# Patient Record
Sex: Male | Born: 1935 | Race: Black or African American | Hispanic: No | Smoking: Former smoker
Health system: Southern US, Community
[De-identification: ages and names within clinical notes are randomized; demographics above are authoritative.]

## PROBLEM LIST (undated history)

## (undated) DIAGNOSIS — E785 Hyperlipidemia, unspecified: Secondary | ICD-10-CM

## (undated) DIAGNOSIS — F0390 Unspecified dementia without behavioral disturbance: Secondary | ICD-10-CM

## (undated) DIAGNOSIS — IMO0002 Reserved for concepts with insufficient information to code with codable children: Secondary | ICD-10-CM

## (undated) DIAGNOSIS — K56609 Unspecified intestinal obstruction, unspecified as to partial versus complete obstruction: Secondary | ICD-10-CM

## (undated) DIAGNOSIS — N209 Urinary calculus, unspecified: Secondary | ICD-10-CM

## (undated) DIAGNOSIS — C61 Malignant neoplasm of prostate: Secondary | ICD-10-CM

## (undated) DIAGNOSIS — M1990 Unspecified osteoarthritis, unspecified site: Secondary | ICD-10-CM

## (undated) DIAGNOSIS — Z9889 Other specified postprocedural states: Secondary | ICD-10-CM

## (undated) DIAGNOSIS — I10 Essential (primary) hypertension: Secondary | ICD-10-CM

## (undated) DIAGNOSIS — J189 Pneumonia, unspecified organism: Secondary | ICD-10-CM

## (undated) DIAGNOSIS — C189 Malignant neoplasm of colon, unspecified: Secondary | ICD-10-CM

## (undated) DIAGNOSIS — I5022 Chronic systolic (congestive) heart failure: Secondary | ICD-10-CM

## (undated) DIAGNOSIS — F418 Other specified anxiety disorders: Secondary | ICD-10-CM

## (undated) DIAGNOSIS — H9190 Unspecified hearing loss, unspecified ear: Secondary | ICD-10-CM

## (undated) HISTORY — DX: Hyperlipidemia, unspecified: E78.5

## (undated) HISTORY — PX: CATARACT EXTRACTION, BILATERAL: SHX1313

## (undated) HISTORY — DX: Urinary calculus, unspecified: N20.9

## (undated) HISTORY — DX: Other specified anxiety disorders: F41.8

## (undated) HISTORY — PX: TRANSURETHRAL RESECTION OF PROSTATE: SHX73

## (undated) HISTORY — DX: Unspecified dementia, unspecified severity, without behavioral disturbance, psychotic disturbance, mood disturbance, and anxiety: F03.90

## (undated) HISTORY — DX: Unspecified osteoarthritis, unspecified site: M19.90

## (undated) HISTORY — DX: Unspecified hearing loss, unspecified ear: H91.90

## (undated) HISTORY — DX: Malignant neoplasm of prostate: C61

## (undated) HISTORY — DX: Reserved for concepts with insufficient information to code with codable children: IMO0002

## (undated) HISTORY — DX: Other specified postprocedural states: Z98.890

## (undated) HISTORY — DX: Malignant neoplasm of colon, unspecified: C18.9

## (undated) HISTORY — DX: Essential (primary) hypertension: I10

## (undated) HISTORY — DX: Chronic systolic (congestive) heart failure: I50.22

## (undated) HISTORY — PX: OTHER SURGICAL HISTORY: SHX169

---

## 1999-09-21 ENCOUNTER — Encounter: Admission: RE | Admit: 1999-09-21 | Discharge: 1999-09-21 | Payer: Self-pay | Admitting: Urology

## 1999-09-21 ENCOUNTER — Encounter: Payer: Self-pay | Admitting: Urology

## 1999-10-02 ENCOUNTER — Encounter: Admission: RE | Admit: 1999-10-02 | Discharge: 1999-12-31 | Payer: Self-pay | Admitting: Radiation Oncology

## 2002-07-21 ENCOUNTER — Encounter: Admission: RE | Admit: 2002-07-21 | Discharge: 2002-07-21 | Payer: Self-pay | Admitting: Internal Medicine

## 2002-07-21 ENCOUNTER — Encounter: Payer: Self-pay | Admitting: Internal Medicine

## 2004-03-02 ENCOUNTER — Encounter: Admission: RE | Admit: 2004-03-02 | Discharge: 2004-03-02 | Payer: Self-pay | Admitting: Urology

## 2004-03-07 ENCOUNTER — Ambulatory Visit (HOSPITAL_BASED_OUTPATIENT_CLINIC_OR_DEPARTMENT_OTHER): Admission: RE | Admit: 2004-03-07 | Discharge: 2004-03-07 | Payer: Self-pay | Admitting: Urology

## 2004-03-07 ENCOUNTER — Ambulatory Visit (HOSPITAL_COMMUNITY): Admission: RE | Admit: 2004-03-07 | Discharge: 2004-03-07 | Payer: Self-pay | Admitting: Urology

## 2005-03-06 ENCOUNTER — Encounter: Admission: RE | Admit: 2005-03-06 | Discharge: 2005-03-06 | Payer: Self-pay | Admitting: Gastroenterology

## 2005-03-08 ENCOUNTER — Emergency Department (HOSPITAL_COMMUNITY): Admission: EM | Admit: 2005-03-08 | Discharge: 2005-03-08 | Payer: Self-pay | Admitting: Emergency Medicine

## 2005-03-15 ENCOUNTER — Ambulatory Visit: Payer: Self-pay | Admitting: Oncology

## 2005-03-16 ENCOUNTER — Ambulatory Visit (HOSPITAL_COMMUNITY): Admission: RE | Admit: 2005-03-16 | Discharge: 2005-03-16 | Payer: Self-pay | Admitting: Gastroenterology

## 2005-03-30 HISTORY — PX: HEMICOLECTOMY: SHX854

## 2005-04-03 ENCOUNTER — Encounter (INDEPENDENT_AMBULATORY_CARE_PROVIDER_SITE_OTHER): Payer: Self-pay | Admitting: *Deleted

## 2005-04-03 ENCOUNTER — Inpatient Hospital Stay (HOSPITAL_COMMUNITY): Admission: RE | Admit: 2005-04-03 | Discharge: 2005-04-11 | Payer: Self-pay | Admitting: *Deleted

## 2005-04-16 ENCOUNTER — Ambulatory Visit (HOSPITAL_COMMUNITY): Admission: RE | Admit: 2005-04-16 | Discharge: 2005-04-16 | Payer: Self-pay | Admitting: Family Medicine

## 2005-05-17 ENCOUNTER — Ambulatory Visit: Payer: Self-pay | Admitting: Oncology

## 2005-07-30 ENCOUNTER — Ambulatory Visit (HOSPITAL_COMMUNITY): Admission: RE | Admit: 2005-07-30 | Discharge: 2005-07-30 | Payer: Self-pay | Admitting: Oncology

## 2005-08-09 ENCOUNTER — Ambulatory Visit: Payer: Self-pay | Admitting: Oncology

## 2005-08-10 LAB — COMPREHENSIVE METABOLIC PANEL
AST: 31 U/L (ref 0–37)
Albumin: 4 g/dL (ref 3.5–5.2)
BUN: 19 mg/dL (ref 6–23)
Calcium: 9 mg/dL (ref 8.4–10.5)
Chloride: 107 mEq/L (ref 96–112)
Potassium: 4.6 mEq/L (ref 3.5–5.3)

## 2005-08-10 LAB — CBC WITH DIFFERENTIAL/PLATELET
Basophils Absolute: 0 10*3/uL (ref 0.0–0.1)
EOS%: 6.3 % (ref 0.0–7.0)
Eosinophils Absolute: 0.3 10*3/uL (ref 0.0–0.5)
HGB: 13.5 g/dL (ref 13.0–17.1)
MCH: 27.1 pg — ABNORMAL LOW (ref 28.0–33.4)
NEUT#: 3.2 10*3/uL (ref 1.5–6.5)
RDW: 20.5 % — ABNORMAL HIGH (ref 11.2–14.6)
WBC: 5 10*3/uL (ref 4.0–10.0)
lymph#: 0.7 10*3/uL — ABNORMAL LOW (ref 0.9–3.3)

## 2005-11-02 ENCOUNTER — Ambulatory Visit: Payer: Self-pay | Admitting: Oncology

## 2005-11-09 LAB — CBC WITH DIFFERENTIAL/PLATELET
Basophils Absolute: 0.1 10*3/uL (ref 0.0–0.1)
EOS%: 6 % (ref 0.0–7.0)
Eosinophils Absolute: 0.2 10*3/uL (ref 0.0–0.5)
HGB: 13.9 g/dL (ref 13.0–17.1)
MCH: 28.9 pg (ref 28.0–33.4)
MCV: 85.8 fL (ref 81.6–98.0)
MONO%: 14.4 % — ABNORMAL HIGH (ref 0.0–13.0)
NEUT#: 2 10*3/uL (ref 1.5–6.5)
RBC: 4.8 10*6/uL (ref 4.20–5.71)
RDW: 15.3 % — ABNORMAL HIGH (ref 11.2–14.6)
lymph#: 0.9 10*3/uL (ref 0.9–3.3)

## 2005-11-09 LAB — COMPREHENSIVE METABOLIC PANEL
Albumin: 3.9 g/dL (ref 3.5–5.2)
BUN: 16 mg/dL (ref 6–23)
CO2: 23 mEq/L (ref 19–32)
Calcium: 8.1 mg/dL — ABNORMAL LOW (ref 8.4–10.5)
Chloride: 111 mEq/L (ref 96–112)
Glucose, Bld: 93 mg/dL (ref 70–99)
Potassium: 4.1 mEq/L (ref 3.5–5.3)

## 2006-02-12 ENCOUNTER — Ambulatory Visit (HOSPITAL_COMMUNITY): Admission: RE | Admit: 2006-02-12 | Discharge: 2006-02-12 | Payer: Self-pay | Admitting: Oncology

## 2006-02-25 ENCOUNTER — Ambulatory Visit: Payer: Self-pay | Admitting: Oncology

## 2006-03-07 LAB — COMPREHENSIVE METABOLIC PANEL
AST: 38 U/L — ABNORMAL HIGH (ref 0–37)
Albumin: 3.8 g/dL (ref 3.5–5.2)
BUN: 18 mg/dL (ref 6–23)
CO2: 23 mEq/L (ref 19–32)
Calcium: 9 mg/dL (ref 8.4–10.5)
Chloride: 109 mEq/L (ref 96–112)
Creatinine, Ser: 0.94 mg/dL (ref 0.40–1.50)
Glucose, Bld: 91 mg/dL (ref 70–99)
Potassium: 4.1 mEq/L (ref 3.5–5.3)

## 2006-03-07 LAB — CBC WITH DIFFERENTIAL/PLATELET
Basophils Absolute: 0 10*3/uL (ref 0.0–0.1)
Eosinophils Absolute: 0.3 10*3/uL (ref 0.0–0.5)
HCT: 42.9 % (ref 38.7–49.9)
HGB: 14.6 g/dL (ref 13.0–17.1)
MCH: 30 pg (ref 28.0–33.4)
MONO#: 0.8 10*3/uL (ref 0.1–0.9)
NEUT#: 2.6 10*3/uL (ref 1.5–6.5)
NEUT%: 58.4 % (ref 40.0–75.0)
RDW: 15.4 % — ABNORMAL HIGH (ref 11.2–14.6)
WBC: 4.4 10*3/uL (ref 4.0–10.0)
lymph#: 0.7 10*3/uL — ABNORMAL LOW (ref 0.9–3.3)

## 2006-03-07 LAB — CEA: CEA: <0.5 ng/mL (ref 0.0–5.0)

## 2006-03-26 ENCOUNTER — Encounter: Payer: Self-pay | Admitting: Internal Medicine

## 2006-05-13 ENCOUNTER — Ambulatory Visit: Payer: Self-pay | Admitting: Internal Medicine

## 2006-05-13 DIAGNOSIS — I10 Essential (primary) hypertension: Secondary | ICD-10-CM

## 2006-05-13 DIAGNOSIS — E1165 Type 2 diabetes mellitus with hyperglycemia: Secondary | ICD-10-CM

## 2006-05-13 DIAGNOSIS — Z87442 Personal history of urinary calculi: Secondary | ICD-10-CM

## 2006-05-13 DIAGNOSIS — C61 Malignant neoplasm of prostate: Secondary | ICD-10-CM

## 2006-05-13 DIAGNOSIS — E785 Hyperlipidemia, unspecified: Secondary | ICD-10-CM

## 2006-05-13 DIAGNOSIS — C189 Malignant neoplasm of colon, unspecified: Secondary | ICD-10-CM | POA: Insufficient documentation

## 2006-06-06 ENCOUNTER — Ambulatory Visit: Payer: Self-pay | Admitting: Oncology

## 2006-06-11 ENCOUNTER — Encounter: Payer: Self-pay | Admitting: Internal Medicine

## 2006-06-11 LAB — COMPREHENSIVE METABOLIC PANEL
ALT: 43 U/L (ref 0–53)
AST: 32 U/L (ref 0–37)
Albumin: 3.9 g/dL (ref 3.5–5.2)
Alkaline Phosphatase: 139 U/L — ABNORMAL HIGH (ref 39–117)
BUN: 19 mg/dL (ref 6–23)
CO2: 25 mEq/L (ref 19–32)
Calcium: 8.7 mg/dL (ref 8.4–10.5)
Chloride: 107 mEq/L (ref 96–112)
Creatinine, Ser: 0.91 mg/dL (ref 0.40–1.50)
Glucose, Bld: 121 mg/dL — ABNORMAL HIGH (ref 70–99)
Potassium: 4.4 mEq/L (ref 3.5–5.3)
Sodium: 140 mEq/L (ref 135–145)
Total Bilirubin: 0.5 mg/dL (ref 0.3–1.2)
Total Protein: 6.5 g/dL (ref 6.0–8.3)

## 2006-06-11 LAB — CBC WITH DIFFERENTIAL/PLATELET
BASO%: 1.2 % (ref 0.0–2.0)
Basophils Absolute: 0 10*3/uL (ref 0.0–0.1)
EOS%: 6.2 % (ref 0.0–7.0)
Eosinophils Absolute: 0.2 10*3/uL (ref 0.0–0.5)
HCT: 42.9 % (ref 38.7–49.9)
HGB: 14.5 g/dL (ref 13.0–17.1)
LYMPH%: 15.3 % (ref 14.0–48.0)
MCH: 29.5 pg (ref 28.0–33.4)
MCHC: 33.8 g/dL (ref 32.0–35.9)
MCV: 87.2 fL (ref 81.6–98.0)
MONO#: 0.6 10*3/uL (ref 0.1–0.9)
MONO%: 14.7 % — ABNORMAL HIGH (ref 0.0–13.0)
NEUT#: 2.4 10*3/uL (ref 1.5–6.5)
NEUT%: 62.7 % (ref 40.0–75.0)
Platelets: 196 10*3/uL (ref 145–400)
RBC: 4.93 10*6/uL (ref 4.20–5.71)
RDW: 12.7 % (ref 11.2–14.6)
WBC: 3.8 10*3/uL — ABNORMAL LOW (ref 4.0–10.0)
lymph#: 0.6 10*3/uL — ABNORMAL LOW (ref 0.9–3.3)

## 2006-06-11 LAB — CEA: CEA: 0.8 ng/mL (ref 0.0–5.0)

## 2006-07-12 ENCOUNTER — Ambulatory Visit: Payer: Self-pay | Admitting: Internal Medicine

## 2006-07-12 LAB — CONVERTED CEMR LAB
ALT: 36 units/L (ref 0–40)
AST: 36 units/L (ref 0–37)
BUN: 17 mg/dL (ref 6–23)
Basophils Absolute: 0 10*3/uL (ref 0.0–0.1)
Basophils Relative: 0.1 % (ref 0.0–1.0)
Calcium: 8.5 mg/dL (ref 8.4–10.5)
Creatinine, Ser: 0.9 mg/dL (ref 0.4–1.5)
Creatinine,U: 160.8 mg/dL
Eosinophils Absolute: 0.3 10*3/uL (ref 0.0–0.6)
Eosinophils Relative: 7.5 % — ABNORMAL HIGH (ref 0.0–5.0)
HCT: 42.2 % (ref 39.0–52.0)
Hemoglobin: 14.2 g/dL (ref 13.0–17.0)
Hgb A1c MFr Bld: 7 % — ABNORMAL HIGH (ref 4.6–6.0)
Lymphocytes Relative: 15.8 % (ref 12.0–46.0)
MCHC: 33.7 g/dL (ref 30.0–36.0)
MCV: 88.2 fL (ref 78.0–100.0)
Microalb Creat Ratio: 26.7 mg/g (ref 0.0–30.0)
Microalb, Ur: 4.3 mg/dL — ABNORMAL HIGH (ref 0.0–1.9)
Monocytes Absolute: 0.7 10*3/uL (ref 0.2–0.7)
Monocytes Relative: 14.6 % — ABNORMAL HIGH (ref 3.0–11.0)
Neutro Abs: 2.9 10*3/uL (ref 1.4–7.7)
Neutrophils Relative %: 62 % (ref 43.0–77.0)
PSA: 0.24 ng/mL (ref 0.10–4.00)
Platelets: 205 10*3/uL (ref 150–400)
Potassium: 4 meq/L (ref 3.5–5.1)
RBC: 4.79 M/uL (ref 4.22–5.81)
RDW: 14 % (ref 11.5–14.6)
WBC: 4.6 10*3/uL (ref 4.5–10.5)

## 2006-09-05 ENCOUNTER — Ambulatory Visit (HOSPITAL_COMMUNITY): Admission: RE | Admit: 2006-09-05 | Discharge: 2006-09-05 | Payer: Self-pay | Admitting: Oncology

## 2006-09-18 ENCOUNTER — Ambulatory Visit: Payer: Self-pay | Admitting: Oncology

## 2006-09-20 ENCOUNTER — Encounter: Payer: Self-pay | Admitting: Internal Medicine

## 2006-09-20 LAB — COMPREHENSIVE METABOLIC PANEL
ALT: 45 U/L (ref 0–53)
Albumin: 4 g/dL (ref 3.5–5.2)
CO2: 23 mEq/L (ref 19–32)
Chloride: 110 mEq/L (ref 96–112)
Potassium: 4.1 mEq/L (ref 3.5–5.3)
Sodium: 143 mEq/L (ref 135–145)
Total Bilirubin: 0.5 mg/dL (ref 0.3–1.2)
Total Protein: 6.6 g/dL (ref 6.0–8.3)

## 2006-09-20 LAB — CBC WITH DIFFERENTIAL/PLATELET
BASO%: 0.4 % (ref 0.0–2.0)
LYMPH%: 16.5 % (ref 14.0–48.0)
MCHC: 34.6 g/dL (ref 32.0–35.9)
MONO#: 0.7 10*3/uL (ref 0.1–0.9)
NEUT#: 2.5 10*3/uL (ref 1.5–6.5)
RBC: 4.81 10*6/uL (ref 4.20–5.71)
RDW: 14.4 % (ref 11.2–14.6)
WBC: 4.3 10*3/uL (ref 4.0–10.0)
lymph#: 0.7 10*3/uL — ABNORMAL LOW (ref 0.9–3.3)

## 2006-09-20 LAB — CEA: CEA: 0.8 ng/mL (ref 0.0–5.0)

## 2006-10-21 ENCOUNTER — Ambulatory Visit: Payer: Self-pay | Admitting: Internal Medicine

## 2006-10-22 ENCOUNTER — Ambulatory Visit: Payer: Self-pay | Admitting: Internal Medicine

## 2006-10-25 ENCOUNTER — Encounter (INDEPENDENT_AMBULATORY_CARE_PROVIDER_SITE_OTHER): Payer: Self-pay | Admitting: *Deleted

## 2006-10-25 LAB — CONVERTED CEMR LAB
ALT: 53 units/L — ABNORMAL HIGH (ref 0–40)
AST: 40 units/L — ABNORMAL HIGH (ref 0–37)
Albumin: 3.3 g/dL — ABNORMAL LOW (ref 3.5–5.2)
Alkaline Phosphatase: 135 units/L — ABNORMAL HIGH (ref 39–117)
Bilirubin, Direct: 0.1 mg/dL (ref 0.0–0.3)
Cholesterol: 102 mg/dL (ref 0–200)
Creatinine,U: 130.1 mg/dL
HDL: 38.9 mg/dL — ABNORMAL LOW (ref >39.0)
Hgb A1c MFr Bld: 7.6 % — ABNORMAL HIGH (ref 4.6–6.0)
LDL Cholesterol: 55 mg/dL (ref 0–99)
Microalb Creat Ratio: 55.3 mg/g — ABNORMAL HIGH (ref 0.0–30.0)
Microalb, Ur: 7.2 mg/dL — ABNORMAL HIGH (ref 0.0–1.9)
Total Bilirubin: 0.6 mg/dL (ref 0.3–1.2)
Total CHOL/HDL Ratio: 2.6
Total Protein: 6.4 g/dL (ref 6.0–8.3)
Triglycerides: 42 mg/dL (ref 0–149)
VLDL: 8 mg/dL (ref 0–40)

## 2006-11-04 ENCOUNTER — Encounter: Payer: Self-pay | Admitting: Internal Medicine

## 2006-11-04 ENCOUNTER — Ambulatory Visit: Payer: Self-pay | Admitting: Gastroenterology

## 2006-11-07 ENCOUNTER — Encounter (INDEPENDENT_AMBULATORY_CARE_PROVIDER_SITE_OTHER): Payer: Self-pay | Admitting: *Deleted

## 2006-12-09 ENCOUNTER — Ambulatory Visit: Payer: Self-pay | Admitting: Oncology

## 2006-12-20 ENCOUNTER — Ambulatory Visit: Payer: Self-pay | Admitting: Internal Medicine

## 2006-12-20 DIAGNOSIS — R945 Abnormal results of liver function studies: Secondary | ICD-10-CM

## 2006-12-24 LAB — CONVERTED CEMR LAB
ALT: 36 units/L (ref 0–53)
AST: 38 units/L — ABNORMAL HIGH (ref 0–37)
Albumin: 3.6 g/dL (ref 3.5–5.2)
Alkaline Phosphatase: 146 units/L — ABNORMAL HIGH (ref 39–117)
BUN: 20 mg/dL (ref 6–23)
Bilirubin, Direct: 0.3 mg/dL (ref 0.0–0.3)
CO2: 26 meq/L (ref 19–32)
Calcium: 9 mg/dL (ref 8.4–10.5)
Chloride: 108 meq/L (ref 96–112)
Creatinine, Ser: 0.9 mg/dL (ref 0.4–1.5)
GFR calc Af Amer: 104 mL/min
GFR calc non Af Amer: 86 mL/min
Glucose, Bld: 114 mg/dL — ABNORMAL HIGH (ref 70–99)
Potassium: 5.2 meq/L — ABNORMAL HIGH (ref 3.5–5.1)
Sodium: 141 meq/L (ref 135–145)
Total Bilirubin: 1 mg/dL (ref 0.3–1.2)
Total Protein: 6.6 g/dL (ref 6.0–8.3)

## 2007-01-28 ENCOUNTER — Encounter: Payer: Self-pay | Admitting: Internal Medicine

## 2007-02-24 ENCOUNTER — Ambulatory Visit: Payer: Self-pay | Admitting: Internal Medicine

## 2007-02-27 ENCOUNTER — Ambulatory Visit: Payer: Self-pay | Admitting: Internal Medicine

## 2007-02-27 ENCOUNTER — Telehealth: Payer: Self-pay | Admitting: Internal Medicine

## 2007-03-11 ENCOUNTER — Ambulatory Visit: Payer: Self-pay | Admitting: Internal Medicine

## 2007-03-13 ENCOUNTER — Ambulatory Visit: Payer: Self-pay | Admitting: Internal Medicine

## 2007-03-17 LAB — CONVERTED CEMR LAB
Basophils Absolute: 0 10*3/uL (ref 0.0–0.1)
Basophils Relative: 0.4 % (ref 0.0–1.0)
Eosinophils Absolute: 0.2 10*3/uL (ref 0.0–0.6)
Eosinophils Relative: 4.1 % (ref 0.0–5.0)
HCT: 41 % (ref 39.0–52.0)
Hemoglobin: 13.7 g/dL (ref 13.0–17.0)
Lymphocytes Relative: 13.1 % (ref 12.0–46.0)
MCHC: 33.5 g/dL (ref 30.0–36.0)
MCV: 87.9 fL (ref 78.0–100.0)
Monocytes Absolute: 0.7 10*3/uL (ref 0.2–0.7)
Monocytes Relative: 13.6 % — ABNORMAL HIGH (ref 3.0–11.0)
Neutro Abs: 3.9 10*3/uL (ref 1.4–7.7)
Neutrophils Relative %: 68.8 % (ref 43.0–77.0)
Platelets: 275 10*3/uL (ref 150–400)
RBC: 4.66 M/uL (ref 4.22–5.81)
RDW: 13.7 % (ref 11.5–14.6)
Sed Rate: 10 mm/hr (ref 0–20)
Uric Acid, Serum: 4.7 mg/dL (ref 2.4–7.0)
WBC: 5.4 10*3/uL (ref 4.5–10.5)

## 2007-03-28 ENCOUNTER — Encounter: Payer: Self-pay | Admitting: Internal Medicine

## 2007-04-02 ENCOUNTER — Telehealth (INDEPENDENT_AMBULATORY_CARE_PROVIDER_SITE_OTHER): Payer: Self-pay | Admitting: *Deleted

## 2007-05-13 ENCOUNTER — Ambulatory Visit: Payer: Self-pay | Admitting: Internal Medicine

## 2007-05-16 LAB — CONVERTED CEMR LAB
ALT: 28 units/L (ref 0–53)
AST: 33 units/L (ref 0–37)
Albumin: 3.4 g/dL — ABNORMAL LOW (ref 3.5–5.2)
Alkaline Phosphatase: 164 units/L — ABNORMAL HIGH (ref 39–117)
BUN: 20 mg/dL (ref 6–23)
Bilirubin, Direct: 0.1 mg/dL (ref 0.0–0.3)
CO2: 24 meq/L (ref 19–32)
Calcium: 8.9 mg/dL (ref 8.4–10.5)
Chloride: 109 meq/L (ref 96–112)
Cholesterol: 86 mg/dL (ref 0–200)
Creatinine, Ser: 0.9 mg/dL (ref 0.4–1.5)
Creatinine,U: 143.9 mg/dL
GFR calc Af Amer: 104 mL/min
GFR calc non Af Amer: 86 mL/min
Glucose, Bld: 119 mg/dL — ABNORMAL HIGH (ref 70–99)
HDL: 30.9 mg/dL — ABNORMAL LOW (ref >39.0)
Hgb A1c MFr Bld: 7 % — ABNORMAL HIGH (ref 4.6–6.0)
LDL Cholesterol: 43 mg/dL (ref 0–99)
Microalb Creat Ratio: 35.4 mg/g — ABNORMAL HIGH (ref 0.0–30.0)
Microalb, Ur: 5.1 mg/dL — ABNORMAL HIGH (ref 0.0–1.9)
Potassium: 4.1 meq/L (ref 3.5–5.1)
Sodium: 142 meq/L (ref 135–145)
Total Bilirubin: 0.7 mg/dL (ref 0.3–1.2)
Total CHOL/HDL Ratio: 2.8
Total Protein: 6.5 g/dL (ref 6.0–8.3)
Triglycerides: 63 mg/dL (ref 0–149)
VLDL: 13 mg/dL (ref 0–40)

## 2007-09-15 ENCOUNTER — Ambulatory Visit: Payer: Self-pay | Admitting: Internal Medicine

## 2007-10-08 ENCOUNTER — Telehealth (INDEPENDENT_AMBULATORY_CARE_PROVIDER_SITE_OTHER): Payer: Self-pay | Admitting: *Deleted

## 2007-10-14 ENCOUNTER — Ambulatory Visit: Payer: Self-pay | Admitting: Internal Medicine

## 2007-10-20 ENCOUNTER — Telehealth (INDEPENDENT_AMBULATORY_CARE_PROVIDER_SITE_OTHER): Payer: Self-pay | Admitting: *Deleted

## 2007-10-20 LAB — CONVERTED CEMR LAB
ALT: 36 units/L (ref 0–53)
AST: 32 units/L (ref 0–37)
BUN: 18 mg/dL (ref 6–23)
CO2: 26 meq/L (ref 19–32)
Calcium: 8.6 mg/dL (ref 8.4–10.5)
Chloride: 110 meq/L (ref 96–112)
Cholesterol: 101 mg/dL (ref 0–200)
Creatinine, Ser: 0.9 mg/dL (ref 0.4–1.5)
GFR calc Af Amer: 104 mL/min
GFR calc non Af Amer: 86 mL/min
Glucose, Bld: 142 mg/dL — ABNORMAL HIGH (ref 70–99)
Hgb A1c MFr Bld: 6.9 % — ABNORMAL HIGH (ref 4.6–6.0)
Potassium: 4.1 meq/L (ref 3.5–5.1)
Sodium: 141 meq/L (ref 135–145)

## 2007-12-15 ENCOUNTER — Ambulatory Visit: Payer: Self-pay | Admitting: Internal Medicine

## 2008-01-12 ENCOUNTER — Ambulatory Visit: Payer: Self-pay | Admitting: Internal Medicine

## 2008-01-12 DIAGNOSIS — F411 Generalized anxiety disorder: Secondary | ICD-10-CM | POA: Insufficient documentation

## 2008-01-13 ENCOUNTER — Telehealth (INDEPENDENT_AMBULATORY_CARE_PROVIDER_SITE_OTHER): Payer: Self-pay | Admitting: *Deleted

## 2008-01-13 ENCOUNTER — Ambulatory Visit: Payer: Self-pay | Admitting: Internal Medicine

## 2008-01-15 ENCOUNTER — Encounter: Payer: Self-pay | Admitting: Internal Medicine

## 2008-01-16 ENCOUNTER — Encounter (INDEPENDENT_AMBULATORY_CARE_PROVIDER_SITE_OTHER): Payer: Self-pay | Admitting: *Deleted

## 2008-01-23 ENCOUNTER — Telehealth: Payer: Self-pay | Admitting: Internal Medicine

## 2008-01-30 ENCOUNTER — Encounter: Payer: Self-pay | Admitting: Internal Medicine

## 2008-02-02 ENCOUNTER — Ambulatory Visit: Payer: Self-pay | Admitting: Internal Medicine

## 2008-02-06 ENCOUNTER — Telehealth: Payer: Self-pay | Admitting: Internal Medicine

## 2008-02-06 LAB — CONVERTED CEMR LAB
Hgb A1c MFr Bld: 6.6 % — ABNORMAL HIGH (ref 4.6–6.0)
Lymphocytes Relative: 11.2 % — ABNORMAL LOW (ref 12.0–46.0)
Monocytes Relative: 10.1 % (ref 3.0–12.0)
Platelets: 221 10*3/uL (ref 150–400)
RDW: 14.1 % (ref 11.5–14.6)
TSH: 1.37 microintl units/mL (ref 0.35–5.50)

## 2008-03-03 ENCOUNTER — Telehealth: Payer: Self-pay | Admitting: Internal Medicine

## 2008-03-10 ENCOUNTER — Encounter: Payer: Self-pay | Admitting: Internal Medicine

## 2008-03-11 ENCOUNTER — Telehealth: Payer: Self-pay | Admitting: Internal Medicine

## 2008-04-26 ENCOUNTER — Encounter (INDEPENDENT_AMBULATORY_CARE_PROVIDER_SITE_OTHER): Payer: Self-pay | Admitting: *Deleted

## 2008-05-11 ENCOUNTER — Encounter: Admission: RE | Admit: 2008-05-11 | Discharge: 2008-05-11 | Payer: Self-pay | Admitting: Neurology

## 2008-05-19 ENCOUNTER — Telehealth (INDEPENDENT_AMBULATORY_CARE_PROVIDER_SITE_OTHER): Payer: Self-pay | Admitting: *Deleted

## 2008-05-28 ENCOUNTER — Encounter: Payer: Self-pay | Admitting: Internal Medicine

## 2008-06-01 ENCOUNTER — Telehealth (INDEPENDENT_AMBULATORY_CARE_PROVIDER_SITE_OTHER): Payer: Self-pay | Admitting: *Deleted

## 2008-06-22 ENCOUNTER — Ambulatory Visit: Payer: Self-pay | Admitting: Internal Medicine

## 2008-06-29 ENCOUNTER — Telehealth (INDEPENDENT_AMBULATORY_CARE_PROVIDER_SITE_OTHER): Payer: Self-pay | Admitting: *Deleted

## 2008-06-30 ENCOUNTER — Ambulatory Visit: Payer: Self-pay | Admitting: Internal Medicine

## 2008-07-02 ENCOUNTER — Telehealth (INDEPENDENT_AMBULATORY_CARE_PROVIDER_SITE_OTHER): Payer: Self-pay | Admitting: *Deleted

## 2008-07-02 LAB — CONVERTED CEMR LAB
AST: 48 units/L — ABNORMAL HIGH (ref 0–37)
CO2: 28 meq/L (ref 19–32)
Calcium: 8.8 mg/dL (ref 8.4–10.5)
Chloride: 107 meq/L (ref 96–112)
Glucose, Bld: 118 mg/dL — ABNORMAL HIGH (ref 70–99)
HDL: 51.8 mg/dL (ref >39.0)
PSA: 0.39 ng/mL (ref 0.10–4.00)
Sodium: 141 meq/L (ref 135–145)
Triglycerides: 53 mg/dL (ref 0–149)

## 2008-07-16 ENCOUNTER — Ambulatory Visit: Payer: Self-pay | Admitting: Internal Medicine

## 2008-07-16 DIAGNOSIS — R32 Unspecified urinary incontinence: Secondary | ICD-10-CM

## 2008-07-16 LAB — CONVERTED CEMR LAB
Bilirubin Urine: NEGATIVE
Glucose, Urine, Semiquant: NEGATIVE
Protein, U semiquant: NEGATIVE
Specific Gravity, Urine: 1.01
pH: 5

## 2008-07-17 ENCOUNTER — Encounter: Payer: Self-pay | Admitting: Internal Medicine

## 2008-07-22 ENCOUNTER — Telehealth: Payer: Self-pay | Admitting: Internal Medicine

## 2008-07-26 ENCOUNTER — Telehealth (INDEPENDENT_AMBULATORY_CARE_PROVIDER_SITE_OTHER): Payer: Self-pay | Admitting: *Deleted

## 2008-08-02 ENCOUNTER — Telehealth: Payer: Self-pay | Admitting: Internal Medicine

## 2008-08-03 ENCOUNTER — Ambulatory Visit: Payer: Self-pay | Admitting: Internal Medicine

## 2008-08-03 DIAGNOSIS — F068 Other specified mental disorders due to known physiological condition: Secondary | ICD-10-CM

## 2008-08-03 LAB — CONVERTED CEMR LAB
Ketones, urine, test strip: NEGATIVE
Nitrite: NEGATIVE
Specific Gravity, Urine: 1.01
Urobilinogen, UA: 0.2
pH: 7

## 2008-08-04 ENCOUNTER — Encounter: Payer: Self-pay | Admitting: Internal Medicine

## 2008-08-05 ENCOUNTER — Telehealth (INDEPENDENT_AMBULATORY_CARE_PROVIDER_SITE_OTHER): Payer: Self-pay | Admitting: *Deleted

## 2008-08-05 LAB — CONVERTED CEMR LAB
Albumin: 3.6 g/dL (ref 3.5–5.2)
Total Bilirubin: 0.8 mg/dL (ref 0.3–1.2)
WBC, UA: NONE SEEN cells/hpf (ref <3)

## 2008-08-09 ENCOUNTER — Telehealth (INDEPENDENT_AMBULATORY_CARE_PROVIDER_SITE_OTHER): Payer: Self-pay | Admitting: *Deleted

## 2008-08-10 ENCOUNTER — Telehealth: Payer: Self-pay | Admitting: Internal Medicine

## 2008-08-11 ENCOUNTER — Telehealth: Payer: Self-pay | Admitting: Internal Medicine

## 2008-08-13 ENCOUNTER — Encounter: Payer: Self-pay | Admitting: Internal Medicine

## 2008-08-16 ENCOUNTER — Ambulatory Visit: Payer: Self-pay | Admitting: Internal Medicine

## 2008-08-18 ENCOUNTER — Ambulatory Visit: Payer: Self-pay | Admitting: Internal Medicine

## 2008-08-18 ENCOUNTER — Telehealth: Payer: Self-pay | Admitting: Internal Medicine

## 2008-08-19 ENCOUNTER — Encounter (INDEPENDENT_AMBULATORY_CARE_PROVIDER_SITE_OTHER): Payer: Self-pay | Admitting: *Deleted

## 2008-08-19 LAB — CONVERTED CEMR LAB
Basophils Absolute: 0 10*3/uL (ref 0.0–0.1)
CO2: 29 meq/L (ref 19–32)
Calcium: 8.4 mg/dL (ref 8.4–10.5)
Eosinophils Relative: 7.1 % — ABNORMAL HIGH (ref 0.0–5.0)
GFR calc non Af Amer: 103.64 mL/min (ref >60)
Glucose, Bld: 94 mg/dL (ref 70–99)
Monocytes Relative: 13.4 % — ABNORMAL HIGH (ref 3.0–12.0)
Neutrophils Relative %: 62 % (ref 43.0–77.0)
Platelets: 193 10*3/uL (ref 150.0–400.0)
Potassium: 4.1 meq/L (ref 3.5–5.1)
RDW: 14 % (ref 11.5–14.6)
Sodium: 141 meq/L (ref 135–145)
WBC: 3.3 10*3/uL — ABNORMAL LOW (ref 4.5–10.5)

## 2008-08-24 ENCOUNTER — Telehealth (INDEPENDENT_AMBULATORY_CARE_PROVIDER_SITE_OTHER): Payer: Self-pay | Admitting: *Deleted

## 2008-08-26 ENCOUNTER — Telehealth: Payer: Self-pay | Admitting: Internal Medicine

## 2008-08-30 ENCOUNTER — Encounter: Payer: Self-pay | Admitting: Internal Medicine

## 2008-09-03 ENCOUNTER — Encounter: Payer: Self-pay | Admitting: Internal Medicine

## 2008-09-07 ENCOUNTER — Encounter: Payer: Self-pay | Admitting: Internal Medicine

## 2008-09-07 ENCOUNTER — Ambulatory Visit: Payer: Self-pay | Admitting: Internal Medicine

## 2008-09-07 ENCOUNTER — Observation Stay (HOSPITAL_COMMUNITY): Admission: EM | Admit: 2008-09-07 | Discharge: 2008-09-08 | Payer: Self-pay | Admitting: Emergency Medicine

## 2008-09-07 DIAGNOSIS — F329 Major depressive disorder, single episode, unspecified: Secondary | ICD-10-CM

## 2008-09-10 ENCOUNTER — Telehealth (INDEPENDENT_AMBULATORY_CARE_PROVIDER_SITE_OTHER): Payer: Self-pay | Admitting: *Deleted

## 2008-09-24 ENCOUNTER — Ambulatory Visit: Payer: Self-pay | Admitting: Internal Medicine

## 2008-09-27 ENCOUNTER — Encounter: Payer: Self-pay | Admitting: Internal Medicine

## 2008-09-29 ENCOUNTER — Telehealth: Payer: Self-pay | Admitting: Internal Medicine

## 2008-09-29 ENCOUNTER — Ambulatory Visit (HOSPITAL_COMMUNITY): Payer: Self-pay | Admitting: Licensed Clinical Social Worker

## 2008-10-01 ENCOUNTER — Encounter: Payer: Self-pay | Admitting: Internal Medicine

## 2008-10-15 ENCOUNTER — Encounter: Payer: Self-pay | Admitting: Internal Medicine

## 2008-10-18 ENCOUNTER — Ambulatory Visit (HOSPITAL_COMMUNITY): Payer: Self-pay | Admitting: Licensed Clinical Social Worker

## 2008-10-20 ENCOUNTER — Ambulatory Visit: Payer: Self-pay | Admitting: Internal Medicine

## 2008-11-02 ENCOUNTER — Ambulatory Visit (HOSPITAL_COMMUNITY): Payer: Self-pay | Admitting: Licensed Clinical Social Worker

## 2008-11-04 ENCOUNTER — Encounter: Payer: Self-pay | Admitting: Internal Medicine

## 2008-11-10 ENCOUNTER — Ambulatory Visit (HOSPITAL_COMMUNITY): Payer: Self-pay | Admitting: Psychiatry

## 2008-11-15 ENCOUNTER — Telehealth (INDEPENDENT_AMBULATORY_CARE_PROVIDER_SITE_OTHER): Payer: Self-pay | Admitting: *Deleted

## 2008-11-16 ENCOUNTER — Ambulatory Visit (HOSPITAL_COMMUNITY): Payer: Self-pay | Admitting: Licensed Clinical Social Worker

## 2008-11-19 ENCOUNTER — Telehealth (INDEPENDENT_AMBULATORY_CARE_PROVIDER_SITE_OTHER): Payer: Self-pay | Admitting: *Deleted

## 2008-11-29 ENCOUNTER — Ambulatory Visit (HOSPITAL_COMMUNITY): Payer: Self-pay | Admitting: Licensed Clinical Social Worker

## 2008-11-30 ENCOUNTER — Ambulatory Visit: Payer: Self-pay | Admitting: Internal Medicine

## 2008-12-09 ENCOUNTER — Telehealth: Payer: Self-pay | Admitting: Internal Medicine

## 2008-12-17 ENCOUNTER — Ambulatory Visit (HOSPITAL_COMMUNITY): Payer: Self-pay | Admitting: Psychiatry

## 2008-12-20 ENCOUNTER — Telehealth: Payer: Self-pay | Admitting: Internal Medicine

## 2008-12-29 ENCOUNTER — Ambulatory Visit (HOSPITAL_COMMUNITY): Payer: Self-pay | Admitting: Licensed Clinical Social Worker

## 2009-01-04 ENCOUNTER — Telehealth (INDEPENDENT_AMBULATORY_CARE_PROVIDER_SITE_OTHER): Payer: Self-pay | Admitting: *Deleted

## 2009-01-31 ENCOUNTER — Ambulatory Visit: Payer: Self-pay | Admitting: Internal Medicine

## 2009-01-31 ENCOUNTER — Encounter: Payer: Self-pay | Admitting: Internal Medicine

## 2009-01-31 DIAGNOSIS — I493 Ventricular premature depolarization: Secondary | ICD-10-CM

## 2009-01-31 DIAGNOSIS — R609 Edema, unspecified: Secondary | ICD-10-CM

## 2009-02-01 ENCOUNTER — Encounter: Payer: Self-pay | Admitting: Internal Medicine

## 2009-02-01 ENCOUNTER — Ambulatory Visit: Payer: Self-pay

## 2009-02-03 ENCOUNTER — Encounter (INDEPENDENT_AMBULATORY_CARE_PROVIDER_SITE_OTHER): Payer: Self-pay | Admitting: *Deleted

## 2009-02-07 ENCOUNTER — Encounter: Payer: Self-pay | Admitting: Cardiology

## 2009-02-07 ENCOUNTER — Ambulatory Visit: Payer: Self-pay

## 2009-02-08 ENCOUNTER — Ambulatory Visit (HOSPITAL_COMMUNITY): Payer: Self-pay | Admitting: Licensed Clinical Social Worker

## 2009-02-16 ENCOUNTER — Ambulatory Visit: Payer: Self-pay | Admitting: Internal Medicine

## 2009-02-16 ENCOUNTER — Telehealth (INDEPENDENT_AMBULATORY_CARE_PROVIDER_SITE_OTHER): Payer: Self-pay | Admitting: *Deleted

## 2009-02-16 DIAGNOSIS — M1990 Unspecified osteoarthritis, unspecified site: Secondary | ICD-10-CM | POA: Insufficient documentation

## 2009-02-18 ENCOUNTER — Encounter: Payer: Self-pay | Admitting: Internal Medicine

## 2009-02-20 LAB — CONVERTED CEMR LAB
BUN: 17 mg/dL (ref 6–23)
Calcium: 8.8 mg/dL (ref 8.4–10.5)
Chloride: 110 meq/L (ref 96–112)
Creatinine, Ser: 0.8 mg/dL (ref 0.4–1.5)
GFR calc non Af Amer: 118.59 mL/min (ref >60)

## 2009-02-21 ENCOUNTER — Telehealth (INDEPENDENT_AMBULATORY_CARE_PROVIDER_SITE_OTHER): Payer: Self-pay | Admitting: *Deleted

## 2009-02-23 ENCOUNTER — Telehealth (INDEPENDENT_AMBULATORY_CARE_PROVIDER_SITE_OTHER): Payer: Self-pay | Admitting: *Deleted

## 2009-02-25 ENCOUNTER — Encounter: Payer: Self-pay | Admitting: Internal Medicine

## 2009-03-02 ENCOUNTER — Ambulatory Visit: Payer: Self-pay | Admitting: Internal Medicine

## 2009-03-09 ENCOUNTER — Ambulatory Visit: Payer: Self-pay | Admitting: Internal Medicine

## 2009-03-21 ENCOUNTER — Ambulatory Visit (HOSPITAL_COMMUNITY): Payer: Self-pay | Admitting: Psychiatry

## 2009-04-08 ENCOUNTER — Ambulatory Visit: Payer: Self-pay | Admitting: Internal Medicine

## 2009-04-11 ENCOUNTER — Encounter: Payer: Self-pay | Admitting: Internal Medicine

## 2009-04-19 ENCOUNTER — Encounter: Payer: Self-pay | Admitting: Internal Medicine

## 2009-04-26 ENCOUNTER — Ambulatory Visit (HOSPITAL_COMMUNITY): Admission: RE | Admit: 2009-04-26 | Discharge: 2009-04-26 | Payer: Self-pay | Admitting: Internal Medicine

## 2009-04-26 ENCOUNTER — Ambulatory Visit: Payer: Self-pay

## 2009-04-26 ENCOUNTER — Encounter: Payer: Self-pay | Admitting: Internal Medicine

## 2009-04-26 ENCOUNTER — Ambulatory Visit: Payer: Self-pay | Admitting: Cardiovascular Disease

## 2009-04-27 ENCOUNTER — Encounter: Payer: Self-pay | Admitting: Internal Medicine

## 2009-05-16 ENCOUNTER — Ambulatory Visit (HOSPITAL_COMMUNITY): Payer: Self-pay | Admitting: Psychiatry

## 2009-05-19 ENCOUNTER — Ambulatory Visit: Payer: Self-pay | Admitting: Internal Medicine

## 2009-05-21 ENCOUNTER — Encounter: Payer: Self-pay | Admitting: Internal Medicine

## 2009-05-22 LAB — CONVERTED CEMR LAB
ALT: 15 units/L (ref 0–53)
Hgb A1c MFr Bld: 5.7 % (ref 4.6–6.5)

## 2009-05-23 ENCOUNTER — Ambulatory Visit: Payer: Self-pay | Admitting: Internal Medicine

## 2009-05-26 ENCOUNTER — Telehealth (INDEPENDENT_AMBULATORY_CARE_PROVIDER_SITE_OTHER): Payer: Self-pay | Admitting: *Deleted

## 2009-05-27 ENCOUNTER — Encounter: Payer: Self-pay | Admitting: Internal Medicine

## 2009-06-02 ENCOUNTER — Encounter: Payer: Self-pay | Admitting: Internal Medicine

## 2009-06-02 ENCOUNTER — Telehealth: Payer: Self-pay | Admitting: Internal Medicine

## 2009-06-09 ENCOUNTER — Telehealth: Payer: Self-pay | Admitting: Internal Medicine

## 2009-06-24 ENCOUNTER — Encounter: Payer: Self-pay | Admitting: Internal Medicine

## 2009-06-27 ENCOUNTER — Ambulatory Visit (HOSPITAL_COMMUNITY): Payer: Self-pay | Admitting: Psychiatry

## 2009-07-05 ENCOUNTER — Telehealth (INDEPENDENT_AMBULATORY_CARE_PROVIDER_SITE_OTHER): Payer: Self-pay | Admitting: *Deleted

## 2009-07-06 ENCOUNTER — Telehealth: Payer: Self-pay | Admitting: Internal Medicine

## 2009-07-18 ENCOUNTER — Ambulatory Visit (HOSPITAL_COMMUNITY): Payer: Self-pay | Admitting: Licensed Clinical Social Worker

## 2009-07-25 ENCOUNTER — Encounter: Payer: Self-pay | Admitting: Internal Medicine

## 2009-07-28 ENCOUNTER — Encounter (INDEPENDENT_AMBULATORY_CARE_PROVIDER_SITE_OTHER): Payer: Self-pay | Admitting: *Deleted

## 2009-08-04 ENCOUNTER — Telehealth: Payer: Self-pay | Admitting: Internal Medicine

## 2009-08-15 ENCOUNTER — Ambulatory Visit (HOSPITAL_COMMUNITY): Payer: Self-pay | Admitting: Licensed Clinical Social Worker

## 2009-08-18 ENCOUNTER — Telehealth (INDEPENDENT_AMBULATORY_CARE_PROVIDER_SITE_OTHER): Payer: Self-pay | Admitting: *Deleted

## 2009-09-07 ENCOUNTER — Ambulatory Visit: Payer: Self-pay | Admitting: Internal Medicine

## 2009-09-09 LAB — CONVERTED CEMR LAB
Basophils Relative: 0.5 % (ref 0.0–3.0)
CO2: 25 meq/L (ref 19–32)
Calcium: 9 mg/dL (ref 8.4–10.5)
Creatinine, Ser: 1.2 mg/dL (ref 0.4–1.5)
Creatinine,U: 43.3 mg/dL
Eosinophils Absolute: 0.1 10*3/uL (ref 0.0–0.7)
GFR calc non Af Amer: 77.91 mL/min (ref >60)
HDL: 46.1 mg/dL (ref >39.00)
Hemoglobin: 13.4 g/dL (ref 13.0–17.0)
Hgb A1c MFr Bld: 5.7 % (ref 4.6–6.5)
LDL Cholesterol: 56 mg/dL (ref 0–99)
Lymphocytes Relative: 21 % (ref 12.0–46.0)
MCHC: 33.7 g/dL (ref 30.0–36.0)
Microalb Creat Ratio: 3.7 mg/g (ref 0.0–30.0)
Monocytes Relative: 12.9 % — ABNORMAL HIGH (ref 3.0–12.0)
Neutrophils Relative %: 63.8 % (ref 43.0–77.0)
RBC: 4.47 M/uL (ref 4.22–5.81)
Sodium: 140 meq/L (ref 135–145)
Total CHOL/HDL Ratio: 2
VLDL: 9 mg/dL (ref 0.0–40.0)
WBC: 3.5 10*3/uL — ABNORMAL LOW (ref 4.5–10.5)

## 2009-09-16 ENCOUNTER — Ambulatory Visit (HOSPITAL_COMMUNITY): Payer: Self-pay | Admitting: Psychiatry

## 2009-10-21 ENCOUNTER — Telehealth (INDEPENDENT_AMBULATORY_CARE_PROVIDER_SITE_OTHER): Payer: Self-pay | Admitting: *Deleted

## 2009-11-18 ENCOUNTER — Ambulatory Visit (HOSPITAL_COMMUNITY): Payer: Self-pay | Admitting: Psychiatry

## 2009-11-22 ENCOUNTER — Telehealth: Payer: Self-pay | Admitting: Family Medicine

## 2010-01-16 ENCOUNTER — Ambulatory Visit (HOSPITAL_COMMUNITY): Payer: Self-pay | Admitting: Psychiatry

## 2010-01-17 ENCOUNTER — Ambulatory Visit: Payer: Self-pay | Admitting: Internal Medicine

## 2010-01-23 LAB — CONVERTED CEMR LAB
Calcium: 8.7 mg/dL (ref 8.4–10.5)
Glucose, Bld: 81 mg/dL (ref 70–99)
Hgb A1c MFr Bld: 6.2 % (ref 4.6–6.5)
Potassium: 4 meq/L (ref 3.5–5.1)

## 2010-04-14 ENCOUNTER — Encounter: Payer: Self-pay | Admitting: Internal Medicine

## 2010-05-15 ENCOUNTER — Encounter: Payer: Self-pay | Admitting: Internal Medicine

## 2010-05-17 ENCOUNTER — Ambulatory Visit
Admission: RE | Admit: 2010-05-17 | Discharge: 2010-05-17 | Payer: Self-pay | Source: Home / Self Care | Attending: Internal Medicine | Admitting: Internal Medicine

## 2010-05-17 ENCOUNTER — Other Ambulatory Visit: Payer: Self-pay | Admitting: Internal Medicine

## 2010-05-17 LAB — ALT: ALT: 20 U/L (ref 0–53)

## 2010-05-17 LAB — AST: AST: 26 U/L (ref 0–37)

## 2010-05-17 LAB — HEMOGLOBIN A1C: Hgb A1c MFr Bld: 6 % (ref 4.6–6.5)

## 2010-05-21 ENCOUNTER — Encounter: Payer: Self-pay | Admitting: Oncology

## 2010-05-28 LAB — CONVERTED CEMR LAB
BUN: 23 mg/dL (ref 6–23)
CO2: 26 meq/L (ref 19–32)
Chloride: 109 meq/L (ref 96–112)
Creatinine, Ser: 1.2 mg/dL (ref 0.4–1.5)
Hgb A1c MFr Bld: 5.8 % (ref 4.6–6.5)
Magnesium: 1.9 mg/dL (ref 1.5–2.5)
Microalb Creat Ratio: 20.4 mg/g (ref 0.0–30.0)
Microalb, Ur: 5.9 mg/dL — ABNORMAL HIGH (ref 0.0–1.9)
Potassium: 4.5 meq/L (ref 3.5–5.1)
TSH: 1.94 microintl units/mL (ref 0.35–5.50)

## 2010-05-30 NOTE — Assessment & Plan Note (Signed)
Summary: bp elevated/kdc   Vital Signs:  Patient profile:   77 year old male Height:      66 inches Weight:      177 pounds Pulse rate:   72 / minute BP sitting:   142 / 70  Vitals Entered By: [name] (May 23, 2009 10:28 AM) CC: elevated tp Comments Patient states that BP is elevated in am (200's/70's) BP @ home over the last few days has been 175/55, 190/72, 174/70, 159/83, 188/80 [name]  May 23, 2009 10:30 AM    History of Present Illness: here with two of his daughters His BP he has noted to be elevated in the morning for the last few days----> 200's/70's  BP subsequently checked  @ home by the patient's daughter--->  175/55, 190/72, 174/70, 159/83, 188/80  slightly lightheaded some mornings, mostly when he stands up from bed  Current Medications (verified): 1)  Rapaflo 8 Mg Caps (Silodosin) .... Once Daily 2)  Glucophage 500 Mg  Tabs (Metformin Hcl) .... 2 By Mouth in Two Times A Day (For Diabetes) 3)  Simvastatin 80 Mg  Tabs (Simvastatin) .Marland Kitchen.. 1 By Mouth Once Daily For High Cholesterol 4)  Losartan Potassium 50 Mg Tabs (Losartan Potassium) .Marland Kitchen.. 1 A Day 5)  Hydrochlorothiazide 25 Mg Tabs (Hydrochlorothiazide) .... Take 1/2 Tablet Daily 6)  Zoloft 100 Mg Tabs (Sertraline Hcl) .Marland Kitchen.. 1 By Mouth Once Daily 7)  Alprazolam 0.5 Mg Tbdp (Alprazolam) .... 1/2 - 1 By Mouth Every 6 Hours   As Needed Anxiety 8)  Vicodin 5-500 Mg Tabs (Hydrocodone-Acetaminophen) .Marland Kitchen.. 1 By Mouth Every 6 Hours As Needed For Pain 9)  Aspirin 81 Mg Tbec (Aspirin) .Marland Kitchen.. 1  A Day 10)  Vitamin B 11)  Diabetic Tennis Shoes .... Dx 250.00 12)  4 Prong, Adjustable Cane .... Dx 715.90  Allergies (verified): No Known Drug Allergies  Past History:  Past Medical History: Reviewed history from 05/19/2009 and no changes required. Diabetes mellitus, type II Hyperlipidemia Hypertension Dementia Colon cancer, s/p resection 12-06 Prostate cancer, hx of EGD 11-06 (-) h/o Urolithiasis  h/o  uretheral stricture, cystoscopy 2005  (Dr Lorretta Harp) Anxiety FTT admited 08-2008 Osteoarthritis  Past Surgical History: Reviewed history from 06/22/2008 and no changes required. R hemicolectomy 12-06 h/o TURP  h/o XRT (ERT) for prostate ca   Social History: Reviewed history from 11/30/2008 and no changes required. lost wife 9-09 Alcohol use-no 3 daughters and 1 son (1 daughter lives in [place]) lives w/ daughter Thelma Barge who has schizophrenia since age 63 not driving at present ADL semi independent   Review of Systems       denies speech difficulties, mental status changes or motor deficits no chest pain no nosebleeds not taking any over-the-counter Motrin or Advil type of medicines no increase in sodium intake lately  Physical Exam  General:  alert, well-developed, and well-nourished.   Lungs:  normal respiratory effort, no intercostal retractions, no accessory muscle use, and normal breath sounds.   Heart:  normal rate, regular rhythm, and no murmur.   Abdomen:  soft, non-tender, and no masses.  no bruit Neurologic:  at baseline Psych:  at baseline   Impression & Recommendations:  Problem # 1:  HYPERTENSION (ICD-401.9) elevated blood pressure for the last few days he has a arm and a wrist cuff advised to use a arm cuff , see instructions   if the blood pressure continued to be elevated in the mornings, will adjust his meds (? PM losartan dose) His updated  medication list for this problem includes:    Losartan Potassium 50 Mg Tabs (Losartan potassium) .Marland Kitchen... 1 a day    Hydrochlorothiazide 25 Mg Tabs (Hydrochlorothiazide) .Marland Kitchen... Take 1/2 tablet daily  Complete Medication List: 1)  Rapaflo 8 Mg Caps (Silodosin) .... Once daily 2)  Glucophage 500 Mg Tabs (Metformin hcl) .... 2 by mouth in two times a day (for diabetes) 3)  Simvastatin 80 Mg Tabs (Simvastatin) .Marland Kitchen.. 1 by mouth once daily for high cholesterol 4)  Losartan Potassium 50 Mg Tabs (Losartan potassium) .Marland Kitchen.. 1 a  day 5)  Hydrochlorothiazide 25 Mg Tabs (Hydrochlorothiazide) .... Take 1/2 tablet daily 6)  Zoloft 100 Mg Tabs (Sertraline hcl) .Marland Kitchen.. 1 by mouth once daily 7)  Alprazolam 0.5 Mg Tbdp (Alprazolam) .... 1/2 - 1 by mouth every 6 hours   as needed anxiety 8)  Vicodin 5-500 Mg Tabs (Hydrocodone-acetaminophen) .Marland Kitchen.. 1 by mouth every 6 hours as needed for pain 9)  Aspirin 81 Mg Tbec (Aspirin) .Marland Kitchen.. 1  a day 10)  Vitamin B  11)  Diabetic Tennis Shoes  .... Dx 250.00 12)  4 Prong, Adjustable Cane  .... Dx 715.90  Patient Instructions: 1)  check your BP in the arm twice a day, call with  readings in one week 2)  call me if the readings are more than 140/85  3)  Limit your salt intake

## 2010-05-30 NOTE — Progress Notes (Signed)
Summary: FYI RE;  LOSARTAN change  Phone Note Refill Request Message from:  Pharmacy on [facility] [address] Fax #: [phone]  Refills Requested: Medication #1:  LOSARTAN POTASSIUM 50 MG TABS 1 by mouth two times a day   Dosage confirmed as above?Dosage Confirmed   Supply Requested: 1 month   Last Refilled: 07/05/2009 Not covered at this dose. Can we change to 100mg  1 tab qdaily  Initial call taken by: Harold Barban,  July 06, 2009 8:22 AM  Follow-up for Phone Call        Spoke with pharmacist, insurance will only pay for 30 tab in a 90 day period,   -- Dr Drue Novel,  rx changed  to Losartan 100mg  1 by mouth once daily  Follow-up by: Kandice Hams,  July 06, 2009 9:56 AM  Additional Follow-up for Phone Call Additional follow up Details #1::        agree Additional Follow-up by: [facility] E. Paz MD,  July 06, 2009 11:33 AM    New/Updated Medications: LOSARTAN POTASSIUM 100 MG TABS (LOSARTAN POTASSIUM) 1 by mouth once daily Prescriptions: LOSARTAN POTASSIUM 100 MG TABS (LOSARTAN POTASSIUM) 1 by mouth once daily  #30 x 1   Entered by:   Kandice Hams   Authorized by:   Nolon Rod. Paz MD   Signed by:   Kandice Hams on 07/06/2009   Method used:   Reprint   RxID:   [id number]

## 2010-05-30 NOTE — Letter (Signed)
Summary: CMN for Diabetes Supplies/[facility]  CMN for Diabetes Supplies/[facility]   Imported By: Lanelle Bal 06/02/2009 12:37:28  _____________________________________________________________________  External Attachment:    Type:   Image     Comment:   External Document

## 2010-05-30 NOTE — Progress Notes (Signed)
Summary: elevated bp  Phone Note Call from Patient Call back at Home Phone [phone]   Caller: daughter Steward Drone Details for Reason: ELEVATED BP Summary of Call: Hctz was increased to 25mg  on 1/27.  Daughter is still concerned about bp.  Patient does NOT feel bad.   BP readings: 150-200/56-80  Initial call taken by: [name],  June 02, 2009 10:24 AM  Follow-up for Phone Call        increase losartan to two times a day watch for low BPs, call if < 110/50 call w/ readings in 10 days  Follow-up by: [facility] E. Paz MD,  June 02, 2009 1:19 PM  Additional Follow-up for Phone Call Additional follow up Details #1::        discussed with daughter Additional Follow-up by: [name],  June 02, 2009 4:11 PM    New/Updated Medications: LOSARTAN POTASSIUM 50 MG TABS (LOSARTAN POTASSIUM) 1 by mouth two times a day

## 2010-05-30 NOTE — Letter (Signed)
Summary: CMN for Diabetic Shoes/[facility]  CMN for Diabetic Shoes/[facility]   Imported By: Lanelle Bal 06/08/2009 12:50:19  _____________________________________________________________________  External Attachment:    Type:   Image     Comment:   External Document

## 2010-05-30 NOTE — Progress Notes (Signed)
Summary: refill vicodin  Phone Note Refill Request Call back at Home Phone [phone] Message from:  Patient  Refills Requested: Medication #1:  VICODIN 5-500 MG TABS 1 by mouth every 6 hours as needed for pain last refill #60 x 0 on 05/19/09. last ov 05/23/09. pt has cpx 09/07/09 .Kandice Hams  August 18, 2009 9:23 AM    Follow-up for Phone Call        ok for #60, no refills. Follow-up by: Neena Rhymes MD,  August 18, 2009 9:43 AM    Prescriptions: VICODIN 5-500 MG TABS (HYDROCODONE-ACETAMINOPHEN) 1 by mouth every 6 hours as needed for pain  #60 x 0   Entered by:   Kandice Hams   Authorized by:   Neena Rhymes MD   Signed by:   Kandice Hams on 08/18/2009   Method used:   Printed then faxed to ...       [facility] [address] [phone]* (retail)       [address]       [place], [place]  [address]       Ph: [phone]       Fax: [phone]   RxID:   [id number]

## 2010-05-30 NOTE — Miscellaneous (Signed)
Summary: neg DM eye exam   Clinical Lists Changes  Observations: Added new observation of DMEYEEXAMNXT: 06/2010 (06/24/2009 10:55) Added new observation of DMEYEEXMRES: normal (06/24/2009 10:55) Added new observation of EYE EXAM BY: Ernesto Rutherford, MD (06/24/2009 10:55) Added new observation of DIAB EYE EX: normal (06/24/2009 10:55)       Diabetes Management Exam:    Eye Exam:       Eye Exam done elsewhere          Date: 06/24/2009          Results: normal          Done by: Ernesto Rutherford, MD

## 2010-05-30 NOTE — Progress Notes (Signed)
Summary: xanax  Phone Note Refill Request Call back at Home Phone [phone] Message from:  Daughter - Steward Drone on August 04, 2009 9:13 AM  Refills Requested: Medication #1:  ALPRAZOLAM 0.5 MG TBDP 1/2 - 1 by mouth every 6 hours   as needed anxiety   Notes: pt was rx'd #30 with 3 refills on 06/16/09 Spoke with daughter -- pt has been having more anxiety lately so he has been taking 1 by mouth two times a day.  I called pharmacy -- pt had #30 filled on 06/16/09 & 07/09/09.  There is one refill left on file.  Ok'd refill.  Will notify Dr. Drue Novel that patient has increased the amount of xanax he has been using.  Patient has ov scheduled in 2 weeks.   Method Requested: [facility]  - [address] Next Appointment Scheduled: 08/18/09 Initial call taken by: [name],  August 04, 2009 9:21 AM  Follow-up for Phone Call        noted Linton E. Yui Mulvaney MD  August 04, 2009 9:51 AM

## 2010-05-30 NOTE — Progress Notes (Signed)
Summary: bp readings  Phone Note Call from Patient Call back at Home Phone [phone]   Caller: Steward Drone - daughter Summary of Call: BP READINGS: 160/72, 142/60, 143/55, 144/59, 166/61, 161/69 on losartan 50 two times a day, hctz 25 qd [name]  June 09, 2009 11:33 AM   Follow-up for Phone Call        no change , OV 4 weeks from today (has appointment april , needs to be seen sooner) call if BPs > 160 Follow-up by: Nolon Rod. Jenaya Saar MD,  June 09, 2009 6:34 PM  Additional Follow-up for Phone Call Additional follow up Details #1::        discussed with pt husband Additional Follow-up by: [name],  June 10, 2009 11:26 AM

## 2010-05-30 NOTE — Assessment & Plan Note (Signed)
Summary: 3 mth fu/ns/kdc   Vital Signs:  Patient profile:   77 year old male Height:      66 inches Weight:      180.2 pounds Pulse rate:   88 / minute BP sitting:   130 / 70  Vitals Entered By: Dena Billet CC: rov Comments pt. complains of runny nose and thick eye drainage pt. wants to discuss vicodin.   History of Present Illness: here w/  daughetr  OA --vicodin does help well w/ pain , no excesive drowsiness   Allergies: No Known Drug Allergies  Past History:  Past Medical History: Diabetes mellitus, type II Hyperlipidemia Hypertension Dementia Colon cancer, s/p resection 12-06 Prostate cancer, hx of EGD 11-06 (-) h/o Urolithiasis  h/o uretheral stricture, cystoscopy 2005  (Dr Lorretta Harp) Anxiety FTT admited 08-2008 Osteoarthritis  Past Surgical History: Reviewed history from 06/22/2008 and no changes required. R hemicolectomy 12-06 h/o TURP  h/o XRT (ERT) for prostate ca   Review of Systems        Diabetes-- ambulatory CBGs "good" , rarely > 160 will start a exercise program , needs a Rx for tennis shoes and a cane  Hypertension-- ambulatory BPs usually ok Dementia-- per daughter stable    Physical Exam  General:  alert and well-developed.   Lungs:  normal respiratory effort, no intercostal retractions, no accessory muscle use, and normal breath sounds.   Heart:  normal rate, regular rhythm, and no murmur.   Psych:  normally interactive, good eye contact, not anxious appearing, and not depressed appearing.     Impression & Recommendations:  Problem # 1:  OSTEOARTHRITIS (ICD-715.90) I am refilling her Vicodin today, he is tolerating it well and helps  a lot w/  knee pain His updated medication list for this problem includes:    Vicodin 5-500 Mg Tabs (Hydrocodone-acetaminophen) .Marland Kitchen... 1 by mouth every 6 hours as needed for pain    Aspirin 81 Mg Tbec (Aspirin) .Marland Kitchen... 1  a day  Problem # 2:  HYPERTENSION (ICD-401.9) at goal  His updated  medication list for this problem includes:    Losartan Potassium 50 Mg Tabs (Losartan potassium) .Marland Kitchen... 1 a day    Hydrochlorothiazide 25 Mg Tabs (Hydrochlorothiazide) .Marland Kitchen... Take 1/2 tablet daily  BP today: 130/70 Prior BP: 140/64 (04/08/2009)  Labs Reviewed: K+: 3.9 (02/16/2009) Creat: : 0.8 (02/16/2009)   Chol: 132 (06/30/2008)   HDL: 51.8 (06/30/2008)   LDL: 70 (06/30/2008)   TG: 53 (06/30/2008)  Problem # 3:  DEMENTIA (ICD-294.8) seems stable  Problem # 4:  DIABETES MELLITUS, TYPE II (ICD-250.00) will start to exercise more, see review of systems labs  His updated medication list for this problem includes:    Glucophage 500 Mg Tabs (Metformin hcl) .Marland Kitchen... 2 by mouth in two times a day (for diabetes)    Losartan Potassium 50 Mg Tabs (Losartan potassium) .Marland Kitchen... 1 a day    Aspirin 81 Mg Tbec (Aspirin) .Marland Kitchen... 1  a day  Orders: Venipuncture (16[address]) TLB-A1C / Hgb A1C (Glycohemoglobin) (83036-A1C)  Labs Reviewed: Creat: 0.8 (02/16/2009)    Reviewed HgBA1c results: 5.8 (01/31/2009)  6.1 (06/30/2008)  Complete Medication List: 1)  Rapaflo 8 Mg Caps (Silodosin) .... Once daily 2)  Glucophage 500 Mg Tabs (Metformin hcl) .... 2 by mouth in two times a day (for diabetes) 3)  Simvastatin 80 Mg Tabs (Simvastatin) .Marland Kitchen.. 1 by mouth once daily for high cholesterol 4)  Losartan Potassium 50 Mg Tabs (Losartan potassium) .Marland Kitchen.. 1 a day 5)  Hydrochlorothiazide 25 Mg Tabs (Hydrochlorothiazide) .... Take 1/2 tablet daily 6)  Zoloft 100 Mg Tabs (Sertraline hcl) .Marland Kitchen.. 1 by mouth once daily 7)  Alprazolam 0.5 Mg Tbdp (Alprazolam) .... 1/2 - 1 by mouth every 6 hours   as needed anxiety 8)  Vicodin 5-500 Mg Tabs (Hydrocodone-acetaminophen) .Marland Kitchen.. 1 by mouth every 6 hours as needed for pain 9)  Aspirin 81 Mg Tbec (Aspirin) .Marland Kitchen.. 1  a day 10)  Vitamin B  11)  Diabetic Tennis Shoes  .... Dx 250.00 12)  4 Prong, Adjustable Cane  .... Dx 715.90  Other Orders: TLB-ALT (SGPT) (84460-ALT) TLB-AST (SGOT)  (84450-SGOT)  Patient Instructions: 1)  Please schedule a follow-up appointment in 3 months (fasting, yearly check up) Prescriptions: VICODIN 5-500 MG TABS (HYDROCODONE-ACETAMINOPHEN) 1 by mouth every 6 hours as needed for pain  #60 x 0   Entered and Authorized by:   Nolon Rod. Shahidah Nesbitt MD   Signed by:   Nolon Rod. Jayleon Mcfarlane MD on 05/19/2009   Method used:   Print then Give to Patient   RxID:   [id number] 4 PRONG, ADJUSTABLE CANE dx 715.90  #1 x 0   Entered by:   [name]   Authorized by:   Nolon Rod. Paelyn Smick MD   Signed by:   [name] on 05/19/2009   Method used:   Print then Give to Patient   RxID:   [id number] DIABETIC TENNIS SHOES dx 250.00  #1 x 0   Entered by:   [name]   Authorized by:   Nolon Rod. Jiles Goya MD   Signed by:   [name] on 05/19/2009   Method used:   Print then Give to Patient   RxID:   [id number]

## 2010-05-30 NOTE — Letter (Signed)
Summary: CMN for Diabetes Supplies/[facility]  CMN for Diabetes Supplies/[facility]   Imported By: Lanelle Bal 07/29/2009 09:36:41  _____________________________________________________________________  External Attachment:    Type:   Image     Comment:   External Document

## 2010-05-30 NOTE — Letter (Signed)
Summary: [facility]  [facility]   Imported By: Lanelle Bal 07/04/2009 09:37:44  _____________________________________________________________________  External Attachment:    Type:   Image     Comment:   External Document

## 2010-05-30 NOTE — Progress Notes (Signed)
Summary: alprazolam resent  Phone Note Refill Request Message from:  Fax from Pharmacy on October 21, 2009 4:16 PM  Refills Requested: Medication #1:  ALPRAZOLAM 0.5 MG TBDP 1/2 - 1 by mouth every 6 hours   as needed anxiety Initial call taken by: Doristine Devoid,  October 21, 2009 4:16 PM  Follow-up for Phone Call        prescription sent to wrong pharmcy ..........Marland KitchenDoristine Devoid  October 21, 2009 4:16 PM     Prescriptions: ALPRAZOLAM 0.5 MG TBDP (ALPRAZOLAM) 1/2 - 1 by mouth every 6 hours   as needed anxiety  #30 x 3   Entered by:   Doristine Devoid   Authorized by:   Nolon Rod. Paz MD   Signed by:   Doristine Devoid on 10/21/2009   Method used:   Printed then faxed to ...       [facility] [address] [phone]* (retail)       [address]       [place], [place]  [address]       Ph: [phone]       Fax: [phone]   RxID:   [id number]

## 2010-05-30 NOTE — Progress Notes (Signed)
Summary: Refill--Vicodin  Phone Note Refill Request Message from:  Fax from Pharmacy on November 22, 2009 10:54 AM  Refills Requested: Medication #1:  VICODIN 5-500 MG TABS 1 by mouth every 6 hours as needed for pain [facility] [place] - fax [phone] Jani Files [phone]  Initial call taken by: Okey Regal [place],  November 22, 2009 10:55 AM  Follow-up for Phone Call        Please advise. Toni Hoffmeister CMA  November 22, 2009 4:55 PM   Additional Follow-up for Phone Call Additional follow up Details #1::        #60, no refills Additional Follow-up by: Neena Rhymes MD,  November 23, 2009 8:02 AM    Additional Follow-up for Phone Call Additional follow up Details #2::    Called to [facility], NOT [facility]. Jarquez Mestre CMA  November 23, 2009 3:44 PM   Prescriptions: VICODIN 5-500 MG TABS (HYDROCODONE-ACETAMINOPHEN) 1 by mouth every 6 hours as needed for pain  #60 x 0   Entered by:   Lucious Groves CMA   Authorized by:   Neena Rhymes MD   Signed by:   Lucious Groves CMA on 11/23/2009   Method used:   Telephoned to ...       [facility]  [address] [id number] (retail)       [address]       [place], [place]  [address]       Ph: [phone]       Fax: [phone]   RxID:   [id number]

## 2010-05-30 NOTE — Progress Notes (Signed)
Summary: elevated bp  Phone Note Call from Patient Call back at [facility] Phone [phone] Call back at [phone]   Details for Reason: DAUGHTER (Russell [name]) LEFT MESSAGE ON VM:  BP is still elevated, now what? [name]  May 26, 2009 1:26 PM  Summary of Call: Left message on machine for daughter to return call with bp readings [name]  May 26, 2009 1:30 PM   Follow-up for Phone Call        Russell [name] HUSBAND IS CALLING BACK WITH BP READING--186/69 KGM-01,027/25 PUL-35,189/76 PUL-66,170/53 PUL-43,177/66 PUL-61,180/ THEY ARE CONCERN ABOUT IT BEING HIGH AND LOW AND HOW HE WAKES UP BEING DIZZY. [phone] Follow-up by: Russell [name],  May 26, 2009 2:07 PM  Additional Follow-up for Phone Call Additional follow up Details #1::        increase  HCTZ to 1 full tab in AM take losartan in the afternoon (so he spread his BP meds) call w/ BP readings in 1 week Russell E. Paz MD  May 26, 2009 5:14 PM     Additional Follow-up for Phone Call Additional follow up Details #2::    Spoke with Russell [name] - advised of change in meds.  Russell [name] states that she does not know if her father is on HCTZ or Losartan.  Her medication list that she has is from 8/10.  Warren Lacy to go to her father's house & look @ all his bottles & call me with meds that he is currently taking [name]  May 27, 2009 11:09 AM   Spoke with Russell [name] - Patient does have hctz & losartan @ his house.  Reviewed all meds & instructions with daughter [name]  May 27, 2009 1:00 PM   New/Updated Medications: LOSARTAN POTASSIUM 50 MG TABS (LOSARTAN POTASSIUM) 1 a day (in the afternoon) HYDROCHLOROTHIAZIDE 25 MG TABS (HYDROCHLOROTHIAZIDE) 1 by mouth qam

## 2010-05-30 NOTE — Letter (Signed)
Summary: Call a Nurse  Call a Nurse   Imported By: Lanelle Bal 05/31/2009 09:14:31  _____________________________________________________________________  External Attachment:    Type:   Image     Comment:   External Document

## 2010-05-30 NOTE — Assessment & Plan Note (Signed)
Summary: yearly---/FASTING/NS/KDC//rsh from bmp//lch   Vital Signs:  Patient profile:   77 year old male Height:      66 inches Weight:      174 pounds BMI:     28.19 Pulse rate:   68 / minute BP sitting:   100 / 60  Vitals Entered By: [name] (Sep 07, 2009 9:38 AM) CC: yearly, fasting Is Patient Diabetic? Yes Comments FBS this am [address]  Sep 07, 2009 9:44 AM    History of Present Illness: yearly check up, chart reviewed  here w/ daughter   Diabetes-- ambulatory CBGs in the 90s, occasionally 11 (w/ essentially no symptoms)   Hypertension-- ambulatory BPs go up and down, higher in AM (167/67 yesterday), no episodes of fatigue   Dementia-- patient's daughter reports his memory is good   Anxiety-- no major problems w/ anxiety, depressive mood sometimes     Allergies: No Known Drug Allergies  Past History:  Past Medical History: Reviewed history from 05/19/2009 and no changes required. Diabetes mellitus, type II Hyperlipidemia Hypertension Dementia Colon cancer, s/p resection 12-06 Prostate cancer, hx of EGD 11-06 (-) h/o Urolithiasis  h/o uretheral stricture, cystoscopy 2005  (Dr Lorretta Harp) Anxiety FTT admited 08-2008 Osteoarthritis  Past Surgical History: Reviewed history from 06/22/2008 and no changes required. R hemicolectomy 12-06 h/o TURP  h/o XRT (ERT) for prostate ca   Family History: Reviewed history from 05/13/2007 and no changes required. n.  c.   Social History: lost wife 9-09 tobacco-- quit a while back Alcohol use-no 3 daughters and 1 son (1 daughter lives in [place]) lives w/ daughter Thelma Barge who has schizophrenia since age 4 not driving at present ADL ok  does minimal  cooking   Review of Systems CV:  Denies chest pain or discomfort, palpitations, and swelling of feet. Resp:  Denies cough and shortness of breath. GI:  Denies bloody stools, diarrhea, nausea, and vomiting. GU:  Denies hematuria, urinary frequency, and  urinary hesitancy.  Physical Exam  General:  alert and well-developed.   Lungs:  normal respiratory effort, no intercostal retractions, no accessory muscle use, and decreased  breath sounds.   Heart:  bradycardic, no murmur Abdomen:  soft, non-tender, and no distention.   Pulses:  normal pedal pulses bilaterally  Extremities:  no edema, good capillary refill distally in the lower extremities Psych:  Oriented X3, not anxious appearing, and not depressed appearing.    Diabetes Management Exam:    Foot Exam (with socks and/or shoes not present):       Sensory-Pinprick/Light touch:          Left medial foot (L-4): normal          Left dorsal foot (L-5): normal          Left lateral foot (S-1): normal          Right medial foot (L-4): normal          Right dorsal foot (L-5): normal          Right lateral foot (S-1): normal       Sensory-Monofilament:          Left foot: normal          Right foot: normal       Inspection:          Left foot: normal          Right foot: normal       Nails:  Left foot: too long          Right foot: too long   Impression & Recommendations:  Problem # 1:  HYPERTENSION (ICD-401.9) BP is slightly low today, he is asymptomatic, ambulatory BPS occ elevated Plan: No change His updated medication list for this problem includes:    Losartan Potassium 100 Mg Tabs (Losartan potassium) .Marland Kitchen... 1 by mouth once daily    Hydrochlorothiazide 25 Mg Tabs (Hydrochlorothiazide) .Marland Kitchen... 1 by mouth qam    BP today: 100/60 Prior BP: 142/70 (05/23/2009)  Labs Reviewed: K+: 3.9 (02/16/2009) Creat: : 0.8 (02/16/2009)   Chol: 132 (06/30/2008)   HDL: 51.8 (06/30/2008)   LDL: 70 (06/30/2008)   TG: 53 (06/30/2008)  Orders: TLB-BMP (Basic Metabolic Panel-BMET) (80048-METABOL) TLB-CBC Platelet - w/Differential (85025-CBCD)  Problem # 2:  HEALTH SCREENING (ICD-V70.0) pneumonia shot 05-2007 Td  1-09    Problem # 3:  DEPRESSION (ICD-311)  depressive moods  sometimes  according to the daughter, patient seems to be doing very well today.  No change His updated medication list for this problem includes:    Zoloft 100 Mg Tabs (Sertraline hcl) .Marland Kitchen... 1 by mouth once daily    Alprazolam 0.5 Mg Tbdp (Alprazolam) .Marland Kitchen... 1/2 - 1 by mouth every 6 hours   as needed anxiety  Problem # 4:  DEMENTIA (ICD-294.8) doing really well  Problem # 5:  ADENOCARCINOMA, PROSTATE (ICD-185) saw urology 03/2009, they found the patient to be doing well, the bladder to check a PSA, results not available to me. Next follow-up with urology 12/11  Problem # 6:  COLON CANCER, HX OF (ICD-V10.05) follow-up by GI, last colonoscopy 02-2008, had two polyps next colonoscopy per GI  Problem # 7:  HYPERLIPIDEMIA (ICD-272.4)  labs His updated medication list for this problem includes:    Simvastatin 80 Mg Tabs (Simvastatin) .Marland Kitchen... 1 by mouth once daily for high cholesterol    Labs Reviewed: SGOT: 26 (05/19/2009)   SGPT: 15 (05/19/2009)   HDL:51.8 (06/30/2008), 30.9 (05/13/2007)  LDL:70 (06/30/2008), 43 (05/13/2007)  Chol:132 (06/30/2008), 101 (10/14/2007)  Trig:53 (06/30/2008), 63 (05/13/2007)  Orders: Venipuncture (40981) TLB-ALT (SGPT) (84460-ALT) TLB-AST (SGOT) (84450-SGOT) TLB-Lipid Panel (80061-LIPID)  Problem # 8:  DIABETES MELLITUS, TYPE II (ICD-250.00)  at some point she was diagnosed with retinopathy, the family reports that he saw the eye doctor 2/11 and was told it was okay His updated medication list for this problem includes:    Glucophage 500 Mg Tabs (Metformin hcl) .Marland Kitchen... 2 by mouth in two times a day (for diabetes)    Losartan Potassium 100 Mg Tabs (Losartan potassium) .Marland Kitchen... 1 by mouth once daily    Aspirin 81 Mg Tbec (Aspirin) .Marland Kitchen... 1  a day    Labs Reviewed: Creat: 0.8 (02/16/2009)     Last Eye Exam: normal (06/24/2009) Reviewed HgBA1c results: 5.7 (05/19/2009)  5.8 (01/31/2009)  Orders: TLB-A1C / Hgb A1C (Glycohemoglobin)  (83036-A1C) TLB-Microalbumin/Creat Ratio, Urine (82043-MALB)  Complete Medication List: 1)  Rapaflo 8 Mg Caps (Silodosin) .... Once daily 2)  Glucophage 500 Mg Tabs (Metformin hcl) .... 2 by mouth in two times a day (for diabetes) 3)  Simvastatin 80 Mg Tabs (Simvastatin) .Marland Kitchen.. 1 by mouth once daily for high cholesterol 4)  Losartan Potassium 100 Mg Tabs (Losartan potassium) .Marland Kitchen.. 1 by mouth once daily 5)  Hydrochlorothiazide 25 Mg Tabs (Hydrochlorothiazide) .Marland Kitchen.. 1 by mouth qam 6)  Zoloft 100 Mg Tabs (Sertraline hcl) .Marland Kitchen.. 1 by mouth once daily 7)  Alprazolam 0.5 Mg Tbdp (Alprazolam) .... 1/2 -  1 by mouth every 6 hours   as needed anxiety 8)  Vicodin 5-500 Mg Tabs (Hydrocodone-acetaminophen) .Marland Kitchen.. 1 by mouth every 6 hours as needed for pain 9)  Aspirin 81 Mg Tbec (Aspirin) .Marland Kitchen.. 1  a day 10)  Vitamin B  11)  Diabetic Tennis Shoes  .... Dx 250.00 12)  4 Prong, Adjustable Cane  .... Dx 715.90  Patient Instructions: 1)  Please schedule a follow-up appointment in 4 to 5  months 2)  check your blood pressure 3 or 4 times a week, please call me if it is consistently more than 140/85 or lower than  105/60

## 2010-05-30 NOTE — Progress Notes (Signed)
  Phone Note Refill Request Call back at Home Phone [phone] Message from:  Patient Russell [name]   Refills Requested: Medication #1:  LOSARTAN POTASSIUM 50 MG TABS 1 by mouth two times a day  Medication #2:  HYDROCHLOROTHIAZIDE 25 MG TABS 1 by mouth qam send to [facility] faxed pt daughter informed  Initial call taken by: Kandice Hams,  July 05, 2009 3:57 PM Caller: Daughter Renato Shin Reason for Call: Refill Medication    Prescriptions: HYDROCHLOROTHIAZIDE 25 MG TABS (HYDROCHLOROTHIAZIDE) 1 by mouth qam  #30 x 1   Entered by:   Kandice Hams   Authorized by:   Nolon Rod. Paz MD   Signed by:   Kandice Hams on 07/05/2009   Method used:   Faxed to ...       [facility] [address] [phone]* (retail)       [address]       [place], [place]  [address]       Ph: [phone]       Fax: [phone]   RxID:   [id number] LOSARTAN POTASSIUM 50 MG TABS (LOSARTAN POTASSIUM) 1 by mouth two times a day  #60 x 1   Entered by:   Kandice Hams   Authorized by:   Nolon Rod. Paz MD   Signed by:   Kandice Hams on 07/05/2009   Method used:   Faxed to ...       [facility] [address] [phone]* (retail)       [address]       [place], [place]  [address]       Ph: [phone]       Fax: [phone]   RxID:   [id number]

## 2010-05-30 NOTE — Letter (Signed)
Summary: Primary Care Appointment Letter  [facility]  [address] [place], [place] [address]   Phone: [phone]  Fax: [phone]    07/28/2009 MRN: [id number]  Russell [name] [address] [place], [place]  [address]  Dear [name],   Your Primary Care Physician [place] E. Paz MD has indicated that:    _______it is time to schedule an appointment.    _______you missed your appointment on______ and need to call and          reschedule.    _______you need to have lab work done.    _______you need to schedule an appointment discuss lab or test results.    ____x___you need to call to reschedule your appointment that is                       scheduled on _april 21,2011 @ 8:40am________.     Please call our office as soon as possible. Our phone number is [phone]________. Please press option 1. Our office is open 8a-12noon and 1p-5p, Monday through Friday.     Thank you,     Primary Care Scheduler

## 2010-05-30 NOTE — Assessment & Plan Note (Signed)
Summary: RTO 4 MONTHS,CBS   Vital Signs:  Patient profile:   77 year old male Weight:      185 pounds Pulse rate:   67 / minute Pulse rhythm:   regular BP sitting:   128 / 82  (left arm) Cuff size:   regular  Vitals Entered By: Army Fossa CMA (January 17, 2010 9:29 AM) CC: 4 month f/u Comments flu shot pharm- [facility] [address] refill on vicodin   History of Present Illness: routine office visit, here w/daughter   Diabetes -- decreased meds base on last labs, good medication compliance , ambulatory CBGs in the 130s, occasionally in the 90s   Hyperlipidemia-- base on last FLP we decreased simva dose, good medication compliance   Hypertension-- ambulatory BPs wnl x months         Preventive Screening-Counseling & Management  Caffeine-Diet-Exercise     Does Patient Exercise: yes     Times/week: 7  Current Medications (verified): 1)  Rapaflo 8 Mg Caps (Silodosin) .... Once Daily 2)  Glucophage 500 Mg  Tabs (Metformin Hcl) .Marland Kitchen.. 1 By Mouth in Two Times A Day (For Diabetes) 3)  Simvastatin 80 Mg  Tabs (Simvastatin) .... 1/2 By Mouth Once Daily For High Cholesterol 4)  Losartan Potassium 100 Mg Tabs (Losartan Potassium) .Marland Kitchen.. 1 By Mouth Once Daily 5)  Hydrochlorothiazide 25 Mg Tabs (Hydrochlorothiazide) .Marland Kitchen.. 1 By Mouth Qam 6)  Zoloft 100 Mg Tabs (Sertraline Hcl) .Marland Kitchen.. 1 By Mouth Once Daily 7)  Alprazolam 0.5 Mg Tbdp (Alprazolam) .... 1/2 - 1 By Mouth Every 6 Hours   As Needed Anxiety 8)  Vicodin 5-500 Mg Tabs (Hydrocodone-Acetaminophen) .Marland Kitchen.. 1 By Mouth Every 6 Hours As Needed For Pain 9)  Aspirin 81 Mg Tbec (Aspirin) .Marland Kitchen.. 1  A Day 10)  Vitamin B 11)  Diabetic Tennis Shoes .... Dx 250.00 12)  4 Prong, Adjustable Cane .... Dx 715.90  Allergies (verified): No Known Drug Allergies  Past History:  Past Medical History: Diabetes mellitus, type II Hyperlipidemia Hypertension Dementia Colon cancer, s/p resection 12-06 Prostate cancer, hx of EGD 11-06 (-) h/o  Urolithiasis  h/o uretheral stricture, cystoscopy 2005  (Dr Lorretta Harp) Anxiety FTT admited 08-2008 Osteoarthritis  Past Surgical History: Reviewed history from 06/22/2008 and no changes required. R hemicolectomy 12-06 h/o TURP  h/o XRT (ERT) for prostate ca   Social History: Does Patient Exercise:  yes  Review of Systems CV:  Denies chest pain or discomfort and palpitations. Resp:  Denies cough and shortness of breath. Psych:  mood ok, denies anxiety depression at present .  Physical Exam  General:  alert, well-developed, and well-nourished.   Lungs:  normal respiratory effort, no intercostal retractions, no accessory muscle use, and normal breath sounds.   Heart:  normal rate, regular rhythm, and no murmur.   Extremities:  no lower extremity edema Knee deformities consistent with DJD Psych:  Oriented X3, not anxious appearing, and not depressed appearing.     Impression & Recommendations:  Problem # 1:  HYPERTENSION (ICD-401.9) at goal last creatinine slightly higher than usual, recheck BMP  His updated medication list for this problem includes:    Losartan Potassium 100 Mg Tabs (Losartan potassium) .Marland Kitchen... 1 by mouth once daily    Hydrochlorothiazide 25 Mg Tabs (Hydrochlorothiazide) .Marland Kitchen... 1 by mouth qam  BP today: 128/82 Prior BP: 100/60 (09/07/2009)  Labs Reviewed: K+: 3.9 (09/07/2009) Creat: : 1.2 (09/07/2009)   Chol: 111 (09/07/2009)   HDL: 46.10 (09/07/2009)   LDL: 56 (09/07/2009)  TG: 45.0 (09/07/2009)  Orders: TLB-BMP (Basic Metabolic Panel-BMET) (80048-METABOL) Specimen Handling (16[address])  Problem # 2:  HEALTH SCREENING (ICD-V70.0) Td  1-09 pneumonia shot 05-2007 flu shot today Shingles immunization discussed  Problem # 3:  DEPRESSION (ICD-311) Symptoms well controlled His updated medication list for this problem includes:    Zoloft 100 Mg Tabs (Sertraline hcl) .Marland Kitchen... 1 by mouth once daily    Alprazolam 0.5 Mg Tbdp (Alprazolam) .Marland Kitchen... 1/2 - 1 by mouth  every 6 hours   as needed anxiety    Problem # 4:  HYPERLIPIDEMIA (ICD-272.4) good compliance with medication His updated medication list for this problem includes:    Simvastatin 80 Mg Tabs (Simvastatin) .Marland Kitchen... 1/2 by mouth once daily for high cholesterol  Labs Reviewed: SGOT: 26 (09/07/2009)   SGPT: 18 (09/07/2009)   HDL:46.10 (09/07/2009), 51.8 (06/30/2008)  LDL:56 (09/07/2009), 70 (06/30/2008)  Chol:111 (09/07/2009), 132 (06/30/2008)  Trig:45.0 (09/07/2009), 53 (06/30/2008)  Problem # 5:  DIABETES MELLITUS, TYPE II (ICD-250.00) we recently decreased dose of Glucophage after the A1c was 5.7. Labs His updated medication list for this problem includes:    Glucophage 500 Mg Tabs (Metformin hcl) .Marland Kitchen... 1 by mouth in two times a day (for diabetes)    Losartan Potassium 100 Mg Tabs (Losartan potassium) .Marland Kitchen... 1 by mouth once daily    Aspirin 81 Mg Tbec (Aspirin) .Marland Kitchen... 1  a day    Labs Reviewed: Creat: 1.2 (09/07/2009)     Last Eye Exam: normal (06/24/2009) Reviewed HgBA1c results: 5.7 (09/07/2009)  5.7 (05/19/2009)  Orders: Venipuncture (60454) TLB-A1C / Hgb A1C (Glycohemoglobin) (83036-A1C) Specimen Handling (09811)  Problem # 6:  OSTEOARTHRITIS (ICD-715.90) needs refill on Vicodin which he takes sporadically His updated medication list for this problem includes:    Vicodin 5-500 Mg Tabs (Hydrocodone-acetaminophen) .Marland Kitchen... 1 by mouth every 6 hours as needed for pain    Aspirin 81 Mg Tbec (Aspirin) .Marland Kitchen... 1  a day  Complete Medication List: 1)  Rapaflo 8 Mg Caps (Silodosin) .... Once daily 2)  Glucophage 500 Mg Tabs (Metformin hcl) .Marland Kitchen.. 1 by mouth in two times a day (for diabetes) 3)  Simvastatin 80 Mg Tabs (Simvastatin) .... 1/2 by mouth once daily for high cholesterol 4)  Losartan Potassium 100 Mg Tabs (Losartan potassium) .Marland Kitchen.. 1 by mouth once daily 5)  Hydrochlorothiazide 25 Mg Tabs (Hydrochlorothiazide) .Marland Kitchen.. 1 by mouth qam 6)  Zoloft 100 Mg Tabs (Sertraline hcl) .Marland Kitchen.. 1 by  mouth once daily 7)  Alprazolam 0.5 Mg Tbdp (Alprazolam) .... 1/2 - 1 by mouth every 6 hours   as needed anxiety 8)  Vicodin 5-500 Mg Tabs (Hydrocodone-acetaminophen) .Marland Kitchen.. 1 by mouth every 6 hours as needed for pain 9)  Aspirin 81 Mg Tbec (Aspirin) .Marland Kitchen.. 1  a day 10)  Vitamin B  11)  Diabetic Tennis Shoes  .... Dx 250.00 12)  4 Prong, Adjustable Cane  .... Dx 715.90  Other Orders: Flu Vaccine 72yrs + MEDICARE PATIENTS (B1478) Administration Flu vaccine - MCR (G9562)  Patient Instructions: 1)  Please schedule a follow-up appointment in 4 months .  Prescriptions: HYDROCHLOROTHIAZIDE 25 MG TABS (HYDROCHLOROTHIAZIDE) 1 by mouth qam  #30 x 1   Entered and Authorized by:   Elita Quick E. Paz MD   Signed by:   Nolon Rod. Paz MD on 01/17/2010   Method used:   Reprint   RxID:   [id number] LOSARTAN POTASSIUM 100 MG TABS (LOSARTAN POTASSIUM) 1 by mouth once daily  #30 Each x 2   Entered and  Authorized by:   Nolon Rod Paz MD   Signed by:   Nolon Rod. Paz MD on 01/17/2010   Method used:   Reprint   RxID:   [id number] VICODIN 5-500 MG TABS (HYDROCODONE-ACETAMINOPHEN) 1 by mouth every 6 hours as needed for pain  #60 x 0   Entered and Authorized by:   Nolon Rod. Paz MD   Signed by:   Nolon Rod. Paz MD on 01/17/2010   Method used:   Print then Give to Patient   RxID:   [id number] LOSARTAN POTASSIUM 100 MG TABS (LOSARTAN POTASSIUM) 1 by mouth once daily  #30 Each x 2   Entered by:   Army Fossa CMA   Authorized by:   Nolon Rod. Paz MD   Signed by:   Army Fossa CMA on 01/17/2010   Method used:   Electronically to        [facility] [phone]* (retail)       [address]       [place], [place]  [address]       Ph: [phone]       Fax: [phone]   RxID:   [id number]    Risk Factors:  Exercise:  yes    Times per week:  7 Flu Vaccine Consent Questions     Do you have a history of severe allergic reactions to this vaccine? no    Any prior history of allergic reactions to egg  and/or gelatin? no    Do you have a sensitivity to the preservative Thimersol? no    Do you have a past history of Guillan-Barre Syndrome? no    Do you currently have an acute febrile illness? no    Have you ever had a severe reaction to latex? no    Vaccine information given and explained to patient? yes    Are you currently pregnant? no    Lot Number:[id number]   Exp Date:10/28/2010   Site Given  Left Deltoid IMr week:  7    .lbmedflu

## 2010-06-01 ENCOUNTER — Telehealth: Payer: Self-pay | Admitting: Internal Medicine

## 2010-06-01 NOTE — Assessment & Plan Note (Signed)
Summary: 4 MONTH FOLLOWUP///SPH   Vital Signs:  Patient profile:   77 year old male Weight:      182.38 pounds Pulse rate:   80 / minute Pulse rhythm:   regular BP sitting:   136 / 86  (left arm) Cuff size:   regular  Vitals Entered By: Army Fossa CMA (May 17, 2010 9:04 AM) CC: follow up visit- fasting  Comments states he is taking Zoloft- has not been refilled since 2010. Was given samples at urologist- unsure of name [facility] [address]    History of Present Illness: follow up visit- fasting   depression --states he is taking Zoloft- has not been refilled since 2010. compliance?  saw urology for incontinence, was given samples at urologist- unsure of name     Diabetes-- ambulatory CBGs in AM  ~ 98 to 120     Current Medications (verified): 1)  Rapaflo 8 Mg Caps (Silodosin) .... Once Daily 2)  Glucophage 500 Mg  Tabs (Metformin Hcl) .Marland Kitchen.. 1 By Mouth in Two Times A Day (For Diabetes) 3)  Simvastatin 80 Mg  Tabs (Simvastatin) .... 1/2 By Mouth Once Daily For High Cholesterol 4)  Losartan Potassium 100 Mg Tabs (Losartan Potassium) .Marland Kitchen.. 1 By Mouth Once Daily 5)  Hydrochlorothiazide 25 Mg Tabs (Hydrochlorothiazide) .Marland Kitchen.. 1 By Mouth Qam 6)  Zoloft 100 Mg Tabs (Sertraline Hcl) .Marland Kitchen.. 1 By Mouth Once Daily 7)  Alprazolam 0.5 Mg Tbdp (Alprazolam) .... 1/2 - 1 By Mouth Every 6 Hours   As Needed Anxiety 8)  Vicodin 5-500 Mg Tabs (Hydrocodone-Acetaminophen) .Marland Kitchen.. 1 By Mouth Every 6 Hours As Needed For Pain 9)  Aspirin 81 Mg Tbec (Aspirin) .Marland Kitchen.. 1  A Day 10)  Vitamin B 11)  Diabetic Tennis Shoes .... Dx 250.00 12)  4 Prong, Adjustable Cane .... Dx 715.90  Allergies (verified): No Known Drug Allergies  Past History:  Past Medical History: Diabetes mellitus, type II Hyperlipidemia Hypertension Dementia anxiety-depression  Colon cancer, s/p resection 12-06 Prostate cancer, hx of EGD 11-06 (-) h/o Urolithiasis  h/o uretheral stricture, cystoscopy 2005  (Dr Lorretta Harp)  FTT  admited 08-2008 Osteoarthritis  Past Surgical History: Reviewed history from 06/22/2008 and no changes required. R hemicolectomy 12-06 h/o TURP  h/o XRT (ERT) for prostate ca   Social History: Reviewed history from 09/07/2009 and no changes required. lost wife 9-09 tobacco-- quit a while back Alcohol use-no 3 daughters and 1 son (1 daughter lives in [place]) lives w/ daughter Thelma Barge who has schizophrenia since age 38 not driving at present ADL ok  does minimal  cooking   Review of Systems CV:  Denies chest pain or discomfort and swelling of feet. Resp:  Denies cough and shortness of breath. Psych:  states his mood is okay, currently no problems with anxiety or depression.  Physical Exam  General:  alert, well-developed, and well-nourished.   Lungs:  normal respiratory effort, no intercostal retractions, no accessory muscle use, and normal breath sounds.   Heart:  normal rate, regular rhythm, and no murmur.   Extremities:  no lower extremity edema     Impression & Recommendations:  Problem # 1:  HYPERTENSION (ICD-401.9) no change His updated medication list for this problem includes:    Losartan Potassium 100 Mg Tabs (Losartan potassium) .Marland Kitchen... 1 by mouth once daily    Hydrochlorothiazide 25 Mg Tabs (Hydrochlorothiazide) .Marland Kitchen... 1 by mouth qam  BP today: 136/86 Prior BP: 128/82 (01/17/2010)  Labs Reviewed: K+: 4.0 (01/17/2010) Creat: : 1.3 (01/17/2010)  Chol: 111 (09/07/2009)   HDL: 46.10 (09/07/2009)   LDL: 56 (09/07/2009)   TG: 45.0 (09/07/2009)  Problem # 2:  URINARY INCONTINENCE (ICD-788.30) managed by urology, they started a trial with a new medicine. Name?  Problem # 3:  DIABETES MELLITUS, TYPE II (ICD-250.00)  due  for blood work His updated medication list for this problem includes:    Glucophage 500 Mg Tabs (Metformin hcl) .Marland Kitchen... 1 by mouth in two times a day (for diabetes)    Losartan Potassium 100 Mg Tabs (Losartan potassium) .Marland Kitchen... 1 by mouth once daily     Aspirin 81 Mg Tbec (Aspirin) .Marland Kitchen... 1  a day  Labs Reviewed: Creat: 1.3 (01/17/2010)     Last Eye Exam: normal (06/24/2009) Reviewed HgBA1c results: 6.2 (01/17/2010)  5.7 (09/07/2009)  Orders: Venipuncture (16109) TLB-ALT (SGPT) (84460-ALT) TLB-AST (SGOT) (84450-SGOT) TLB-A1C / Hgb A1C (Glycohemoglobin) (83036-A1C) Specimen Handling (60454)  Complete Medication List: 1)  Rapaflo 8 Mg Caps (Silodosin) .... Once daily 2)  Glucophage 500 Mg Tabs (Metformin hcl) .Marland Kitchen.. 1 by mouth in two times a day (for diabetes) 3)  Simvastatin 80 Mg Tabs (Simvastatin) .... 1/2 by mouth once daily for high cholesterol 4)  Losartan Potassium 100 Mg Tabs (Losartan potassium) .Marland Kitchen.. 1 by mouth once daily 5)  Hydrochlorothiazide 25 Mg Tabs (Hydrochlorothiazide) .Marland Kitchen.. 1 by mouth qam 6)  Zoloft 100 Mg Tabs (Sertraline hcl) .Marland Kitchen.. 1 by mouth once daily 7)  Alprazolam 0.5 Mg Tbdp (Alprazolam) .... 1/2 - 1 by mouth every 6 hours   as needed anxiety 8)  Vicodin 5-500 Mg Tabs (Hydrocodone-acetaminophen) .Marland Kitchen.. 1 by mouth every 6 hours as needed for pain 9)  Aspirin 81 Mg Tbec (Aspirin) .Marland Kitchen.. 1  a day 10)  Vitamin B  11)  Diabetic Tennis Shoes  .... Dx 250.00 12)  4 Prong, Adjustable Cane  .... Dx 715.90  Patient Instructions: 1)  Please schedule a follow-up appointment in 3 to 4 months .    Orders Added: 1)  Venipuncture [36415] 2)  TLB-ALT (SGPT) [84460-ALT] 3)  TLB-AST (SGOT) [84450-SGOT] 4)  TLB-A1C / Hgb A1C (Glycohemoglobin) [83036-A1C] 5)  Specimen Handling [99000] 6)  Est. Patient Level III [09811]

## 2010-06-01 NOTE — Letter (Signed)
Summary: [facility]  [facility]   Imported By: Lanelle Bal 04/27/2010 12:49:51  _____________________________________________________________________  External Attachment:    Type:   Image     Comment:   External Document

## 2010-06-07 NOTE — Progress Notes (Signed)
Summary: refill  Phone Note Refill Request Message from:  Fax from Pharmacy on June 01, 2010 2:29 PM  Refills Requested: Medication #1:  HYDROCHLOROTHIAZIDE 25 MG TABS 1 by mouth qam  Medication #2:  ALPRAZOLAM 0.5 MG TBDP 1/2 - 1 by mouth every 6 hours   as needed anxiety [facility] - fax [phone] - phone [phone]  Initial call taken by: Okey Regal Spring,  June 01, 2010 2:31 PM  Additional Follow-up for Phone Call Additional follow up Details #1::        alprazolam 30 and 6 RF  Additional Follow-up by: Sulayman Manning E. Kateline Kinkade MD,  June 02, 2010 10:22 AM    Prescriptions: ALPRAZOLAM 0.5 MG TBDP (ALPRAZOLAM) 1/2 - 1 by mouth every 6 hours   as needed anxiety  #30 x 6   Entered by:   Army Fossa CMA   Authorized by:   Nolon Rod. Alegandro Macnaughton MD   Signed by:   Army Fossa CMA on 06/02/2010   Method used:   Printed then faxed to ...       [facility] [address] [phone]* (retail)       [address]       [place], [place]  [address]       Ph: [phone]       Fax: [phone]   RxID:   [id number] HYDROCHLOROTHIAZIDE 25 MG TABS (HYDROCHLOROTHIAZIDE) 1 by mouth qam  #30 Each x 5   Entered by:   Army Fossa CMA   Authorized by:   Nolon Rod. Dalasia Predmore MD   Signed by:   Army Fossa CMA on 06/02/2010   Method used:   Electronically to        [facility] [phone]* (retail)       [address]       [place], [place]  [address]       Ph: [phone]       Fax: [phone]   RxID:   [id number]

## 2010-06-07 NOTE — Letter (Signed)
Summary: [facility]   Imported By: Lanelle Bal 05/26/2010 09:17:47  _____________________________________________________________________  External Attachment:    Type:   Image     Comment:   External Document

## 2010-06-14 ENCOUNTER — Encounter: Payer: Self-pay | Admitting: Internal Medicine

## 2010-07-06 NOTE — Letter (Signed)
Summary: [facility]  [facility]   Imported By: Maryln Gottron 06/22/2010 08:17:55  _____________________________________________________________________  External Attachment:    Type:   Image     Comment:   External Document

## 2010-07-07 ENCOUNTER — Telehealth: Payer: Self-pay | Admitting: Internal Medicine

## 2010-07-11 NOTE — Progress Notes (Signed)
Summary: Refill  Phone Note Refill Request Call back at [phone] Message from:  Patient Daughter on July 07, 2010 12:47 PM  Refills Requested: Medication #1:  ZOLOFT 100 MG TABS 1 by mouth once daily [facility] Patient has been having crying spells and has been off of his meds for at least 2 weeks. Please call Daughter, Renato Shin, to let her know when this is done.   Method Requested: Telephone to Pharmacy Next Appointment Scheduled: Sep 13, 2010 Initial call taken by: Barnie Mort,  July 07, 2010 12:48 PM  Follow-up for Phone Call         we have not RF l Zoloft in a while , if he is depress , he needs to restart Zoloft.  call in 100 mg tablets #30, 3 RF. Restart half tablet daily for one week, then one tablet a day.  if he is getting worse or he express any suicidal thoughts, he needs to be seen immediately (here or at the ER) Malina Geers E. Mayley Lish MD  July 07, 2010 2:36 PM   Additional Follow-up for Phone Call Additional follow up Details #1::        I spoke w/  pts daughter she is aware of directions. Army Fossa CMA  July 07, 2010 2:49 PM     New/Updated Medications: ZOLOFT 100 MG TABS (SERTRALINE HCL) Restart half tablet daily for one week, then one tablet a day. Prescriptions: ZOLOFT 100 MG TABS (SERTRALINE HCL) Restart half tablet daily for one week, then one tablet a day.  #30 x 3   Entered by:   Army Fossa CMA   Authorized by:   Nolon Rod. Ellana Kawa MD   Signed by:   Army Fossa CMA on 07/07/2010   Method used:   Electronically [facility]        [facility] [phone]* (retail)       [address]       [place], [place]  [address]       Ph: [phone] or [phone]       Fax: [phone]   RxID:   [id number]

## 2010-07-20 ENCOUNTER — Other Ambulatory Visit: Payer: Self-pay | Admitting: Internal Medicine

## 2010-07-24 ENCOUNTER — Other Ambulatory Visit: Payer: Self-pay | Admitting: Internal Medicine

## 2010-07-24 NOTE — Telephone Encounter (Signed)
Ok 60, 1 RF 

## 2010-07-28 ENCOUNTER — Other Ambulatory Visit: Payer: Self-pay | Admitting: Internal Medicine

## 2010-07-28 MED ORDER — METFORMIN HCL 500 MG PO TABS: 500.0000 mg | ORAL_TABLET | Freq: Two times a day (BID) | ORAL | Status: DC

## 2010-08-02 ENCOUNTER — Encounter (HOSPITAL_COMMUNITY): Payer: Medicare Other | Admitting: Licensed Clinical Social Worker

## 2010-08-02 DIAGNOSIS — F329 Major depressive disorder, single episode, unspecified: Secondary | ICD-10-CM

## 2010-08-02 DIAGNOSIS — F3289 Other specified depressive episodes: Secondary | ICD-10-CM

## 2010-08-08 LAB — DIFFERENTIAL
Basophils Absolute: 0 10*3/uL (ref 0.0–0.1)
Lymphocytes Relative: 16 % (ref 12–46)
Monocytes Absolute: 0.5 10*3/uL (ref 0.1–1.0)
Monocytes Relative: 13 % — ABNORMAL HIGH (ref 3–12)
Neutro Abs: 2.2 10*3/uL (ref 1.7–7.7)
Neutrophils Relative %: 63 % (ref 43–77)

## 2010-08-08 LAB — COMPREHENSIVE METABOLIC PANEL
Albumin: 3.2 g/dL — ABNORMAL LOW (ref 3.5–5.2)
Alkaline Phosphatase: 114 U/L (ref 39–117)
BUN: 16 mg/dL (ref 6–23)
CO2: 25 mEq/L (ref 19–32)
Chloride: 110 mEq/L (ref 96–112)
GFR calc non Af Amer: >60 mL/min (ref >60)
Glucose, Bld: 89 mg/dL (ref 70–99)
Potassium: 4.3 mEq/L (ref 3.5–5.1)
Total Bilirubin: 0.9 mg/dL (ref 0.3–1.2)

## 2010-08-08 LAB — GLUCOSE, CAPILLARY
Glucose-Capillary: 114 mg/dL — ABNORMAL HIGH (ref 70–99)
Glucose-Capillary: 70 mg/dL (ref 70–99)

## 2010-08-08 LAB — CBC
Hemoglobin: 13.9 g/dL (ref 13.0–17.0)
RBC: 4.63 MIL/uL (ref 4.22–5.81)
RDW: 14.9 % (ref 11.5–15.5)

## 2010-08-08 LAB — URINALYSIS, ROUTINE W REFLEX MICROSCOPIC
Bilirubin Urine: NEGATIVE
Ketones, ur: NEGATIVE mg/dL
Nitrite: NEGATIVE
Protein, ur: NEGATIVE mg/dL
Urobilinogen, UA: 1 mg/dL (ref 0.0–1.0)

## 2010-08-08 LAB — HEMOGLOBIN A1C
Hgb A1c MFr Bld: 5.8 % (ref 4.6–6.1)
Mean Plasma Glucose: 120 mg/dL

## 2010-08-08 LAB — URINE MICROSCOPIC-ADD ON

## 2010-08-08 LAB — POCT CARDIAC MARKERS
CKMB, poc: <1 ng/mL — ABNORMAL LOW (ref 1.0–8.0)
Troponin i, poc: <0.05 ng/mL (ref 0.00–0.09)

## 2010-08-19 ENCOUNTER — Other Ambulatory Visit: Payer: Self-pay | Admitting: Internal Medicine

## 2010-08-25 ENCOUNTER — Encounter: Payer: Self-pay | Admitting: Internal Medicine

## 2010-08-25 ENCOUNTER — Ambulatory Visit (INDEPENDENT_AMBULATORY_CARE_PROVIDER_SITE_OTHER): Payer: Medicare Other | Admitting: Internal Medicine

## 2010-08-25 ENCOUNTER — Telehealth: Payer: Self-pay | Admitting: Internal Medicine

## 2010-08-25 DIAGNOSIS — E119 Type 2 diabetes mellitus without complications: Secondary | ICD-10-CM

## 2010-08-25 NOTE — Assessment & Plan Note (Signed)
Diabetes under excellent control, now presents with hypoglycemia. We'll discontinue metformin. Patient will call with CBGs next week. His last A1c was ~6.  We'll set a  A1c goal of 7-8 giving his age. Even a slightly higher A1C will be acceptable, he is elderly and lives only with  his disable daughter.

## 2010-08-25 NOTE — Progress Notes (Signed)
  Subjective:    Patient ID: Russell [name], male    DOB: 04/21/1924, 77 y.o.   MRN: [id number]  HPI Here with his 2 daughters, for the last 2 weeks he has noted his blood sugars to be low in the mornings, around 40-50, the only symptom that he has is mild nervousness. He denies any dizziness, diaphoresis or loss of consciousness. Usually low sugar respond to orange juice and it goes up to the 90s. On further questioning, his blood sugars are also in the low side the rest of the day.  Past Medical History  Diagnosis Date  . Diabetes mellitus   . Hyperlipidemia   . Hypertension   . Dementia   . Anxiety associated with depression   . Colon cancer     s/p resection 12/06  . Prostate cancer   . Urolithiasis   . History of cystoscopy     uretheral stricutre, 2005- Dr.Humphreys  . FTT (failure to thrive)     admited 08/2008  . Osteoarthritis    Past Surgical History  Procedure Date  . Hemicolectomy 12/06  . Transurethral resection of prostate   . Xrt     ERT for prostate ca     Review of Systems Denies any change in his medications. His diet has not changed, his appetite is great. No fevers No nausea, vomiting, diarrhea. No dysuria or gross hematuria.      Objective:   Physical Exam  Constitutional: He is oriented to person, place, and time. He appears well-developed and well-nourished.  Cardiovascular: Normal rate, regular rhythm and normal heart sounds.   Pulmonary/Chest: Breath sounds normal. No respiratory distress.  Neurological: He is alert and oriented to person, place, and time.          Assessment & Plan:

## 2010-08-25 NOTE — Patient Instructions (Signed)
Please review all the medications to be sure he is only taking what is prescribed. Discontinue metformin Call next week week the blood sugar readings.

## 2010-08-25 NOTE — Telephone Encounter (Signed)
Patient's daughter Steward Drone called to report that patient's blood sugar has been dropping down to the low numbers ----40's and 50's---she would like a phone call  If patient needs to be seen, he needs a morning appointment or an early afternoon---has to be home by 3:00

## 2010-08-25 NOTE — Telephone Encounter (Signed)
Spoke w/ pt son in law informed appt needed scheduled to see Dr. Drue Novel

## 2010-09-06 ENCOUNTER — Telehealth: Payer: Self-pay | Admitting: *Deleted

## 2010-09-06 NOTE — Telephone Encounter (Signed)
Spoke w/ pt daughter to says that pt stated that his blood sugar has been running between 95-132. Would like to know if he should still just checking or would you like to restart metformin.

## 2010-09-06 NOTE — Telephone Encounter (Signed)
Dr. Paz pls advise.  

## 2010-09-07 NOTE — Telephone Encounter (Signed)
Pt aware of information.  

## 2010-09-07 NOTE — Telephone Encounter (Signed)
No need to restart metformin. If the blood sugar is consistently more than 180, need to let us know

## 2010-09-12 NOTE — Consult Note (Signed)
NAMEOMEGA, SLAGER NO.:  [id number]   MEDICAL RECORD NO.:  [id number]          PATIENT TYPE:  OBV   LOCATION:  3712                         FACILITY:  [facility]   PHYSICIAN:  Antonietta Breach, M.D.  DATE OF BIRTH:  [birth year]   DATE OF CONSULTATION:  09/08/2008  DATE OF DISCHARGE:                                 CONSULTATION   REASON FOR CONSULTATION:  Depression.   HISTORY OF PRESENT ILLNESS:  Mr. Russell Russell [name] is an 77 year old male  admitted to the [facility] on Sep 07, 2008, due to the failure to  thrive.   Mr. Russell Russell [name] states that, since his wife passed away in the fall, he has  been experiencing depression.  He has had difficulty with energy,  decreased concentration, reduction of interests.  He also has required  Xanax at night for insomnia and feeling on edge and muscle tension.   He has not been having any suicidal thoughts.  He does not have thoughts  of harming others.  He does have intact orientation and memory function.  He has no hallucinations or delusions.   He has been on Zoloft 25 mg b.i.d.  Associated with Zoloft has been a  partial improvement in his depressive symptoms.  He does describe  interest in his religion, as well as hope.  He feels religious peace.  He is not having any adverse effects from the Zoloft.   Please see the MAR medication discussion below.   PAST PSYCHIATRIC HISTORY:  Mr. Russell Russell [name] has suffered recurrent periods of  depression in the past.  He has been on other antidepressant  medications.  He cannot recall them.   He does have a history of excessive worry, feeling on edge and muscle  tension, which has been treated with Xanax for acute symptoms, along  with the Zoloft.   He has never had any psychiatric admissions.  He has never tried to kill  himself.   FAMILY PSYCHIATRIC HISTORY:  He has a sister with schizophrenia.  She  has chronic disability from her schizophrenia.  He has a daughter with  depression.   SOCIAL HISTORY:  Mr. Russell Russell [name] is widowed.  He lives at home with his  schizophrenic sister.  She often talks to no one in the room.   He does not use alcohol or illegal drugs.  He is retired.  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  He states that, if he cannot drive to church  back-and-forth, there is church [employer] that can take him.  Also his next-  door neighbor is a member there and they also have offered to take him.   PAST MEDICAL HISTORY:  Insulin-dependent diabetes mellitus,  hypertension, hypercholesterolemia.   MEDICATIONS:  MAR is reviewed.  1. Zoloft 25 mg b.i.d.  2. Exelon 9.5 mg daily.  3. Xanax 0.25-0.5 mg p.o. q.6 h p.r.n.  4. Ativan is also on the MAR at 0.5 mg t.i.d. p.r.n.  5. Ambien 5 mg q.h.s. p.r.n.   He has received Ambien last night.  He also received a dose of Xanax  0.25 mg  yesterday.   ALLERGIES:  NO KNOWN DRUG ALLERGIES.   LABORATORY DATA:  TSH is normal.  WBC 3.5, hemoglobin 13.9, platelet  count 185.  Hemoglobin A1c was normal.  Sodium 140, BUN 16, creatinine  0.8, glucose 89, SGOT 44, SGPT 34.   REVIEW OF SYSTEMS:  CONSTITUTIONAL, HEAD, EYES, EARS, NOSE, THROAT,  MOUTH, NEUROLOGIC, PSYCHIATRIC, CARDIOVASCULAR, RESPIRATORY,  GASTROINTESTINAL, GENITOURINARY, SKIN, MUSCULOSKELETAL, HEMATOLOGIC  LYMPHATIC, ENDOCRINE, METABOLIC all unremarkable.   EXAMINATION:  VITAL SIGNS:  Temperature 97.9, pulse 63, respiratory rate  18, blood pressure 136/72, O2 saturation on room air 98%.  GENERAL APPEARANCE:  Mr. Russell Russell [name] is an elderly male, sitting up in his  hospital chair with no abnormal involuntary movements.   MENTAL STATUS EXAM:  Mr. Russell Russell [name] is alert.  His eye-contact is good.  His  attention span is slightly decreased.  Concentration is normal.  His  affect is mildly constricted at baseline.  However, he does smile  appropriately and congruently to conversation content.  His mood is  within normal limits.  His orientation is completely intact to the year,  month, day  of the month, day of the week, place and person.  His memory  function is intact to immediate, recent and remote.  Fund of knowledge  and intelligence are within normal limits.   Speech involves normal rate and prosody without dysarthria.  Thought  process is logical, coherent, goal-directed.  No looseness of  associations.  Thought content:  No thoughts of harming himself or  others, no delusions or hallucinations.  His insight is intact.  Judgment is intact.   ASSESSMENT:  AXIS I:  293.83 mood disorder, not otherwise specified  (idiopathic and general medical factors), depressed.  Major depressive  disorder, recurrent, in partial remission.  293.84 anxiety disorder, not  otherwise specified, rule out generalized anxiety disorder.  Mr. Russell Russell [name]  has had partial improvement in his depressive symptoms.  He still is  requiring Xanax p.r.n. anxiety, as well as p.r.n. insomnia.  His Zoloft  dosage has been 50 mg per day.  AXIS II:  None.  AXIS III:  See past medical history.  AXIS IV:  General medical, primary support group.  AXIS V:  55.   Mr. Russell Russell [name] is not at risk to harm himself or others.  He agrees to call  emergency services immediately for any thoughts of harming himself or  others or other emergency symptoms.   Mr. Russell Russell [name] requested that his daughter attend the session and gave  permission for her to be there to facilitate education and support.   The undersigned provided ego support and education.   The indications, alternatives and adverse effects of the following were  discussed with the patient:  Zoloft for anti-depression antianxiety, as  well as Xanax for anti-acute anxiety and insomnia, including the risk of  dependence, falls, excessive sedation, cognitive and memory dulling   Mr. Russell Russell [name] understands the above information and wants to proceed as  below.   RECOMMENDATIONS:  1. Would increase his Zoloft to 100 mg p.o. q.a.m. at the rate of 25      mg every 3 days as  tolerated.  After 4 weeks if he only has partial      improvement, would increase it further to 150 mg q.a.m.  If no      improvement after 4 weeks, he will need psychiatric follow-up,      regardless, and his psychiatrist could then augment his treatment.   Would continue judicious use of his  Xanax with a goal of eventually  eliminating Xanax use as the Zoloft becomes more effective for anxiety.   Zoloft effectiveness for both somatic and cognitive symptoms of anxiety  generally requires at least 16 weeks.   The efficacy of treating anxiety is better when combined with cognitive  behavioral therapy, deep breathing and progressive muscle relaxation.   Regarding the Xanax, would continue with 0.25 mg  to 0.5 mg b.i.d.  p.r.n. and 0.25 to 1 mg p.o. q.h.s. p.r.n. insomnia.   Would ask the social worker who set Mr. Tissue up with outpatient  psychotropic medication management and outpatient counseling.   Psychiatric clinics are available at [facility], [facility] and  [facility].   Another note regarding Zoloft, while increasing the Zoloft, would  monitor for loose stools or nausea as side effects.      Antonietta Breach, M.D.  Electronically Signed     JW/MEDQ  D:  09/08/2008  T:  09/08/2008  Job:  [id number]

## 2010-09-12 NOTE — Discharge Summary (Signed)
Russell [name], Russell [name]                ACCOUNT NO.:  [id number]   MEDICAL RECORD NO.:  [id number]          PATIENT TYPE:  OBV   LOCATION:  3712                         FACILITY:  [facility]   PHYSICIAN:  Raenette Rover. Felicity Coyer, MDDATE OF BIRTH:  1923-10-15   DATE OF ADMISSION:  09/07/2008  DATE OF DISCHARGE:  09/08/2008                               DISCHARGE SUMMARY   DISCHARGE DIAGNOSES:  1. Failure to thrive multifactorial, stable and safe for discharge      home.  See details below.  2. Depression, recurrent, status post Psychiatry evaluation with      medication changes as recommended.  3. Hypertension.  4. Type 2 diabetes.  5. Dyslipidemia.  6. History of colon cancer status post right hemicolectomy December      2006.  7. Remote history of prostate cancer.  No active disease.  8. Dementia, mild.   Outpatient followup with Dr. Andee Poles as needed.   DISCHARGE MEDICATIONS:  Zoloft 75 mg daily x3 days then increase to 100  mg daily.  The patient currently with 25 mg tablet.  Prescription at  home.  New prescription for 100 mg strength provided today.   Other medications are as prior to admission and include:  1. Norvasc 10 mg daily.  2. Metformin 500 mg tablets two in the a.m., 1 in p.m.  3. Zocor 80 mg at bedtime.  4. Benazepril 20 mg daily.  5. Aspirin 81 mg daily.  6. Xanax 0.5 mg tablets one half to one every 6 hours as needed for      anxiety.  7. Exelon 9.5 mg strength patch change nightly.  8. Rapaflo 8 mg one p.o. daily.   DISPOSITION:  The patient is discharged home where he lives with a  schizophrenic daughter in her 14s.  He also has close supervision from  other daughters and granddaughters nearby.  He was recently widowed in  September 2009 and still struggling with grief reaction related to same.  He was instructed not to drive as he has been told by his neurologist  prior to this time pending further followup.   HOSPITAL COURSE BY PROBLEM:  Failure to thrive with  depression and  dementia.  The patient is an 77 year old gentleman with ongoing grief  reaction following the unexpected death of his wife in the fall  September 2009 after a pacemaker procedure placement.  Since that time  he has been having slowly declining health with decreased interest and  activities, decreased involvement and physical activity.  He is also  under a lot of stress as he is now the primary person who helps  supervise for his 75 year old schizophrenic daughter who lives with him  when she is not at her group home.  During the day he has other  supportive daughters nearby who assist with his care but he is also  recently have been told that he is not safe to drive due to decreased  reaction times and progressive dementia by his neurologist Dr. Terrace Arabia.  On  this day of admission, he was brought to [facility] emergency room  because  the daughter was concerned that he was less responsive than  usual and had complained of not feeling well.  His apartment was found  to be hot and he said he had been chilled though in the emergency room  no other specific abnormalities could be identified.  He was admitted  for further observation and evaluation.  Overnight, he had no cardiac  complaints.  His CBC was normal.  Chest x-ray was unremarkable and he  was hemodynamically stable with no evidence of fever.  No antibiotics  were given.  He was also seen in consultation by Psychiatry who  recommended continued increased titration of his ongoing Zoloft  treatment with judicious use of p.r.n. Xanax.  Please see his consult  note also found in e-chart by Dr. Jeanie Sewer for further details on this.  If there is no improvement in his symptoms after 4 weeks, he is to  increase his Zoloft to 150 mg daily or consider further evaluation and  assistance with Psychiatry for medical management.  He has been seen by  PT and OT and has no acute needs but is felt stable for discharge home  at this time  to continue ongoing medical management of his other chronic  issues as ongoing prior to admission.  Please see hospital chart for  full details.      Valerie A. Felicity Coyer, MD  Electronically Signed     VAL/MEDQ  D:  09/08/2008  T:  09/09/2008  Job:  [id number]

## 2010-09-13 ENCOUNTER — Ambulatory Visit: Payer: Self-pay | Admitting: Internal Medicine

## 2010-09-15 NOTE — Op Note (Signed)
NAMEDONAVYN, Russell [name]                ACCOUNT NO.:  [id number]   MEDICAL RECORD NO.:  [id number]          PATIENT TYPE:  INP   LOCATION:  1616                         FACILITY[facility]  [facility]   PHYSICIAN:  Vikki Ports, MDDATE OF BIRTH:  25-Oct-1923   DATE OF PROCEDURE:  04/03/2005  DATE OF DISCHARGE:                                 OPERATIVE REPORT   PREOPERATIVE DIAGNOSIS:  Right colon cancer.   POSTOPERATIVE DIAGNOSIS:  Right colon cancer.   PROCEDURE:  Laparoscopic assisted right hemicolectomy.   SURGEON:  Danna Hefty, MD.   ASSISTANTThornton Park. Daphine Deutscher, MD.   ANESTHESIA:  General.   DESCRIPTION OF PROCEDURE:  The patient was taken to the operating room,  placed in supine position and after adequate general anesthesia was induced  using the endotracheal tube, a Foley catheter was placed and the abdomen was  prepped and draped in the normal sterile fashion. Using an 11 mm left upper  quadrant incision and the Optiview technique, peritoneal access was obtained  under direct vision. Pneumoperitoneum was obtained. Additional 10 and 5 mm  trocars were placed in the abdomen. Under direct vision, the right colon was  identified and mobilized using the Harmonic scalpel. An additional 5 mm  trocar was placed in the left abdomen. The hepatic flexure was mobilized,  however, was quite adherent to the liver and retroperitoneum. This was  mobilized to the transverse mesocolon. After a fairly extensive  mobilization, the tattoo could not be easily visualized. At this point, a  transverse incision was made in the right upper quadrants dissecting down  through muscular tissue using Bovie electrocautery. The abdomen was entered.  The tumor could be palpated in the mid right colon just proximal to the  hepatic flexure. The remainder of the hepatic flexure was mobilized off the  liver. The proximal transverse colon was transected using the GIA stapling  device as was the terminal ileum.  The mesentery was taken down between [facility]  clamps and the specimen was passed off the table. A side-to-side  ileocolostomy was performed in the standard fashion using a GIA 75 and a TA  stapling device. The mesentery was closed with interrupted 3-0 sutures. The  anastomosis was reinforced using Tisseel. The abdomen was copiously  irrigated. The fascia was closed in two layers using running #1 Novofil. The  skin incisions were closed with staples. Sterile dressings were applied. The  patient tolerated the procedure well and went to PACU in good condition.      Vikki Ports, MD  Electronically Signed     KRH/MEDQ  D:  04/03/2005  T:  04/04/2005  Job:  [id number]

## 2010-09-15 NOTE — Op Note (Signed)
NAME:  Russell [name], Russell [name]                ACCOUNT NO.:  [id number]   MEDICAL RECORD NO.:  [id number]          PATIENT TYPE:  AMB   LOCATION:  [facility]                         FACILITY:  [facility]   PHYSICIAN:  [place] Service, M.D.DATE OF BIRTH:  01-01-1924   DATE OF PROCEDURE:  03/07/2004  DATE OF DISCHARGE:                                 OPERATIVE REPORT   PREOPERATIVE DIAGNOSES:  1.  Urolithiasis.  2.  Prostate cancer.  3.  Urethral stricture and obstructive voiding symptoms.  4.  Kidney stones.   References made to office notes from October 2004, October 2005, CT scan  from October 2005 and notes from May 2004, October 2005 and November 2005.   POSTOPERATIVE DIAGNOSES:  1.  Cancer prostate.  2.  Urethral stricture.  3.  Kidney stones.   PROCEDURE:  Cystoscopy, direct vision internal urethrotomy, bilateral  retrogrades.   ANESTHESIA:  General.   DRAINS:  20 French silicone Foley.   DESCRIPTION OF PROCEDURE:  The patient was prepped and draped in the dorsal  lithotomy position after institution of an adequate level of general  anesthesia.  A well lubricated 21 French panendoscope was gently inserted at  the urethral meatus, normal penile and bulbous urethra, proximal bulbous  urethra, however, showed an impassable stricture. Urethrotome was selected.  The 3 French ureteral  catheter passed through the narrow portion of the  stricture, appeared to advance nicely into the bladder, half moon  urethrotome was selected. The stricture was incised at the 12 o'clock  position through ring of fibrotic tissue down to pink tissue of the corpus  spongiosum. The urethrotome then advance easily into the bladder, normal  membranous urethra, verumontanum is intact, prostatic urethra showed  evidence of TURP.  The bladder showed mild changes of radiation consistent  with prior ERT for cancer of the prostate.  Urothelium, however, showed no  evidence of tumor, stone or other anatomic abnormality.  Right left  retrogrades were performed, there was mild J deformity to the distal  ureters. Physiologic dilation of the ureters as they passed the femoral  vessels. The patient has extra renal pelvis bilaterally with a  nonobstructive stone  on the lower pole of the right kidney.  Prompt drainage at 3-5 minutes on  both sides.  The bladder was filled to capacity, urethrotome was withdrawn,  20 [place] silicone catheter was inserted and left to straight drain.  The  patient was then returned to recovery in satisfactory condition.      RH/MEDQ  D:  03/07/2004  T:  03/07/2004  Job:  [id number]   cc:   Marcene Duos, M.D.  [facility]  [place]  [place] [address]  Fax: [phone]

## 2010-09-15 NOTE — Discharge Summary (Signed)
NAMEVYRON, Russell [name]                ACCOUNT NO.:  [id number]   MEDICAL RECORD NO.:  [id number]          PATIENT TYPE:  INP   LOCATION:  1616                         FACILITY:  [facility]   PHYSICIAN:  Vikki Ports, MDDATE OF BIRTH:  1923/12/14   DATE OF ADMISSION:  04/03/2005  DATE OF DISCHARGE:  04/11/2005                                 DISCHARGE SUMMARY   ADMISSION DIAGNOSIS:  Colon cancer.   DISCHARGE DIAGNOSIS:  Colon cancer.   CONDITION ON DISCHARGE:  Good and improved.   FOLLOW UP:  Follow up with me in 3 weeks.   DISCHARGE MEDICATIONS:  1.  Diltiazem 300 mg p.o. daily.  2.  Diltiazem CD 240 mg p.o. daily.  3.  Cardura 2 mg p.o. daily.  4.  Lipitor 80 mg p.o. daily.  5.  Metformin 1 g p.o. daily.  6.  Tylenol for pain.   HOSPITAL COURSE:  The patient was admitted after home bowel prep and taken  to the operating room where he underwent laparoscopic-assisted right  hemicolectomy.  Postoperatively, he was maintained on sliding scale insulin  and did well.  He did have a fairly prolonged ileus with slightly distended  abdomen and was maintained on clear liquids for about 1 week.  However, by  postop day #6, he was started on a soft diet and advanced to a regular diet.  By postop day #8, he was tolerating a regular diet and was ready for  discharge home.  Staples were removed and he will follow up with me in 3  weeks.      Vikki Ports, MD  Electronically Signed     KRH/MEDQ  D:  04/11/2005  T:  04/12/2005  Job:  [id number]

## 2010-09-29 ENCOUNTER — Encounter: Payer: Self-pay | Admitting: Internal Medicine

## 2010-09-29 ENCOUNTER — Ambulatory Visit (INDEPENDENT_AMBULATORY_CARE_PROVIDER_SITE_OTHER): Payer: Medicare Other | Admitting: Internal Medicine

## 2010-09-29 DIAGNOSIS — E119 Type 2 diabetes mellitus without complications: Secondary | ICD-10-CM

## 2010-09-29 DIAGNOSIS — I498 Other specified cardiac arrhythmias: Secondary | ICD-10-CM

## 2010-09-29 DIAGNOSIS — I10 Essential (primary) hypertension: Secondary | ICD-10-CM

## 2010-09-29 LAB — BASIC METABOLIC PANEL
CO2: 26 mEq/L (ref 19–32)
Calcium: 8.9 mg/dL (ref 8.4–10.5)
Chloride: 105 mEq/L (ref 96–112)
Glucose, Bld: 108 mg/dL — ABNORMAL HIGH (ref 70–99)
Sodium: 139 mEq/L (ref 135–145)

## 2010-09-29 LAB — HEMOGLOBIN A1C: Mean Plasma Glucose: 128 mg/dL — ABNORMAL HIGH (ref <117)

## 2010-09-29 NOTE — Assessment & Plan Note (Signed)
Long history of bigeminy, he was evaluated by cardiology, Dr. Graciela Husbands in 2010, his assessment was: "The electrocardiogram from 10 4 and a Holter monitor or both reviewed. As noted there are approximately 3000 PVCs per hour representing about 3% of his daily heartbeats. The cardiogram demonstrated a left bundle superior axis morphology PVC. On the Holter there were patterns of bigeminy trigeminy and quadrigeminy. As the patient had no associated symptoms, the only indication for therapy would be in the event of secondary cardiomyopathy. we'll obtain a 2-D echo." ECHO 04-26-2009----->  was essentially okay, EF of 55%, see report

## 2010-09-29 NOTE — Progress Notes (Signed)
  Subjective:    Patient ID: Russell [name], male    DOB: 1923/11/14, 77 y.o.   MRN: [id number]  HPI Followup from last office visit, Glucophage was discontinued due to the low blood sugars. Past Medical History  Diagnosis Date  . Diabetes mellitus   . Hyperlipidemia   . Hypertension   . Dementia   . Anxiety associated with depression   . Colon cancer     s/p resection 12/06  . Prostate cancer   . Urolithiasis   . History of cystoscopy     uretheral stricutre, 2005- Dr.Humphreys  . FTT (failure to thrive)     admited 08/2008  . Osteoarthritis    Past Surgical History  Procedure Date  . Hemicolectomy 12/06  . Transurethral resection of prostate   . Xrt     ERT for prostate ca     Review of Systems Feeling well, blood sugars usually between 78 and 114. Denies any weakness, sweats, near syncope. Does not check blood pressures at home.     Objective:   Physical Exam  Constitutional: He appears well-developed and well-nourished. No distress.  HENT:  Head: Normocephalic and atraumatic.  Cardiovascular: Normal rate and normal heart sounds.   No murmur heard.      Heartbeats are regular with occasional skip, history of bigeminy  Pulmonary/Chest: Effort normal and breath sounds normal. No respiratory distress. He has no rales.  Musculoskeletal: He exhibits no edema.  Skin: He is not diaphoretic.          Assessment & Plan:

## 2010-09-29 NOTE — Assessment & Plan Note (Addendum)
Off metformin, now w/ no sx c/w low sugar, labs

## 2010-09-29 NOTE — Assessment & Plan Note (Signed)
At goal Labs

## 2010-10-02 ENCOUNTER — Encounter (HOSPITAL_COMMUNITY): Payer: Medicare Other | Admitting: Licensed Clinical Social Worker

## 2010-10-02 ENCOUNTER — Telehealth: Payer: Self-pay | Admitting: *Deleted

## 2010-10-02 NOTE — Telephone Encounter (Signed)
Message copied by Leanne Lovely on Mon Oct 02, 2010  1:47 PM ------      Message from: Russell [name]      Created: Mon Oct 02, 2010  1:02 PM       Advise patient, diabetes well controlled. Good results

## 2010-10-02 NOTE — Telephone Encounter (Signed)
Message left for patient to return my call.  

## 2010-10-03 NOTE — Telephone Encounter (Signed)
Unable to leave message

## 2010-10-04 NOTE — Telephone Encounter (Signed)
Pt is aware.  

## 2010-10-24 ENCOUNTER — Encounter (HOSPITAL_BASED_OUTPATIENT_CLINIC_OR_DEPARTMENT_OTHER): Payer: Medicare Other | Admitting: Licensed Clinical Social Worker

## 2010-10-24 DIAGNOSIS — F329 Major depressive disorder, single episode, unspecified: Secondary | ICD-10-CM

## 2010-10-31 ENCOUNTER — Other Ambulatory Visit: Payer: Self-pay | Admitting: Internal Medicine

## 2010-11-09 ENCOUNTER — Other Ambulatory Visit: Payer: Self-pay | Admitting: Internal Medicine

## 2010-11-13 ENCOUNTER — Other Ambulatory Visit: Payer: Self-pay | Admitting: Internal Medicine

## 2010-11-15 NOTE — Telephone Encounter (Signed)
60, 1 RF 

## 2010-11-22 ENCOUNTER — Encounter (HOSPITAL_COMMUNITY): Payer: Medicare Other | Admitting: Psychiatry

## 2010-11-22 DIAGNOSIS — F329 Major depressive disorder, single episode, unspecified: Secondary | ICD-10-CM

## 2010-11-23 ENCOUNTER — Ambulatory Visit (INDEPENDENT_AMBULATORY_CARE_PROVIDER_SITE_OTHER): Payer: Medicare Other | Admitting: Internal Medicine

## 2010-11-23 ENCOUNTER — Encounter: Payer: Self-pay | Admitting: Internal Medicine

## 2010-11-23 VITALS — BP 126/76 | HR 82 | Wt 176.4 lb

## 2010-11-23 DIAGNOSIS — I498 Other specified cardiac arrhythmias: Secondary | ICD-10-CM

## 2010-11-23 DIAGNOSIS — E119 Type 2 diabetes mellitus without complications: Secondary | ICD-10-CM

## 2010-11-23 DIAGNOSIS — R001 Bradycardia, unspecified: Secondary | ICD-10-CM

## 2010-11-23 LAB — TSH: TSH: 2.13 u[IU]/mL (ref 0.35–5.50)

## 2010-11-23 NOTE — Progress Notes (Signed)
Russell [name], Russell [name]                ACCOUNT NO.:  [id number]  MEDICAL RECORD NO.:  [id number]  LOCATION:  [facility]                           FACILITY:  [facility]  PHYSICIAN:  Montrelle Eddings T. Sister Carbone, M.D.   DATE OF BIRTH:  07-13-1923                                PROGRESS NOTE   The patient came in today for his appointment.  He was last seen in September 2011.  The patient came today with his daughter for his appointment.  Apparently the patient was told to see a psychiatrist last month by a family member has the patient was having dizzy spells, daughter endorsed.  At times he looked spacey and blank to process any information, but he denies any suicidal thoughts, depression, crying spells, insomnia, agitation or anger.  He has been taking his medication, Zoloft and Xanax, from the primary care doctor, Dr. Drue Novel, and reported no side effects of medication.  Daughter endorsed that his depression has been much better.  He denies any suicidal thinking and more involved in his daily activities.  MEDICAL HISTORY: The patient has history of diabetes, high cholesterol, hypertension.  He sees Dr. Drue Novel at [facility] in [place].  The patient and his daughter do not remember the details of the medication, but apparently the patient is taking: 1. Simvastatin 40 mg. 2. Aspirin low-dose. 3. Zoloft 100 mg daily. 4. Xanax 0.5 mg daily as needed. 5. Recently his metformin has been discontinued by his primary care     doctor.  VITAL SIGNS: The patient's blood pressure is 122/52.  His weight is 178.6.  His height is 5 feet 5 inches, and his heart rate is 48-50 with at times irregular beats.  The patient reported no chest pain, chest pressure or dizziness.  He also reported no headache, but apparently there have been episodes when he has blanked out and stares to the people.  MENTAL STATUS EXAM: The patient is cooperative, pleasant.  He still has difficulty to provide information.  Most of the  information was obtained from his daughter.  He is casually dressed.  He has fair dental hygiene.  His speech is soft but unproductive.  His attention and concentration was poor.  His thought process was slow but logical, goal-directed.  He has limited memory and recall from the past.  There was no delusion, psychosis or obsession present.  He denies any auditory hallucinations, suicidal thoughts or homicidal thoughts.  He is alert and oriented x3. His insight, judgment, impulse control were okay.  DIAGNOSES: Axis I:  Depressive disorder, not otherwise specified.  Rule out cognitive disorder. Axis III:  See medical history.  PLAN: The patient has been getting his Zoloft and Xanax from his primary care doctor.  I talked to the patient at length about his physical symptoms including his irregular heartbeat and those spells when the patient is staring at the people.  The patient will need further workup to rule out any organic cause of these spells.  I recommended the patient and his daughter to get in touch with the primary care doctor and possible neurology consult to rule out any organic cause for these spells.  The patient will continue  to follow with Geanie Berlin in this office for his regular counseling and therapy sessions.  The patient and his daughter do not want to continue to see this Clinical research associate as they have been getting medication from the primary care doctor.  However, if in the future they need any help, they will call us.  I have also recommended that if they feel that their depression medicine is not working very well, then they should call us for the future appointment.     Camaryn Lumbert T. Lolly Mustache, M.D.     STA/MEDQ  D:  11/22/2010  T:  11/23/2010  Job:  [id number]  Electronically Signed by Kathryne Sharper M.D. on 11/23/2010 04:08:21 PM

## 2010-11-23 NOTE — Assessment & Plan Note (Signed)
See history of present illness, of metformin occasionally he has high sugars depending on diet. Counseled about a healthy diet

## 2010-11-23 NOTE — Progress Notes (Signed)
  Subjective:    Patient ID: Russell [name], male    DOB: [birth year], 77 y.o.   MRN: [id number]  HPI   Here w/ 2 family members. Yesterday at his therapist office his heart rate was noted to be 48 and a blood pressure 122/52. They went to a pharmacy and recheck his vital signs. Blood pressure was 124/65 pulse of 70. They are concerned about bradycardia. Also, we discontinue metformin due to symptoms consistent with hypoglycemia, symptoms resolved. Currently blood sugar varies from 100 to more than 200 depending on diet.  Past Medical History  Diagnosis Date  . Diabetes mellitus   . Hyperlipidemia   . Hypertension   . Dementia   . Anxiety associated with depression   . Colon cancer     s/p resection 12/06  . Prostate cancer   . Urolithiasis   . History of cystoscopy     uretheral stricutre, 2005- Dr.Humphreys  . FTT (failure to thrive)     admited 08/2008  . Osteoarthritis    Past Surgical History  Procedure Date  . Hemicolectomy 12/06  . Transurethral resection of prostate   . Xrt     ERT for prostate ca    Review of Systems No chest pain or shortness of breath. No lower extremity edema No pre-syncope or syncope type symptoms. No orthostatic weakness, no dizziness. In general he feels good     Objective:   Physical Exam  Constitutional: He is oriented to person, place, and time. He appears well-developed and well-nourished. No distress.  Neck:       Normal carotid pulses  Cardiovascular: Normal rate, regular rhythm and normal heart sounds.   No murmur heard. Pulmonary/Chest: Effort normal and breath sounds normal. No respiratory distress. He has no wheezes. He has no rales.  Musculoskeletal: He exhibits no edema.  Neurological: He is alert and oriented to person, place, and time.  Skin: He is not diaphoretic.          Assessment & Plan:

## 2010-11-23 NOTE — Assessment & Plan Note (Addendum)
Long history of bigeminy, chart reviewed:  Long history of bigeminy, he was evaluated by cardiology, Dr. Graciela Husbands in 2010, his assessment was:  "The electrocardiogram from 10 4 and a Holter monitor or both reviewed. As noted there are approximately 3000 PVCs per hour representing about 3% of his daily heartbeats. The cardiogram demonstrated a left bundle superior axis morphology PVC. On the Holter there were patterns of bigeminy trigeminy and quadrigeminy.  As the patient had no associated symptoms, the only indication for therapy would be in the event of secondary cardiomyopathy. we'll obtain a 2-D echo."  ECHO 04-26-2009-----> was essentially okay, EF of 55%, see report  Today's EKG is not far from baseline. Patient is asymptomatic Plan: Check a TSH Repeat ECHO

## 2010-11-28 ENCOUNTER — Ambulatory Visit (HOSPITAL_COMMUNITY): Payer: Medicare Other | Attending: Internal Medicine | Admitting: Radiology

## 2010-11-28 DIAGNOSIS — I495 Sick sinus syndrome: Secondary | ICD-10-CM

## 2010-11-28 DIAGNOSIS — I498 Other specified cardiac arrhythmias: Secondary | ICD-10-CM

## 2010-11-28 DIAGNOSIS — I447 Left bundle-branch block, unspecified: Secondary | ICD-10-CM

## 2010-11-28 DIAGNOSIS — E119 Type 2 diabetes mellitus without complications: Secondary | ICD-10-CM | POA: Insufficient documentation

## 2010-11-28 DIAGNOSIS — I359 Nonrheumatic aortic valve disorder, unspecified: Secondary | ICD-10-CM | POA: Insufficient documentation

## 2010-11-28 DIAGNOSIS — I10 Essential (primary) hypertension: Secondary | ICD-10-CM | POA: Insufficient documentation

## 2010-11-28 DIAGNOSIS — E785 Hyperlipidemia, unspecified: Secondary | ICD-10-CM | POA: Insufficient documentation

## 2010-11-29 ENCOUNTER — Telehealth: Payer: Self-pay | Admitting: Internal Medicine

## 2010-11-29 NOTE — Telephone Encounter (Signed)
Aware- told ms herbin we would call as soon as Dr Drue Novel reviewed the results.

## 2010-12-04 NOTE — Progress Notes (Signed)
Pt's daughter aware of results °

## 2010-12-20 ENCOUNTER — Encounter (INDEPENDENT_AMBULATORY_CARE_PROVIDER_SITE_OTHER): Payer: Medicare Other | Admitting: Licensed Clinical Social Worker

## 2010-12-20 DIAGNOSIS — F329 Major depressive disorder, single episode, unspecified: Secondary | ICD-10-CM

## 2010-12-23 ENCOUNTER — Other Ambulatory Visit: Payer: Self-pay | Admitting: Internal Medicine

## 2010-12-24 ENCOUNTER — Other Ambulatory Visit: Payer: Self-pay | Admitting: Internal Medicine

## 2010-12-28 ENCOUNTER — Other Ambulatory Visit: Payer: Self-pay | Admitting: Internal Medicine

## 2011-01-11 ENCOUNTER — Other Ambulatory Visit: Payer: Self-pay | Admitting: Internal Medicine

## 2011-01-17 ENCOUNTER — Encounter: Payer: Self-pay | Admitting: Internal Medicine

## 2011-01-22 ENCOUNTER — Encounter: Payer: Self-pay | Admitting: Internal Medicine

## 2011-01-22 ENCOUNTER — Ambulatory Visit (INDEPENDENT_AMBULATORY_CARE_PROVIDER_SITE_OTHER): Payer: Medicare Other | Admitting: Internal Medicine

## 2011-01-22 ENCOUNTER — Telehealth: Payer: Self-pay | Admitting: Internal Medicine

## 2011-01-22 VITALS — BP 108/52 | HR 50 | Temp 97.7°F | Resp 14 | Ht 66.5 in | Wt 168.4 lb

## 2011-01-22 DIAGNOSIS — Z0000 Encounter for general adult medical examination without abnormal findings: Secondary | ICD-10-CM

## 2011-01-22 DIAGNOSIS — F329 Major depressive disorder, single episode, unspecified: Secondary | ICD-10-CM

## 2011-01-22 DIAGNOSIS — Z136 Encounter for screening for cardiovascular disorders: Secondary | ICD-10-CM

## 2011-01-22 DIAGNOSIS — H9190 Unspecified hearing loss, unspecified ear: Secondary | ICD-10-CM

## 2011-01-22 DIAGNOSIS — F068 Other specified mental disorders due to known physiological condition: Secondary | ICD-10-CM

## 2011-01-22 DIAGNOSIS — C189 Malignant neoplasm of colon, unspecified: Secondary | ICD-10-CM

## 2011-01-22 DIAGNOSIS — E119 Type 2 diabetes mellitus without complications: Secondary | ICD-10-CM

## 2011-01-22 DIAGNOSIS — C61 Malignant neoplasm of prostate: Secondary | ICD-10-CM

## 2011-01-22 DIAGNOSIS — I498 Other specified cardiac arrhythmias: Secondary | ICD-10-CM

## 2011-01-22 DIAGNOSIS — E785 Hyperlipidemia, unspecified: Secondary | ICD-10-CM

## 2011-01-22 LAB — CBC WITH DIFFERENTIAL/PLATELET
Basophils Absolute: 0 10*3/uL (ref 0.0–0.1)
Basophils Relative: 0.5 % (ref 0.0–3.0)
Eosinophils Absolute: 0.1 10*3/uL (ref 0.0–0.7)
Hemoglobin: 15.1 g/dL (ref 13.0–17.0)
Lymphocytes Relative: 20.7 % (ref 12.0–46.0)
MCHC: 33 g/dL (ref 30.0–36.0)
MCV: 88.1 fl (ref 78.0–100.0)
Monocytes Absolute: 0.4 10*3/uL (ref 0.1–1.0)
Neutro Abs: 2.7 10*3/uL (ref 1.4–7.7)
RBC: 5.18 Mil/uL (ref 4.22–5.81)
RDW: 15.3 % — ABNORMAL HIGH (ref 11.5–14.6)

## 2011-01-22 LAB — LIPID PANEL
Cholesterol: 151 mg/dL (ref 0–200)
LDL Cholesterol: 89 mg/dL (ref 0–99)
Triglycerides: 46 mg/dL (ref 0.0–149.0)
VLDL: 9.2 mg/dL (ref 0.0–40.0)

## 2011-01-22 LAB — AST: AST: 31 U/L (ref 0–37)

## 2011-01-22 LAB — HEMOGLOBIN A1C: Hgb A1c MFr Bld: 6.1 % (ref 4.6–6.5)

## 2011-01-22 MED ORDER — ZOSTER VACCINE LIVE 19400 UNT/0.65ML ~~LOC~~ SOLR: 0.6500 mL | Freq: Once | SUBCUTANEOUS | Status: DC

## 2011-01-22 NOTE — Telephone Encounter (Signed)
[name]  Needs to transfer to [facility] , is closer home , could you see if one of the internists will  see him ? Please call his daughter Steward Drone: [phone] and let her know Willow Ora

## 2011-01-22 NOTE — Assessment & Plan Note (Addendum)
Td  1-09 Pneumonia shot 2009 rec flu shot this October zostavax benefits discussed, Rx provided  Diet-exercise discussed Audiologist referal Would like to transfer to [place], I'll try to help him (closer to his home)

## 2011-01-22 NOTE — Assessment & Plan Note (Signed)
Fasting, due for labs

## 2011-01-22 NOTE — Progress Notes (Signed)
Subjective:    Patient ID: Russell [name], male    DOB: 08/28/1923, 77 y.o.   MRN: [id number]  HPI Here for Medicare AWV:  1. Risk factors based on Past M, S, F history: reviewed 2. Physical Activities:  Occasionally walks, active w/ home chores  3. Depression/mood: denies problems but daughter states he looks depress sometimes  4. Hearing:  States hearing is decreased----------------will refer to an audiologist 5. ADL's: independent, still drives  ; recommend to stop driving  6. Fall Risk: no recent falls but precautions discussed . 7. home Safety: does feel safe at home  8. Height, weight, &visual acuity: see VS, s/p cataracts , doing well 9. Counseling: provided 10. Labs ordered based on risk factors: if needed  11. Referral Coordination: if needed 12.  Care Plan, see assessment and plan  13.   Cognitive Assessment: seems stable although slower to answer than before   In addition, today we discussed the following: Med compliance : a daughter helps him w/ meds , states takes all meds regulalrly, Steward Drone ,his other daughter who is here with him, is not sure about compliance) DM-- amb CBGs in the 160-200 depending on diet HTN-- amb BPs very good Dementia-- on no meds, used to take exelon but couln't tolerate, memory is ok per Levonne Spiller found to be irregular, chart reviewed, history of bigeminy, status post cardiology evaluation  Past Medical History  Diagnosis Date  . Diabetes mellitus   . Hyperlipidemia   . Hypertension   . Dementia   . Anxiety associated with depression   . Colon cancer     s/p resection 12/06  . Prostate cancer   . Urolithiasis   . History of cystoscopy     uretheral stricutre, 2005- Dr.Humphreys  . FTT (failure to thrive)     admited 08/2008  . Osteoarthritis    Past Surgical History  Procedure Date  . Hemicolectomy 12/06  . Transurethral resection of prostate   . Xrt     ERT for prostate ca    FH N.c.  History   Social History  .  Marital Status: Widowed    Spouse Name: N/A    Number of Children: N/A  . Years of Education: N/A   Occupational History  . Not on file.   Social History Main Topics  . Smoking status: Former Games developer  . Smokeless tobacco: Not on file  . Alcohol Use: No  . Drug Use: No  . Sexually Active: Not on file   Other Topics Concern  . Not on file   Social History Narrative   Lost wife 9/093 daughters 1 son (1 daughter lives in [place] w/ daughter Thelma Barge who has schizophrenia since age 63      Review of Systems  Constitutional: Negative for fever.       Appetite very good  Respiratory: Negative for cough and shortness of breath.   Cardiovascular: Negative for chest pain, palpitations and leg swelling.  Gastrointestinal: Negative for abdominal pain and blood in stool.       Objective:   Physical Exam  Constitutional: He appears well-developed and well-nourished. No distress.  HENT:  Head: Normocephalic and atraumatic.  Neck: No thyromegaly present.       Normal carotid pulse  Cardiovascular:       No murmur, frequent irregular beats  Pulmonary/Chest: Effort normal and breath sounds normal. No respiratory distress. He has no wheezes. He has no rales.  Abdominal: Soft. He exhibits no distension. There is no  tenderness. There is no rebound.  Musculoskeletal:       DIABETIC FEET EXAM: No lower extremity edema Normal pedal pulses bilaterally Skin normal , small callous on the tip of the great toes  Pinprick examination of the feet w/ patchy decrease in sensitivity  Neurological: He is alert.  Skin: He is not diaphoretic.  Psychiatric: He has a normal mood and affect. His behavior is normal.          Assessment & Plan:

## 2011-01-22 NOTE — Assessment & Plan Note (Signed)
Stable

## 2011-01-22 NOTE — Assessment & Plan Note (Addendum)
Last colonoscopy 03/10/2008 had 2 polyps, tubular adenomas, next endoscopy per GI

## 2011-01-22 NOTE — Patient Instructions (Signed)
Please get your flu shot in October

## 2011-01-22 NOTE — Assessment & Plan Note (Signed)
History of bigeminy, EKG today consistent with that history. Asymptomatic. Recommend observation.

## 2011-01-22 NOTE — Assessment & Plan Note (Addendum)
On no  medications, according to the daughter he is doing well.  Observation for now

## 2011-01-22 NOTE — Assessment & Plan Note (Signed)
Sees urology routinely

## 2011-01-22 NOTE — Assessment & Plan Note (Addendum)
Well-controlled, due for labs

## 2011-01-25 NOTE — Telephone Encounter (Signed)
Patient has appt with Dr. Debby Bud 03-20-11 at [facility].  Patient aware as they called & scheduled themselves.

## 2011-01-30 ENCOUNTER — Ambulatory Visit: Payer: Medicare Other | Attending: Internal Medicine | Admitting: Audiology

## 2011-01-30 DIAGNOSIS — H903 Sensorineural hearing loss, bilateral: Secondary | ICD-10-CM | POA: Insufficient documentation

## 2011-02-02 ENCOUNTER — Other Ambulatory Visit: Payer: Self-pay | Admitting: Internal Medicine

## 2011-02-02 NOTE — Telephone Encounter (Signed)
Dr.Paz please advise  

## 2011-02-02 NOTE — Telephone Encounter (Signed)
Patient will not see Dr. Debby Bud to establish care until November he needs a refill on his hydrocodone.

## 2011-02-05 MED ORDER — HYDROCODONE-ACETAMINOPHEN 5-500 MG PO TABS: 1.0000 | ORAL_TABLET | Freq: Four times a day (QID) | ORAL | Status: DC | PRN

## 2011-02-05 NOTE — Telephone Encounter (Signed)
Ok 60, 1 RF 

## 2011-02-05 NOTE — Telephone Encounter (Signed)
Done

## 2011-02-13 ENCOUNTER — Other Ambulatory Visit: Payer: Self-pay | Admitting: Internal Medicine

## 2011-02-13 NOTE — Telephone Encounter (Signed)
Alprazolam request [last refill 12/23/10 #30x1]

## 2011-02-14 ENCOUNTER — Other Ambulatory Visit: Payer: Self-pay | Admitting: Internal Medicine

## 2011-02-14 NOTE — Telephone Encounter (Signed)
Zoloft request [last refill 12/24/10 #30x1]

## 2011-02-16 NOTE — Telephone Encounter (Signed)
Okay 40 tablets, no refills

## 2011-02-19 ENCOUNTER — Other Ambulatory Visit: Payer: Self-pay | Admitting: Internal Medicine

## 2011-02-19 MED ORDER — ALPRAZOLAM 0.5 MG PO TABS: ORAL_TABLET | ORAL | Status: DC

## 2011-02-19 NOTE — Telephone Encounter (Signed)
Done

## 2011-02-19 NOTE — Telephone Encounter (Signed)
Change sig. to 1 po qd  Ok #30, 2 RF

## 2011-02-26 ENCOUNTER — Other Ambulatory Visit: Payer: Self-pay | Admitting: Internal Medicine

## 2011-02-26 NOTE — Telephone Encounter (Signed)
Rx sent on 02/19/11 with 1 refill

## 2011-03-07 ENCOUNTER — Encounter (HOSPITAL_COMMUNITY): Payer: Medicare Other | Admitting: Licensed Clinical Social Worker

## 2011-03-16 ENCOUNTER — Other Ambulatory Visit: Payer: Self-pay | Admitting: Internal Medicine

## 2011-03-20 ENCOUNTER — Ambulatory Visit (INDEPENDENT_AMBULATORY_CARE_PROVIDER_SITE_OTHER): Payer: Medicare Other | Admitting: Internal Medicine

## 2011-03-20 ENCOUNTER — Encounter: Payer: Self-pay | Admitting: Internal Medicine

## 2011-03-20 DIAGNOSIS — F068 Other specified mental disorders due to known physiological condition: Secondary | ICD-10-CM

## 2011-03-20 DIAGNOSIS — E119 Type 2 diabetes mellitus without complications: Secondary | ICD-10-CM

## 2011-03-20 DIAGNOSIS — I10 Essential (primary) hypertension: Secondary | ICD-10-CM

## 2011-03-20 DIAGNOSIS — E785 Hyperlipidemia, unspecified: Secondary | ICD-10-CM

## 2011-03-20 NOTE — Progress Notes (Signed)
  Subjective:    Patient ID: Russell [name], male    DOB: 09/10/1923, 77 y.o.   MRN: [id number]  HPI Russell [name] presents to establish for on-going continuity care in transfer from Dr. Drue Novel. Russell [name] states this is due to location - [place] more convenient.  He is feeling pretty good today with no specific complaints.   Past Medical History  Diagnosis Date  . Hyperlipidemia   . Hypertension   . Dementia   . Anxiety associated with depression   . Urolithiasis   . History of cystoscopy     uretheral stricutre, 2005- Dr.Humphreys  . FTT (failure to thrive)     admited 08/2008  . Osteoarthritis     hands  . Colon cancer     s/p resection 12/06  . Prostate cancer     s/p XRT  . Diabetes mellitus     diet mgt (11`/12)   Past Surgical History  Procedure Date  . Hemicolectomy 12/06  . Transurethral resection of prostate   . Xrt     ERT for prostate ca   Family History  Problem Relation Age of Onset  . Diabetes Daughter    History   Social History  . Marital Status: Widowed    Spouse Name: N/A    Number of Children: N/A  . Years of Education: N/A   Occupational History  . Not on file.   Social History Main Topics  . Smoking status: Former Smoker    Quit date: 03/20/1967  . Smokeless tobacco: Never Used  . Alcohol Use: No     quit '68  . Drug Use: No  . Sexually Active: Not Currently   Other Topics Concern  . Not on file   Social History Narrative   7th grade education. Married '51 - 9/09. 3 daughters- '51, '53, '66; 1 son- '54. Lives w/ daughter Thelma Barge who has schizophrenia since age 36. 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 Negative for fever and chills.  HENT: Positive for hearing loss and tinnitus. Negative for rhinorrhea and sinus pressure.   Eyes: Negative for discharge, redness and itching.  Respiratory: Negative.   Cardiovascular: Positive for palpitations. Negative for chest pain and leg  swelling.  Gastrointestinal: Negative.   Genitourinary: Negative for urgency, frequency, flank pain and difficulty urinating.  Musculoskeletal: Negative.   Skin: Negative.   Neurological: Negative for dizziness, tremors and headaches.  Hematological: Negative.   Psychiatric/Behavioral: Negative for hallucinations, confusion, sleep disturbance and agitation.       Objective:   Physical Exam Vitals reveiwed - good BP control Gen'l - older AA man in no distress HEENT- C&S clear, PERRLA, EACS/TMs normal, oropharynx with many missing teeth, no lesions on buccal membrane or palate Neck - supple, no thyromegaly Nodes - no enlargement cervical or supraclavicular regions Chest - no deformity Lungs - Clear Cor - RRR, no murmurs Abdomen - soft Ext - no deformity at major joints, no synovial thickening small joints hands, stiff in movement Neuro - A&O x 3, CN II-XII grossly normal, speech clear, memory not tested but daughter helps coach recall. Decreased sensation at distal right foot to deep vibratory sensation. No light touch and pin-prick sensation.       Assessment & Plan:

## 2011-03-21 NOTE — Assessment & Plan Note (Addendum)
Patient is not taking any medication at this time. Last A1C in late September '12  = 6.1%  Plan - continue with life-style management alone.           Follow-up lab in April '13

## 2011-03-21 NOTE — Assessment & Plan Note (Signed)
Memory problems noted. He seems highly functional - able to manage his ADLs. His family seems supportive and he will continue to live with his daughter

## 2011-03-21 NOTE — Assessment & Plan Note (Signed)
Last lab in Sept '12 reveals excellent control with LDL better than goal of 100 or less  Plan - continue present meds

## 2011-03-21 NOTE — Assessment & Plan Note (Signed)
BP Readings from Last 3 Encounters:  03/20/11 136/72  01/22/11 108/52  11/23/10 126/76   Good control on his present medications. He will continue the same

## 2011-04-19 ENCOUNTER — Other Ambulatory Visit: Payer: Self-pay | Admitting: Internal Medicine

## 2011-04-23 ENCOUNTER — Other Ambulatory Visit: Payer: Self-pay | Admitting: Internal Medicine

## 2011-04-23 MED ORDER — ALPRAZOLAM 0.5 MG PO TABS: ORAL_TABLET | ORAL | Status: DC

## 2011-04-23 NOTE — Telephone Encounter (Signed)
Per Dr hopper ok x1, rx sent

## 2011-04-23 NOTE — Telephone Encounter (Signed)
Last Filled 04-21-11 #30 1, last ov 01-22-11

## 2011-04-30 ENCOUNTER — Telehealth: Payer: Self-pay | Admitting: Internal Medicine

## 2011-04-30 NOTE — Telephone Encounter (Signed)
Error

## 2011-05-03 ENCOUNTER — Telehealth: Payer: Self-pay

## 2011-05-03 NOTE — Telephone Encounter (Signed)
Pharmacy sent fax requesting refill on Alprazolam .5 mg.  Called [facility] their records show last refill 02/18/11. The patients medication list 04/23/11 #30 with 0 refills, Please advise on refill.

## 2011-05-04 MED ORDER — ALPRAZOLAM 0.5 MG PO TABS: ORAL_TABLET | ORAL | Status: DC

## 2011-05-04 NOTE — Telephone Encounter (Signed)
Called the patient, he stated he is out of his Alprazolam.  Informed of MD's instructions and the patient did schedule appointment with Dr. Debby Bud on 05/07/11. Called in refill for the patient as instructed by MD to [facility].

## 2011-05-04 NOTE — Telephone Encounter (Signed)
New patinet as of Nov 20 - transfer from [facility]. Check with patient - if he is out of medication ok to refill for # 30 x 1 and will need f/u ov to discuss on-going treatment for anxiety

## 2011-05-07 ENCOUNTER — Ambulatory Visit (INDEPENDENT_AMBULATORY_CARE_PROVIDER_SITE_OTHER): Payer: Medicare Other | Admitting: Internal Medicine

## 2011-05-07 DIAGNOSIS — E785 Hyperlipidemia, unspecified: Secondary | ICD-10-CM

## 2011-05-07 DIAGNOSIS — E119 Type 2 diabetes mellitus without complications: Secondary | ICD-10-CM

## 2011-05-07 DIAGNOSIS — I10 Essential (primary) hypertension: Secondary | ICD-10-CM

## 2011-05-07 DIAGNOSIS — R945 Abnormal results of liver function studies: Secondary | ICD-10-CM

## 2011-05-07 DIAGNOSIS — F068 Other specified mental disorders due to known physiological condition: Secondary | ICD-10-CM

## 2011-05-07 NOTE — Progress Notes (Signed)
  Subjective:    Patient ID: Russell [name], male    DOB: [birth year], 78 y.o.   MRN: [id number]  HPI Mr. Minar was seen as a new patient ins transfer from [facility] on Nov 20th. In the interval he has gotten a hearing aid for right ear. He reports that he is hearing better. He has been feeling OK. His daughter does worry him some.   I have reviewed the patient's medical history in detail and updated the computerized patient record.    Review of Systems Constitutional:  Negative for fever, chills, activity change and unexpected weight change.  HEENT:  Negative for hearing loss, ear pain, congestion, neck stiffness and postnasal drip. Negative for sore throat or swallowing problems. Negative for dental complaints.   Eyes: Negative for vision loss or change in visual acuity.  Respiratory: Negative for chest tightness and wheezing. Negative for DOE.   Cardiovascular: Negative for chest pain or palpitations. No decreased exercise tolerance Gastrointestinal: No change in bowel habit. No bloating or gas. No reflux or indigestion Genitourinary: Negative for urgency, frequency, flank pain and difficulty urinating.  Musculoskeletal: Negative for myalgias, back pain, arthralgias and gait problem.  Neurological: Negative for dizziness, tremors, weakness and headaches.  Hematological: Negative for adenopathy.  Psychiatric/Behavioral: Negative for behavioral problems and dysphoric mood.       Objective:   Physical Exam Filed Vitals:   05/07/11 1416  BP: 136/72  Pulse: 56  Temp: 96.8 F (36 C)  gen'l- Elderly AA man in no distress HEENT- hearing aid in right ear. Conjunctiva clear Pulm - normal respirations, lungs clear Cor - RRR Abd -soft.        Assessment & Plan:

## 2011-05-07 NOTE — Assessment & Plan Note (Signed)
No reports of hypoglycemia  For lab in April. OV to follow

## 2011-05-07 NOTE — Assessment & Plan Note (Signed)
Here by himself today - does OK.

## 2011-05-07 NOTE — Patient Instructions (Signed)
Thanks for coming back to see me. Your exam and vital signs today are good.   You will need to return for lab work April 8th - see list above.  Continue all your medications as listed.   See you in April

## 2011-05-07 NOTE — Assessment & Plan Note (Signed)
BP Readings from Last 3 Encounters:  05/07/11 136/72  03/20/11 136/72  01/22/11 108/52

## 2011-05-11 ENCOUNTER — Ambulatory Visit (HOSPITAL_COMMUNITY): Payer: Medicare Other | Admitting: Licensed Clinical Social Worker

## 2011-05-24 ENCOUNTER — Telehealth: Payer: Self-pay

## 2011-05-24 MED ORDER — PROMETHAZINE-CODEINE 6.25-10 MG/5ML PO SYRP: 5.0000 mL | ORAL_SOLUTION | Freq: Four times a day (QID) | ORAL | Status: AC | PRN

## 2011-05-24 NOTE — Telephone Encounter (Signed)
Pt's daughter advised.  

## 2011-05-24 NOTE — Telephone Encounter (Signed)
Ok for promethazine/codein 1 tsp q 6 as needed 120 ml

## 2011-05-24 NOTE — Telephone Encounter (Signed)
Daughter called c/o of dry cough pt has x 1.5 weeks that is occasionally "wet" and a runny nose. Daughter denies fever or SOB and says that he has tried OTC Robitussin, Delsym with no relief. Daughter is requesting Rx from MD

## 2011-05-28 ENCOUNTER — Ambulatory Visit (INDEPENDENT_AMBULATORY_CARE_PROVIDER_SITE_OTHER): Payer: Medicare Other | Admitting: Internal Medicine

## 2011-05-28 ENCOUNTER — Other Ambulatory Visit: Payer: Self-pay | Admitting: Internal Medicine

## 2011-05-28 ENCOUNTER — Encounter: Payer: Self-pay | Admitting: Internal Medicine

## 2011-05-28 DIAGNOSIS — E119 Type 2 diabetes mellitus without complications: Secondary | ICD-10-CM

## 2011-05-28 DIAGNOSIS — E785 Hyperlipidemia, unspecified: Secondary | ICD-10-CM

## 2011-05-28 DIAGNOSIS — F411 Generalized anxiety disorder: Secondary | ICD-10-CM

## 2011-05-28 DIAGNOSIS — I10 Essential (primary) hypertension: Secondary | ICD-10-CM

## 2011-05-28 NOTE — Assessment & Plan Note (Signed)
Will encourage to take sertraline once a day and reserve alprazolam for as needed [place]

## 2011-05-28 NOTE — Progress Notes (Signed)
  Subjective:    Patient ID: Russell [name], male    DOB: 03/20/1924, 78 y.o.   MRN: [id number]  HPI Received a call from patient's daughter last week Jan 24th for cough w/o fever, chills or SOB. He was started on prom/cod for cough. He is feeling better..  Patient's son-in-law is present and reports that patient has not been taking zoloft every day and he has not take losartan since Dec 20th, yet his blood pressure is well controlled. Reviewed chart and his BP Nov 20th and Jan 7th was good.   PMH, FamHx and SocHx reviewed for any changes and relevance.     Review of Systems System review is negative for any constitutional, cardiac, pulmonary, GI or neuro symptoms or complaints other than as described in the HPI.     Objective:   Physical Exam Filed Vitals:   05/28/11 1146  BP: 122/62  Pulse: 89  Temp: 97.8 F (36.6 C)  Resp: 14  Gen'l - Elderly AA man in no distress Chest - good breath sounds, no rales or wheezes Cor - RRR Neuro - Awake and alert, non-focal exam.        Assessment & Plan:  Cough - no sign of bacterial infection, thus no need for antibiotics Plan - continue cough syrup as needed.   Reviewed chart - Dr. Drue Novel authorized diabetic shoes Feb '11 - PVD with callus listed.   Referred to [facility] - call made 05/29/11

## 2011-05-28 NOTE — Assessment & Plan Note (Signed)
BP Readings from Last 3 Encounters:  05/28/11 122/62  05/07/11 136/72  03/20/11 136/72   He has been well controlled but per family he has not been taking losartan.  Plan - stop losartan, continue HCTZ.

## 2011-05-28 NOTE — Assessment & Plan Note (Signed)
Lab Results  Component Value Date   CHOL 151 01/22/2011   HDL 53.10 01/22/2011   LDLCALC 89 01/22/2011   TRIG 46.0 01/22/2011   CHOLHDL 3 01/22/2011

## 2011-05-28 NOTE — Patient Instructions (Signed)
Cough and cold - no evidence of a bacterial infection or need for antibiotics. Plan - may continue cough syrup  AS NEEDED.  Blood pressure - very good control at 122/62 and this is without losartan! Will stop/remove the losartan from medication list. Will need to watch the BP to be sure it stays in controlled range.  Anxiety - you should take the sertraline every day to control the anxiety and use the alprazolam only as needed.   Re: diabetic shoes: the law requires that you have had an amputation of part of your foot or toes, that there is a history of non-healing ulcer, history of callus with ulceration, deformity of the foot ( not nail problems) or documentation of very poor circulation. You should be going to the foot center for nail care and the podiatrist can exam you feet or you can return for a diabetic foot exam.

## 2011-05-28 NOTE — Telephone Encounter (Signed)
Refill request Vicodin 5-500mg . #60 with 1 refill. Last refilled on 10.5.12. OK to refill?

## 2011-05-28 NOTE — Assessment & Plan Note (Signed)
Lab Results  Component Value Date   HGBA1C 6.1 01/22/2011

## 2011-05-29 ENCOUNTER — Ambulatory Visit (HOSPITAL_COMMUNITY): Payer: Medicare Other | Admitting: Licensed Clinical Social Worker

## 2011-05-29 NOTE — Telephone Encounter (Signed)
Will send to PCP 

## 2011-05-30 ENCOUNTER — Ambulatory Visit (HOSPITAL_COMMUNITY): Payer: Medicare Other | Admitting: Licensed Clinical Social Worker

## 2011-06-13 ENCOUNTER — Encounter (HOSPITAL_COMMUNITY): Payer: Self-pay | Admitting: Licensed Clinical Social Worker

## 2011-06-13 ENCOUNTER — Ambulatory Visit (INDEPENDENT_AMBULATORY_CARE_PROVIDER_SITE_OTHER): Payer: Medicare Other | Admitting: Licensed Clinical Social Worker

## 2011-06-13 DIAGNOSIS — F332 Major depressive disorder, recurrent severe without psychotic features: Secondary | ICD-10-CM

## 2011-06-13 NOTE — Progress Notes (Signed)
   THERAPIST PROGRESS NOTE  Session Time: 2:00pm-2:50pm  Participation Level: Active  Behavioral Response: Fairly GroomedAlertAnxious and Depressed  Type of Therapy: Family Therapy  Treatment Goals addressed: Anxiety, Coping and Diagnosis: depression  Interventions: Solution Focused, Strength-based and Family Systems  Summary: Russell [name] is a 78 y.o. male who presents with depressed mood and flat affect. Both his daughters join him today and express concern that patient has become increasingly depressed, will not communicate with them about what is upsetting him, is eating poorly and cries often. Patient confirms all this to be true and refuses to process his depression and anxiety when his daughters are present. His daughters are concerned that he spends too much time alone, taking care of their disabled sister, without peer interaction. Two close friends have recently died. Daughters both deny that he is confused, disoriented or having trouble with memory and patient confirms this. Met with patient alone, who becomes tearful when trying to explain his sadness. He has difficulty communicating his sadness, but states his disappointment in his daughters because they are not taking of him as they should. He feels their stress and is bothered by their arguments. He wants to be able to talk to them, but is especially upset with Steward Drone because she talks about herself. He needs help cleaning, cooking, doing laundry and managing his home. He is fearful that his family will put him in a nursing home and over hears them talking about this. He is tearful when wondering who will care for his disabled daughter after his death. He states that he is not satisfied with the answers his family provides, that they will take of her. Brought daughters back in session and communicated patients concerns, with his permission.    Suicidal/Homicidal: Nowithout intent/plan  Therapist Response: Processed w/pt his feelings  of sadness, disappointment, fear and grief. Explored his grief reaction and normalized his pain. Explored his loneliness and the loss of friends whom he talked to about his problems and who checked up on him. Discussed a plan with patient and daughters about getting daily help for patient. Provided them a list of home health agencies to contact. Provided information for [facility]. Recommend patient see Dr. Lolly Mustache as soon as possible for a medication adjustment. Both daughters agree to this plan and are going to follow up with these recommendations. Patient demonstrates some relief after a plan was discussed to help him.   Plan: Return again in two weeks.  Diagnosis: Axis I: Major Depression, Recurrent severe    Axis II: No diagnosis    Alaina Donati, LCSW 06/13/2011

## 2011-06-18 ENCOUNTER — Other Ambulatory Visit: Payer: Self-pay | Admitting: Internal Medicine

## 2011-06-18 NOTE — Telephone Encounter (Signed)
Refill request for hydrochlorithazide 25mg . Did you prescribe this or Dr. Debby Bud?

## 2011-06-19 ENCOUNTER — Other Ambulatory Visit: Payer: Self-pay | Admitting: Internal Medicine

## 2011-06-20 ENCOUNTER — Other Ambulatory Visit: Payer: Self-pay | Admitting: *Deleted

## 2011-06-20 MED ORDER — HYDROCHLOROTHIAZIDE 25 MG PO TABS: ORAL_TABLET | ORAL | Status: DC

## 2011-06-20 NOTE — Telephone Encounter (Signed)
[facility] pharmacist is concerned about patient's well-being

## 2011-06-20 NOTE — Telephone Encounter (Signed)
OK 

## 2011-06-20 NOTE — Telephone Encounter (Signed)
To Dr Debby Bud

## 2011-06-22 NOTE — Telephone Encounter (Signed)
Dr Norins 

## 2011-06-22 NOTE — Telephone Encounter (Signed)
Called in refill Rx to patients pharmacy with 11 refills

## 2011-06-22 NOTE — Telephone Encounter (Signed)
OK to refill

## 2011-06-26 ENCOUNTER — Other Ambulatory Visit: Payer: Self-pay | Admitting: *Deleted

## 2011-06-26 MED ORDER — LOSARTAN POTASSIUM 100 MG PO TABS: 100.0000 mg | ORAL_TABLET | Freq: Every day | ORAL | Status: DC

## 2011-06-26 NOTE — Telephone Encounter (Signed)
Cozaar 100 refill requested. Done.

## 2011-06-27 ENCOUNTER — Ambulatory Visit (INDEPENDENT_AMBULATORY_CARE_PROVIDER_SITE_OTHER): Payer: Medicare Other | Admitting: Licensed Clinical Social Worker

## 2011-06-27 DIAGNOSIS — F0390 Unspecified dementia without behavioral disturbance: Secondary | ICD-10-CM

## 2011-06-27 DIAGNOSIS — F332 Major depressive disorder, recurrent severe without psychotic features: Secondary | ICD-10-CM

## 2011-06-27 NOTE — Progress Notes (Signed)
   THERAPIST PROGRESS NOTE  Session Time: 1:00pm-1:50pm  Participation Level: Active  Behavioral Response: Fairly GroomedAlertAnxious and Depressed  Type of Therapy: Family Therapy  Treatment Goals addressed: Anxiety, Coping and Diagnosis: depression  Interventions: Solution Focused, Supportive and Family Systems  Summary: Russell [name] is a 78 y.o. male who presents with euthymic mood and affect. He reports improvement in his degree of depression and anxiety since his previous session. He reports that he forgot to take his medication on the day of his last appointment and his daughter endorses that he forgets to take all his medication, even when it is a pill box. Discussed the importance of reminders for patient and daily monitoring. Patients daughters are looking into home health for patient. Patient is lonely since his close friends have died and he does not have anyone to talk to. He is frustrated by his daughters and cries in session when his daughter, Steward Drone, begins to escalate and agitate him. His sleep and appetite are wnl.   Suicidal/Homicidal: Nowithout intent/plan  Therapist Response: Processed w/pt his feelings of loss and frustration with his children. Normalized his grief and loss related to his loss of independent functioning. Educated his daughter about his loss, encouraging her to avoid conflict with her father. Reviewed patients self care plan. Assessed his progress related to self care. Patient's self care is fair. Recommend proper diet, regular exercise, socialization and recreation. Patients daughter has poor insight into how her behavior increases her fathers anxiety and depression. Will continue to work on family systems to decrease patients stressors.   Plan: Return again in two to three weeks.  Diagnosis: Axis I: Depressive Disorder NOS and dementia    Axis II: No diagnosis    Drayke Grabel, LCSW 06/27/2011

## 2011-07-02 ENCOUNTER — Other Ambulatory Visit: Payer: Self-pay | Admitting: *Deleted

## 2011-07-02 ENCOUNTER — Encounter (HOSPITAL_COMMUNITY): Payer: Self-pay | Admitting: Psychiatry

## 2011-07-02 ENCOUNTER — Ambulatory Visit (INDEPENDENT_AMBULATORY_CARE_PROVIDER_SITE_OTHER): Payer: Medicare Other | Admitting: Psychiatry

## 2011-07-02 VITALS — BP 129/65 | HR 68 | Wt 156.4 lb

## 2011-07-02 DIAGNOSIS — F329 Major depressive disorder, single episode, unspecified: Secondary | ICD-10-CM

## 2011-07-02 MED ORDER — SERTRALINE HCL 100 MG PO TABS: 100.0000 mg | ORAL_TABLET | Freq: Every day | ORAL | Status: DC

## 2011-07-02 NOTE — Telephone Encounter (Signed)
Alprazolam request [01.04.13 330x1]

## 2011-07-02 NOTE — Telephone Encounter (Signed)
Pt's daughter is req Rf for Alprazolam 0.5 mg. Please advise.

## 2011-07-02 NOTE — Telephone Encounter (Signed)
Used 30 tablets since January. Ok for refill x 2

## 2011-07-02 NOTE — Progress Notes (Signed)
Chief complaint Medication management and medication adjustment  History of presenting illness Patient is 78 year old African American widowed man who came for his appointment with his daughter. Patient was last seen in July 2012. Since then patient has been getting medication from his primary care physician however recently daughter endorse that patient is been more irritable angry and agitated. He has been seeing therapist in this office will also endorse patient has been slowly decompensating. Patient is a poor historian most of the information was obtained from her daughter. In past 6 months he has loss to what his best friend. Patient admitted for isolated withdrawn and minimal involved in his life. As per daughter patient is also poorly compliant with his medication, get upset when he was asked to take medication. Though he denies any agitation anger or mood swings but remains quite and withdrawn. Patient denies any crying spells, hallucinations or paranoid thinking. He is taking Zoloft which was prescribed by his primary care physician however he admitted to stop taking it last year and recently restarted again. He is also taking Xanax 0.5 mg as needed for anxiety.  Past psychiatric history Patient denies any history of psychiatric inpatient treatment or any history of suicidal attempt. He denies any history of violence of psychosis. He became very depressed when his wife died 3 years ago. He was admitted at [facility] due to failure to thrive. He was seen in consultation liaison services and recommended antidepressant.  Medical history Patient has history of hypertension, hyperlipidemia, failure to thrive in the past, hearing impairment and dementia. His primary care physician is Dr. Claudette Stapler, however he is recently seeing Dr.Norin who is close to his place.  Alcohol and substance use history Patient has a history of alcohol or substance use  Psychosocial history Patient lives with his  daughter who has psychiatric illness. Her other daughter lives close but very involved in his treatment.  Family history Patient's sister has mental illness.  Mental status examination Patient is a poor historian, he is fairly groomed and dressed. He has fair dental hygiene. His speech is soft but nonspontaneous and unproductive. His thought process is slow but logical. He has poor attention and concentration. He denies any active or passive suicidal thinking and homicidal thinking. He is pleasant on approach and cooperative with answer. There were no paranoia or delusion of psychosis present at this time. He denies any auditory or visual hallucination. He is alert and oriented x3 however he has difficulty recalling past events. His insight judgment is fair and his impulse control is okay.  Diagnoses Axis I depressive disorder NOS, mood disorder due to general medical condition Axis II deferred Axis III see medical history Axis IV mild to moderate Axis V 55-65  Plan I have reviewed the medication, history and collateral notes. I do believe patient has been decompensating which could be due to poor compliant with the medication or recently more stressors and losses in his life. His medication is fixed by his daughter however patient admitted being poorly compliant that sometimes. I encourage him to take his medication on a regular basis. I have explained risks and benefits of medication. I also talk about risk of relapse if he does not take his medicine on time. I will see him again in 3 weeks and patient do not show any improvement we will consider increasing his Zoloft. He will continue to see therapist regularly. I will see him again in 3-4 weeks. Time spent 30 minutes

## 2011-07-03 MED ORDER — ALPRAZOLAM 0.5 MG PO TABS: ORAL_TABLET | ORAL | Status: DC

## 2011-07-03 NOTE — Telephone Encounter (Signed)
Done

## 2011-07-12 ENCOUNTER — Other Ambulatory Visit: Payer: Self-pay | Admitting: Internal Medicine

## 2011-07-12 NOTE — Telephone Encounter (Signed)
Refill done.  

## 2011-07-12 NOTE — Telephone Encounter (Signed)
Did you prescribe Zoloft or does this need to be sent to Dr. Debby Bud?

## 2011-07-12 NOTE — Telephone Encounter (Signed)
Reviewed last psych note from March 4th - patient was to continue on zoloft 50 mg daily.  OK to refill Rx, #30 refill x 11

## 2011-07-18 ENCOUNTER — Ambulatory Visit (HOSPITAL_COMMUNITY): Payer: Medicare Other | Admitting: Licensed Clinical Social Worker

## 2011-07-25 ENCOUNTER — Ambulatory Visit (HOSPITAL_COMMUNITY): Payer: Medicare Other | Admitting: Licensed Clinical Social Worker

## 2011-07-31 ENCOUNTER — Other Ambulatory Visit: Payer: Self-pay | Admitting: Internal Medicine

## 2011-08-03 ENCOUNTER — Ambulatory Visit (HOSPITAL_COMMUNITY): Payer: Self-pay | Admitting: Psychiatry

## 2011-08-03 ENCOUNTER — Telehealth: Payer: Self-pay

## 2011-08-03 NOTE — Telephone Encounter (Signed)
Rx refill request received for Vicodin 5-500 mg, last filled 06/27/2011

## 2011-08-05 NOTE — Telephone Encounter (Signed)
Ok to refill for 30 day supply.   Indication for narcotic not apparent - will need OV to address chronic pain

## 2011-08-06 ENCOUNTER — Other Ambulatory Visit (INDEPENDENT_AMBULATORY_CARE_PROVIDER_SITE_OTHER): Payer: Medicare Other

## 2011-08-06 DIAGNOSIS — R945 Abnormal results of liver function studies: Secondary | ICD-10-CM

## 2011-08-06 DIAGNOSIS — E119 Type 2 diabetes mellitus without complications: Secondary | ICD-10-CM

## 2011-08-06 DIAGNOSIS — I10 Essential (primary) hypertension: Secondary | ICD-10-CM

## 2011-08-06 DIAGNOSIS — E785 Hyperlipidemia, unspecified: Secondary | ICD-10-CM

## 2011-08-06 LAB — COMPREHENSIVE METABOLIC PANEL
ALT: 21 U/L (ref 0–53)
AST: 28 U/L (ref 0–37)
Alkaline Phosphatase: 100 U/L (ref 39–117)
CO2: 27 mEq/L (ref 19–32)
Sodium: 145 mEq/L (ref 135–145)
Total Bilirubin: 0.5 mg/dL (ref 0.3–1.2)
Total Protein: 6.3 g/dL (ref 6.0–8.3)

## 2011-08-06 LAB — LIPID PANEL
Cholesterol: 126 mg/dL (ref 0–200)
LDL Cholesterol: 68 mg/dL (ref 0–99)
Total CHOL/HDL Ratio: 3
Triglycerides: 48 mg/dL (ref 0.0–149.0)

## 2011-08-06 LAB — HEPATIC FUNCTION PANEL
AST: 28 U/L (ref 0–37)
Albumin: 3.6 g/dL (ref 3.5–5.2)
Alkaline Phosphatase: 100 U/L (ref 39–117)
Bilirubin, Direct: 0.2 mg/dL (ref 0.0–0.3)

## 2011-08-06 MED ORDER — HYDROCODONE-ACETAMINOPHEN 5-500 MG PO TABS: ORAL_TABLET | ORAL | Status: DC

## 2011-08-06 NOTE — Telephone Encounter (Signed)
Done. Pt informed; informed daughter at patient's request about OV scheduled 04.24.13 @3 :00pm

## 2011-08-08 ENCOUNTER — Ambulatory Visit (INDEPENDENT_AMBULATORY_CARE_PROVIDER_SITE_OTHER): Payer: Medicare Other | Admitting: Licensed Clinical Social Worker

## 2011-08-08 ENCOUNTER — Encounter (HOSPITAL_COMMUNITY): Payer: Self-pay | Admitting: Licensed Clinical Social Worker

## 2011-08-08 DIAGNOSIS — F332 Major depressive disorder, recurrent severe without psychotic features: Secondary | ICD-10-CM | POA: Insufficient documentation

## 2011-08-08 NOTE — Progress Notes (Signed)
   THERAPIST PROGRESS NOTE  Session Time: 9:30am-10:20am  Participation Level: Active  Behavioral Response: Well GroomedAlertEuthymic  Type of Therapy: Family Therapy  Treatment Goals addressed: Communication: with daughters, Coping and Diagnosis: depression  Interventions: Strength-based, Supportive and Family Systems  Summary: Russell [name] is a 78 y.o. male who presents with euthymic mood and bright affect. His daughters accompany him today. Met with patient first, who reports that he is doing well, with improvement in his depression, with decreased crying spells, decreased anxiety, improved sleep and improved appetite. He appears to have gained weight since his last session and his daughters report that he is eating well. His frustration about not receiving enough help from his daughters is decreasing. He is enjoying the nice weather and is outside mowing his lawn. Overall, he is happy with his quality of life. His daughters have arranged for home health care and a nurse has been out to visit him. His blood pressure and blood sugar levels are good. He does need assistance with meal preparation and his daughters have inquired about meals on wheels.   Suicidal/Homicidal: Nowithout intent/plan  Therapist Response: Reviewed patients progress and assessed current functioning. He demonstrates significant improvement in his depression and anxiety. Encouraged his daughter to take a more active role in communicating with the home health nurse. Reviewed patients self care plan. Assessed his progress related to self care. Patient's self care is good. Recommend proper diet, regular exercise, socialization and recreation.   Plan: Return again in six weeks.  Diagnosis: Axis I: Major Depression, Recurrent severe    Axis II: No diagnosis    Jameika Kinn, LCSW 08/08/2011:30am-

## 2011-08-10 ENCOUNTER — Encounter: Payer: Self-pay | Admitting: Internal Medicine

## 2011-08-13 ENCOUNTER — Ambulatory Visit (INDEPENDENT_AMBULATORY_CARE_PROVIDER_SITE_OTHER): Payer: Medicare Other | Admitting: Psychiatry

## 2011-08-13 ENCOUNTER — Encounter (HOSPITAL_COMMUNITY): Payer: Self-pay | Admitting: Psychiatry

## 2011-08-13 VITALS — BP 152/79 | HR 61 | Wt 174.2 lb

## 2011-08-13 DIAGNOSIS — F329 Major depressive disorder, single episode, unspecified: Secondary | ICD-10-CM

## 2011-08-13 MED ORDER — SERTRALINE HCL 100 MG PO TABS: 100.0000 mg | ORAL_TABLET | Freq: Every day | ORAL | Status: DC

## 2011-08-13 NOTE — Progress Notes (Signed)
Chief complaint Medication management and medication adjustment  History of presenting illness Patient is 78 year old African American widowed man who came for his appointment with his daughter.  Her daughter endorse that patient is doing much better and his behavior.  He is more calm and pleasant.  He is taking his medication regularly.  His other daughter usually fix the medication for him .  He is sleeping better.  Denies any agitation anger or mood swings.  Denies any side effects of medication.  He is more social and able to do things .  However he continues to have memory issues and does not remember things very well .  As per daughter patient denies any aggression or any paranoid thinking.  He is tolerating 100 mg sertraline without any problem.  He is also seeing therapist regularly.  Current psychiatric medication Sertraline 100 mg daily  Xanax 0.5 mg as needed by his primary care physician.    Past psychiatric history Patient denies any history of psychiatric inpatient treatment or any history of suicidal attempt. He denies any history of violence of psychosis. He became very depressed when his wife died 3 years ago. He was admitted at [facility] due to failure to thrive. He was seen in consultation liaison services and recommended antidepressant.  Medical history Patient has history of hypertension, hyperlipidemia, failure to thrive in the past, hearing impairment and dementia. His primary care physician is Dr. Claudette Stapler, however he is recently seeing Dr.Norin who is close to his place.  Alcohol and substance use history Patient has a history of alcohol or substance use  Psychosocial history Patient lives with his daughter who has psychiatric illness. Her other daughter lives close but very involved in his treatment.  Family history Patient's sister has mental illness.  Mental status examination Patient is a poor historian, he is fairly groomed and dressed. He has fair  dental hygiene. His speech is soft but nonspontaneous and unproductive.  He is calm and cooperative.  His thought process is slow but logical. He has poor attention and concentration. He denies any active or passive suicidal thinking and homicidal thinking. There were no paranoia or delusion of psychosis present at this time. He denies any auditory or visual hallucination. He is alert and oriented x3 however he has difficulty recalling past events. His insight judgment is fair and his impulse control is okay.  Diagnoses Axis I depressive disorder NOS, mood disorder due to general medical condition Axis II deferred Axis III see medical history Axis IV mild to moderate Axis V 55-65  Plan I have reviewed the medication, history and collateral notes.  Patient is doing much better since he is compliant with the medication.  I encourage him to take sertraline 100 mg daily.  I also provided list of medication to help his daughter as some of the medication patient and daughter are not aware if he is taking.  Daughter will clarify with her other sister and bring the correct medication list on his next appointment.  I also explained risks and benefits of medication and risk of relapse but poorly compliant with medication.  I will continue sertraline 100 mg daily.  I recommended to call us if he is any question or concern about the medication.  Otherwise I will see him again in 6 weeks.  Time spent 30 minutes.

## 2011-08-20 ENCOUNTER — Ambulatory Visit: Payer: Medicare Other | Admitting: Internal Medicine

## 2011-08-22 ENCOUNTER — Ambulatory Visit: Payer: Self-pay | Admitting: Internal Medicine

## 2011-09-03 ENCOUNTER — Ambulatory Visit (INDEPENDENT_AMBULATORY_CARE_PROVIDER_SITE_OTHER): Payer: Medicare Other | Admitting: Internal Medicine

## 2011-09-03 ENCOUNTER — Encounter: Payer: Self-pay | Admitting: Internal Medicine

## 2011-09-03 VITALS — BP 140/60 | HR 90 | Temp 97.5°F | Resp 16 | Wt 171.0 lb

## 2011-09-03 DIAGNOSIS — I10 Essential (primary) hypertension: Secondary | ICD-10-CM

## 2011-09-03 DIAGNOSIS — F332 Major depressive disorder, recurrent severe without psychotic features: Secondary | ICD-10-CM

## 2011-09-03 DIAGNOSIS — E785 Hyperlipidemia, unspecified: Secondary | ICD-10-CM

## 2011-09-03 DIAGNOSIS — M1990 Unspecified osteoarthritis, unspecified site: Secondary | ICD-10-CM

## 2011-09-03 DIAGNOSIS — E119 Type 2 diabetes mellitus without complications: Secondary | ICD-10-CM

## 2011-09-03 NOTE — Assessment & Plan Note (Signed)
Lab Results  Component Value Date   HGBA1C 5.7 08/06/2011   Excellent control - on no medication at this time.

## 2011-09-03 NOTE — Progress Notes (Signed)
  Subjective:    Patient ID: Russell [name], male    DOB: 15-Jul-1923, 78 y.o.   MRN: [id number]  HPI Russell [name] was last seen in Jan '13. At that time he was taken of losartan but continue on HCTZ. He was encouraged to take his zoloft daily. In the interval he has seen Dr. Lolly Mustache x 2 and Geanie Berlin x 4. He has been taking his medication and has been doing better. His daughter is with his today and she says they are carefull to check his med tray to insure adherence.   He has had lab April '13 - reviewed with him today.  Past Medical History  Diagnosis Date  . Hyperlipidemia   . Hypertension   . Dementia   . Anxiety associated with depression   . Urolithiasis   . History of cystoscopy     uretheral stricutre, 2005- Dr.Humphreys  . FTT (failure to thrive)     admited 08/2008  . Osteoarthritis     hands  . Colon cancer     s/p resection 12/06  . Prostate cancer     s/p XRT  . Diabetes mellitus     diet mgt (11`/12)  . Loss of hearing     being fitted for hearing aids (11/12)   Past Surgical History  Procedure Date  . Hemicolectomy 12/06  . Transurethral resection of prostate   . Xrt     ERT for prostate ca  . Cataract extraction, bilateral    Family History  Problem Relation Age of Onset  . Diabetes Daughter    History   Social History  . Marital Status: Widowed    Spouse Name: N/A    Number of Children: N/A  . Years of Education: N/A   Occupational History  . Not on file.   Social History Main Topics  . Smoking status: Former Smoker    Quit date: 03/20/1967  . Smokeless tobacco: Never Used  . Alcohol Use: No     quit '68  . Drug Use: No  . Sexually Active: Not Currently   Other Topics Concern  . Not on file   Social History Narrative   7th grade education. Married '51 - 9/09. 3 daughters- '51, '53, '66; 1 son- '54. Lives w/ daughter Thelma Barge who has schizophrenia since age 67. 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     Objective:   Physical Exam Filed Vitals:   09/03/11 0933  BP: 140/60  Pulse: 90  Temp: 97.5 F (36.4 C)  Resp: 16   Gen'l- WNWD AA man who looks younger than his stated age. HEENT- poor dentition Cor- RRR Pulm - normal respirations. Neuro/psych - doing well - alert and oriented, in good spirits.       Assessment & Plan:

## 2011-09-03 NOTE — Patient Instructions (Signed)
Blood sugar is great. No need for medication. Watch your diet.  Cholesterol levels are excellent. Liver functions are normal. Plan - continue to take simvastatin  Blood pressure is OK. Continue to take HCTZ.  Arthritis - looks like your are doing OK.   Anxiety and irritability - continue your sertraline (Zoloft) daily. Keep your appointment with Ms Delrae Sawyers and Dr. Lolly Mustache.  Come back to see me in 6 months.

## 2011-09-03 NOTE — Assessment & Plan Note (Signed)
Will take occasional hydrocodone but is very functional.

## 2011-09-03 NOTE — Assessment & Plan Note (Signed)
BP Readings from Last 3 Encounters:  09/03/11 140/60  08/13/11 152/79  07/02/11 129/65   Reasonable control on signal agent - HCTZ.

## 2011-09-03 NOTE — Assessment & Plan Note (Signed)
Patient is taking his medication . By his, and his daughters, report he is doing well. No behavioral problems

## 2011-09-03 NOTE — Assessment & Plan Note (Signed)
Lab Results  Component Value Date   CHOL 126 08/06/2011   HDL 48.30 08/06/2011   LDLCALC 68 08/06/2011   TRIG 48.0 08/06/2011   CHOLHDL 3 08/06/2011   Excellent control on simvasttin 80. He has been on this dose for more than a year and has had no complications or adverse effects. Liver functions are normal.

## 2011-09-10 ENCOUNTER — Telehealth: Payer: Self-pay

## 2011-09-10 ENCOUNTER — Telehealth (HOSPITAL_COMMUNITY): Payer: Self-pay

## 2011-09-10 NOTE — Telephone Encounter (Signed)
Pt's daughter called stating pt has incurred a bill at psychiatry office of $200+. Pt does not have the means to pay both his co pay and make payments on this debt.  Pt has decided to hold off on visit to psychiatry and per the suggestion of Dr Celesta Gentile, pt is requesting Dr Debby Bud consider managing his Xanax and Zoloft Rx until he is able to pay off this debt and resume visits. Please advise.

## 2011-09-10 NOTE — Telephone Encounter (Signed)
Ok . Will refill Rxs when they are due.

## 2011-09-10 NOTE — Telephone Encounter (Signed)
12:54pm 09/10/11 pt's daughter called stating that due to doctor bills it will be hard to keep the appts - the daughter wanted to know if her dad could miss some appts - Steward Drone wanted to know if it would be okay with Dr. Lolly Mustache - advise that I would give Dr. Lolly Mustache the msg.   Returned the call after speaking with Dr. Lolly Mustache it was suggested that pt check with his pcp to see if that office could monitor and/or if possible write the rx - it was also advised to Steward Drone if this was not possible with the pcp, to call us back. - Pt's daughter Steward Drone was aware and understood before the call- ended./sh

## 2011-09-11 NOTE — Telephone Encounter (Signed)
Pt's daughter advised of MD's decision and will call back when refills are needed.

## 2011-09-14 ENCOUNTER — Other Ambulatory Visit: Payer: Self-pay | Admitting: Internal Medicine

## 2011-09-15 ENCOUNTER — Other Ambulatory Visit: Payer: Self-pay | Admitting: Internal Medicine

## 2011-09-17 ENCOUNTER — Other Ambulatory Visit: Payer: Self-pay | Admitting: *Deleted

## 2011-09-17 NOTE — Telephone Encounter (Signed)
Patient request refill on hydocodone vicodin 5-500mg  tablet. plese advise

## 2011-09-18 ENCOUNTER — Telehealth: Payer: Self-pay

## 2011-09-18 MED ORDER — HYDROCODONE-ACETAMINOPHEN 5-500 MG PO TABS: ORAL_TABLET | ORAL | Status: DC

## 2011-09-18 MED ORDER — ALPRAZOLAM 0.5 MG PO TABS: ORAL_TABLET | ORAL | Status: DC

## 2011-09-18 NOTE — Telephone Encounter (Signed)
Faxed hydrocodone script back to [facility]... 09/18/11@4 ;40pm/LMB

## 2011-09-18 NOTE — Telephone Encounter (Signed)
Refill approved  . And faxed to pharmacy

## 2011-09-18 NOTE — Telephone Encounter (Addendum)
Ok to fax in signed rx

## 2011-09-18 NOTE — Telephone Encounter (Signed)
PATIENT DAUGHTER REQUEST REFILL ON PAIN MED HYDROCODONE  VICODIN TO THE [facility]. PLEASE ADVISE

## 2011-09-18 NOTE — Telephone Encounter (Signed)
ok 

## 2011-09-18 NOTE — Telephone Encounter (Signed)
Addended by: Elnora Morrison on: 09/18/2011 01:05 PM   Modules accepted: Orders

## 2011-09-18 NOTE — Telephone Encounter (Signed)
Pt's daughter advised of Rx/pharmacy 

## 2011-09-18 NOTE — Telephone Encounter (Signed)
Addended by: Rene Paci A on: 09/18/2011 12:47 PM   Modules accepted: Orders

## 2011-09-18 NOTE — Telephone Encounter (Signed)
Pt's daughter called requesting alprazolam refill on behalf of this Dr Debby Bud' pt. Please advise.

## 2011-09-19 ENCOUNTER — Ambulatory Visit (HOSPITAL_COMMUNITY): Payer: Self-pay | Admitting: Licensed Clinical Social Worker

## 2011-09-26 ENCOUNTER — Ambulatory Visit (HOSPITAL_COMMUNITY): Payer: Self-pay | Admitting: Psychiatry

## 2011-09-27 ENCOUNTER — Encounter: Payer: Self-pay | Admitting: Internal Medicine

## 2011-09-27 ENCOUNTER — Ambulatory Visit (INDEPENDENT_AMBULATORY_CARE_PROVIDER_SITE_OTHER): Payer: Medicare Other | Admitting: Internal Medicine

## 2011-09-27 VITALS — BP 140/58 | HR 60 | Temp 97.4°F | Resp 16 | Wt 161.5 lb

## 2011-09-27 DIAGNOSIS — F332 Major depressive disorder, recurrent severe without psychotic features: Secondary | ICD-10-CM

## 2011-09-27 MED ORDER — DULOXETINE HCL 30 MG PO CPEP: 30.0000 mg | ORAL_CAPSULE | Freq: Every day | ORAL | Status: DC

## 2011-09-27 NOTE — Patient Instructions (Signed)

## 2011-09-27 NOTE — Progress Notes (Signed)
Subjective:    Patient ID: Russell [name], male    DOB: 1924/02/13, 78 y.o.   MRN: [id number]  HPI He returns c/o persistent feelings of sadness and depression with crying spells, his children have been arguing and that upsets him. He has anhedonia and excessive worry. He does not have any sleep disturbance.   Review of Systems  Constitutional: Negative.   HENT: Negative.   Eyes: Negative.   Respiratory: Negative.   Cardiovascular: Negative.   Gastrointestinal: Negative.   Genitourinary: Negative.   Musculoskeletal: Negative.   Skin: Negative.   Neurological: Negative.   Hematological: Negative.   Psychiatric/Behavioral: Positive for dysphoric mood and decreased concentration. Negative for suicidal ideas, hallucinations, behavioral problems, confusion, sleep disturbance, self-injury and agitation. The patient is not nervous/anxious and is not hyperactive.        Objective:   Physical Exam  Vitals reviewed. Constitutional: He is oriented to person, place, and time. He appears well-developed and well-nourished. No distress.  HENT:  Head: Normocephalic and atraumatic.  Mouth/Throat: Oropharynx is clear and moist. No oropharyngeal exudate.  Eyes: Conjunctivae are normal. Right eye exhibits no discharge. Left eye exhibits no discharge. No scleral icterus.  Neck: Normal range of motion. Neck supple. No JVD present. No tracheal deviation present. No thyromegaly present.  Cardiovascular: Normal rate, regular rhythm, normal heart sounds and intact distal pulses.  Exam reveals no gallop and no friction rub.   No murmur heard. Pulmonary/Chest: Effort normal and breath sounds normal. No stridor. No respiratory distress. He has no wheezes. He has no rales. He exhibits no tenderness.  Abdominal: Soft. Bowel sounds are normal. He exhibits no distension and no mass. There is no tenderness. There is no rebound and no guarding.  Musculoskeletal: Normal range of motion. He exhibits no edema and no  tenderness.  Lymphadenopathy:    He has no cervical adenopathy.  Neurological: He is oriented to person, place, and time.  Skin: Skin is warm and dry. No rash noted. He is not diaphoretic. No erythema. No pallor.  Psychiatric: Judgment and thought content normal. His mood appears not anxious. His affect is not angry, not blunt, not labile and not inappropriate. His speech is not rapid and/or pressured, not delayed, not tangential and not slurred. He is slowed and withdrawn. He is not agitated, not aggressive, is not hyperactive, not actively hallucinating and not combative. Thought content is not paranoid and not delusional. Cognition and memory are normal. Cognition and memory are not impaired. He does not express impulsivity or inappropriate judgment. He exhibits a depressed mood. He expresses no homicidal and no suicidal ideation. He expresses no suicidal plans and no homicidal plans. He is communicative. He exhibits normal recent memory and normal remote memory. He is attentive.      Lab Results  Component Value Date   WBC 4.0* 01/22/2011   HGB 15.1 01/22/2011   HCT 45.6 01/22/2011   PLT 204.0 01/22/2011   GLUCOSE 95 08/06/2011   CHOL 126 08/06/2011   TRIG 48.0 08/06/2011   HDL 48.30 08/06/2011   LDLCALC 68 08/06/2011   ALT 21 08/06/2011   ALT 21 08/06/2011   AST 28 08/06/2011   AST 28 08/06/2011   NA 145 08/06/2011   K 3.9 08/06/2011   CL 111 08/06/2011   CREATININE 1.0 08/06/2011   BUN 29* 08/06/2011   CO2 27 08/06/2011   TSH 2.13 11/23/2010   PSA 0.39 06/30/2008   HGBA1C 5.7 08/06/2011   MICROALBUR 1.6 09/07/2009  Assessment & Plan:

## 2011-09-27 NOTE — Assessment & Plan Note (Signed)
I will add cymbalta to zoloft for now for better efficacy with added support of the NE reuptake, I will probably taper off zoloft over time, he can continue xanax as needed

## 2011-10-03 ENCOUNTER — Telehealth: Payer: Self-pay | Admitting: Internal Medicine

## 2011-10-03 NOTE — Telephone Encounter (Signed)
Message copied by COUSIN, Daiva Eves on Wed Oct 03, 2011  9:07 AM ------      Message from: Jacques Navy      Created: Tue Oct 02, 2011  6:14 PM      Regarding: RE: PT DAU IS REQUESTING SWITCH FOR PT TO DR Yetta Barre FROM DR Judith Blonder with me      ----- Message -----         From: Domenica Reamer         Sent: 10/02/2011   4:23 PM           To: Jacques Navy, MD, Domenica Reamer, #      Subject: PT DAU IS REQUESTING SWITCH FOR PT TO DR JON#            Dr Debby Bud and Dr Redge Gainer is requesting to switch to Dr Yetta Barre from Dr Debby Bud per his dau/Brenda because he feel more comfortable with Dr Jaci Carrel you both for your reply.

## 2011-10-03 NOTE — Telephone Encounter (Signed)
Message copied by COUSIN, Daiva Eves on Wed Oct 03, 2011  9:07 AM ------      Message from: Etta Grandchild      Created: Tue Oct 02, 2011  6:59 PM      Regarding: RE: PT DAU IS REQUESTING SWITCH FOR PT TO DR Yetta Barre FROM DR Debby Bud       ok      ----- Message -----         From: Domenica Reamer         Sent: 10/02/2011   4:23 PM           To: Jacques Navy, MD, Domenica Reamer, #      Subject: PT DAU IS REQUESTING SWITCH FOR PT TO DR JON#            Dr Debby Bud and Dr Redge Gainer is requesting to switch to Dr Yetta Barre from Dr Debby Bud per his dau/Brenda because he feel more comfortable with Dr Jaci Carrel you both for your reply.

## 2011-10-17 ENCOUNTER — Encounter: Payer: Self-pay | Admitting: Internal Medicine

## 2011-10-17 ENCOUNTER — Ambulatory Visit (INDEPENDENT_AMBULATORY_CARE_PROVIDER_SITE_OTHER): Payer: Medicare Other | Admitting: Internal Medicine

## 2011-10-17 ENCOUNTER — Telehealth: Payer: Self-pay | Admitting: Internal Medicine

## 2011-10-17 VITALS — BP 158/70 | HR 66 | Temp 98.3°F | Resp 16 | Wt 161.2 lb

## 2011-10-17 DIAGNOSIS — F332 Major depressive disorder, recurrent severe without psychotic features: Secondary | ICD-10-CM

## 2011-10-17 DIAGNOSIS — I10 Essential (primary) hypertension: Secondary | ICD-10-CM

## 2011-10-17 MED ORDER — OLMESARTAN MEDOXOMIL 40 MG PO TABS: 40.0000 mg | ORAL_TABLET | Freq: Every day | ORAL | Status: DC

## 2011-10-17 NOTE — Assessment & Plan Note (Signed)
His BP is not well controlled so I have added benicar to lower his BP and reduce the risk of diabetic renal disease and CV events

## 2011-10-17 NOTE — Progress Notes (Signed)
  Subjective:    Patient ID: Russell [name], male    DOB: 1923-12-18, 78 y.o.   MRN: [id number]  Hypertension This is a chronic problem. The current episode started more than 1 year ago. The problem is unchanged. The problem is uncontrolled. Pertinent negatives include no anxiety, blurred vision, chest pain, headaches, malaise/fatigue, neck pain, orthopnea, palpitations, peripheral edema, PND, shortness of breath or sweats. Past treatments include diuretics. The current treatment provides moderate improvement. Compliance problems include exercise and diet.       Review of Systems  Constitutional: Negative.  Negative for malaise/fatigue.  HENT: Negative.  Negative for neck pain.   Eyes: Negative.  Negative for blurred vision.  Respiratory: Negative for cough, chest tightness, shortness of breath, wheezing and stridor.   Cardiovascular: Negative for chest pain, palpitations, orthopnea, leg swelling and PND.  Gastrointestinal: Negative for nausea, vomiting, abdominal pain, diarrhea, abdominal distention and anal bleeding.  Genitourinary: Negative.   Musculoskeletal: Negative.   Skin: Negative for color change, pallor, rash and wound.  Neurological: Negative.  Negative for headaches.  Hematological: Negative for adenopathy. Does not bruise/bleed easily.  Psychiatric/Behavioral: Negative.  Negative for suicidal ideas, hallucinations, behavioral problems, confusion, disturbed wake/sleep cycle, self-injury, dysphoric mood, decreased concentration and agitation. The patient is not nervous/anxious and is not hyperactive.        Objective:   Physical Exam  Vitals reviewed. Constitutional: He is oriented to person, place, and time. He appears well-developed and well-nourished. No distress.  HENT:  Head: Normocephalic and atraumatic.  Mouth/Throat: Oropharynx is clear and moist. No oropharyngeal exudate.  Eyes: Conjunctivae are normal. Right eye exhibits no discharge. Left eye exhibits no discharge.  No scleral icterus.  Neck: Normal range of motion. Neck supple. No JVD present. No tracheal deviation present. No thyromegaly present.  Cardiovascular: Normal rate, regular rhythm and normal heart sounds.  Exam reveals no gallop and no friction rub.   No murmur heard. Pulmonary/Chest: Effort normal and breath sounds normal. No stridor. No respiratory distress. He has no wheezes. He has no rales. He exhibits no tenderness.  Abdominal: Bowel sounds are normal. He exhibits no distension and no mass. There is no tenderness. There is no rebound and no guarding.  Musculoskeletal: Normal range of motion. He exhibits edema (trace edema in BLE). He exhibits no tenderness.  Lymphadenopathy:    He has no cervical adenopathy.  Neurological: He is oriented to person, place, and time.  Skin: Skin is warm and dry. No rash noted. He is not diaphoretic. No erythema. No pallor.  Psychiatric: He has a normal mood and affect. His behavior is normal. Judgment and thought content normal.     Lab Results  Component Value Date   WBC 4.0* 01/22/2011   HGB 15.1 01/22/2011   HCT 45.6 01/22/2011   PLT 204.0 01/22/2011   GLUCOSE 95 08/06/2011   CHOL 126 08/06/2011   TRIG 48.0 08/06/2011   HDL 48.30 08/06/2011   LDLCALC 68 08/06/2011   ALT 21 08/06/2011   ALT 21 08/06/2011   AST 28 08/06/2011   AST 28 08/06/2011   NA 145 08/06/2011   K 3.9 08/06/2011   CL 111 08/06/2011   CREATININE 1.0 08/06/2011   BUN 29* 08/06/2011   CO2 27 08/06/2011   TSH 2.13 11/23/2010   PSA 0.39 06/30/2008   HGBA1C 5.7 08/06/2011   MICROALBUR 1.6 09/07/2009       Assessment & Plan:

## 2011-10-17 NOTE — Patient Instructions (Signed)

## 2011-10-17 NOTE — Assessment & Plan Note (Signed)
He feels much improved on Cymbalta

## 2011-10-17 NOTE — Telephone Encounter (Signed)
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 Has Appt At 4PM; Caller reports that patient is no longer having fast heart rate after resting; Patient denies shortness of breath and irregular heart beat at this time; Emergent s/s per Irregular Heartbeat guideline r/o with exception to "Decrease in activity or exercise ability and ongoing or repeated episodes of irregular pulse or pulse rate more than 90 beats/minute at rest." Instructed caller to keep current appointment for 4:00pm today 10/17/11.

## 2011-10-27 ENCOUNTER — Other Ambulatory Visit: Payer: Self-pay | Admitting: Internal Medicine

## 2011-10-29 ENCOUNTER — Other Ambulatory Visit: Payer: Self-pay | Admitting: Internal Medicine

## 2011-10-30 NOTE — Telephone Encounter (Signed)
Called renewal back to [facility] had to leave on Dr. Jenel Lucks mail. MD already updated EPIC... 10/30/11@9 :19am/LMB

## 2011-11-14 ENCOUNTER — Encounter: Payer: Self-pay | Admitting: Internal Medicine

## 2011-11-14 ENCOUNTER — Ambulatory Visit (INDEPENDENT_AMBULATORY_CARE_PROVIDER_SITE_OTHER): Payer: Medicare Other | Admitting: Internal Medicine

## 2011-11-14 VITALS — BP 130/70 | HR 80 | Temp 97.5°F | Resp 16 | Wt 161.2 lb

## 2011-11-14 DIAGNOSIS — S0083XA Contusion of other part of head, initial encounter: Secondary | ICD-10-CM | POA: Insufficient documentation

## 2011-11-14 DIAGNOSIS — S1093XA Contusion of unspecified part of neck, initial encounter: Secondary | ICD-10-CM

## 2011-11-14 DIAGNOSIS — I10 Essential (primary) hypertension: Secondary | ICD-10-CM

## 2011-11-14 NOTE — Patient Instructions (Signed)
Facial and Scalp Contusions  You have a contusion (bruise) on your face or scalp. Injuries around the face and head generally cause a lot of swelling, especially around the eyes. This is because the blood supply to this area is good and tissues are loose. Swelling from a contusion is usually better in 2-3 days. It may take a week or longer for a "black eye" to clear up completely.  HOME CARE INSTRUCTIONS    Apply ice packs to the injured area for about 15 to 20 minutes, 3 to 4 times a day, for the first couple days. This helps keep swelling down.   Use mild pain medicine as needed or instructed by your caregiver.   You may have a mild headache, slight dizziness, nausea, and weakness for a few days. This usually clears up with bed rest and mild pain medications.   Contact your caregiver if you are concerned about facial defects or have any difficulty with your bite or develop pain with chewing.  SEEK IMMEDIATE MEDICAL CARE IF:   You develop severe pain or a headache, unrelieved by medication.   You develop unusual sleepiness, confusion, personality changes, or vomiting.   You have a persistent nosebleed, double or blurred vision, or drainage from the nose or ear.   You have difficulty walking or using your arms or legs.  MAKE SURE YOU:    Understand these instructions.   Will watch your condition.   Will get help right away if you are not doing well or get worse.  Document Released: 05/24/2004 Document Revised: 04/05/2011 Document Reviewed: 03/02/2011  ExitCare Patient Information 2012 [employer].

## 2011-11-14 NOTE — Assessment & Plan Note (Signed)
His exam today is normal, he is recovering nicely from the fall

## 2011-11-14 NOTE — Progress Notes (Signed)
Subjective:    Patient ID: Russell [name], male    DOB: 04-May-1923, 78 y.o.   MRN: [id number]  Facial Injury  The incident occurred more than 1 week ago. The injury mechanism was a fall (he tripped on loose bricks and fell, hit his chin). There was no loss of consciousness. There was no blood loss. Quality: none now. The patient is experiencing no pain. Pertinent negatives include no blurred vision, disorientation, headaches, memory loss, numbness, tinnitus, vomiting or weakness. He has tried nothing for the symptoms.      Review of Systems  Constitutional: Negative.   HENT: Negative for hearing loss, nosebleeds, facial swelling, trouble swallowing, neck pain, neck stiffness, dental problem, sinus pressure and tinnitus.   Eyes: Negative.  Negative for blurred vision.  Respiratory: Negative.   Cardiovascular: Negative.   Gastrointestinal: Negative.  Negative for vomiting.  Genitourinary: Negative.   Musculoskeletal: Negative.   Skin: Negative.   Neurological: Negative for dizziness, tremors, seizures, syncope, facial asymmetry, speech difficulty, weakness, light-headedness, numbness and headaches.  Hematological: Negative for adenopathy. Does not bruise/bleed easily.  Psychiatric/Behavioral: Negative.  Negative for memory loss.       Objective:   Physical Exam  Vitals reviewed. Constitutional: He is oriented to person, place, and time. He appears well-developed and well-nourished. No distress.  HENT:  Head: Normocephalic and atraumatic. Head is without raccoon's eyes, without Battle's sign, without abrasion, without contusion, without laceration, without right periorbital erythema and without left periorbital erythema.  Right Ear: Hearing normal. No hemotympanum.  Left Ear: Hearing normal. No hemotympanum.  Mouth/Throat: No oropharyngeal exudate.  Eyes: Conjunctivae are normal. Right eye exhibits no discharge. Left eye exhibits no discharge. No scleral icterus.  Neck: Normal range of  motion. Neck supple. No JVD present. No tracheal deviation present. No thyromegaly present.  Cardiovascular: Normal rate, regular rhythm, normal heart sounds and intact distal pulses.  Exam reveals no gallop and no friction rub.   No murmur heard. Pulmonary/Chest: Effort normal and breath sounds normal. No stridor. No respiratory distress. He has no wheezes. He has no rales. He exhibits no tenderness.  Abdominal: Soft. Bowel sounds are normal. He exhibits no distension and no mass. There is no tenderness. There is no rebound and no guarding.  Musculoskeletal: Normal range of motion. He exhibits no edema and no tenderness.  Lymphadenopathy:    He has no cervical adenopathy.  Neurological: He is alert and oriented to person, place, and time. He has normal reflexes. He displays normal reflexes. No cranial nerve deficit. He exhibits normal muscle tone. Coordination normal.  Skin: Skin is warm and dry. No rash noted. He is not diaphoretic. No erythema. No pallor.  Psychiatric: He has a normal mood and affect. His behavior is normal. Judgment and thought content normal.      Lab Results  Component Value Date   WBC 4.0* 01/22/2011   HGB 15.1 01/22/2011   HCT 45.6 01/22/2011   PLT 204.0 01/22/2011   GLUCOSE 95 08/06/2011   CHOL 126 08/06/2011   TRIG 48.0 08/06/2011   HDL 48.30 08/06/2011   LDLCALC 68 08/06/2011   ALT 21 08/06/2011   ALT 21 08/06/2011   AST 28 08/06/2011   AST 28 08/06/2011   NA 145 08/06/2011   K 3.9 08/06/2011   CL 111 08/06/2011   CREATININE 1.0 08/06/2011   BUN 29* 08/06/2011   CO2 27 08/06/2011   TSH 2.13 11/23/2010   PSA 0.39 06/30/2008   HGBA1C 5.7 08/06/2011   MICROALBUR 1.6  09/07/2009      Assessment & Plan:

## 2011-11-14 NOTE — Assessment & Plan Note (Signed)
His BP is well controlled 

## 2011-11-15 LAB — HM DIABETES EYE EXAM

## 2011-12-01 ENCOUNTER — Other Ambulatory Visit (HOSPITAL_COMMUNITY): Payer: Self-pay | Admitting: Psychiatry

## 2011-12-01 DIAGNOSIS — F329 Major depressive disorder, single episode, unspecified: Secondary | ICD-10-CM

## 2011-12-02 ENCOUNTER — Other Ambulatory Visit (HOSPITAL_COMMUNITY): Payer: Self-pay | Admitting: Psychiatry

## 2011-12-04 ENCOUNTER — Other Ambulatory Visit: Payer: Self-pay | Admitting: Internal Medicine

## 2011-12-24 ENCOUNTER — Telehealth (HOSPITAL_COMMUNITY): Payer: Self-pay

## 2011-12-24 ENCOUNTER — Other Ambulatory Visit (HOSPITAL_COMMUNITY): Payer: Self-pay | Admitting: Psychiatry

## 2011-12-24 ENCOUNTER — Other Ambulatory Visit: Payer: Self-pay | Admitting: Internal Medicine

## 2011-12-24 NOTE — Telephone Encounter (Signed)
Ok to refill or does this med need to be sent to Dr. Yetta Barre?

## 2011-12-25 ENCOUNTER — Other Ambulatory Visit: Payer: Self-pay

## 2011-12-25 DIAGNOSIS — F329 Major depressive disorder, single episode, unspecified: Secondary | ICD-10-CM

## 2011-12-25 MED ORDER — SERTRALINE HCL 100 MG PO TABS: 100.0000 mg | ORAL_TABLET | Freq: Every day | ORAL | Status: DC

## 2011-12-25 NOTE — Telephone Encounter (Signed)
Send to PCP.

## 2011-12-25 NOTE — Telephone Encounter (Signed)
Received faxed refill request for zoloft

## 2011-12-26 NOTE — Telephone Encounter (Signed)
done

## 2012-01-15 ENCOUNTER — Telehealth: Payer: Self-pay | Admitting: Internal Medicine

## 2012-01-15 NOTE — Telephone Encounter (Signed)
Patient notified

## 2012-01-15 NOTE — Telephone Encounter (Signed)
Caller: Brenda Herbin/Child; Phone: [phone]; Reason for Call: Daughter calling to see if the office has samples for Cymbalta and Benicar 40mg .  Please call her back, states she will be leaving in .  If unable to reach on [phone], please call [phone] and [phone].  Thanks

## 2012-01-16 ENCOUNTER — Ambulatory Visit: Payer: Self-pay | Admitting: Internal Medicine

## 2012-01-18 ENCOUNTER — Ambulatory Visit: Payer: Self-pay | Admitting: Internal Medicine

## 2012-01-21 ENCOUNTER — Other Ambulatory Visit (INDEPENDENT_AMBULATORY_CARE_PROVIDER_SITE_OTHER): Payer: Medicare Other

## 2012-01-21 ENCOUNTER — Encounter: Payer: Self-pay | Admitting: Internal Medicine

## 2012-01-21 ENCOUNTER — Ambulatory Visit (INDEPENDENT_AMBULATORY_CARE_PROVIDER_SITE_OTHER): Payer: Medicare Other | Admitting: Internal Medicine

## 2012-01-21 VITALS — BP 128/60 | HR 30 | Temp 97.1°F | Resp 16 | Wt 166.2 lb

## 2012-01-21 DIAGNOSIS — E119 Type 2 diabetes mellitus without complications: Secondary | ICD-10-CM

## 2012-01-21 DIAGNOSIS — C189 Malignant neoplasm of colon, unspecified: Secondary | ICD-10-CM

## 2012-01-21 DIAGNOSIS — I10 Essential (primary) hypertension: Secondary | ICD-10-CM

## 2012-01-21 DIAGNOSIS — I498 Other specified cardiac arrhythmias: Secondary | ICD-10-CM

## 2012-01-21 DIAGNOSIS — R001 Bradycardia, unspecified: Secondary | ICD-10-CM

## 2012-01-21 DIAGNOSIS — C61 Malignant neoplasm of prostate: Secondary | ICD-10-CM

## 2012-01-21 DIAGNOSIS — R002 Palpitations: Secondary | ICD-10-CM

## 2012-01-21 DIAGNOSIS — Z23 Encounter for immunization: Secondary | ICD-10-CM

## 2012-01-21 DIAGNOSIS — E785 Hyperlipidemia, unspecified: Secondary | ICD-10-CM

## 2012-01-21 LAB — CBC WITH DIFFERENTIAL/PLATELET
Basophils Relative: 0.4 % (ref 0.0–3.0)
Eosinophils Absolute: 0.4 10*3/uL (ref 0.0–0.7)
HCT: 39.3 % (ref 39.0–52.0)
Hemoglobin: 12.9 g/dL — ABNORMAL LOW (ref 13.0–17.0)
Lymphocytes Relative: 18.4 % (ref 12.0–46.0)
Lymphs Abs: 0.9 10*3/uL (ref 0.7–4.0)
MCHC: 32.8 g/dL (ref 30.0–36.0)
MCV: 89.8 fl (ref 78.0–100.0)
Neutro Abs: 2.9 10*3/uL (ref 1.4–7.7)
RBC: 4.37 Mil/uL (ref 4.22–5.81)

## 2012-01-21 LAB — URINALYSIS, ROUTINE W REFLEX MICROSCOPIC
Bilirubin Urine: NEGATIVE
Nitrite: NEGATIVE
Total Protein, Urine: NEGATIVE
Urine Glucose: NEGATIVE
pH: 6 (ref 5.0–8.0)

## 2012-01-21 NOTE — Progress Notes (Signed)
Subjective:    Patient ID: Russell [name], male    DOB: 1923-12-24, 78 y.o.   MRN: [id number]  Palpitations  This is a new problem. The current episode started 1 to 4 weeks ago. The problem occurs intermittently. The problem has been unchanged. On average, each episode lasts 2 months. Nothing aggravates the symptoms. Associated symptoms include an irregular heartbeat and malaise/fatigue. Pertinent negatives include no anxiety, chest fullness, chest pain, coughing, diaphoresis, dizziness, fever, nausea, near-syncope, numbness, shortness of breath, syncope, vomiting or weakness. He has tried nothing for the symptoms. Risk factors include being male, diabetes mellitus and dyslipidemia. There is no history of anemia, anxiety, drug use, heart disease, hyperthyroidism or a valve disorder.      Review of Systems  Constitutional: Positive for malaise/fatigue. Negative for fever, chills, diaphoresis, activity change, appetite change, fatigue and unexpected weight change.  HENT: Negative.   Eyes: Negative.   Respiratory: Negative for apnea, cough, choking, chest tightness, shortness of breath and wheezing.   Cardiovascular: Positive for palpitations. Negative for chest pain, leg swelling, syncope and near-syncope.  Gastrointestinal: Negative for nausea, vomiting, abdominal pain, diarrhea, constipation and anal bleeding.  Genitourinary: Negative.   Musculoskeletal: Negative.  Negative for myalgias, back pain, joint swelling, arthralgias and gait problem.  Skin: Negative for color change, pallor, rash and wound.  Neurological: Negative for dizziness, tremors, seizures, syncope, facial asymmetry, speech difficulty, weakness, light-headedness, numbness and headaches.  Hematological: Negative for adenopathy. Does not bruise/bleed easily.  Psychiatric/Behavioral: Positive for confusion and decreased concentration. Negative for suicidal ideas, hallucinations, behavioral problems, disturbed wake/sleep cycle,  self-injury, dysphoric mood and agitation. The patient is not nervous/anxious and is not hyperactive.        Objective:   Physical Exam  Vitals reviewed. Constitutional: He is oriented to person, place, and time. He appears well-developed and well-nourished. No distress.  HENT:  Head: Normocephalic and atraumatic.  Mouth/Throat: Oropharynx is clear and moist. No oropharyngeal exudate.  Eyes: Conjunctivae normal are normal. Right eye exhibits no discharge. Left eye exhibits no discharge. No scleral icterus.  Neck: Normal range of motion. Neck supple. No JVD present. No tracheal deviation present. No thyromegaly present.  Cardiovascular: S1 normal, S2 normal, normal heart sounds and intact distal pulses.  An irregularly irregular rhythm present. Bradycardia present.  Exam reveals no gallop and no friction rub.   No murmur heard. Pulmonary/Chest: Effort normal and breath sounds normal. No stridor. No respiratory distress. He has no wheezes. He has no rales. He exhibits no tenderness.  Abdominal: Soft. Bowel sounds are normal. He exhibits no distension and no mass. There is no tenderness. There is no rebound and no guarding.  Musculoskeletal: Normal range of motion. He exhibits edema (trace edema over both ankles). He exhibits no tenderness.  Lymphadenopathy:    He has no cervical adenopathy.  Neurological: He is oriented to person, place, and time.  Skin: Skin is warm and dry. No rash noted. He is not diaphoretic. No erythema. No pallor.  Psychiatric: He has a normal mood and affect. His behavior is normal. Judgment and thought content normal.     Lab Results  Component Value Date   WBC 4.0* 01/22/2011   HGB 15.1 01/22/2011   HCT 45.6 01/22/2011   PLT 204.0 01/22/2011   GLUCOSE 95 08/06/2011   CHOL 126 08/06/2011   TRIG 48.0 08/06/2011   HDL 48.30 08/06/2011   LDLCALC 68 08/06/2011   ALT 21 08/06/2011   ALT 21 08/06/2011   AST 28 08/06/2011  AST 28 08/06/2011   NA 145 08/06/2011   K 3.9 08/06/2011    CL 111 08/06/2011   CREATININE 1.0 08/06/2011   BUN 29* 08/06/2011   CO2 27 08/06/2011   TSH 2.13 11/23/2010   PSA 0.39 06/30/2008   HGBA1C 5.7 08/06/2011   MICROALBUR 1.6 09/07/2009       Assessment & Plan:

## 2012-01-21 NOTE — Patient Instructions (Signed)
Palpitations  A palpitation is the feeling that your heartbeat is irregular or is faster than normal. Although this is frightening, it usually is not serious. Palpitations may be caused by excesses of smoking, caffeine, or alcohol. They are also brought on by stress and anxiety. Sometimes, they are caused by heart disease. Unless otherwise noted, your caregiver did not find any signs of serious illness at this time. HOME CARE INSTRUCTIONS  To help prevent palpitations:  Drink decaffeinated coffee, tea, and soda pop. Avoid chocolate.   If you smoke or drink alcohol, quit or cut down as much as possible.   Reduce your stress or anxiety level. Biofeedback, yoga, or meditation will help you relax. Physical activity such as swimming, jogging, or walking also may be helpful.  SEEK MEDICAL CARE IF:   You continue to have a fast heartbeat.   Your palpitations occur more often.  SEEK IMMEDIATE MEDICAL CARE IF: You develop chest pain, shortness of breath, severe headache, dizziness, or fainting. Document Released: 04/13/2000 Document Revised: 04/05/2011 Document Reviewed: 06/13/2007 [employer] Patient Information 2012 [employer]. 

## 2012-01-21 NOTE — Assessment & Plan Note (Signed)
Check his PSA today 

## 2012-01-21 NOTE — Assessment & Plan Note (Signed)
I will check his A1C today and will monitor his renal function 

## 2012-01-21 NOTE — Assessment & Plan Note (Signed)
His BP is well controlled, I will check his lytes and renal function 

## 2012-01-21 NOTE — Assessment & Plan Note (Signed)
CMP and TSH today

## 2012-01-21 NOTE — Assessment & Plan Note (Signed)
EKG shows bradycardia with frequent pvc's and bigeminy, he appears stable so I have asked him to see cardiology as an outpatient

## 2012-01-21 NOTE — Assessment & Plan Note (Signed)
Check his CEA level today

## 2012-01-22 ENCOUNTER — Encounter: Payer: Self-pay | Admitting: Internal Medicine

## 2012-01-22 LAB — COMPREHENSIVE METABOLIC PANEL
ALT: 28 U/L (ref 0–53)
AST: 38 U/L — ABNORMAL HIGH (ref 0–37)
CO2: 24 mEq/L (ref 19–32)
Calcium: 9.3 mg/dL (ref 8.4–10.5)
Chloride: 105 mEq/L (ref 96–112)
Creatinine, Ser: 1.2 mg/dL (ref 0.4–1.5)
Sodium: 143 mEq/L (ref 135–145)
Total Protein: 6.3 g/dL (ref 6.0–8.3)

## 2012-01-22 LAB — CEA: CEA: 1.1 ng/mL (ref 0.0–5.0)

## 2012-01-22 LAB — PSA: PSA: 1.35 ng/mL (ref 0.10–4.00)

## 2012-02-04 ENCOUNTER — Encounter: Payer: Self-pay | Admitting: Cardiovascular Disease

## 2012-02-04 ENCOUNTER — Ambulatory Visit (INDEPENDENT_AMBULATORY_CARE_PROVIDER_SITE_OTHER): Payer: Medicare Other | Admitting: Cardiovascular Disease

## 2012-02-04 VITALS — BP 172/58 | HR 64 | Resp 18 | Ht 66.0 in | Wt 172.0 lb

## 2012-02-04 DIAGNOSIS — R001 Bradycardia, unspecified: Secondary | ICD-10-CM

## 2012-02-04 DIAGNOSIS — I498 Other specified cardiac arrhythmias: Secondary | ICD-10-CM

## 2012-02-04 DIAGNOSIS — I4949 Other premature depolarization: Secondary | ICD-10-CM

## 2012-02-04 DIAGNOSIS — I493 Ventricular premature depolarization: Secondary | ICD-10-CM

## 2012-02-04 DIAGNOSIS — R011 Cardiac murmur, unspecified: Secondary | ICD-10-CM

## 2012-02-04 NOTE — Progress Notes (Signed)
HPI:  78 year old gentleman referred for evaluation of palpitations and PVCs. He has no documented history of cardiac disease. He specifically has no history of myocardial infarction, stroke, TIA, or congestive heart failure. He was seen by Dr. Yetta Barre last month and was noted to have palpitations and PVCs on evaluation.  The patient has had episodes where his pulse is been checked and has been in the 30s. He admits to some palpitations in the past but tells me this has not been a problem over the past few weeks. He complains of generalized fatigue, but really has no specific symptoms at this time. He denies chest pain, chest pressure, dyspnea, orthopnea, PND, lightheadedness, or syncope. He does admit to lower extremity swelling on a chronic basis, right worse than left leg.  Outpatient Encounter Prescriptions as of 02/04/2012  Medication Sig Dispense Refill  . ALPRAZolam (XANAX) 0.5 MG tablet TAKE ONE-HALF TO ONE TABLET BY MOUTH EVERY 6 HOURS AS NEEDED FOR ANXIETY  30 tablet  3  . aspirin 81 MG tablet Take 81 mg by mouth daily.        . B Complex Vitamins (VITAMIN-B COMPLEX PO) Take by mouth.        . DULoxetine (CYMBALTA) 30 MG capsule Take 1 capsule (30 mg total) by mouth daily.  30 capsule  0  . hydrochlorothiazide (HYDRODIURIL) 25 MG tablet TAKE ONE TABLET BY MOUTH EVERY DAY IN THE MORNING  30 tablet  5  . HYDROcodone-acetaminophen (VICODIN) 5-500 MG per tablet TAKE ONE TABLET BY MOUTH EVERY 6 HOURS AS NEEDED FOR PAIN  60 tablet  3  . Multiple Vitamin (ONE-A-DAY MENS PO) Take by mouth.        . olmesartan (BENICAR) 40 MG tablet Take 1 tablet (40 mg total) by mouth daily.  70 tablet  0  . omeprazole (PRILOSEC OTC) 20 MG tablet Take 20 mg by mouth daily as needed.      Marland Kitchen oxybutynin (DITROPAN) 5 MG tablet Take 5 mg by mouth 2 (two) times daily.        . simvastatin (ZOCOR) 80 MG tablet TAKE ONE-HALF TABLET BY MOUTH EVERY DAY AT BEDTIME  90 tablet  3    Review of patient's allergies indicates  no known allergies.  Past Medical History  Diagnosis Date  . Hyperlipidemia   . Hypertension   . Dementia   . Anxiety associated with depression   . Urolithiasis   . History of cystoscopy     uretheral stricutre, 2005- Dr.Humphreys  . FTT (failure to thrive)     admited 08/2008  . Osteoarthritis     hands  . Colon cancer     s/p resection 12/06  . Prostate cancer     s/p XRT  . Diabetes mellitus     diet mgt (11`/12)  . Loss of hearing     being fitted for hearing aids (11/12)    Past Surgical History  Procedure Date  . Hemicolectomy 12/06  . Transurethral resection of prostate   . Xrt     ERT for prostate ca  . Cataract extraction, bilateral     History   Social History  . Marital Status: Widowed    Spouse Name: N/A    Number of Children: N/A  . Years of Education: N/A   Occupational History  . Not on file.   Social History Main Topics  . Smoking status: Former Smoker    Quit date: 03/20/1967  . Smokeless tobacco: Never Used  .  Alcohol Use: No     quit '68  . Drug Use: No  . Sexually Active: Not Currently   Other Topics Concern  . Not on file   Social History Narrative   7th grade education. Married '51 - 9/09. 3 daughters- '51, '53, '66; 1 son- '54. Lives w/ daughter Thelma Barge who has schizophrenia since age 65. 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 Diabetes Daughter    ROS: General: no fevers/chills/night sweats Eyes: no blurry vision, diplopia, or amaurosis ENT: no sore throat or hearing loss Resp: no cough, wheezing, or hemoptysis CV: See history of present illness GI: no abdominal pain, nausea, vomiting, diarrhea, or constipation GU: no dysuria, frequency, or hematuria Skin: no rash Neuro: no headache, numbness, tingling, or weakness of extremities Musculoskeletal: no joint pain or swelling Heme: no bleeding, DVT, or easy bruising Endo: no polydipsia or polyuria  BP 172/58  Pulse 64   Resp 18  Ht 5\' 6"  (1.676 m)  Wt 78.019 kg (172 lb)  BMI 27.76 kg/m2  PHYSICAL EXAM: Pt is alert and oriented, WD, WN, pleasant elderly male in no distress. HEENT: normal Neck: JVP normal. Carotid upstrokes normal without bruits. No thyromegaly. Lungs: equal expansion, clear bilaterally CV: Apex is discrete and nondisplaced, regular with frequent premature beats with grade 2/6 harsh early peaking systolic murmur best heard at the apex Abd: soft, NT, +BS, no bruit, no hepatosplenomegaly Back: no CVA tenderness Ext: 2+ right pretibial edema, 1+ left pretibial edema        Femoral pulses 2+= without bruits        DP/PT pulses intact and = Skin: warm and dry without rash Neuro: CNII-XII intact             Strength intact = bilaterally  EKG:  Sinus rhythm 64 beats per minute,  PVC's, voltage criteria for LVH.  ASSESSMENT AND PLAN: 1. Cardiac palpitations. Frequent PVCs have been noted by history and are observed on exam and 12-lead EKG. I have recommended a 24-hour Holter monitor to make sure that he is not having any sustained ventricular arrhythmias. From a symptomatic perspective, he appears to have fairly mild symptoms at this point. He will also have an echocardiogram to rule out LV dysfunction or structural heart disease.  2. Cardiac murmur. Suspect mild aortic stenosis and will check an echocardiogram to confirm. He has no symptoms of congestive heart failure or angina.  For followup, I would like to see the patient back on an as needed basis, unless his Holter monitor or echocardiogram show significant abnormalities. We will followup with him soon as the results of this testing are available.

## 2012-02-04 NOTE — Patient Instructions (Signed)
Your physician has recommended that you wear a holter monitor. Holter monitors are medical devices that record the heart's electrical activity. Doctors most often use these monitors to diagnose arrhythmias. Arrhythmias are problems with the speed or rhythm of the heartbeat. The monitor is a small, portable device. You can wear one while you do your normal daily activities. This is usually used to diagnose what is causing palpitations/syncope (passing out).  Your physician has requested that you have an echocardiogram. Echocardiography is a painless test that uses sound waves to create images of your heart. It provides your doctor with information about the size and shape of your heart and how well your heart's chambers and valves are working. This procedure takes approximately one hour. There are no restrictions for this procedure.  Your physician recommends that you schedule a follow-up appointment in: as needed basis

## 2012-02-06 ENCOUNTER — Ambulatory Visit: Payer: Self-pay | Admitting: Cardiovascular Disease

## 2012-02-12 ENCOUNTER — Ambulatory Visit (HOSPITAL_COMMUNITY): Payer: Medicare Other | Attending: Cardiovascular Disease

## 2012-02-12 ENCOUNTER — Encounter (INDEPENDENT_AMBULATORY_CARE_PROVIDER_SITE_OTHER): Payer: Medicare Other

## 2012-02-12 DIAGNOSIS — E119 Type 2 diabetes mellitus without complications: Secondary | ICD-10-CM | POA: Insufficient documentation

## 2012-02-12 DIAGNOSIS — I10 Essential (primary) hypertension: Secondary | ICD-10-CM | POA: Insufficient documentation

## 2012-02-12 DIAGNOSIS — R002 Palpitations: Secondary | ICD-10-CM

## 2012-02-12 DIAGNOSIS — I493 Ventricular premature depolarization: Secondary | ICD-10-CM

## 2012-02-12 DIAGNOSIS — I080 Rheumatic disorders of both mitral and aortic valves: Secondary | ICD-10-CM | POA: Insufficient documentation

## 2012-02-12 DIAGNOSIS — I369 Nonrheumatic tricuspid valve disorder, unspecified: Secondary | ICD-10-CM | POA: Insufficient documentation

## 2012-02-12 DIAGNOSIS — I379 Nonrheumatic pulmonary valve disorder, unspecified: Secondary | ICD-10-CM | POA: Insufficient documentation

## 2012-02-12 DIAGNOSIS — R011 Cardiac murmur, unspecified: Secondary | ICD-10-CM

## 2012-02-12 NOTE — Progress Notes (Signed)
Echocardiogram performed.  

## 2012-02-13 ENCOUNTER — Ambulatory Visit (INDEPENDENT_AMBULATORY_CARE_PROVIDER_SITE_OTHER): Payer: Medicare Other | Admitting: Internal Medicine

## 2012-02-13 ENCOUNTER — Encounter: Payer: Self-pay | Admitting: Internal Medicine

## 2012-02-13 VITALS — BP 138/60 | HR 59 | Temp 97.8°F | Resp 18 | Ht 67.0 in | Wt 170.8 lb

## 2012-02-13 DIAGNOSIS — R001 Bradycardia, unspecified: Secondary | ICD-10-CM

## 2012-02-13 DIAGNOSIS — I498 Other specified cardiac arrhythmias: Secondary | ICD-10-CM

## 2012-02-13 DIAGNOSIS — I10 Essential (primary) hypertension: Secondary | ICD-10-CM

## 2012-02-13 NOTE — Assessment & Plan Note (Signed)
I am waiting for the results of his event monitor

## 2012-02-13 NOTE — Assessment & Plan Note (Signed)
Event monitor is on progress

## 2012-02-13 NOTE — Patient Instructions (Signed)

## 2012-02-13 NOTE — Progress Notes (Signed)
  Subjective:    Patient ID: Russell [name], male    DOB: 1923/07/19, 78 y.o.   MRN: [id number]  Hypertension This is a chronic problem. The current episode started more than 1 year ago. The problem is unchanged. The problem is controlled. Pertinent negatives include no anxiety, blurred vision, chest pain, headaches, malaise/fatigue, neck pain, orthopnea, palpitations, peripheral edema, PND, shortness of breath or sweats. There are no associated agents to hypertension. Past treatments include diuretics and angiotensin blockers. The current treatment provides moderate improvement. There are no compliance problems.       Review of Systems  Constitutional: Negative.  Negative for malaise/fatigue.  HENT: Negative.  Negative for neck pain.   Eyes: Negative.  Negative for blurred vision.  Respiratory: Negative for apnea, cough, choking, chest tightness, shortness of breath, wheezing and stridor.   Cardiovascular: Negative for chest pain, palpitations, orthopnea, leg swelling and PND.  Gastrointestinal: Negative for nausea, vomiting, abdominal pain, diarrhea and constipation.  Genitourinary: Negative.   Musculoskeletal: Negative.   Skin: Negative.   Neurological: Negative for dizziness, syncope, weakness, light-headedness and headaches.  Hematological: Negative for adenopathy. Does not bruise/bleed easily.       Objective:   Physical Exam  Vitals reviewed. Constitutional: He is oriented to person, place, and time. He appears well-developed and well-nourished. No distress.  HENT:  Head: Normocephalic and atraumatic.  Mouth/Throat: Oropharynx is clear and moist. No oropharyngeal exudate.  Eyes: Conjunctivae normal are normal. Right eye exhibits no discharge. Left eye exhibits no discharge. No scleral icterus.  Neck: Normal range of motion. Neck supple. No JVD present. No tracheal deviation present. No thyromegaly present.  Cardiovascular: Normal rate, regular rhythm, normal heart sounds and  intact distal pulses.  Exam reveals no gallop and no friction rub.   No murmur heard. Pulmonary/Chest: Effort normal and breath sounds normal. No stridor. No respiratory distress. He has no wheezes. He has no rales. He exhibits no tenderness.  Abdominal: Soft. Bowel sounds are normal. He exhibits no distension and no mass. There is no tenderness. There is no rebound and no guarding.  Musculoskeletal: Normal range of motion. He exhibits no edema and no tenderness.  Lymphadenopathy:    He has no cervical adenopathy.  Neurological: He is oriented to person, place, and time.  Skin: Skin is warm and dry. No rash noted. He is not diaphoretic. No erythema. No pallor.  Psychiatric: He has a normal mood and affect. His behavior is normal. Judgment and thought content normal.      Lab Results  Component Value Date   WBC 4.9 01/21/2012   HGB 12.9* 01/21/2012   HCT 39.3 01/21/2012   PLT 183.0 01/21/2012   GLUCOSE 91 01/21/2012   CHOL 126 08/06/2011   TRIG 48.0 08/06/2011   HDL 48.30 08/06/2011   LDLCALC 68 08/06/2011   ALT 28 01/21/2012   AST 38* 01/21/2012   NA 143 01/21/2012   K 4.6 01/21/2012   CL 105 01/21/2012   CREATININE 1.2 01/21/2012   BUN 30* 01/21/2012   CO2 24 01/21/2012   TSH 3.25 01/21/2012   PSA 1.35 01/21/2012   HGBA1C 5.6 01/21/2012   MICROALBUR 1.6 09/07/2009      Assessment & Plan:

## 2012-02-13 NOTE — Assessment & Plan Note (Signed)
His BP is well controlled 

## 2012-02-27 ENCOUNTER — Encounter: Payer: Self-pay | Admitting: Internal Medicine

## 2012-02-27 ENCOUNTER — Ambulatory Visit (INDEPENDENT_AMBULATORY_CARE_PROVIDER_SITE_OTHER): Payer: Medicare Other | Admitting: Internal Medicine

## 2012-02-27 VITALS — BP 168/62 | HR 47 | Resp 18 | Ht 66.0 in | Wt 164.0 lb

## 2012-02-27 DIAGNOSIS — R002 Palpitations: Secondary | ICD-10-CM

## 2012-02-27 DIAGNOSIS — I493 Ventricular premature depolarization: Secondary | ICD-10-CM

## 2012-02-27 DIAGNOSIS — I4949 Other premature depolarization: Secondary | ICD-10-CM

## 2012-02-27 NOTE — Patient Instructions (Addendum)
Your physician recommends that you schedule a follow-up appointment in: 4 weeks with Dr Klein    

## 2012-02-27 NOTE — Assessment & Plan Note (Signed)
Unbeknownst to the patient and to me I had seen him before a couple of years ago for the same issue. The thoughts are still the same. There is no clear association of his PVCs with symptoms, there is no evidence of impact on his react function and his is no indication for therapy.  I have reviewed with her his daughter how to take his pulse and we'll try and see if we can correlate ventricular ectopy functional bradycardia with any symptoms. Our review again with him in 6 weeks

## 2012-02-27 NOTE — Progress Notes (Signed)
ELECTROPHYSIOLOGY CONSULT NOTE  Patient ID: Russell [name], MRN: [id number], DOB/AGE: 76-Dec-1925 76 y.o. Admit date: (Not on file) Date of Consult: 02/27/2012  Primary Physician: Sanda Linger, MD Primary Cardiologist: mc Chief Complaint: pvc   HPI Russell [name] is a 78 y.o. male seen at the request of Dr. Excell Seltzer because of palpitations and PVCs. He has no known history of cardiac disease. No prior MI congestive heart failure prior stroke. When he saw Dr. Excell Seltzer he noted generalized fatigue but not any specific symptoms of chest discomfort dyspnea lightheadedness or syncope.   A 24-hour Holter monitor was done and demonstrated frequent PVCs was 20,000 beats a total of 87,000. An echocardiogram demonstrated ejection fraction 50-55% with moderate MR and biatrial enlargement   Is some fatigue. Is not clear that he correlates with his ectopy. Mostly he has no specific complaints to me similarly if he had non-to Dr. Excell Seltzer.    Past Medical History  Diagnosis Date  . Hyperlipidemia   . Hypertension   . Dementia   . Anxiety associated with depression   . Urolithiasis   . History of cystoscopy     uretheral stricutre, 2005- Dr.Humphreys  . FTT (failure to thrive)     admited 08/2008  . Osteoarthritis     hands  . Colon cancer     s/p resection 12/06  . Prostate cancer     s/p XRT  . Diabetes mellitus     diet mgt (11`/12)  . Loss of hearing     being fitted for hearing aids (11/12)      Surgical History:  Past Surgical History  Procedure Date  . Hemicolectomy 12/06  . Transurethral resection of prostate   . Xrt     ERT for prostate ca  . Cataract extraction, bilateral      Home Meds: Prior to Admission medications   Medication Sig Start Date End Date Taking? Authorizing Provider  ALPRAZolam Prudy Feeler) 0.5 MG tablet TAKE ONE-HALF TO ONE TABLET BY MOUTH EVERY 6 HOURS AS NEEDED FOR ANXIETY 12/04/11  Yes Etta Grandchild, MD  aspirin 81 MG tablet Take 81 mg by mouth daily.     Yes  Historical Provider, MD  B Complex Vitamins (VITAMIN-B COMPLEX PO) Take by mouth.     Yes Historical Provider, MD  DULoxetine (CYMBALTA) 30 MG capsule Take 1 capsule (30 mg total) by mouth daily. 09/27/11 09/26/12 Yes Etta Grandchild, MD  hydrochlorothiazide (HYDRODIURIL) 25 MG tablet TAKE ONE TABLET BY MOUTH EVERY DAY IN THE MORNING 10/27/11  Yes Jacques Navy, MD  HYDROcodone-acetaminophen (VICODIN) 5-500 MG per tablet TAKE ONE TABLET BY MOUTH EVERY 6 HOURS AS NEEDED FOR PAIN 10/29/11  Yes Jacques Navy, MD  Multiple Vitamin (ONE-A-DAY MENS PO) Take by mouth.     Yes Historical Provider, MD  olmesartan (BENICAR) 40 MG tablet Take 1 tablet (40 mg total) by mouth daily. 10/17/11 10/16/12 Yes Etta Grandchild, MD  omeprazole (PRILOSEC OTC) 20 MG tablet Take 20 mg by mouth daily as needed.   Yes Historical Provider, MD  oxybutynin (DITROPAN) 5 MG tablet Take 5 mg by mouth 2 (two) times daily.     Yes Historical Provider, MD  sertraline (ZOLOFT) 100 MG tablet  01/28/12  Yes Historical Provider, MD  simvastatin (ZOCOR) 80 MG tablet TAKE ONE-HALF TABLET BY MOUTH EVERY DAY AT BEDTIME 12/24/11  Yes Etta Grandchild, MD     Allergies: No Known Allergies  History   Social History  .  Marital Status: Widowed    Spouse Name: N/A    Number of Children: N/A  . Years of Education: N/A   Occupational History  . Not on file.   Social History Main Topics  . Smoking status: Former Smoker    Quit date: 03/20/1967  . Smokeless tobacco: Never Used  . Alcohol Use: No     quit '68  . Drug Use: No  . Sexually Active: Not Currently   Other Topics Concern  . Not on file   Social History Narrative   7th grade education. Married '51 - 9/09. 3 daughters- '51, '53, '66; 1 son- '54. Lives w/ daughter Thelma Barge who has schizophrenia since age 18. 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 Diabetes Daughter      ROS:  as per his visit with Dr. Excell Seltzer   Physical  Exam:  Blood pressure 168/62, pulse 47, resp. rate 18, height 5\' 6"  (1.676 m), weight 164 lb (74.39 kg), SpO2 97.00%. General: Well developed, well nourished elderly age appearing African American  male in no acute distress. Head: Normocephalic, atraumatic, sclera non-icteric, no xanthomas, nares are without discharge.Marland Kitchen Hearing aid in place poor dentition Lymph Nodes:  none Back: without scoliosis/kyphosis, no CVA tendersness Neck: Negative for carotid bruits. JVD  7-8 cm Lungs: Clear bilaterally to auscultation without wheezes, rales, or rhonchi. Breathing is unlabored. Heart:   irregular rhythm with frequent ectopic beats a 2/6 murmur for a long right sternal borderb  Abdomen: Soft, non-tender, non-distended with normoactive bowel sounds. No hepatomegaly. No rebound/guarding. No obvious abdominal masses. Msk:  Strength and tone appear normal for age. Extremities: No clubbing or cyanosis. bilateral right greater than left edema. She is of for this is for   edema.  Distal pedal pulses are 2+ and equal bilaterally. Skin: Warm and Dry Neuro: Alert and oriented X 3. CN III-XII intact Grossly normal sensory and motor function . Psych:  Responds to questions appropriately with a normal affect.      Labs: Cardiac Enzymes No results found for this basename: CKTOTAL:4,CKMB:4,TROPONINI:4 in the last 72 hours CBC Lab Results  Component Value Date   WBC 4.9 01/21/2012   HGB 12.9* 01/21/2012   HCT 39.3 01/21/2012   MCV 89.8 01/21/2012   PLT 183.0 01/21/2012   PROTIME: No results found for this basename: LABPROT:3,INR:3 in the last 72 hours Chemistry No results found for this basename: NA,K,CL,CO2,BUN,CREATININE,CALCIUM,LABALBU,PROT,BILITOT,ALKPHOS,ALT,AST,GLUCOSE in the last 168 hours Lipids Lab Results  Component Value Date   CHOL 126 08/06/2011   HDL 48.30 08/06/2011   LDLCALC 68 08/06/2011   TRIG 48.0 08/06/2011   BNP No results found for this basename: probnp   Miscellaneous No results found  for this basename: DDIMER    Radiology/Studies:  No results found.  EKG: *sinus rhythm at 54 Interval 17/09/45) axis LIX  The monitor demonstrated heart rate excursion from 45-93 with 20% PVCs of a single dominant morphology   Assessment and Plan:  Sherryl Manges

## 2012-04-03 ENCOUNTER — Telehealth: Payer: Self-pay | Admitting: Internal Medicine

## 2012-04-03 NOTE — Telephone Encounter (Signed)
Russell [name] patient.  Daughter Renato Shin.  Is requesting samples of Cymbalta for her father.  She states that the office normally gives him samples of the medication. She would like to come by today and pick some up . Please contact her on her father cell phone [phone].

## 2012-04-03 NOTE — Telephone Encounter (Signed)
Pt's daughter advised, samples in cabinet for pick up

## 2012-04-04 ENCOUNTER — Ambulatory Visit (INDEPENDENT_AMBULATORY_CARE_PROVIDER_SITE_OTHER): Payer: Medicare Other | Admitting: Internal Medicine

## 2012-04-04 ENCOUNTER — Encounter: Payer: Self-pay | Admitting: Internal Medicine

## 2012-04-04 VITALS — BP 151/71 | HR 65 | Resp 18 | Ht 66.0 in | Wt 171.0 lb

## 2012-04-04 DIAGNOSIS — I4949 Other premature depolarization: Secondary | ICD-10-CM

## 2012-04-04 DIAGNOSIS — I493 Ventricular premature depolarization: Secondary | ICD-10-CM

## 2012-04-04 NOTE — Assessment & Plan Note (Signed)
PVCs continued to be recorded electrocardiographically. There continued to be no attributable symptoms. We'll not treat him at this time. We will see him again for request of his primary care physician

## 2012-04-04 NOTE — Progress Notes (Signed)
Patient Care Team: Etta Grandchild, MD as PCP - General (Internal Medicine)   HPI  Russell [name] is a 78 y.o. male Seen in followup for PVCs with a high frequency burden of 10-20% lesion present for some years but for who has not been closed symptomatic correlation. When I saw him last, 10/13, the idea was to see if we could correlate with   functional bradycardia or palpitations with symptoms  There have been no significant symptoms. The daughters have no insight into changes in functional status  Past Medical History  Diagnosis Date  . Hyperlipidemia   . Hypertension   . Dementia   . Anxiety associated with depression   . Urolithiasis   . History of cystoscopy     uretheral stricutre, 2005- Dr.Humphreys  . FTT (failure to thrive)     admited 08/2008  . Osteoarthritis     hands  . Colon cancer     s/p resection 12/06  . Prostate cancer     s/p XRT  . Diabetes mellitus     diet mgt (11`/12)  . Loss of hearing     being fitted for hearing aids (11/12)    Past Surgical History  Procedure Date  . Hemicolectomy 12/06  . Transurethral resection of prostate   . Xrt     ERT for prostate ca  . Cataract extraction, bilateral     Current Outpatient Prescriptions  Medication Sig Dispense Refill  . ALPRAZolam (XANAX) 0.5 MG tablet TAKE ONE-HALF TO ONE TABLET BY MOUTH EVERY 6 HOURS AS NEEDED FOR ANXIETY  30 tablet  3  . aspirin 81 MG tablet Take 81 mg by mouth daily.        . B Complex Vitamins (VITAMIN-B COMPLEX PO) Take by mouth.        . DULoxetine (CYMBALTA) 30 MG capsule Take 1 capsule (30 mg total) by mouth daily.  30 capsule  0  . hydrochlorothiazide (HYDRODIURIL) 25 MG tablet TAKE ONE TABLET BY MOUTH EVERY DAY IN THE MORNING  30 tablet  5  . HYDROcodone-acetaminophen (VICODIN) 5-500 MG per tablet TAKE ONE TABLET BY MOUTH EVERY 6 HOURS AS NEEDED FOR PAIN  60 tablet  3  . Multiple Vitamin (ONE-A-DAY MENS PO) Take by mouth.        . olmesartan (BENICAR) 40 MG tablet Take 1  tablet (40 mg total) by mouth daily.  70 tablet  0  . omeprazole (PRILOSEC OTC) 20 MG tablet Take 20 mg by mouth daily as needed.      Marland Kitchen oxybutynin (DITROPAN) 5 MG tablet Take 5 mg by mouth 2 (two) times daily.        . sertraline (ZOLOFT) 100 MG tablet       . simvastatin (ZOCOR) 80 MG tablet TAKE ONE-HALF TABLET BY MOUTH EVERY DAY AT BEDTIME  90 tablet  3    No Known Allergies  Review of Systems negative except from HPI and PMH  Physical Exam BP 151/71  Pulse 65  Resp 18  Ht 5\' 6"  (1.676 m)  Wt 171 lb (77.565 kg)  BMI 27.60 kg/m2  SpO2 99% Well developed and well nourished in no acute distress HENT normal E scleral and icterus clear Neck Supple   Clear to ausculation IrRegular rate and rhythm,  Soft with active bowel sounds No clubbing cyanosis none Edema Alert and oriented, grossly normal motor and sensory function Skin Warm and Dry    Assessment and  Plan

## 2012-04-17 ENCOUNTER — Ambulatory Visit: Payer: Self-pay | Admitting: Internal Medicine

## 2012-04-17 ENCOUNTER — Other Ambulatory Visit: Payer: Self-pay | Admitting: *Deleted

## 2012-04-17 ENCOUNTER — Telehealth: Payer: Self-pay | Admitting: *Deleted

## 2012-04-17 DIAGNOSIS — F332 Major depressive disorder, recurrent severe without psychotic features: Secondary | ICD-10-CM

## 2012-04-17 MED ORDER — ALPRAZOLAM 0.5 MG PO TABS: ORAL_TABLET | ORAL | Status: DC

## 2012-04-17 MED ORDER — SERTRALINE HCL 100 MG PO TABS: 100.0000 mg | ORAL_TABLET | Freq: Every day | ORAL | Status: DC

## 2012-04-17 MED ORDER — DULOXETINE HCL 30 MG PO CPEP: 30.0000 mg | ORAL_CAPSULE | Freq: Every day | ORAL | Status: DC

## 2012-04-17 NOTE — Telephone Encounter (Signed)
done

## 2012-04-17 NOTE — Telephone Encounter (Signed)
Pt's daughter called requesting refills of Alprazolam, Cymbalta and Sertraline. Please advise on Alprazolam rx-last written 12/04/2011 #30 with 3 refills-also please advise on Sertraline rx-is pt supposed to be taking both Cymbalta and Sertraline?

## 2012-04-17 NOTE — Telephone Encounter (Signed)
Pt's daughter called requesting refill of Cymbalta and Sertraline. Per daughter, pt has been taking both medications, but per 01/21/2012 OV, rx for Sertraline was discontinued due to change in therapy. Please advise on refill of Sertraline.

## 2012-05-15 ENCOUNTER — Other Ambulatory Visit: Payer: Self-pay | Admitting: Internal Medicine

## 2012-05-15 MED ORDER — HYDROCHLOROTHIAZIDE 25 MG PO TABS: 25.0000 mg | ORAL_TABLET | Freq: Every day | ORAL | Status: DC

## 2012-05-15 NOTE — Addendum Note (Signed)
Addended by: Elnora Morrison on: 05/15/2012 04:50 PM   Modules accepted: Orders

## 2012-06-05 ENCOUNTER — Ambulatory Visit (INDEPENDENT_AMBULATORY_CARE_PROVIDER_SITE_OTHER): Payer: Medicare Other | Admitting: Internal Medicine

## 2012-06-05 ENCOUNTER — Encounter: Payer: Self-pay | Admitting: Internal Medicine

## 2012-06-05 ENCOUNTER — Other Ambulatory Visit (INDEPENDENT_AMBULATORY_CARE_PROVIDER_SITE_OTHER): Payer: Medicare Other

## 2012-06-05 VITALS — BP 122/60 | HR 41 | Temp 97.6°F | Resp 16 | Wt 164.0 lb

## 2012-06-05 DIAGNOSIS — I4949 Other premature depolarization: Secondary | ICD-10-CM

## 2012-06-05 DIAGNOSIS — I10 Essential (primary) hypertension: Secondary | ICD-10-CM

## 2012-06-05 DIAGNOSIS — R55 Syncope and collapse: Secondary | ICD-10-CM

## 2012-06-05 DIAGNOSIS — I498 Other specified cardiac arrhythmias: Secondary | ICD-10-CM

## 2012-06-05 DIAGNOSIS — I493 Ventricular premature depolarization: Secondary | ICD-10-CM

## 2012-06-05 DIAGNOSIS — R001 Bradycardia, unspecified: Secondary | ICD-10-CM

## 2012-06-05 DIAGNOSIS — F332 Major depressive disorder, recurrent severe without psychotic features: Secondary | ICD-10-CM

## 2012-06-05 LAB — CBC WITH DIFFERENTIAL/PLATELET
Basophils Relative: 0.3 % (ref 0.0–3.0)
Eosinophils Absolute: 0.4 10*3/uL (ref 0.0–0.7)
Hemoglobin: 14.5 g/dL (ref 13.0–17.0)
Lymphs Abs: 0.9 10*3/uL (ref 0.7–4.0)
MCHC: 33.5 g/dL (ref 30.0–36.0)
MCV: 87.5 fl (ref 78.0–100.0)
Monocytes Absolute: 0.7 10*3/uL (ref 0.1–1.0)
Neutro Abs: 3.1 10*3/uL (ref 1.4–7.7)
RBC: 4.93 Mil/uL (ref 4.22–5.81)

## 2012-06-05 LAB — COMPREHENSIVE METABOLIC PANEL
AST: 26 U/L (ref 0–37)
Alkaline Phosphatase: 97 U/L (ref 39–117)
BUN: 31 mg/dL — ABNORMAL HIGH (ref 6–23)
Calcium: 8.9 mg/dL (ref 8.4–10.5)
Creatinine, Ser: 1.4 mg/dL (ref 0.4–1.5)
Total Bilirubin: 0.8 mg/dL (ref 0.3–1.2)

## 2012-06-05 MED ORDER — DULOXETINE HCL 30 MG PO CPEP: 30.0000 mg | ORAL_CAPSULE | Freq: Every day | ORAL | Status: DC

## 2012-06-05 NOTE — Assessment & Plan Note (Signed)
His EKG shows persistent bigeminy, it appears unchanged from his prior EKG's

## 2012-06-05 NOTE — Assessment & Plan Note (Signed)
I have asked him to see cardiology again since he had a syncopal episode

## 2012-06-05 NOTE — Progress Notes (Signed)
Subjective:    Patient ID: Russell [name], male    DOB: Jan 04, 1924, 79 y.o.   MRN: [id number]  Loss of Consciousness This is a new problem. Episode onset: he passed out in his kitchen 3 days ago, he fellt weak and diizy and went down briefly. The problem occurs rarely. He lost consciousness for a period of less than 1 minute. The symptoms are aggravated by normal activity. Associated symptoms include dizziness, light-headedness and malaise/fatigue. Pertinent negatives include no abdominal pain, auditory change, aura, back pain, bladder incontinence, bowel incontinence, chest pain, clumsiness, confusion, diaphoresis, fever, focal sensory loss, focal weakness, headaches, nausea, palpitations, slurred speech, vertigo, visual change, vomiting or weakness. He has tried nothing for the symptoms. His past medical history is significant for arrhythmia.      Review of Systems  Constitutional: Positive for malaise/fatigue. Negative for fever and diaphoresis.  HENT: Negative.   Eyes: Negative.   Respiratory: Negative for apnea, cough, choking, chest tightness, shortness of breath, wheezing and stridor.   Cardiovascular: Positive for syncope. Negative for chest pain and palpitations.  Gastrointestinal: Negative for nausea, vomiting, abdominal pain and bowel incontinence.  Genitourinary: Negative for bladder incontinence.  Musculoskeletal: Negative for myalgias, back pain, joint swelling, arthralgias and gait problem.  Skin: Negative for color change, pallor, rash and wound.  Neurological: Positive for dizziness, syncope and light-headedness. Negative for vertigo, tremors, focal weakness, seizures, facial asymmetry, speech difficulty, weakness, numbness and headaches.  Hematological: Negative for adenopathy. Does not bruise/bleed easily.  Psychiatric/Behavioral: Positive for dysphoric mood. Negative for suicidal ideas, hallucinations, behavioral problems, confusion, sleep disturbance, self-injury, decreased  concentration and agitation. The patient is not nervous/anxious and is not hyperactive.        Objective:   Physical Exam  Vitals reviewed. Constitutional: He is oriented to person, place, and time. He appears well-developed and well-nourished. No distress.  HENT:  Head: Normocephalic and atraumatic.  Mouth/Throat: Oropharynx is clear and moist. No oropharyngeal exudate.  Eyes: Conjunctivae normal and EOM are normal. Pupils are equal, round, and reactive to light. Right eye exhibits no discharge. Left eye exhibits no discharge. No scleral icterus.  Neck: Normal range of motion. Neck supple. No JVD present. No tracheal deviation present. No thyromegaly present.  Cardiovascular: Normal rate, regular rhythm, normal heart sounds and intact distal pulses.  Exam reveals no gallop and no friction rub.   No murmur heard. Pulmonary/Chest: Effort normal and breath sounds normal. No stridor. No respiratory distress. He has no wheezes. He has no rales. He exhibits no tenderness.  Abdominal: Soft. Bowel sounds are normal. He exhibits no distension and no mass. There is no tenderness. There is no rebound and no guarding.  Musculoskeletal: Normal range of motion. He exhibits no edema and no tenderness.  Lymphadenopathy:    He has no cervical adenopathy.  Neurological: He is oriented to person, place, and time.  Skin: Skin is warm and dry. No rash noted. He is not diaphoretic. No erythema. No pallor.  Psychiatric: He has a normal mood and affect. His behavior is normal. Judgment and thought content normal.      Lab Results  Component Value Date   WBC 4.9 01/21/2012   HGB 12.9* 01/21/2012   HCT 39.3 01/21/2012   PLT 183.0 01/21/2012   GLUCOSE 91 01/21/2012   CHOL 126 08/06/2011   TRIG 48.0 08/06/2011   HDL 48.30 08/06/2011   LDLCALC 68 08/06/2011   ALT 28 01/21/2012   AST 38* 01/21/2012   NA 143 01/21/2012   K  4.6 01/21/2012   CL 105 01/21/2012   CREATININE 1.2 01/21/2012   BUN 30* 01/21/2012   CO2 24  01/21/2012   TSH 3.25 01/21/2012   PSA 1.35 01/21/2012   HGBA1C 5.6 01/21/2012   MICROALBUR 1.6 09/07/2009      Assessment & Plan:

## 2012-06-05 NOTE — Assessment & Plan Note (Signed)
I will check his A1C and will address if needed 

## 2012-06-05 NOTE — Assessment & Plan Note (Signed)
I am concerned that this may be related to his bradycardia and ventricular ectopy so I have asked him to see Dr. Graciela Husbands again

## 2012-06-05 NOTE — Patient Instructions (Signed)
Syncope Syncope is a fainting spell. This means the person loses consciousness and drops to the ground. The person is generally unconscious for less than 5 minutes. The person may have some muscle twitches for up to 15 seconds before waking up and returning to normal. Syncope occurs more often in elderly people, but it can happen to anyone. While most causes of syncope are not dangerous, syncope can be a sign of a serious medical problem. It is important to seek medical care.  CAUSES  Syncope is caused by a sudden decrease in blood flow to the brain. The specific cause is often not determined. Factors that can trigger syncope include:  Taking medicines that lower blood pressure.  Sudden changes in posture, such as standing up suddenly.  Taking more medicine than prescribed.  Standing in one place for too long.  Seizure disorders.  Dehydration and excessive exposure to heat.  Low blood sugar (hypoglycemia).  Straining to have a bowel movement.  Heart disease, irregular heartbeat, or other circulatory problems.  Fear, emotional distress, seeing blood, or severe pain. SYMPTOMS  Right before fainting, you may:  Feel dizzy or lightheaded.  Feel nauseous.  See all white or all black in your field of vision.  Have cold, clammy skin. DIAGNOSIS  Your caregiver will ask about your symptoms, perform a physical exam, and perform electrocardiography (ECG) to record the electrical activity of your heart. Your caregiver may also perform other heart or blood tests to determine the cause of your syncope. TREATMENT  In most cases, no treatment is needed. Depending on the cause of your syncope, your caregiver may recommend changing or stopping some of your medicines. HOME CARE INSTRUCTIONS  Have someone stay with you until you feel stable.  Do not drive, operate machinery, or play sports until your caregiver says it is okay.  Keep all follow-up appointments as directed by your  caregiver.  Lie down right away if you start feeling like you might faint. Breathe deeply and steadily. Wait until all the symptoms have passed.  Drink enough fluids to keep your urine clear or pale yellow.  If you are taking blood pressure or heart medicine, get up slowly, taking several minutes to sit and then stand. This can reduce dizziness. SEEK IMMEDIATE MEDICAL CARE IF:   You have a severe headache.  You have unusual pain in the chest, abdomen, or back.  You are bleeding from the mouth or rectum, or you have black or tarry stool.  You have an irregular or very fast heartbeat.  You have pain with breathing.  You have repeated fainting or seizure-like jerking during an episode.  You faint when sitting or lying down.  You have confusion.  You have difficulty walking.  You have severe weakness.  You have vision problems. If you fainted, call your local emergency services (911 in [place] Do not drive yourself to the hospital.  MAKE SURE YOU:  Understand these instructions.  Will watch your condition.  Will get help right away if you are not doing well or get worse. Document Released: 04/16/2005 Document Revised: 10/16/2011 Document Reviewed: 06/15/2011 Exit[employer] Patient Information 2013 [employer].  

## 2012-06-10 ENCOUNTER — Telehealth: Payer: Self-pay | Admitting: Internal Medicine

## 2012-06-10 NOTE — Telephone Encounter (Signed)
New Problem:    Patient's daughter called in concerned because Dr. Sanda Linger upon their last visit suggested that the patient have a ICD Implant to control his fluctuating HR.  Patient's daughter is concerned and would like to consult with you.  Please call back.

## 2012-06-10 NOTE — Telephone Encounter (Signed)
I spoke with the patient's daughter. Dr. Yetta Barre has referred him back to Dr. Graciela Husbands for syncope. In speaking with the patient's daughter, the patient did not pass completely out. He became lightheaded after standing. EKG reviewed- looks with NSR with PVC's. She is wanting to know if Dr. Graciela Husbands truly feels the patient needs a PPM based on his previous work up. I explained what happened may have been blood pressure related, however, I am not certain. There is a certain degree of worry among the family as the daughter states the patient's wife had a PPM as well and ended up with another procedure to try to slow down her fast heart rate, but died the night of that procedure. I explained I will ask Dr. Graciela Husbands to review Dr. Yetta Barre' office note and EKG and get his opinion, best option will most likely still be for an office visit. I will call the daughter back after reviewing with Dr. Graciela Husbands. She is agreeable.

## 2012-06-13 ENCOUNTER — Institutional Professional Consult (permissible substitution): Payer: Self-pay | Admitting: Cardiovascular Disease

## 2012-06-18 ENCOUNTER — Ambulatory Visit: Payer: Self-pay | Admitting: Internal Medicine

## 2012-06-19 ENCOUNTER — Telehealth: Payer: Self-pay | Admitting: Internal Medicine

## 2012-06-19 NOTE — Telephone Encounter (Signed)
Patients POA is requesting a refill for the patient for his Hydrocodone, she states the pharmacy had requested this earlier in the week and still have not heard

## 2012-06-19 NOTE — Telephone Encounter (Signed)
Ok for refill? 

## 2012-06-20 ENCOUNTER — Other Ambulatory Visit: Payer: Self-pay | Admitting: *Deleted

## 2012-06-20 MED ORDER — HYDROCODONE-ACETAMINOPHEN 5-500 MG PO TABS: 1.0000 | ORAL_TABLET | Freq: Four times a day (QID) | ORAL | Status: DC | PRN

## 2012-06-20 NOTE — Telephone Encounter (Signed)
Will probably need to change to 5-325.

## 2012-06-20 NOTE — Telephone Encounter (Signed)
Ok to set up appt  Thanks

## 2012-06-20 NOTE — Telephone Encounter (Signed)
rx called into [facility]; [phone]. Reading

## 2012-06-25 ENCOUNTER — Telehealth: Payer: Self-pay | Admitting: Internal Medicine

## 2012-06-25 MED ORDER — HYDROCODONE-ACETAMINOPHEN 5-325 MG PO TABS: 1.0000 | ORAL_TABLET | Freq: Four times a day (QID) | ORAL | Status: DC | PRN

## 2012-06-25 MED ORDER — HYDROCODONE-ACETAMINOPHEN 5-500 MG PO TABS: 1.0000 | ORAL_TABLET | Freq: Four times a day (QID) | ORAL | Status: DC | PRN

## 2012-06-25 NOTE — Telephone Encounter (Signed)
06-24-12 @457pm  called pt to rs cxl appt from 3-19 for syncope per [facility] staff message, pt asked me to call dtr, talked with brenda herbin who states her dad didn't want to come, I told her I had just talked to him and he asked me to call her, she said she would call and talk with him and call me back if he wants to rs appt/mt

## 2012-06-25 NOTE — Addendum Note (Signed)
Addended by: Adline Mango B on: 06/25/2012 01:14 PM   Modules accepted: Orders, Medications

## 2012-07-04 ENCOUNTER — Ambulatory Visit: Payer: Self-pay | Admitting: Internal Medicine

## 2012-07-08 ENCOUNTER — Other Ambulatory Visit: Payer: Self-pay | Admitting: *Deleted

## 2012-07-08 ENCOUNTER — Other Ambulatory Visit: Payer: Self-pay | Admitting: Internal Medicine

## 2012-07-08 MED ORDER — HYDROCHLOROTHIAZIDE 25 MG PO TABS: 25.0000 mg | ORAL_TABLET | Freq: Every day | ORAL | Status: DC

## 2012-07-21 ENCOUNTER — Ambulatory Visit (INDEPENDENT_AMBULATORY_CARE_PROVIDER_SITE_OTHER): Payer: Medicare Other | Admitting: Internal Medicine

## 2012-07-21 ENCOUNTER — Encounter: Payer: Self-pay | Admitting: Internal Medicine

## 2012-07-21 VITALS — BP 180/78 | HR 68 | Wt 170.0 lb

## 2012-07-21 DIAGNOSIS — I493 Ventricular premature depolarization: Secondary | ICD-10-CM

## 2012-07-21 DIAGNOSIS — I498 Other specified cardiac arrhythmias: Secondary | ICD-10-CM

## 2012-07-21 DIAGNOSIS — R55 Syncope and collapse: Secondary | ICD-10-CM

## 2012-07-21 DIAGNOSIS — R001 Bradycardia, unspecified: Secondary | ICD-10-CM

## 2012-07-21 DIAGNOSIS — I4949 Other premature depolarization: Secondary | ICD-10-CM

## 2012-07-21 NOTE — Assessment & Plan Note (Signed)
As above.

## 2012-07-21 NOTE — Assessment & Plan Note (Signed)
The patient continues with bigeminy. Surprisingly his beats  are palpable.  I'm not sure that his ectopy contributes to his recent malaise or syncopal episode. The event that he has recurrent syncope we could consider loop recorder

## 2012-07-21 NOTE — Assessment & Plan Note (Signed)
Functional 

## 2012-07-21 NOTE — Patient Instructions (Signed)
Your physician wants you to follow-up in: 6 months with Dr. Klein. You will receive a reminder letter in the mail two months in advance. If you don't receive a letter, please call our office to schedule the follow-up appointment.  

## 2012-07-21 NOTE — Progress Notes (Signed)
Patient Care Team: Russell Grandchild, MD as PCP - General (Internal Medicine)   HPI  Russell [name] is a 79 y.o. male Seen in followup for PVCs with a high burden of 10-20%  There is no clear symptomatic correlation and so it is elected to not treat him  Echocardiogram 10/13 demonstrated EF of 50-55%  His daughter bring him in today because they're concerned that he is a little bit sluggish. One of his good friends died last week or 2. His heart rate has been recorded by the home health nurses slow.  About a month and a half ago he had an episode of syncope. They're not able to give me much history about that today  Past Medical History  Diagnosis Date  . Hyperlipidemia   . Hypertension   . Dementia   . Anxiety associated with depression   . Urolithiasis   . History of cystoscopy     uretheral stricutre, 2005- Dr.Humphreys  . FTT (failure to thrive)     admited 08/2008  . Osteoarthritis     hands  . Colon cancer     s/p resection 12/06  . Prostate cancer     s/p XRT  . Diabetes mellitus     diet mgt (11`/12)  . Loss of hearing     being fitted for hearing aids (11/12)    Past Surgical History  Procedure Laterality Date  . Hemicolectomy  12/06  . Transurethral resection of prostate    . Xrt      ERT for prostate ca  . Cataract extraction, bilateral      Current Outpatient Prescriptions  Medication Sig Dispense Refill  . ALPRAZolam (XANAX) 0.5 MG tablet TAKE ONE-HALF TO ONE TABLET BY MOUTH EVERY 6 HOURS AS NEEDED FOR ANXIETY  30 tablet  3  . aspirin 81 MG tablet Take 81 mg by mouth daily.        . B Complex Vitamins (VITAMIN-B COMPLEX PO) Take by mouth.        . hydrochlorothiazide (HYDRODIURIL) 25 MG tablet TAKE ONE TABLET BY MOUTH EVERY DAY IN THE MORNING  30 tablet  0  . HYDROcodone-acetaminophen (NORCO/VICODIN) 5-325 MG per tablet Take 1 tablet by mouth every 6 (six) hours as needed for pain.  30 tablet  0  . Multiple Vitamin (ONE-A-DAY MENS PO) Take 1 tablet by mouth  daily.       Marland Kitchen olmesartan (BENICAR) 40 MG tablet Take 1 tablet (40 mg total) by mouth daily.  70 tablet  0  . omeprazole (PRILOSEC OTC) 20 MG tablet Take 20 mg by mouth daily as needed.      Marland Kitchen oxybutynin (DITROPAN) 5 MG tablet Take 5 mg by mouth 2 (two) times daily.        . sertraline (ZOLOFT) 100 MG tablet Take 100 mg by mouth daily.       . simvastatin (ZOCOR) 80 MG tablet TAKE ONE-HALF TABLET BY MOUTH EVERY DAY AT BEDTIME  90 tablet  3   No current facility-administered medications for this visit.    No Known Allergies  Review of Systems negative except from HPI and PMH  Physical Exam BP 180/78  Pulse 68  Wt 170 lb (77.111 kg)  BMI 27.45 kg/m2 Well developed and well nourished in no acute distress HENT normal E scleral and icterus clear Neck Supple JVP flat; carotids brisk and full Clear to ausculation Ir egular rate and rhythm, no murmurs gallops or rub Soft with active bowel sounds  No clubbing cyanosis Trace Edema Alert and oriented, grossly normal motor and sensory function Skin Warm and Dry    Assessment and  Plan

## 2012-07-22 ENCOUNTER — Other Ambulatory Visit: Payer: Self-pay | Admitting: Internal Medicine

## 2012-07-23 ENCOUNTER — Telehealth: Payer: Self-pay | Admitting: Internal Medicine

## 2012-07-23 NOTE — Telephone Encounter (Signed)
Caller: Brenda/Child; Phone: [phone]; Reason for Call: Daughter is calling in to notify Dr.  Yetta Barre that patient has been having episodes of his heart rate decreasing to a low level.  Patient was seen at his cardiologist and was told that there was nothing that could be done and that the low heart rate would eventually cause other complications.  The daughter would like to know if Dr.  Yetta Barre would like patient to come in for routine testing.  She is also asking if patient is supposed to be on Cymbalta, Zoloft, and alprazolam for depression as patient has been struggling with depression and is no longer on Cymbalta.  Informed daughter that taking Cymbalta and Zoloft is contraindicated unless the benefits outweigh the risk of taking the two together.  Please contact daughter at [phone] or [phone].  If daughter is unable to be reached and patient answers please set up morning appointment if appointment is indicated and daughter will call back to confirm with exception of the 1st and the 4th.    OFFICE NOTE: PLEASE FOLLOW UP WITH DAUGHTER

## 2012-07-23 NOTE — Telephone Encounter (Signed)
He should not be on cymbalta Stay on zoloft and xanax Call his cardiologist about the heart rate

## 2012-07-23 NOTE — Telephone Encounter (Signed)
LMOVM advising per MD. 

## 2012-08-04 ENCOUNTER — Telehealth: Payer: Self-pay | Admitting: Internal Medicine

## 2012-08-04 NOTE — Telephone Encounter (Signed)
I spoke with the patient's son-in-law and made him aware I would ask Dr. Graciela Husbands if there is anybody he would recommend for a second opinion at [facility]. He is aware that Dr. Graciela Husbands is out all week and it will be the beginning to middle of next week before I have a name. He is agreeable.

## 2012-08-04 NOTE — Telephone Encounter (Signed)
New Problem:    Patient's son-in-law called in because the patient was diagnosed with a low heart rate by Dr. Graciela Husbands and he wanted a referral for his father-in-law to be seen by a cardiologist at [facility] for a second opinion.  Please call back.

## 2012-08-13 ENCOUNTER — Telehealth: Payer: Self-pay | Admitting: Internal Medicine

## 2012-08-13 ENCOUNTER — Other Ambulatory Visit: Payer: Self-pay

## 2012-08-13 DIAGNOSIS — R001 Bradycardia, unspecified: Secondary | ICD-10-CM

## 2012-08-13 MED ORDER — HYDROCHLOROTHIAZIDE 25 MG PO TABS: ORAL_TABLET | ORAL | Status: DC

## 2012-08-13 NOTE — Telephone Encounter (Signed)
Noted and referral made.

## 2012-08-13 NOTE — Telephone Encounter (Signed)
New Problem:    Patient's daughter because his home health nurse recommended the patient see Dr. Hurman Horn.  Please call back.

## 2012-08-13 NOTE — Telephone Encounter (Signed)
Discussed with dr Graciela Husbands, pt will be referred to dr Dory Peru at [facility] for a second option for bradycardia. Family made aware.

## 2012-08-20 ENCOUNTER — Telehealth: Payer: Self-pay | Admitting: Internal Medicine

## 2012-08-20 ENCOUNTER — Encounter: Payer: Self-pay | Admitting: Internal Medicine

## 2012-08-20 ENCOUNTER — Other Ambulatory Visit (INDEPENDENT_AMBULATORY_CARE_PROVIDER_SITE_OTHER): Payer: Medicare Other

## 2012-08-20 ENCOUNTER — Ambulatory Visit (INDEPENDENT_AMBULATORY_CARE_PROVIDER_SITE_OTHER): Payer: Medicare Other | Admitting: Internal Medicine

## 2012-08-20 ENCOUNTER — Other Ambulatory Visit: Payer: Self-pay | Admitting: Internal Medicine

## 2012-08-20 VITALS — BP 122/64 | HR 56 | Temp 97.8°F | Resp 16 | Wt 162.0 lb

## 2012-08-20 DIAGNOSIS — I498 Other specified cardiac arrhythmias: Secondary | ICD-10-CM

## 2012-08-20 DIAGNOSIS — I10 Essential (primary) hypertension: Secondary | ICD-10-CM

## 2012-08-20 DIAGNOSIS — R001 Bradycardia, unspecified: Secondary | ICD-10-CM

## 2012-08-20 DIAGNOSIS — C61 Malignant neoplasm of prostate: Secondary | ICD-10-CM

## 2012-08-20 DIAGNOSIS — C189 Malignant neoplasm of colon, unspecified: Secondary | ICD-10-CM

## 2012-08-20 DIAGNOSIS — E785 Hyperlipidemia, unspecified: Secondary | ICD-10-CM

## 2012-08-20 DIAGNOSIS — F068 Other specified mental disorders due to known physiological condition: Secondary | ICD-10-CM

## 2012-08-20 DIAGNOSIS — F332 Major depressive disorder, recurrent severe without psychotic features: Secondary | ICD-10-CM

## 2012-08-20 LAB — COMPREHENSIVE METABOLIC PANEL
AST: 35 U/L (ref 0–37)
Albumin: 3.8 g/dL (ref 3.5–5.2)
Alkaline Phosphatase: 104 U/L (ref 39–117)
Potassium: 4.4 mEq/L (ref 3.5–5.1)
Sodium: 141 mEq/L (ref 135–145)
Total Bilirubin: 0.8 mg/dL (ref 0.3–1.2)
Total Protein: 6.8 g/dL (ref 6.0–8.3)

## 2012-08-20 LAB — LIPID PANEL
LDL Cholesterol: 85 mg/dL (ref 0–99)
Total CHOL/HDL Ratio: 3
Triglycerides: 50 mg/dL (ref 0.0–149.0)
VLDL: 10 mg/dL (ref 0.0–40.0)

## 2012-08-20 MED ORDER — OLMESARTAN MEDOXOMIL 40 MG PO TABS: 40.0000 mg | ORAL_TABLET | Freq: Every day | ORAL | Status: DC

## 2012-08-20 NOTE — Patient Instructions (Signed)

## 2012-08-20 NOTE — Telephone Encounter (Signed)
Samples up front pt notified

## 2012-08-20 NOTE — Telephone Encounter (Signed)
Pt needs samples of Benecar 40 mg

## 2012-08-20 NOTE — Assessment & Plan Note (Signed)
The daughters were counseled today on managing his behavior

## 2012-08-20 NOTE — Assessment & Plan Note (Signed)
This has improved with sertraline

## 2012-08-20 NOTE — Assessment & Plan Note (Signed)
This is stable He has not had anymore episodes of syncope

## 2012-08-20 NOTE — Assessment & Plan Note (Signed)
He is doing well on simvastatin FLP CMP TSH today

## 2012-08-20 NOTE — Assessment & Plan Note (Signed)
His BP is well controlled Today I will check his lytes and renal function 

## 2012-08-20 NOTE — Progress Notes (Signed)
Subjective:    Patient ID: Russell [name], male    DOB: 10-Oct-1923, 79 y.o.   MRN: [id number]  Hyperlipidemia This is a chronic problem. The current episode started more than 1 year ago. The problem is controlled. Recent lipid tests were reviewed and are variable. Exacerbating diseases include diabetes. He has no history of chronic renal disease, hypothyroidism, liver disease, obesity or nephrotic syndrome. There are no known factors aggravating his hyperlipidemia. Pertinent negatives include no chest pain, focal sensory loss, focal weakness, leg pain, myalgias or shortness of breath. Current antihyperlipidemic treatment includes statins. The current treatment provides significant improvement of lipids. There are no compliance problems.       Review of Systems  Constitutional: Negative.  Negative for fever, chills, diaphoresis, activity change, appetite change, fatigue and unexpected weight change.  HENT: Negative.   Eyes: Negative.   Respiratory: Negative.  Negative for cough, chest tightness, shortness of breath, wheezing and stridor.   Cardiovascular: Negative.  Negative for chest pain, palpitations and leg swelling.  Gastrointestinal: Negative.  Negative for nausea, vomiting, abdominal pain, diarrhea, constipation and blood in stool.  Endocrine: Negative.   Genitourinary: Negative.   Musculoskeletal: Positive for arthralgias. Negative for myalgias, back pain, joint swelling and gait problem.  Skin: Negative for color change, pallor, rash and wound.  Neurological: Negative.  Negative for dizziness, tremors, focal weakness, speech difficulty, weakness and light-headedness.  Hematological: Negative.  Negative for adenopathy. Does not bruise/bleed easily.  Psychiatric/Behavioral: Positive for confusion, dysphoric mood, decreased concentration and agitation. Negative for suicidal ideas, hallucinations, behavioral problems, sleep disturbance and self-injury. The patient is nervous/anxious. The  patient is not hyperactive.        Objective:   Physical Exam  Vitals reviewed. Constitutional: He is oriented to person, place, and time. He appears well-developed and well-nourished. No distress.  HENT:  Head: Normocephalic and atraumatic.  Mouth/Throat: Oropharynx is clear and moist. No oropharyngeal exudate.  Eyes: Conjunctivae are normal. Right eye exhibits no discharge. Left eye exhibits no discharge. No scleral icterus.  Neck: Normal range of motion. Neck supple. No JVD present. No tracheal deviation present. No thyromegaly present.  Cardiovascular: Normal rate, regular rhythm, normal heart sounds and intact distal pulses.  Exam reveals no gallop and no friction rub.   No murmur heard. Pulmonary/Chest: Effort normal and breath sounds normal. No stridor. No respiratory distress. He has no wheezes. He has no rales. He exhibits no tenderness.  Abdominal: Soft. Bowel sounds are normal. He exhibits no distension and no mass. There is no tenderness. There is no rebound and no guarding.  Musculoskeletal: Normal range of motion. He exhibits edema (1+ pitting edema in BLE). He exhibits no tenderness.  Lymphadenopathy:    He has no cervical adenopathy.  Neurological: He is oriented to person, place, and time.  Skin: Skin is warm and dry. No rash noted. He is not diaphoretic. No erythema. No pallor.  Psychiatric: He has a normal mood and affect. His behavior is normal. Judgment and thought content normal.     Lab Results  Component Value Date   WBC 5.1 06/05/2012   HGB 14.5 06/05/2012   HCT 43.2 06/05/2012   PLT 192.0 06/05/2012   GLUCOSE 84 06/05/2012   CHOL 126 08/06/2011   TRIG 48.0 08/06/2011   HDL 48.30 08/06/2011   LDLCALC 68 08/06/2011   ALT 18 06/05/2012   AST 26 06/05/2012   NA 141 06/05/2012   K 4.1 06/05/2012   CL 105 06/05/2012   CREATININE 1.4 06/05/2012  BUN 31* 06/05/2012   CO2 28 06/05/2012   TSH 3.25 01/21/2012   PSA 1.35 01/21/2012   HGBA1C 6.1 06/05/2012   MICROALBUR 1.6 09/07/2009        Assessment & Plan:

## 2012-09-24 ENCOUNTER — Telehealth: Payer: Self-pay | Admitting: Internal Medicine

## 2012-09-24 NOTE — Telephone Encounter (Signed)
Pt req sample for Benicar 40 mg. Please call pt back if we have any sample, Ok to leave detail masg with Trey Paula if pt or the daughter does not answer.

## 2012-09-26 NOTE — Telephone Encounter (Signed)
Daughter notified to pick up

## 2012-10-10 ENCOUNTER — Other Ambulatory Visit: Payer: Self-pay | Admitting: Internal Medicine

## 2012-10-23 ENCOUNTER — Telehealth: Payer: Self-pay | Admitting: Internal Medicine

## 2012-10-23 NOTE — Telephone Encounter (Signed)
Pt's daughter dropped off the request for diabetic shoes a few weeks ago.  [facility] (fax [phone]) Stanton Kidney is the person at [employer].  Please let them know if these shoe have been approved.

## 2012-10-28 NOTE — Telephone Encounter (Signed)
Please advise if you recall seeing this. Thanks

## 2012-10-28 NOTE — Telephone Encounter (Signed)
Yes, it was completed

## 2012-10-30 NOTE — Telephone Encounter (Signed)
LMOVM

## 2012-11-03 ENCOUNTER — Telehealth: Payer: Self-pay | Admitting: Internal Medicine

## 2012-11-03 NOTE — Telephone Encounter (Signed)
Patient needs a new machine, needles and lancets.  The one he has isn't working well and he is out of supplies.  Order through [facility] on [address]. Has the form been filled out for the diabetic shoes?  She brought it in a month ago. He is out of the hydrocodone also.  Needs a refill.

## 2012-11-03 NOTE — Telephone Encounter (Signed)
Steward Drone called back.  She wants to know if he can get the Accu-Chek Aviva+.  She has that one and can help him use it. He checks 3 times a day.

## 2012-11-04 ENCOUNTER — Telehealth: Payer: Self-pay | Admitting: *Deleted

## 2012-11-04 MED ORDER — ACCU-CHEK AVIVA PLUS W/DEVICE KIT: 1.0000 | PACK | Freq: Three times a day (TID) | Status: DC

## 2012-11-04 MED ORDER — HYDROCODONE-ACETAMINOPHEN 5-325 MG PO TABS: 1.0000 | ORAL_TABLET | Freq: Four times a day (QID) | ORAL | Status: DC | PRN

## 2012-11-04 MED ORDER — GLUCOSE BLOOD VI STRP: ORAL_STRIP | Status: DC

## 2012-11-04 NOTE — Telephone Encounter (Signed)
Please advise on hydrocodone refill. Thanks

## 2012-11-04 NOTE — Telephone Encounter (Signed)
Pt called requesting a refill on his Hydrocodone Rx.  Please advise

## 2012-11-04 NOTE — Telephone Encounter (Signed)
Steward Drone called reports that Russell [name] picked his medication up from the pharmacy but left it in the cart.  The medication was not turned into the pharmacy.  She is requesting assistance for her father.  Please advise.

## 2012-11-04 NOTE — Telephone Encounter (Signed)
Per 6/26/14phone note, this form was completed per MD. Rx for Accu chek sent to pharmacy per daughter request.

## 2012-11-04 NOTE — Telephone Encounter (Signed)
yes

## 2012-11-05 ENCOUNTER — Telehealth: Payer: Self-pay | Admitting: *Deleted

## 2012-11-05 NOTE — Telephone Encounter (Signed)
Spoke with daughter and pharmacy.  Action complete

## 2012-11-05 NOTE — Telephone Encounter (Signed)
Called in HCTZ, Accu Chek Kit, and Alprazolam to [facility] due to pt loosing Rx on 7.8.2014.

## 2012-11-10 ENCOUNTER — Other Ambulatory Visit: Payer: Self-pay

## 2012-11-10 MED ORDER — ACCU-CHEK SOFT TOUCH LANCETS MISC: Status: DC

## 2012-11-10 MED ORDER — GLUCOSE BLOOD VI STRP: ORAL_STRIP | Status: DC

## 2012-12-03 ENCOUNTER — Other Ambulatory Visit: Payer: Self-pay | Admitting: Internal Medicine

## 2013-01-08 ENCOUNTER — Telehealth: Payer: Self-pay | Admitting: *Deleted

## 2013-01-08 DIAGNOSIS — I10 Essential (primary) hypertension: Secondary | ICD-10-CM

## 2013-01-08 MED ORDER — OLMESARTAN MEDOXOMIL 40 MG PO TABS: 40.0000 mg | ORAL_TABLET | Freq: Every day | ORAL | Status: DC

## 2013-01-08 NOTE — Telephone Encounter (Signed)
Pt aware samples ready for pick up 

## 2013-01-29 ENCOUNTER — Telehealth: Payer: Self-pay | Admitting: *Deleted

## 2013-01-29 NOTE — Telephone Encounter (Signed)
Steward Drone pts daughter called requesting Benicar 40mg  samples.  Please advise

## 2013-01-29 NOTE — Telephone Encounter (Signed)
Spoke with Steward Drone advised samples ready for pickup

## 2013-01-29 NOTE — Telephone Encounter (Signed)
ok 

## 2013-02-04 ENCOUNTER — Other Ambulatory Visit: Payer: Self-pay | Admitting: Internal Medicine

## 2013-02-20 ENCOUNTER — Encounter: Payer: Self-pay | Admitting: Internal Medicine

## 2013-02-20 ENCOUNTER — Ambulatory Visit (INDEPENDENT_AMBULATORY_CARE_PROVIDER_SITE_OTHER): Payer: Medicare Other

## 2013-02-20 ENCOUNTER — Ambulatory Visit (INDEPENDENT_AMBULATORY_CARE_PROVIDER_SITE_OTHER): Payer: Medicare Other | Admitting: Internal Medicine

## 2013-02-20 VITALS — BP 110/70 | HR 68 | Temp 97.6°F | Resp 16 | Ht 66.0 in | Wt 169.2 lb

## 2013-02-20 DIAGNOSIS — L602 Onychogryphosis: Secondary | ICD-10-CM | POA: Insufficient documentation

## 2013-02-20 DIAGNOSIS — F332 Major depressive disorder, recurrent severe without psychotic features: Secondary | ICD-10-CM

## 2013-02-20 DIAGNOSIS — C189 Malignant neoplasm of colon, unspecified: Secondary | ICD-10-CM

## 2013-02-20 DIAGNOSIS — IMO0001 Reserved for inherently not codable concepts without codable children: Secondary | ICD-10-CM

## 2013-02-20 DIAGNOSIS — L608 Other nail disorders: Secondary | ICD-10-CM

## 2013-02-20 DIAGNOSIS — C61 Malignant neoplasm of prostate: Secondary | ICD-10-CM

## 2013-02-20 DIAGNOSIS — M1990 Unspecified osteoarthritis, unspecified site: Secondary | ICD-10-CM

## 2013-02-20 DIAGNOSIS — I10 Essential (primary) hypertension: Secondary | ICD-10-CM

## 2013-02-20 DIAGNOSIS — Z23 Encounter for immunization: Secondary | ICD-10-CM

## 2013-02-20 LAB — COMPREHENSIVE METABOLIC PANEL
ALT: 17 U/L (ref 0–53)
AST: 25 U/L (ref 0–37)
Calcium: 9.2 mg/dL (ref 8.4–10.5)
Chloride: 106 mEq/L (ref 96–112)
Creatinine, Ser: 1.2 mg/dL (ref 0.4–1.5)
Potassium: 4.3 mEq/L (ref 3.5–5.1)

## 2013-02-20 LAB — URINALYSIS, ROUTINE W REFLEX MICROSCOPIC
Bilirubin Urine: NEGATIVE
Ketones, ur: NEGATIVE
Urine Glucose: NEGATIVE
Urobilinogen, UA: 1 (ref 0.0–1.0)

## 2013-02-20 LAB — CBC WITH DIFFERENTIAL/PLATELET
Basophils Absolute: 0 10*3/uL (ref 0.0–0.1)
Eosinophils Absolute: 0.3 10*3/uL (ref 0.0–0.7)
Hemoglobin: 14.1 g/dL (ref 13.0–17.0)
Lymphocytes Relative: 17.4 % (ref 12.0–46.0)
Lymphs Abs: 0.7 10*3/uL (ref 0.7–4.0)
MCHC: 33.4 g/dL (ref 30.0–36.0)
MCV: 88.5 fl (ref 78.0–100.0)
Monocytes Absolute: 0.4 10*3/uL (ref 0.1–1.0)
Neutro Abs: 2.4 10*3/uL (ref 1.4–7.7)
RDW: 15.1 % — ABNORMAL HIGH (ref 11.5–14.6)

## 2013-02-20 LAB — PSA: PSA: 1.94 ng/mL (ref 0.10–4.00)

## 2013-02-20 MED ORDER — HYDROCODONE-ACETAMINOPHEN 5-325 MG PO TABS: 1.0000 | ORAL_TABLET | Freq: Four times a day (QID) | ORAL | Status: DC | PRN

## 2013-02-20 MED ORDER — SERTRALINE HCL 100 MG PO TABS: 100.0000 mg | ORAL_TABLET | Freq: Every day | ORAL | Status: DC

## 2013-02-20 MED ORDER — OLMESARTAN MEDOXOMIL 40 MG PO TABS: 40.0000 mg | ORAL_TABLET | Freq: Every day | ORAL | Status: DC

## 2013-02-20 MED ORDER — ALPRAZOLAM 0.5 MG PO TABS: 0.5000 mg | ORAL_TABLET | Freq: Three times a day (TID) | ORAL | Status: DC | PRN

## 2013-02-20 NOTE — Patient Instructions (Signed)

## 2013-02-20 NOTE — Assessment & Plan Note (Signed)
I will recheck his PSA today 

## 2013-02-20 NOTE — Progress Notes (Signed)
Subjective:    Patient ID: Russell [name], male    DOB: 1923/11/06, 79 y.o.   MRN: [id number]  Arthritis Presents for follow-up visit. The disease course has been worsening. He complains of pain. He reports no stiffness, joint swelling or joint warmth. Affected locations include the left hip, left knee and right knee. His pain is at a severity of 3/10. Associated symptoms include pain at night and pain while resting. Pertinent negatives include no diarrhea, dry eyes, dry mouth, dysuria, fatigue, fever, rash, Raynaud's syndrome, uveitis or weight loss. His past medical history is significant for osteoarthritis. There is no history of rheumatoid arthritis.  Past treatments include an opioid and acetaminophen. The treatment provided significant relief. Factors aggravating his arthritis include activity. Compliance with prior treatments has been variable. Prior compliance problems include difficulty understanding directions.      Review of Systems  Constitutional: Negative.  Negative for fever, chills, weight loss, diaphoresis, appetite change and fatigue.  HENT: Negative.   Eyes: Negative.   Respiratory: Negative.  Negative for cough, chest tightness, shortness of breath, wheezing and stridor.   Cardiovascular: Negative.  Negative for chest pain, palpitations and leg swelling.  Gastrointestinal: Negative.  Negative for nausea, vomiting, abdominal pain, diarrhea, constipation, blood in stool and abdominal distention.  Endocrine: Negative.  Negative for polydipsia, polyphagia and polyuria.  Genitourinary: Negative.  Negative for dysuria.  Musculoskeletal: Positive for arthralgias, arthritis and back pain. Negative for gait problem, joint swelling, myalgias, neck pain, neck stiffness and stiffness.  Skin: Negative.  Negative for color change, pallor, rash and wound.  Allergic/Immunologic: Negative.   Neurological: Negative.  Negative for dizziness, tremors, weakness, light-headedness and numbness.   Hematological: Negative.  Negative for adenopathy. Does not bruise/bleed easily.  Psychiatric/Behavioral: Positive for sleep disturbance. Negative for suicidal ideas, hallucinations, behavioral problems, confusion, self-injury, dysphoric mood, decreased concentration and agitation. The patient is nervous/anxious. The patient is not hyperactive.        Objective:   Physical Exam  Vitals reviewed. Constitutional: He is oriented to person, place, and time. He appears well-developed and well-nourished. No distress.  HENT:  Head: Normocephalic and atraumatic.  Mouth/Throat: Oropharynx is clear and moist. No oropharyngeal exudate.  Eyes: Conjunctivae are normal. Right eye exhibits no discharge. Left eye exhibits no discharge. No scleral icterus.  Neck: Normal range of motion. Neck supple. No JVD present. No tracheal deviation present. No thyromegaly present.  Cardiovascular: Normal rate, regular rhythm, normal heart sounds and intact distal pulses.  Exam reveals no gallop and no friction rub.   No murmur heard. Pulmonary/Chest: Effort normal and breath sounds normal. No stridor. No respiratory distress. He has no wheezes. He has no rales. He exhibits no tenderness.  Abdominal: Soft. Bowel sounds are normal. He exhibits no distension and no mass. There is no tenderness. There is no rebound and no guarding.  Musculoskeletal: Normal range of motion. He exhibits edema (1+ pitting edema in BLE). He exhibits no tenderness.  Lymphadenopathy:    He has no cervical adenopathy.  Neurological: He is oriented to person, place, and time.  Skin: Skin is warm and dry. No rash noted. He is not diaphoretic. No erythema. No pallor.  Psychiatric: He has a normal mood and affect. His behavior is normal. Judgment and thought content normal.     Lab Results  Component Value Date   WBC 5.1 06/05/2012   HGB 14.5 06/05/2012   HCT 43.2 06/05/2012   PLT 192.0 06/05/2012   GLUCOSE 101* 08/20/2012   CHOL 146  08/20/2012    TRIG 50.0 08/20/2012   HDL 51.30 08/20/2012   LDLCALC 85 08/20/2012   ALT 24 08/20/2012   AST 35 08/20/2012   NA 141 08/20/2012   K 4.4 08/20/2012   CL 105 08/20/2012   CREATININE 1.2 08/20/2012   BUN 31* 08/20/2012   CO2 28 08/20/2012   TSH 2.69 08/20/2012   PSA 1.35 01/21/2012   HGBA1C 6.1 06/05/2012   MICROALBUR 1.6 09/07/2009       Assessment & Plan:

## 2013-02-20 NOTE — Assessment & Plan Note (Signed)
I will recheck his CEA today

## 2013-02-20 NOTE — Assessment & Plan Note (Signed)
His BP is well controlled Today I will check his lytes and renal function 

## 2013-02-20 NOTE — Assessment & Plan Note (Signed)
He will cont with the current meds for pain

## 2013-02-20 NOTE — Assessment & Plan Note (Signed)
I will check his A1C and will advise further 

## 2013-02-21 LAB — CEA: CEA: 0.5 ng/mL (ref 0.0–5.0)

## 2013-02-21 LAB — DRUGS OF ABUSE SCREEN W/O ALC, ROUTINE URINE
Amphetamine Screen, Ur: NEGATIVE
Benzodiazepines.: POSITIVE — AB
Cocaine Metabolites: NEGATIVE
Methadone: NEGATIVE

## 2013-02-26 ENCOUNTER — Other Ambulatory Visit: Payer: Self-pay | Admitting: Internal Medicine

## 2013-02-26 DIAGNOSIS — L602 Onychogryphosis: Secondary | ICD-10-CM

## 2013-02-26 LAB — BENZODIAZEPINES (GC/LC/MS), URINE
Alprazolam (GC/LC/MS), ur confirm: NEGATIVE ng/mL
Alprazolam metabolite (GC/LC/MS), ur confirm: 50 ng/mL
Diazepam (GC/LC/MS), ur confirm: NEGATIVE ng/mL
Lorazepam (GC/LC/MS), ur confirm: NEGATIVE ng/mL
Midazolam (GC/LC/MS), ur confirm: NEGATIVE ng/mL
Oxazepam (GC/LC/MS), ur confirm: NEGATIVE ng/mL
Temazepam (GC/LC/MS), ur confirm: NEGATIVE ng/mL
Triazolam metabolite (GC/LC/MS), ur confirm: NEGATIVE ng/mL

## 2013-03-19 ENCOUNTER — Other Ambulatory Visit: Payer: Self-pay | Admitting: *Deleted

## 2013-03-19 ENCOUNTER — Ambulatory Visit: Payer: Medicare Other | Admitting: Internal Medicine

## 2013-03-19 MED ORDER — SIMVASTATIN 80 MG PO TABS: ORAL_TABLET | ORAL | Status: DC

## 2013-03-25 ENCOUNTER — Other Ambulatory Visit: Payer: Self-pay | Admitting: *Deleted

## 2013-03-25 MED ORDER — SIMVASTATIN 80 MG PO TABS: ORAL_TABLET | ORAL | Status: DC

## 2013-04-01 ENCOUNTER — Ambulatory Visit: Payer: Self-pay | Admitting: Podiatrist

## 2013-04-02 ENCOUNTER — Telehealth: Payer: Self-pay | Admitting: Internal Medicine

## 2013-04-02 NOTE — Telephone Encounter (Signed)
Spoke with pts daughter, pharmacy does have refill

## 2013-04-02 NOTE — Telephone Encounter (Signed)
Pt request sample for benica. Pt also stated that [facility] does not have simvastatin that we sent on 03/25/13. Please advise.

## 2013-04-15 ENCOUNTER — Ambulatory Visit (INDEPENDENT_AMBULATORY_CARE_PROVIDER_SITE_OTHER): Payer: Medicare Other | Admitting: Podiatrist

## 2013-04-15 ENCOUNTER — Encounter: Payer: Self-pay | Admitting: Podiatrist

## 2013-04-15 VITALS — BP 135/92 | HR 75 | Resp 16 | Ht 64.0 in | Wt 160.0 lb

## 2013-04-15 DIAGNOSIS — M79609 Pain in unspecified limb: Secondary | ICD-10-CM

## 2013-04-15 DIAGNOSIS — B351 Tinea unguium: Secondary | ICD-10-CM

## 2013-04-15 NOTE — Progress Notes (Signed)
Subjective: Patient presents today for followup of foot and toenail care. He denies any change in medications or allergies. Chart is reviewed from previous visits.  Objective: Neurovascular status reveals palpable pedal pulses at 2+ out of 4 DP and PT bilateral. He does have increasing capillary refill time and decreased hair growth noted to the distal digits bilateral. He also has sensation decreased via Semmes Weinstein monofilament at 3/5 sites bilateral. Light touch is intact vibratory sensation is decreased bilateral. His skin is thick due to swelling. Moderate swelling is also present today. He also has a area of soft tissue lesion or cyst on the dorsal aspect of the left foot. It is not bothersome today. The patient also has significantly elongated, thickened, dystrophic, mycotic toenails x10. They're painful and symptomatic.  Assessment: Symptomatic onychomycosis  Plan:Discussed etiology, pathology, conservative vs. Surgical therapies and at this time debridement of symptomatic toenails was recommended.  Onychoreduction of symptomatic toenails was performed without iatrogenic incident.  Patient was instructed on signs and symptoms of infection and was told to call immediately should any of these arise.    Marlowe Aschoff DPM

## 2013-04-15 NOTE — Patient Instructions (Signed)
Diabetes and Foot Care Diabetes may cause you to have problems because of poor blood supply (circulation) to your feet and legs. This may cause the skin on your feet to become thinner, break easier, and heal more slowly. Your skin may become dry, and the skin may peel and crack. You may also have nerve damage in your legs and feet causing decreased feeling in them. You may not notice minor injuries to your feet that could lead to infections or more serious problems. Taking care of your feet is one of the most important things you can do for yourself.  HOME CARE INSTRUCTIONS  Wear shoes at all times, even in the house. Do not go barefoot. Bare feet are easily injured.  Check your feet daily for blisters, cuts, and redness. If you cannot see the bottom of your feet, use a mirror or ask someone for help.  Wash your feet with warm water (do not use hot water) and mild soap. Then pat your feet and the areas between your toes until they are completely dry. Do not soak your feet as this can dry your skin.  Apply a moisturizing lotion or petroleum jelly (that does not contain alcohol and is unscented) to the skin on your feet and to dry, brittle toenails. Do not apply lotion between your toes.  Trim your toenails straight across. Do not dig under them or around the cuticle. File the edges of your nails with an emery board or nail file.  Do not cut corns or calluses or try to remove them with medicine.  Wear clean socks or stockings every day. Make sure they are not too tight. Do not wear knee-high stockings since they may decrease blood flow to your legs.  Wear shoes that fit properly and have enough cushioning. To break in new shoes, wear them for just a few hours a day. This prevents you from injuring your feet. Always look in your shoes before you put them on to be sure there are no objects inside.  Do not cross your legs. This may decrease the blood flow to your feet.  If you find a minor scrape,  cut, or break in the skin on your feet, keep it and the skin around it clean and dry. These areas may be cleansed with mild soap and water. Do not cleanse the area with peroxide, alcohol, or iodine.  When you remove an adhesive bandage, be sure not to damage the skin around it.  If you have a wound, look at it several times a day to make sure it is healing.  Do not use heating pads or hot water bottles. They may burn your skin. If you have lost feeling in your feet or legs, you may not know it is happening until it is too late.  Make sure your health care provider performs a complete foot exam at least annually or more often if you have foot problems. Report any cuts, sores, or bruises to your health care provider immediately. SEEK MEDICAL CARE IF:   You have an injury that is not healing.  You have cuts or breaks in the skin.  You have an ingrown nail.  You notice redness on your legs or feet.  You feel burning or tingling in your legs or feet.  You have pain or cramps in your legs and feet.  Your legs or feet are numb.  Your feet always feel cold. SEEK IMMEDIATE MEDICAL CARE IF:   There is increasing redness,   swelling, or pain in or around a wound.  There is a red line that goes up your leg.  Pus is coming from a wound.  You develop a fever or as directed by your health care provider.  You notice a bad smell coming from an ulcer or wound. Document Released: 04/13/2000 Document Revised: 12/17/2012 Document Reviewed: 09/23/2012 Exit[employer] Patient Information 2014 [employer].  

## 2013-05-12 ENCOUNTER — Ambulatory Visit: Payer: Medicare Other | Admitting: Internal Medicine

## 2013-05-19 ENCOUNTER — Telehealth: Payer: Self-pay | Admitting: Internal Medicine

## 2013-05-19 NOTE — Telephone Encounter (Signed)
LMOVM advising to pick up.

## 2013-05-19 NOTE — Telephone Encounter (Signed)
Patients daughter called requesting sameples of olmesartan (BENICAR) 40 MG tablet  Please advise

## 2013-05-20 ENCOUNTER — Telehealth: Payer: Self-pay | Admitting: Internal Medicine

## 2013-05-20 MED ORDER — HYDROCODONE-ACETAMINOPHEN 5-325 MG PO TABS: 1.0000 | ORAL_TABLET | Freq: Four times a day (QID) | ORAL | Status: DC | PRN

## 2013-05-20 NOTE — Telephone Encounter (Signed)
Pt request refill for hydrocodone, pt was advise by drug store to contact PCP to get the written Rx. Please advise. Pt request to come pick this up tomorrow 1.22.15.

## 2013-05-20 NOTE — Telephone Encounter (Signed)
Ok to leave detail massage on [phone] if no answer.

## 2013-05-21 ENCOUNTER — Telehealth: Payer: Self-pay | Admitting: Internal Medicine

## 2013-05-21 DIAGNOSIS — R32 Unspecified urinary incontinence: Secondary | ICD-10-CM

## 2013-05-21 NOTE — Telephone Encounter (Signed)
See prior request, message already left on daughter VM to pick up.

## 2013-05-21 NOTE — Telephone Encounter (Signed)
Daughter is requesting a referral for [employer] to be sent to [facility] (DR. Scott MacDiarmid).  Patient has been seeing this doctor for awhile for bladder leakage.  Patient has an appointment this Monday 05/25/2013 at 9:20am.  [facility] has stated that they have to have referral before he can be seen.  He had an appt today with [facility] and they rescheduled him to Monday.

## 2013-06-23 ENCOUNTER — Ambulatory Visit: Payer: Self-pay | Admitting: Internal Medicine

## 2013-06-30 ENCOUNTER — Ambulatory Visit (INDEPENDENT_AMBULATORY_CARE_PROVIDER_SITE_OTHER): Payer: Medicare HMO | Admitting: Internal Medicine

## 2013-06-30 ENCOUNTER — Encounter: Payer: Self-pay | Admitting: Internal Medicine

## 2013-06-30 ENCOUNTER — Other Ambulatory Visit (INDEPENDENT_AMBULATORY_CARE_PROVIDER_SITE_OTHER): Payer: Medicare HMO

## 2013-06-30 VITALS — BP 108/62 | HR 55 | Temp 97.5°F | Resp 16 | Ht 66.0 in | Wt 162.0 lb

## 2013-06-30 DIAGNOSIS — I10 Essential (primary) hypertension: Secondary | ICD-10-CM

## 2013-06-30 DIAGNOSIS — M1990 Unspecified osteoarthritis, unspecified site: Secondary | ICD-10-CM

## 2013-06-30 DIAGNOSIS — R001 Bradycardia, unspecified: Secondary | ICD-10-CM

## 2013-06-30 DIAGNOSIS — I498 Other specified cardiac arrhythmias: Secondary | ICD-10-CM

## 2013-06-30 LAB — BASIC METABOLIC PANEL
BUN: 32 mg/dL — ABNORMAL HIGH (ref 6–23)
CHLORIDE: 104 meq/L (ref 96–112)
CO2: 28 mEq/L (ref 19–32)
CREATININE: 1.4 mg/dL (ref 0.4–1.5)
Calcium: 9 mg/dL (ref 8.4–10.5)
GFR: 61.03 mL/min (ref >60.00)
Glucose, Bld: 84 mg/dL (ref 70–99)
POTASSIUM: 3.7 meq/L (ref 3.5–5.1)
Sodium: 139 mEq/L (ref 135–145)

## 2013-06-30 MED ORDER — HYDROCODONE-ACETAMINOPHEN 5-325 MG PO TABS: 1.0000 | ORAL_TABLET | Freq: Four times a day (QID) | ORAL | Status: DC | PRN

## 2013-06-30 MED ORDER — HYDROCHLOROTHIAZIDE 25 MG PO TABS: ORAL_TABLET | ORAL | Status: DC

## 2013-06-30 MED ORDER — GLUCOSE BLOOD VI STRP: ORAL_STRIP | Status: DC

## 2013-06-30 MED ORDER — SIMVASTATIN 80 MG PO TABS: ORAL_TABLET | ORAL | Status: DC

## 2013-06-30 NOTE — Assessment & Plan Note (Signed)
His BP is well controlled 

## 2013-06-30 NOTE — Progress Notes (Signed)
Subjective:    Patient ID: Russell [name], male    DOB: 06-Jan-1924, 80 y.o.   MRN: [id number]  Hypertension This is a chronic problem. The current episode started more than 1 year ago. The problem has been gradually improving since onset. The problem is controlled. Associated symptoms include peripheral edema. Pertinent negatives include no anxiety, blurred vision, chest pain, headaches, malaise/fatigue, neck pain, orthopnea, palpitations, PND, shortness of breath or sweats. There are no associated agents to hypertension. Past treatments include angiotensin blockers and diuretics. The current treatment provides moderate improvement. There are no compliance problems.       Review of Systems  Constitutional: Negative.  Negative for fever, chills, malaise/fatigue, diaphoresis, appetite change and fatigue.  HENT: Negative.   Eyes: Negative.  Negative for blurred vision.  Respiratory: Negative.  Negative for choking, chest tightness, shortness of breath, wheezing and stridor.   Cardiovascular: Negative.  Negative for chest pain, palpitations, orthopnea, leg swelling and PND.  Gastrointestinal: Negative.   Endocrine: Negative.   Genitourinary: Negative.   Musculoskeletal: Positive for arthralgias. Negative for back pain and neck pain.  Skin: Negative.   Allergic/Immunologic: Negative.   Neurological: Negative.  Negative for dizziness, tremors, syncope, weakness, light-headedness and headaches.  Hematological: Negative.  Negative for adenopathy. Does not bruise/bleed easily.  Psychiatric/Behavioral: Negative for suicidal ideas, behavioral problems, confusion, sleep disturbance, dysphoric mood and agitation. The patient is nervous/anxious.        Objective:   Physical Exam  Vitals reviewed. Constitutional: He is oriented to person, place, and time. He appears well-developed and well-nourished. No distress.  HENT:  Head: Normocephalic and atraumatic.  Mouth/Throat: Oropharynx is clear and  moist. No oropharyngeal exudate.  Eyes: Conjunctivae are normal. Right eye exhibits no discharge. Left eye exhibits no discharge. No scleral icterus.  Neck: Normal range of motion. Neck supple. No JVD present. No tracheal deviation present. No thyromegaly present.  Cardiovascular: Regular rhythm, S1 normal, S2 normal, normal heart sounds and intact distal pulses.  Bradycardia present.  Exam reveals no gallop and no friction rub.   No murmur heard. Pulses:      Carotid pulses are 1+ on the right side, and 1+ on the left side.      Radial pulses are 1+ on the right side, and 1+ on the left side.       Femoral pulses are 1+ on the right side, and 1+ on the left side.      Popliteal pulses are 1+ on the right side, and 1+ on the left side.       Dorsalis pedis pulses are 1+ on the right side, and 1+ on the left side.       Posterior tibial pulses are 1+ on the right side, and 1+ on the left side.  Pulmonary/Chest: Effort normal and breath sounds normal. No stridor. No respiratory distress. He has no wheezes. He has no rales. He exhibits no tenderness.  Abdominal: Soft. Bowel sounds are normal. He exhibits no distension and no mass. There is no tenderness. There is no rebound and no guarding.  Musculoskeletal: Normal range of motion. He exhibits edema (1+ pitting edema in BLE). He exhibits no tenderness.  Lymphadenopathy:    He has no cervical adenopathy.  Neurological: He is oriented to person, place, and time.  Skin: Skin is warm and dry. No rash noted. He is not diaphoretic. No erythema. No pallor.  Psychiatric: He has a normal mood and affect. His behavior is normal. Judgment and thought content  normal.    Lab Results  Component Value Date   WBC 3.8* 02/20/2013   HGB 14.1 02/20/2013   HCT 42.1 02/20/2013   PLT 189.0 02/20/2013   GLUCOSE 87 02/20/2013   CHOL 146 08/20/2012   TRIG 50.0 08/20/2012   HDL 51.30 08/20/2012   LDLCALC 85 08/20/2012   ALT 17 02/20/2013   AST 25 02/20/2013   NA  142 02/20/2013   K 4.3 02/20/2013   CL 106 02/20/2013   CREATININE 1.2 02/20/2013   BUN 28* 02/20/2013   CO2 29 02/20/2013   TSH 2.69 08/20/2012   PSA 1.94 02/20/2013   HGBA1C 5.7 02/20/2013   MICROALBUR 1.6 09/07/2009        Assessment & Plan:

## 2013-06-30 NOTE — Progress Notes (Signed)
Pre visit review using our clinic review tool, if applicable. No additional management support is needed unless otherwise documented below in the visit note. 

## 2013-06-30 NOTE — Assessment & Plan Note (Signed)
He will cont the current meds for pain, he will submit a UDS to check for compliance

## 2013-06-30 NOTE — Assessment & Plan Note (Signed)
He has no s/s of this and is stable

## 2013-06-30 NOTE — Patient Instructions (Signed)

## 2013-07-24 ENCOUNTER — Encounter: Payer: Self-pay | Admitting: Internal Medicine

## 2013-08-18 ENCOUNTER — Telehealth: Payer: Self-pay | Admitting: Internal Medicine

## 2013-08-18 DIAGNOSIS — I10 Essential (primary) hypertension: Secondary | ICD-10-CM

## 2013-08-18 DIAGNOSIS — E1165 Type 2 diabetes mellitus with hyperglycemia: Secondary | ICD-10-CM

## 2013-08-18 DIAGNOSIS — IMO0001 Reserved for inherently not codable concepts without codable children: Secondary | ICD-10-CM

## 2013-08-18 MED ORDER — OLMESARTAN MEDOXOMIL 40 MG PO TABS: 40.0000 mg | ORAL_TABLET | Freq: Every day | ORAL | Status: DC

## 2013-08-18 NOTE — Telephone Encounter (Signed)
Pt daughter [name] Notified

## 2013-08-18 NOTE — Telephone Encounter (Signed)
Requesting samples of benicar 40 mg. They called last week.  He has been out all last week.

## 2013-08-25 ENCOUNTER — Other Ambulatory Visit: Payer: Self-pay | Admitting: Internal Medicine

## 2013-11-04 ENCOUNTER — Telehealth: Payer: Self-pay | Admitting: *Deleted

## 2013-11-04 DIAGNOSIS — L602 Onychogryphosis: Secondary | ICD-10-CM

## 2013-11-04 NOTE — Telephone Encounter (Signed)
Left msg on triage stating dad is needing some referrals. One to see foot doctor & neurologist. Called daughter back to get more info had to leave vm RTC....lmb

## 2013-11-05 NOTE — Telephone Encounter (Signed)
Called [name] back she stated since pt has humanna he will need referral to go to [place] with Dr. Trudie Buckler. Appt already made for 11/19/13, but they need referral. Do not need neurologist.../lmb

## 2013-11-19 ENCOUNTER — Telehealth: Payer: Self-pay | Admitting: *Deleted

## 2013-11-19 ENCOUNTER — Encounter: Payer: Self-pay | Admitting: Podiatrist

## 2013-11-19 ENCOUNTER — Ambulatory Visit (INDEPENDENT_AMBULATORY_CARE_PROVIDER_SITE_OTHER): Payer: Commercial Managed Care - HMO | Admitting: Podiatrist

## 2013-11-19 VITALS — BP 132/69 | HR 67 | Resp 12

## 2013-11-19 DIAGNOSIS — B351 Tinea unguium: Secondary | ICD-10-CM

## 2013-11-19 DIAGNOSIS — M79609 Pain in unspecified limb: Secondary | ICD-10-CM

## 2013-11-19 DIAGNOSIS — M79673 Pain in unspecified foot: Secondary | ICD-10-CM

## 2013-11-19 NOTE — Progress Notes (Signed)
Subjective: Patient presents today for followup of foot and toenail care. He denies any change in medications or allergies.    Objective: Neurovascular status reveals palpable pedal pulses at 2+ out of 4 DP and PT bilateral. He does have increasing capillary refill time and decreased hair growth noted to the distal digits bilateral. He also has sensation decreased via Semmes Weinstein monofilament at 3/5 sites bilateral. Light touch is intact vibratory sensation is decreased bilateral. His skin is thick due to swelling. Moderate swelling is also present today. He also has a area of soft tissue lesion or cyst on the dorsal aspect of the left foot. It is not bothersome today. The patient also has elongated, thickened, dystrophic, mycotic toenails x10. They're painful and symptomatic.   Assessment: Symptomatic onychomycosis   Plan:Discussed etiology, pathology, conservative vs. Surgical therapies and at this time debridement of symptomatic toenails was recommended. Onychoreduction of symptomatic toenails was performed without iatrogenic incident. Patient was instructed on signs and symptoms of infection and was told to call immediately should any of these arise.

## 2013-11-19 NOTE — Telephone Encounter (Signed)
Daughter left smg on triage requesting samples of benicar 40 mg. Called daughter back no answer LMOM of new sample policy we do have a saving card that she can pick-up will leave at front desk for pick-up...[name]

## 2013-11-26 ENCOUNTER — Telehealth: Payer: Self-pay | Admitting: Internal Medicine

## 2013-11-26 DIAGNOSIS — I10 Essential (primary) hypertension: Secondary | ICD-10-CM

## 2013-11-26 MED ORDER — LOSARTAN POTASSIUM 100 MG PO TABS: 100.0000 mg | ORAL_TABLET | Freq: Every day | ORAL | Status: DC

## 2013-11-26 NOTE — Telephone Encounter (Signed)
Pt's daughter request something different that will be cheaper than Benicar. Please advise, pt used [facility] on [address]  Pt is out of this med.

## 2013-11-26 NOTE — Telephone Encounter (Signed)
Pt's daughter is aware 

## 2013-11-26 NOTE — Telephone Encounter (Signed)
changed

## 2013-12-30 ENCOUNTER — Telehealth: Payer: Self-pay | Admitting: Internal Medicine

## 2013-12-30 DIAGNOSIS — I493 Ventricular premature depolarization: Secondary | ICD-10-CM

## 2013-12-30 NOTE — Telephone Encounter (Signed)
done

## 2013-12-30 NOTE — Telephone Encounter (Signed)
Daughter called and said that she made the pt an appt for Friday 01/01/2014 with cardiology.  She said that she just realized he need a referral.  She wanted to know if there is anyway that dr Ronnald Ramp could give him a referral by friday because she said that he really needs to go.  I told her that pt as appt on the 10th but she really wants to get him to that heart dr Friday if possible.   Best number is [phone]  Thank You!!!!!

## 2013-12-31 ENCOUNTER — Ambulatory Visit: Payer: Self-pay | Admitting: Internal Medicine

## 2014-01-01 ENCOUNTER — Encounter: Payer: Self-pay | Admitting: Cardiology

## 2014-01-01 ENCOUNTER — Ambulatory Visit (INDEPENDENT_AMBULATORY_CARE_PROVIDER_SITE_OTHER): Payer: Commercial Managed Care - HMO | Admitting: Cardiology

## 2014-01-01 VITALS — BP 122/70 | HR 71 | Ht 66.0 in | Wt 163.4 lb

## 2014-01-01 DIAGNOSIS — R0600 Dyspnea, unspecified: Secondary | ICD-10-CM

## 2014-01-01 DIAGNOSIS — I10 Essential (primary) hypertension: Secondary | ICD-10-CM

## 2014-01-01 DIAGNOSIS — R0989 Other specified symptoms and signs involving the circulatory and respiratory systems: Secondary | ICD-10-CM

## 2014-01-01 DIAGNOSIS — R0609 Other forms of dyspnea: Secondary | ICD-10-CM

## 2014-01-01 NOTE — Progress Notes (Signed)
01/01/2014 Russell [name]   [birth year]  [id number]  Primary Physicia Scarlette Calico, MD Primary Cardiologist: Dr. Caryl Comes  HPI:  The patient is an 80 y/o AAM followed by Dr. Caryl Comes. He has been documented to have a high burden of PVCs, however there has been no clear symptomatic correlation and so it has been elected not to treat him. He had a 2D echo in 01/2012 demonstrating normal systolic function with an EF at 50-55%. He has no documented history of CAD. His history is significant for HTN, HLD and DM.   He presents to clinic today accompanied by his oldest daughter. He complains of DOE and increased fatigue. Symptoms started ~3 months ago and have not improved. He also has a chronic dry cough. His is not on an ACE-I but he is on an ARB. No significant dyspnea at rest. No orthopnea, PND, LEE, chest pain, palpitations, lightheadedness, syncope/ near syncope, melena or hematochezia.    Current Outpatient Prescriptions  Medication Sig Dispense Refill  . ALPRAZolam (XANAX) 0.5 MG tablet TAKE ONE TABLET BY MOUTH THREE TIMES DAILY AS NEEDED FOR SLEEP  60 tablet  3  . aspirin 81 MG tablet Take 81 mg by mouth daily.        . B Complex Vitamins (VITAMIN-B COMPLEX PO) Take by mouth.        . Blood Glucose Monitoring Suppl (ACCU-CHEK AVIVA PLUS) W/DEVICE KIT 1 kit by Does not apply route 3 (three) times daily.  1 kit  1  . fesoterodine (TOVIAZ) 8 MG TB24 tablet Take 8 mg by mouth.      Marland Kitchen glucose blood (ACCU-CHEK AVIVA) test strip Test up to TID Dx: 250.02  300 each  3  . hydrochlorothiazide (HYDRODIURIL) 25 MG tablet TAKE ONE TABLET BY MOUTH EVERY DAY IN THE MORNING  90 tablet  3  . HYDROcodone-acetaminophen (NORCO/VICODIN) 5-325 MG per tablet Take 1 tablet by mouth every 6 (six) hours as needed.  100 tablet  0  . Lancets (ACCU-CHEK SOFT TOUCH) lancets Test up to TID Dx: 250.02  100 each  12  . losartan (COZAAR) 100 MG tablet Take 1 tablet (100 mg total) by mouth daily.  90 tablet  3  . Multiple Vitamin  (ONE-A-DAY MENS PO) Take 1 tablet by mouth daily.       Marland Kitchen olmesartan (BENICAR) 40 MG tablet Take 40 mg by mouth.      Marland Kitchen omeprazole (PRILOSEC OTC) 20 MG tablet Take 20 mg by mouth daily as needed.      . sertraline (ZOLOFT) 100 MG tablet Take 1 tablet (100 mg total) by mouth daily.  90 tablet  3  . simvastatin (ZOCOR) 80 MG tablet TAKE ONE-HALF TABLET BY MOUTH EVERY DAY AT BEDTIME  90 tablet  3  . triamcinolone cream (KENALOG) 0.1 %       . trospium (SANCTURA) 20 MG tablet        No current facility-administered medications for this visit.    No Known Allergies  History   Social History  . Marital Status: Widowed    Spouse Name: N/A    Number of Children: N/A  . Years of Education: N/A   Occupational History  . Not on file.   Social History Main Topics  . Smoking status: Former Smoker    Quit date: 03/20/1967  . Smokeless tobacco: Never Used  . Alcohol Use: No     Comment: quit '68  . Drug Use: No  . Sexual Activity: Not  Currently   Other Topics Concern  . Not on file   Social History Narrative   7th grade education. Married '51 - 9/09. 3 daughters- '51, '53, '66; 1 son- '54. Lives w/ daughter Russell [name] who has schizophrenia since age 30. 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  Cardiovascular: negative for chest pain, dyspnea on exertion, edema, orthopnea, palpitations, paroxysmal nocturnal dyspnea or shortness of breath Dermatological: negative for rash Respiratory: negative for cough or wheezing Urologic: negative for hematuria Abdominal: negative for nausea, vomiting, diarrhea, bright red blood per rectum, melena, or hematemesis Neurologic: negative for visual changes, syncope, or dizziness All other systems reviewed and are otherwise negative except as noted above.    Blood pressure 122/70, pulse 71, height _0  (1.676 m), weight 163 lb 6.4 oz (74.118 kg).    General appearance: alert, cooperative and no distress, dentition in poor repair Neck: no carotid bruit and no JVD Lungs: clear to auscultation bilaterally Heart: regular rate and rhythm, S1, S2 normal, no murmur, click, rub or gallop Extremities: no LEE Pulses: 2+ and symmetric Skin: warm and dry Neurologic: Grossly normal  EKG SR with a PVC. 71 bpm  ASSESSMENT AND PLAN:   1. DOE + Fatigue: check 2D echo to reassess systolic function, CBC to r/o anemia, TSH to r/o hypothyroidism and BMP to assess electrolytes.   PLAN  2D echo to assess LV function. He has an appt with is PCP next week. Daughter states that he is a "hard stick" when it comes to obtaining blood. She has asked if he can have blood work drawn next week when he sees his PCP as he will likely need blood work done at that time as well. This is reasonable. I have recommended that his PCP check a CBC, BMP and TSH for fatigue and DOE. Can forward the results of our office if not on Epic. F/u in 2-3 weeks after 2D echo.   Russell [name], BRITTAINYPA-C 01/01/2014 3:19 PM

## 2014-01-01 NOTE — Patient Instructions (Signed)
Your physician has requested that you have an echocardiogram. Echocardiography is a painless test that uses sound waves to create images of your heart. It provides your doctor with information about the size and shape of your heart and how well your heart's chambers and valves are working. This procedure takes approximately one hour. There are no restrictions for this procedure.  Your Physician recommends you to follow up with your PCP during visit have : [place] recommends that you schedule a follow-up appointment AT THE END OF THE MONTH WITH BRITTANY SIMMONS PA-C

## 2014-01-06 ENCOUNTER — Telehealth: Payer: Self-pay | Admitting: *Deleted

## 2014-01-06 NOTE — Telephone Encounter (Signed)
Received called from pt daughter [name]) she stated father is coming in for appt tomorrow. Wanting to let md know he has been having problem with his nerves, being agitated. Mom pass in the month of  Sept & it seem like he gets agitated. Also been c/o (R) leg pain with swelling, and wanting md to advise father not to drive anymore. Daughter doesn't want father to know that she inform md.../lmb

## 2014-01-07 ENCOUNTER — Encounter: Payer: Self-pay | Admitting: Internal Medicine

## 2014-01-07 ENCOUNTER — Other Ambulatory Visit (INDEPENDENT_AMBULATORY_CARE_PROVIDER_SITE_OTHER): Payer: Commercial Managed Care - HMO

## 2014-01-07 ENCOUNTER — Ambulatory Visit (INDEPENDENT_AMBULATORY_CARE_PROVIDER_SITE_OTHER): Payer: Commercial Managed Care - HMO | Admitting: Internal Medicine

## 2014-01-07 VITALS — BP 138/64 | HR 66 | Temp 97.6°F | Resp 18 | Ht 67.0 in | Wt 163.8 lb

## 2014-01-07 DIAGNOSIS — M159 Polyosteoarthritis, unspecified: Secondary | ICD-10-CM

## 2014-01-07 DIAGNOSIS — I10 Essential (primary) hypertension: Secondary | ICD-10-CM

## 2014-01-07 DIAGNOSIS — R32 Unspecified urinary incontinence: Secondary | ICD-10-CM

## 2014-01-07 DIAGNOSIS — Z23 Encounter for immunization: Secondary | ICD-10-CM

## 2014-01-07 DIAGNOSIS — E785 Hyperlipidemia, unspecified: Secondary | ICD-10-CM

## 2014-01-07 DIAGNOSIS — M150 Primary generalized (osteo)arthritis: Secondary | ICD-10-CM

## 2014-01-07 DIAGNOSIS — M1990 Unspecified osteoarthritis, unspecified site: Secondary | ICD-10-CM

## 2014-01-07 LAB — CBC WITH DIFFERENTIAL/PLATELET
BASOS PCT: 0.3 % (ref 0.0–3.0)
Basophils Absolute: 0 10*3/uL (ref 0.0–0.1)
EOS ABS: 0.3 10*3/uL (ref 0.0–0.7)
Eosinophils Relative: 7 % — ABNORMAL HIGH (ref 0.0–5.0)
HEMATOCRIT: 41.8 % (ref 39.0–52.0)
Hemoglobin: 13.9 g/dL (ref 13.0–17.0)
Lymphocytes Relative: 24.4 % (ref 12.0–46.0)
Lymphs Abs: 1.1 10*3/uL (ref 0.7–4.0)
MCHC: 33.2 g/dL (ref 30.0–36.0)
MCV: 89.2 fl (ref 78.0–100.0)
MONO ABS: 0.6 10*3/uL (ref 0.1–1.0)
Monocytes Relative: 13.5 % — ABNORMAL HIGH (ref 3.0–12.0)
NEUTROS PCT: 54.8 % (ref 43.0–77.0)
Neutro Abs: 2.6 10*3/uL (ref 1.4–7.7)
Platelets: 178 10*3/uL (ref 150.0–400.0)
RBC: 4.69 Mil/uL (ref 4.22–5.81)
RDW: 15.7 % — ABNORMAL HIGH (ref 11.5–15.5)
WBC: 4.7 10*3/uL (ref 4.0–10.5)

## 2014-01-07 LAB — COMPREHENSIVE METABOLIC PANEL
ALBUMIN: 3.9 g/dL (ref 3.5–5.2)
ALK PHOS: 99 U/L (ref 39–117)
ALT: 22 U/L (ref 0–53)
AST: 30 U/L (ref 0–37)
BILIRUBIN TOTAL: 0.6 mg/dL (ref 0.2–1.2)
BUN: 34 mg/dL — ABNORMAL HIGH (ref 6–23)
CO2: 28 mEq/L (ref 19–32)
Calcium: 9.4 mg/dL (ref 8.4–10.5)
Chloride: 105 mEq/L (ref 96–112)
Creatinine, Ser: 1.2 mg/dL (ref 0.4–1.5)
GFR: 74.13 mL/min (ref >60.00)
GLUCOSE: 82 mg/dL (ref 70–99)
Potassium: 4.1 mEq/L (ref 3.5–5.1)
Sodium: 141 mEq/L (ref 135–145)
TOTAL PROTEIN: 7.1 g/dL (ref 6.0–8.3)

## 2014-01-07 LAB — LIPID PANEL
CHOLESTEROL: 127 mg/dL (ref 0–200)
HDL: 48.2 mg/dL (ref >39.00)
LDL CALC: 67 mg/dL (ref 0–99)
NonHDL: 78.8
Total CHOL/HDL Ratio: 3
Triglycerides: 58 mg/dL (ref 0.0–149.0)
VLDL: 11.6 mg/dL (ref 0.0–40.0)

## 2014-01-07 LAB — TSH: TSH: 2.48 u[IU]/mL (ref 0.35–4.50)

## 2014-01-07 LAB — CK: Total CK: 134 U/L (ref 7–232)

## 2014-01-07 MED ORDER — HYDROCODONE-ACETAMINOPHEN 5-325 MG PO TABS: 1.0000 | ORAL_TABLET | Freq: Four times a day (QID) | ORAL | Status: DC | PRN

## 2014-01-07 NOTE — Patient Instructions (Signed)

## 2014-01-07 NOTE — Progress Notes (Signed)
Pre visit review using our clinic review tool, if applicable. No additional management support is needed unless otherwise documented below in the visit note. 

## 2014-01-07 NOTE — Progress Notes (Signed)
Subjective:    Patient ID: Russell [name], male    DOB: 1923/07/29, 80 y.o.   MRN: [id number]  Arthritis Presents for follow-up visit. The disease course has been fluctuating. He complains of pain. He reports no stiffness, joint swelling or joint warmth. The symptoms have been stable. Affected locations include the left knee and right knee. His pain is at a severity of 4/10. Associated symptoms include fatigue, pain at night and pain while resting. Pertinent negatives include no diarrhea, dry eyes, dry mouth, dysuria, fever, rash, Raynaud's syndrome, uveitis or weight loss. His past medical history is significant for osteoarthritis. Past treatments include acetaminophen and an opioid. The treatment provided moderate relief. Factors aggravating his arthritis include activity. Compliance with prior treatments has been variable. Prior compliance problems include difficulty understanding directions. Compliance with total regimen is 26-50%.      Review of Systems  Constitutional: Positive for fatigue. Negative for fever, chills, weight loss, diaphoresis and appetite change.  HENT: Negative.   Eyes: Negative.   Respiratory: Negative.  Negative for cough, choking, chest tightness, shortness of breath and stridor.   Cardiovascular: Positive for leg swelling. Negative for chest pain and palpitations.  Gastrointestinal: Negative for nausea, vomiting, abdominal pain, diarrhea, constipation and blood in stool.  Endocrine: Negative.   Genitourinary: Negative.  Negative for dysuria.  Musculoskeletal: Positive for arthralgias and arthritis. Negative for back pain, gait problem, joint swelling, myalgias, neck pain, neck stiffness and stiffness.  Skin: Negative.  Negative for rash.  Allergic/Immunologic: Negative.   Neurological: Negative.   Hematological: Negative.  Negative for adenopathy. Does not bruise/bleed easily.  Psychiatric/Behavioral: Negative.        Objective:   Physical Exam  Vitals  reviewed. Constitutional: He is oriented to person, place, and time. He appears well-developed and well-nourished. No distress.  HENT:  Head: Normocephalic and atraumatic.  Mouth/Throat: Oropharynx is clear and moist. No oropharyngeal exudate.  Eyes: Conjunctivae are normal. Right eye exhibits no discharge. Left eye exhibits no discharge. No scleral icterus.  Neck: Normal range of motion. Neck supple. No JVD present. No tracheal deviation present. No thyromegaly present.  Cardiovascular: Normal rate, regular rhythm, normal heart sounds and intact distal pulses.  Exam reveals no gallop and no friction rub.   No murmur heard. Pulmonary/Chest: Effort normal and breath sounds normal. No stridor. No respiratory distress. He has no wheezes. He has no rales. He exhibits no tenderness.  Abdominal: Soft. Bowel sounds are normal. He exhibits no distension and no mass. There is no tenderness. There is no rebound and no guarding.  Musculoskeletal: Normal range of motion. He exhibits edema (trace pitting edema in BLE). He exhibits no tenderness.  Lymphadenopathy:    He has no cervical adenopathy.  Neurological: He is oriented to person, place, and time.  Skin: Skin is warm and dry. No rash noted. He is not diaphoretic. No erythema. No pallor.     Lab Results  Component Value Date   WBC 3.8* 02/20/2013   HGB 14.1 02/20/2013   HCT 42.1 02/20/2013   PLT 189.0 02/20/2013   GLUCOSE 84 06/30/2013   CHOL 146 08/20/2012   TRIG 50.0 08/20/2012   HDL 51.30 08/20/2012   LDLCALC 85 08/20/2012   ALT 17 02/20/2013   AST 25 02/20/2013   NA 139 06/30/2013   K 3.7 06/30/2013   CL 104 06/30/2013   CREATININE 1.4 06/30/2013   BUN 32* 06/30/2013   CO2 28 06/30/2013   TSH 2.69 08/20/2012   PSA 1.94 02/20/2013  HGBA1C 5.7 02/20/2013   MICROALBUR 1.6 09/07/2009       Assessment & Plan:

## 2014-01-10 ENCOUNTER — Encounter: Payer: Self-pay | Admitting: Internal Medicine

## 2014-01-10 NOTE — Assessment & Plan Note (Signed)
His BP is well controlled, lytes and renal function are stable

## 2014-01-10 NOTE — Assessment & Plan Note (Signed)
He has achieved his LDL goal 

## 2014-01-10 NOTE — Assessment & Plan Note (Signed)
Will cont norco as needed for pain 

## 2014-01-13 ENCOUNTER — Ambulatory Visit (HOSPITAL_COMMUNITY): Payer: Medicare HMO | Attending: Cardiology | Admitting: Radiology

## 2014-01-13 DIAGNOSIS — I10 Essential (primary) hypertension: Secondary | ICD-10-CM | POA: Diagnosis not present

## 2014-01-13 DIAGNOSIS — R0609 Other forms of dyspnea: Secondary | ICD-10-CM | POA: Diagnosis present

## 2014-01-13 DIAGNOSIS — E119 Type 2 diabetes mellitus without complications: Secondary | ICD-10-CM | POA: Insufficient documentation

## 2014-01-13 DIAGNOSIS — R0989 Other specified symptoms and signs involving the circulatory and respiratory systems: Principal | ICD-10-CM | POA: Insufficient documentation

## 2014-01-13 DIAGNOSIS — R0602 Shortness of breath: Secondary | ICD-10-CM

## 2014-01-13 DIAGNOSIS — R0600 Dyspnea, unspecified: Secondary | ICD-10-CM

## 2014-01-13 NOTE — Progress Notes (Signed)
Echocardiogram performed.  

## 2014-01-25 ENCOUNTER — Ambulatory Visit (INDEPENDENT_AMBULATORY_CARE_PROVIDER_SITE_OTHER): Payer: Commercial Managed Care - HMO | Admitting: Cardiology

## 2014-01-25 ENCOUNTER — Encounter: Payer: Self-pay | Admitting: Cardiology

## 2014-01-25 VITALS — BP 160/60 | HR 70 | Ht 67.0 in | Wt 160.0 lb

## 2014-01-25 DIAGNOSIS — R0609 Other forms of dyspnea: Secondary | ICD-10-CM

## 2014-01-25 DIAGNOSIS — I5041 Acute combined systolic (congestive) and diastolic (congestive) heart failure: Secondary | ICD-10-CM

## 2014-01-25 DIAGNOSIS — R0989 Other specified symptoms and signs involving the circulatory and respiratory systems: Secondary | ICD-10-CM

## 2014-01-25 NOTE — Patient Instructions (Signed)
Your physician recommends that you continue on your current medications as directed. Please refer to the Current Medication list given to you today.  Your physician wants you to follow-up in: 1 year with Dr. Klein.  You will receive a reminder letter in the mail two months in advance. If you don't receive a letter, please call our office to schedule the follow-up appointment.  

## 2014-01-25 NOTE — Progress Notes (Signed)
Patient ID: Russell [name], male   DOB: June 12, 1923, 80 y.o.   MRN: [id number]    01/25/2014 Russell [name]   May 12, 1923  [id number]  Primary Physician: Russell Calico, MD Primary Cardiologist: Dr. Caryl [name]  HPI:  Russell [name] presents to clinic today for followup. He is an 80 year old African American male followed by Dr. Caryl [name]. He had an echocardiogram in September 2013 which demonstrated normal systolic function with an EF of 50-55% at that time. He has no documented history of CAD. His history is also significant for hypertension, hyperlipidemia and diabetes.   I evaluated him 4 weeks ago in clinic when he presented with complaints of dyspnea on exertion and increased fatigue. He reported that his symptoms started about 3 months ago and have been fairly constant. He also endorsed a chronic dry cough. He denied any significant dyspnea at rest as well as no orthopnea, PND, lower extremity edema, chest pain, palpitations, lightheadedness, syncope/near-syncope, melena or hematochezia. Given his complaints, I recommended that we evaluate systolic/diastolic function as well as valvular anatomy. I also recommended that we obtain basic lab work including a CBC to rule out anemia and TSH to assess for thyroid dysfunction. His lab work was unremarkable. Compared to the prior echo in 2013, the LV is now more dilated, LVEF is lower at 42% with inferoseptal and inferior wall motion and strain abnormalities. There is moderate AI, mild to moderate MR, diastolic dysfunction and elevated LV filling pressure.  He presents back to clinic today for followup. He is accompanied by 2 of his daughters. He states that he has been doing fairly well since his last visit. He continues to have dyspnea on exertion but denies any dyspnea at rest. No orthopnea, PND, lower extremity edema, chest pain, syncope/near-syncope. We discussed his life goals and he reports that, given his age, he does not wish for any aggressive therapies. Therefore, we  have decided against further ischemic evaluation with nuclear stress testing as he would not want to pursue cardiac catheterization. He wishes to continue with medical therapy. He is already on aspirin, a thiazide diuretic, an ARB as well as statin therapy.     Current Outpatient Prescriptions  Medication Sig Dispense Refill  . ALPRAZolam (XANAX) 0.5 MG tablet TAKE ONE TABLET BY MOUTH THREE TIMES DAILY AS NEEDED FOR SLEEP  60 tablet  3  . aspirin 81 MG tablet Take 81 mg by mouth daily.        . B Complex Vitamins (VITAMIN-B COMPLEX PO) Take by mouth.        . Blood Glucose Monitoring Suppl (ACCU-CHEK AVIVA PLUS) W/DEVICE KIT 1 kit by Does not apply route 3 (three) times daily.  1 kit  1  . fesoterodine (TOVIAZ) 8 MG TB24 tablet Take 8 mg by mouth.      Marland Kitchen glucose blood (ACCU-CHEK AVIVA) test strip Test up to TID Dx: 250.02  300 each  3  . hydrochlorothiazide (HYDRODIURIL) 25 MG tablet TAKE ONE TABLET BY MOUTH EVERY DAY IN THE MORNING  90 tablet  3  . HYDROcodone-acetaminophen (NORCO/VICODIN) 5-325 MG per tablet Take 1 tablet by mouth every 6 (six) hours as needed.  100 tablet  0  . Lancets (ACCU-CHEK SOFT TOUCH) lancets Test up to TID Dx: 250.02  100 each  12  . losartan (COZAAR) 100 MG tablet Take 1 tablet (100 mg total) by mouth daily.  90 tablet  3  . Multiple Vitamin (ONE-A-DAY MENS PO) Take 1 tablet by mouth daily.       Marland Kitchen  olmesartan (BENICAR) 40 MG tablet Take 40 mg by mouth.      Marland Kitchen omeprazole (PRILOSEC OTC) 20 MG tablet Take 20 mg by mouth daily as needed.      . sertraline (ZOLOFT) 100 MG tablet Take 1 tablet (100 mg total) by mouth daily.  90 tablet  3  . simvastatin (ZOCOR) 80 MG tablet TAKE ONE-HALF TABLET BY MOUTH EVERY DAY AT BEDTIME  90 tablet  3  . triamcinolone cream (KENALOG) 0.1 %       . trospium (SANCTURA) 20 MG tablet        No current facility-administered medications for this visit.    No Known Allergies  History   Social History  . Marital Status: Widowed     Spouse Name: N/A    Number of Children: N/A  . Years of Education: N/A   Occupational History  . Not on file.   Social History Main Topics  . Smoking status: Former Smoker    Quit date: 03/20/1967  . Smokeless tobacco: Never Used  . Alcohol Use: No     Comment: quit '68  . Drug Use: No  . Sexual Activity: Not Currently   Other Topics Concern  . Not on file   Social History Narrative   7th grade education. Married '51 - 9/09. 3 daughters- '51, '53, '66; 1 son- '54. Lives w/ daughter Russell [name] who has schizophrenia since age 78. 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  Cardiovascular: negative for chest pain, dyspnea on exertion, edema, orthopnea, palpitations, paroxysmal nocturnal dyspnea or shortness of breath Dermatological: negative for rash Respiratory: negative for cough or wheezing Urologic: negative for hematuria Abdominal: negative for nausea, vomiting, diarrhea, bright red blood per rectum, melena, or hematemesis Neurologic: negative for visual changes, syncope, or dizziness All other systems reviewed and are otherwise negative except as noted above.    Blood pressure 160/60, pulse 70, height 5' 7"  (1.702 m), weight 160 lb (72.576 kg), SpO2 98.00%.  General appearance: alert, cooperative and no distress Neck: no carotid bruit and no JVD Lungs: clear to auscultation bilaterally Heart: regular rate and rhythm, S1, S2 normal, no murmur, click, rub or gallop Extremities: no LEE Pulses: 2+ and symmetric Skin: warm and dry Neurologic: Grossly normal  EKG Not performed  ASSESSMENT AND PLAN:   1. Dyspnea/decreased exercise tolerance: Likely secondary to combined systolic + diastolic dysfunction. EF is now lower at 42%. Echo also notable for new wall motion abnormalities. However, patient is against cardiac catheterization, thus it is unnecessary to pursue any further  ischemic evaluation. Given his age and lack of chest pain symptoms, I feel that this is reasonable. He appears euvolemic on physical exam. He is already on a thiazide diuretic as well as an ARB. Recent BMP revealed normal renal function and stable electrolytes. I recommended that we continue this medical regimen. A low-sodium diet was stressed. If he proves to have problems with fluid management in the future, I recommended that we consider changing his diuretic to furosemide if needed. I have also recommended continuation of aspirin as well as simvastatin for primary prevention.   PLAN Patient does not desire any aggressive therapies going forward, given his age. With the exception of dyspnea on exertion he appears fairly stable without signs of fluid overload and without anginal symptoms. Despite dyspnea on exertion, this does not  seem to limit him significantly regarding his day-to-day activities. He is still able to get around fairly well. I recommended that we continue him on his current medical regimen. He has been instructed to followup with our office if he has any changes in his status particuarly any worsening dyspnea or chest pain. Otherwise, if stable, followup with Dr. Caryl [name] in 6 months to one year.   Russell [name], BRITTAINYPA-C 01/25/2014 1:34 PM

## 2014-02-17 ENCOUNTER — Other Ambulatory Visit: Payer: Self-pay

## 2014-02-17 DIAGNOSIS — F339 Major depressive disorder, recurrent, unspecified: Secondary | ICD-10-CM

## 2014-02-17 MED ORDER — SERTRALINE HCL 100 MG PO TABS: 100.0000 mg | ORAL_TABLET | Freq: Every day | ORAL | Status: DC

## 2014-03-08 ENCOUNTER — Inpatient Hospital Stay (HOSPITAL_COMMUNITY)
Admission: EM | Admit: 2014-03-08 | Discharge: 2014-03-15 | DRG: 390 | Disposition: A | Discharge status: Home Health | Payer: Medicare HMO | Attending: Surgery | Admitting: Surgery

## 2014-03-08 ENCOUNTER — Encounter (HOSPITAL_COMMUNITY): Payer: Self-pay | Admitting: Emergency Medicine

## 2014-03-08 DIAGNOSIS — F0390 Unspecified dementia without behavioral disturbance: Secondary | ICD-10-CM | POA: Diagnosis present

## 2014-03-08 DIAGNOSIS — Z923 Personal history of irradiation: Secondary | ICD-10-CM

## 2014-03-08 DIAGNOSIS — F419 Anxiety disorder, unspecified: Secondary | ICD-10-CM | POA: Diagnosis present

## 2014-03-08 DIAGNOSIS — I10 Essential (primary) hypertension: Secondary | ICD-10-CM | POA: Diagnosis present

## 2014-03-08 DIAGNOSIS — K565 Intestinal adhesions [bands], unspecified as to partial versus complete obstruction: Secondary | ICD-10-CM

## 2014-03-08 DIAGNOSIS — F329 Major depressive disorder, single episode, unspecified: Secondary | ICD-10-CM | POA: Diagnosis present

## 2014-03-08 DIAGNOSIS — K56609 Unspecified intestinal obstruction, unspecified as to partial versus complete obstruction: Secondary | ICD-10-CM

## 2014-03-08 DIAGNOSIS — E860 Dehydration: Secondary | ICD-10-CM | POA: Diagnosis present

## 2014-03-08 DIAGNOSIS — Z87891 Personal history of nicotine dependence: Secondary | ICD-10-CM

## 2014-03-08 DIAGNOSIS — Z8546 Personal history of malignant neoplasm of prostate: Secondary | ICD-10-CM

## 2014-03-08 DIAGNOSIS — E785 Hyperlipidemia, unspecified: Secondary | ICD-10-CM | POA: Diagnosis present

## 2014-03-08 DIAGNOSIS — Z85038 Personal history of other malignant neoplasm of large intestine: Secondary | ICD-10-CM

## 2014-03-08 DIAGNOSIS — E11649 Type 2 diabetes mellitus with hypoglycemia without coma: Secondary | ICD-10-CM | POA: Diagnosis not present

## 2014-03-08 DIAGNOSIS — R109 Unspecified abdominal pain: Secondary | ICD-10-CM | POA: Diagnosis not present

## 2014-03-08 DIAGNOSIS — H9190 Unspecified hearing loss, unspecified ear: Secondary | ICD-10-CM | POA: Diagnosis present

## 2014-03-08 DIAGNOSIS — Z7982 Long term (current) use of aspirin: Secondary | ICD-10-CM

## 2014-03-08 DIAGNOSIS — Z79899 Other long term (current) drug therapy: Secondary | ICD-10-CM

## 2014-03-08 HISTORY — DX: Unspecified intestinal obstruction, unspecified as to partial versus complete obstruction: K56.609

## 2014-03-08 NOTE — ED Notes (Signed)
Pt. lost his balance and fell twice at home  today with mild mid abdominal pain , emesis and diarrhea. Denies fever or chills. No LOC , alert /disoriented at triage .

## 2014-03-09 ENCOUNTER — Inpatient Hospital Stay (HOSPITAL_COMMUNITY): Payer: Medicare HMO

## 2014-03-09 ENCOUNTER — Other Ambulatory Visit: Payer: Self-pay | Admitting: Internal Medicine

## 2014-03-09 ENCOUNTER — Emergency Department (HOSPITAL_COMMUNITY): Payer: Medicare HMO

## 2014-03-09 ENCOUNTER — Encounter (HOSPITAL_COMMUNITY): Payer: Self-pay

## 2014-03-09 DIAGNOSIS — R109 Unspecified abdominal pain: Secondary | ICD-10-CM | POA: Diagnosis present

## 2014-03-09 DIAGNOSIS — E860 Dehydration: Secondary | ICD-10-CM | POA: Diagnosis present

## 2014-03-09 DIAGNOSIS — Z7982 Long term (current) use of aspirin: Secondary | ICD-10-CM | POA: Diagnosis not present

## 2014-03-09 DIAGNOSIS — K565 Intestinal adhesions [bands], unspecified as to partial versus complete obstruction: Secondary | ICD-10-CM | POA: Diagnosis present

## 2014-03-09 DIAGNOSIS — H9190 Unspecified hearing loss, unspecified ear: Secondary | ICD-10-CM | POA: Diagnosis present

## 2014-03-09 DIAGNOSIS — Z85038 Personal history of other malignant neoplasm of large intestine: Secondary | ICD-10-CM | POA: Diagnosis not present

## 2014-03-09 DIAGNOSIS — F0390 Unspecified dementia without behavioral disturbance: Secondary | ICD-10-CM | POA: Diagnosis present

## 2014-03-09 DIAGNOSIS — Z79899 Other long term (current) drug therapy: Secondary | ICD-10-CM | POA: Diagnosis not present

## 2014-03-09 DIAGNOSIS — Z923 Personal history of irradiation: Secondary | ICD-10-CM | POA: Diagnosis not present

## 2014-03-09 DIAGNOSIS — E785 Hyperlipidemia, unspecified: Secondary | ICD-10-CM | POA: Diagnosis present

## 2014-03-09 DIAGNOSIS — F419 Anxiety disorder, unspecified: Secondary | ICD-10-CM | POA: Diagnosis present

## 2014-03-09 DIAGNOSIS — Z87891 Personal history of nicotine dependence: Secondary | ICD-10-CM | POA: Diagnosis not present

## 2014-03-09 DIAGNOSIS — Z8546 Personal history of malignant neoplasm of prostate: Secondary | ICD-10-CM | POA: Diagnosis not present

## 2014-03-09 DIAGNOSIS — F329 Major depressive disorder, single episode, unspecified: Secondary | ICD-10-CM | POA: Diagnosis present

## 2014-03-09 DIAGNOSIS — E11649 Type 2 diabetes mellitus with hypoglycemia without coma: Secondary | ICD-10-CM | POA: Diagnosis not present

## 2014-03-09 DIAGNOSIS — I10 Essential (primary) hypertension: Secondary | ICD-10-CM | POA: Diagnosis present

## 2014-03-09 LAB — COMPREHENSIVE METABOLIC PANEL
ALBUMIN: 3.6 g/dL (ref 3.5–5.2)
ALK PHOS: 110 U/L (ref 39–117)
ALT: 15 U/L (ref 0–53)
AST: 29 U/L (ref 0–37)
Anion gap: 18 — ABNORMAL HIGH (ref 5–15)
BILIRUBIN TOTAL: 0.4 mg/dL (ref 0.3–1.2)
BUN: 43 mg/dL — ABNORMAL HIGH (ref 6–23)
CHLORIDE: 99 meq/L (ref 96–112)
CO2: 25 mEq/L (ref 19–32)
Calcium: 9.5 mg/dL (ref 8.4–10.5)
Creatinine, Ser: 1.67 mg/dL — ABNORMAL HIGH (ref 0.50–1.35)
GFR calc Af Amer: 41 mL/min — ABNORMAL LOW (ref >90)
GFR calc non Af Amer: 35 mL/min — ABNORMAL LOW (ref >90)
Glucose, Bld: 139 mg/dL — ABNORMAL HIGH (ref 70–99)
POTASSIUM: 4 meq/L (ref 3.7–5.3)
SODIUM: 142 meq/L (ref 137–147)
TOTAL PROTEIN: 6.8 g/dL (ref 6.0–8.3)

## 2014-03-09 LAB — GLUCOSE, CAPILLARY
GLUCOSE-CAPILLARY: 115 mg/dL — AB (ref 70–99)
GLUCOSE-CAPILLARY: 77 mg/dL (ref 70–99)
Glucose-Capillary: 106 mg/dL — ABNORMAL HIGH (ref 70–99)
Glucose-Capillary: 100 mg/dL — ABNORMAL HIGH (ref 70–99)
Glucose-Capillary: 83 mg/dL (ref 70–99)

## 2014-03-09 LAB — CBC WITH DIFFERENTIAL/PLATELET
BASOS PCT: 0 % (ref 0–1)
Basophils Absolute: 0 10*3/uL (ref 0.0–0.1)
EOS ABS: 0 10*3/uL (ref 0.0–0.7)
Eosinophils Relative: 0 % (ref 0–5)
HCT: 41 % (ref 39.0–52.0)
HEMOGLOBIN: 14.2 g/dL (ref 13.0–17.0)
LYMPHS ABS: 0.5 10*3/uL — AB (ref 0.7–4.0)
Lymphocytes Relative: 6 % — ABNORMAL LOW (ref 12–46)
MCH: 29.5 pg (ref 26.0–34.0)
MCHC: 34.6 g/dL (ref 30.0–36.0)
MCV: 85.1 fL (ref 78.0–100.0)
MONO ABS: 0.6 10*3/uL (ref 0.1–1.0)
MONOS PCT: 7 % (ref 3–12)
Neutro Abs: 7.3 10*3/uL (ref 1.7–7.7)
Neutrophils Relative %: 87 % — ABNORMAL HIGH (ref 43–77)
Platelets: 171 10*3/uL (ref 150–400)
RBC: 4.82 MIL/uL (ref 4.22–5.81)
RDW: 14.4 % (ref 11.5–15.5)
WBC: 8.4 10*3/uL (ref 4.0–10.5)

## 2014-03-09 LAB — HEMOGLOBIN A1C
Hgb A1c MFr Bld: 5.7 % — ABNORMAL HIGH (ref <5.7)
Mean Plasma Glucose: 117 mg/dL — ABNORMAL HIGH (ref <117)

## 2014-03-09 LAB — URINALYSIS, ROUTINE W REFLEX MICROSCOPIC
GLUCOSE, UA: NEGATIVE mg/dL
Ketones, ur: 15 mg/dL — AB
Leukocytes, UA: NEGATIVE
Nitrite: NEGATIVE
Protein, ur: NEGATIVE mg/dL
SPECIFIC GRAVITY, URINE: 1.015 (ref 1.005–1.030)
UROBILINOGEN UA: 0.2 mg/dL (ref 0.0–1.0)
pH: 5 (ref 5.0–8.0)

## 2014-03-09 LAB — LIPASE, BLOOD: Lipase: 11 U/L (ref 11–59)

## 2014-03-09 LAB — URINE MICROSCOPIC-ADD ON

## 2014-03-09 LAB — OCCULT BLOOD GASTRIC / DUODENUM (SPECIMEN CUP): OCCULT BLOOD, GASTRIC: POSITIVE — AB

## 2014-03-09 MED ORDER — ONDANSETRON HCL 4 MG/2ML IJ SOLN: 4.0000 mg | Freq: Four times a day (QID) | INTRAMUSCULAR | Status: DC | PRN

## 2014-03-09 MED ORDER — POTASSIUM CHLORIDE IN NACL 20-0.9 MEQ/L-% IV SOLN
INTRAVENOUS | Status: DC
  Administered 2014-03-09 (×2): via INTRAVENOUS
  Filled 2014-03-09 (×3): qty 1000

## 2014-03-09 MED ORDER — DIPHENHYDRAMINE HCL 50 MG/ML IJ SOLN: 12.5000 mg | Freq: Four times a day (QID) | INTRAMUSCULAR | Status: DC | PRN

## 2014-03-09 MED ORDER — FESOTERODINE FUMARATE ER 8 MG PO TB24
8.0000 mg | ORAL_TABLET | Freq: Every day | ORAL | Status: DC
  Administered 2014-03-09 – 2014-03-15 (×7): 8 mg via ORAL
  Filled 2014-03-09 (×7): qty 1

## 2014-03-09 MED ORDER — SODIUM CHLORIDE 0.9 % IV BOLUS (SEPSIS)
1000.0000 mL | Freq: Once | INTRAVENOUS | Status: AC
  Administered 2014-03-09: 1000 mL via INTRAVENOUS

## 2014-03-09 MED ORDER — ENOXAPARIN SODIUM 30 MG/0.3ML ~~LOC~~ SOLN
30.0000 mg | SUBCUTANEOUS | Status: DC
  Administered 2014-03-11: 30 mg via SUBCUTANEOUS
  Filled 2014-03-09 (×3): qty 0.3

## 2014-03-09 MED ORDER — MORPHINE SULFATE 2 MG/ML IJ SOLN: 2.0000 mg | INTRAMUSCULAR | Status: DC | PRN

## 2014-03-09 MED ORDER — INSULIN ASPART 100 UNIT/ML ~~LOC~~ SOLN
0.0000 [IU] | SUBCUTANEOUS | Status: DC
  Administered 2014-03-12 – 2014-03-13 (×3): 1 [IU] via SUBCUTANEOUS
  Administered 2014-03-13: 3 [IU] via SUBCUTANEOUS
  Administered 2014-03-13 – 2014-03-14 (×2): 1 [IU] via SUBCUTANEOUS

## 2014-03-09 MED ORDER — IRBESARTAN 300 MG PO TABS
300.0000 mg | ORAL_TABLET | Freq: Every day | ORAL | Status: DC
  Administered 2014-03-09 – 2014-03-15 (×7): 300 mg via ORAL
  Filled 2014-03-09 (×7): qty 1

## 2014-03-09 MED ORDER — IOHEXOL 300 MG/ML  SOLN
25.0000 mL | Freq: Once | INTRAMUSCULAR | Status: AC | PRN
  Administered 2014-03-09: 25 mL via ORAL

## 2014-03-09 MED ORDER — DARIFENACIN HYDROBROMIDE ER 7.5 MG PO TB24
7.5000 mg | ORAL_TABLET | Freq: Every day | ORAL | Status: DC
  Administered 2014-03-09 – 2014-03-15 (×7): 7.5 mg via ORAL
  Filled 2014-03-09 (×7): qty 1

## 2014-03-09 MED ORDER — ONDANSETRON HCL 4 MG/2ML IJ SOLN
4.0000 mg | Freq: Once | INTRAMUSCULAR | Status: AC
  Administered 2014-03-09: 4 mg via INTRAVENOUS
  Filled 2014-03-09: qty 2

## 2014-03-09 MED ORDER — CETYLPYRIDINIUM CHLORIDE 0.05 % MT LIQD
7.0000 mL | Freq: Two times a day (BID) | OROMUCOSAL | Status: DC
  Administered 2014-03-09 – 2014-03-14 (×6): 7 mL via OROMUCOSAL

## 2014-03-09 MED ORDER — SERTRALINE HCL 100 MG PO TABS
100.0000 mg | ORAL_TABLET | Freq: Every day | ORAL | Status: DC
Start: 2014-03-09 — End: 2014-03-15
  Administered 2014-03-09 – 2014-03-15 (×7): 100 mg via ORAL
  Filled 2014-03-09 (×7): qty 1

## 2014-03-09 MED ORDER — LOSARTAN POTASSIUM 50 MG PO TABS
100.0000 mg | ORAL_TABLET | Freq: Every day | ORAL | Status: DC
  Administered 2014-03-09 – 2014-03-15 (×7): 100 mg via ORAL
  Filled 2014-03-09 (×8): qty 2

## 2014-03-09 MED ORDER — IOHEXOL 300 MG/ML  SOLN
80.0000 mL | Freq: Once | INTRAMUSCULAR | Status: AC | PRN
  Administered 2014-03-09: 80 mL via INTRAVENOUS

## 2014-03-09 MED ORDER — PANTOPRAZOLE SODIUM 40 MG IV SOLR
40.0000 mg | Freq: Two times a day (BID) | INTRAVENOUS | Status: DC
  Administered 2014-03-09 – 2014-03-12 (×8): 40 mg via INTRAVENOUS
  Filled 2014-03-09 (×13): qty 40

## 2014-03-09 MED ORDER — CHLORHEXIDINE GLUCONATE 0.12 % MT SOLN
15.0000 mL | Freq: Two times a day (BID) | OROMUCOSAL | Status: DC
  Administered 2014-03-09 – 2014-03-15 (×10): 15 mL via OROMUCOSAL
  Filled 2014-03-09 (×7): qty 15

## 2014-03-09 MED ORDER — DIPHENHYDRAMINE HCL 12.5 MG/5ML PO ELIX: 12.5000 mg | ORAL_SOLUTION | Freq: Four times a day (QID) | ORAL | Status: DC | PRN

## 2014-03-09 MED ORDER — PANTOPRAZOLE SODIUM 40 MG IV SOLR: 40.0000 mg | Freq: Every day | INTRAVENOUS | Status: DC

## 2014-03-09 NOTE — Progress Notes (Signed)
Subjective: Appears comfortable, NG draining dark brownish colored fluid, looks like it would be occult positive.  He knows where he is but struggles with time and dates.  Objective: Vital signs in last 24 hours: Temp:  [98.3 F (36.8 C)-98.7 F (37.1 C)] 98.3 F (36.8 C) (11/10 0943) Pulse Rate:  [56-84] 61 (11/10 0943) Resp:  [14-22] 16 (11/10 0943) BP: (102-169)/(46-60) 156/57 mmHg (11/10 0943) SpO2:  [100 %] 100 % (11/10 0943) Weight:  [69.4 kg (153 lb)-74.844 kg (165 lb)] 69.4 kg (153 lb) (11/10 0654) Last BM Date: 03/09/14 200 ml emesis recorded. Afebrile, VSS No labs since admit this AM Intake/Output from previous day: 11/09 0701 - 11/10 0700 In: 1000 [I.V.:1000] Out: 400 [Emesis/NG output:200] Intake/Output this shift:    General appearance: alert, cooperative and no distress Resp: clear to auscultation bilaterally GI: few BS, not very distended. says he's having flatus, I'm not sure what to believe.  Lab Results:   Recent Labs  03/09/14 0129  WBC 8.4  HGB 14.2  HCT 41.0  PLT 171    BMET  Recent Labs  03/09/14 0129  NA 142  K 4.0  CL 99  CO2 25  GLUCOSE 139*  BUN 43*  CREATININE 1.67*  CALCIUM 9.5   PT/INR No results for input(s): LABPROT, INR in the last 72 hours.   Recent Labs Lab 03/09/14 0129  AST 29  ALT 15  ALKPHOS 110  BILITOT 0.4  PROT 6.8  ALBUMIN 3.6     Lipase     Component Value Date/Time   LIPASE 11 03/09/2014 0129     Studies/Results: Ct Abdomen Pelvis W Contrast  03/09/2014   CLINICAL DATA:  Nausea, vomiting and mid abdominal pain. Status post fall x2 03/08/2014.  EXAM: CT ABDOMEN AND PELVIS WITH CONTRAST  TECHNIQUE: Multidetector CT imaging of the abdomen and pelvis was performed using the standard protocol following bolus administration of intravenous contrast.  CONTRAST:  25 mL OMNIPAQUE IOHEXOL 300 MG/ML SOLN, 80 mL OMNIPAQUE IOHEXOL 300 MG/ML SOLN  COMPARISON:  CT abdomen and pelvis 11/11/2008.  FINDINGS:  Calcific coronary artery disease is identified. There is a small to moderate pericardial effusion. Cardiomegaly is present. No pleural effusion. Dependent atelectasis is noted.  The patient has a high-grade small bowel obstruction with small bowel loops dilated up to 4.3 cm and multiple air-fluid levels identified. Transition point is seen in the left upper quadrant of the abdomen on image 32 where an angulated loop of small bowel is present. Distal small bowel loops are decompressed. There is no pneumatosis, portal venous gas, free intraperitoneal air or fluid collection. The patient is status post right hemicolectomy.  Right adrenal adenoma is unchanged. The left adrenal gland, spleen and biliary tree are unremarkable. There is a 1 cm diameter nonobstructing stone lower pole of the right kidney, unchanged. Tiny hypoattenuating lesion and in the lower pole of the left kidney is compatible with a cyst. Small flash filling hemangioma in the right hepatic lobe is unchanged. No lytic or sclerotic bony lesion is seen.  IMPRESSION: The study is positive for high-grade small bowel obstruction. Transition point is identified in the right upper quadrant of the abdomen and the obstruction appears to be due to adhesions.  1 cm nonobstructing stone lower pole right kidney, unchanged.  Status post right hemicolectomy.  Small to moderate pericardial effusion.  Cardiomegaly.  Calcific coronary artery disease.   Electronically Signed   By: Inge Rise M.D.   On: 03/09/2014 03:53  Dg Abd Portable 2v  03/09/2014   CLINICAL DATA:  History of hemicolectomy. NG tube placement. History nausea, vomiting, abdominal pain.  EXAM: PORTABLE ABDOMEN - 2 VIEW  COMPARISON:  CT 03/09/2014.  FINDINGS: NG tube noted in the stomach. Surgical sutures are noted in the upper abdomen. Soft tissue structures are unremarkable. Contrast in the renal collecting system from prior CT. Persistent severely distended loops of small bowel consistent  with persistent small-bowel obstruction . Small collection of air noted over the right upper abdomen on decubitus view. This is most likely within a nondistended loop of bowel and correlates with air within bowel on prior CT. No free air noted along the liver edge or under the hemidiaphragm. Thoracolumbar degenerative change. Changes of ankylosing spondylitis noted .  IMPRESSION: 1. NG tube in good anatomic position. 2. Persistent prominent distention of small bowel consistent with persistent small bowel obstruction.   Electronically Signed   By: Marcello Moores  Register   On: 03/09/2014 07:55    Medications: . darifenacin  7.5 mg Oral Daily  . enoxaparin (LOVENOX) injection  30 mg Subcutaneous Q24H  . fesoterodine  8 mg Oral Daily  . insulin aspart  0-9 Units Subcutaneous 6 times per day  . irbesartan  300 mg Oral Daily  . losartan  100 mg Oral Daily  . pantoprazole (PROTONIX) IV  40 mg Intravenous QHS  . sertraline  100 mg Oral Daily   . 0.9 % NaCl with KCl 20 mEq / L 75 mL/hr at 03/09/14 7425   Prior to Admission medications   Medication Sig Start Date End Date Taking? Authorizing Provider  ALPRAZolam Duanne Moron) 0.5 MG tablet Take 0.5 mg by mouth 3 (three) times daily as needed for anxiety.   Yes Historical Provider, MD  aspirin 81 MG tablet Take 81 mg by mouth daily.     Yes Historical Provider, MD  B Complex Vitamins (VITAMIN-B COMPLEX PO) Take 1 tablet by mouth daily.    Yes Historical Provider, MD  fesoterodine (TOVIAZ) 8 MG TB24 tablet Take 8 mg by mouth daily.    Yes Historical Provider, MD  hydrochlorothiazide (HYDRODIURIL) 25 MG tablet TAKE ONE TABLET BY MOUTH EVERY DAY IN THE MORNING Patient taking differently: Take 25 mg by mouth daily.  06/30/13  Yes Janith Lima, MD  HYDROcodone-acetaminophen (NORCO/VICODIN) 5-325 MG per tablet Take 1 tablet by mouth every 6 (six) hours as needed. Patient taking differently: Take 1 tablet by mouth every 6 (six) hours as needed for moderate pain.  01/07/14   Yes Janith Lima, MD  losartan (COZAAR) 100 MG tablet Take 1 tablet (100 mg total) by mouth daily. 11/26/13  Yes Janith Lima, MD  Multiple Vitamin (MULTIVITAMIN WITH MINERALS) TABS tablet Take 1 tablet by mouth daily.   Yes Historical Provider, MD  olmesartan (BENICAR) 40 MG tablet Take 40 mg by mouth daily.    Yes Historical Provider, MD  omeprazole (PRILOSEC OTC) 20 MG tablet Take 20 mg by mouth daily.    Yes Historical Provider, MD  sertraline (ZOLOFT) 100 MG tablet Take 1 tablet (100 mg total) by mouth daily. 02/17/14  Yes Janith Lima, MD  simvastatin (ZOCOR) 80 MG tablet TAKE ONE-HALF TABLET BY MOUTH EVERY DAY AT BEDTIME Patient taking differently: Take 40 mg by mouth every evening.  06/30/13  Yes Janith Lima, MD  triamcinolone cream (KENALOG) 0.1 % Apply 1 application topically 2 (two) times daily.  05/20/13  Yes Historical Provider, MD  trospium (SANCTURA) 20 MG  tablet Take 20 mg by mouth at bedtime.  02/04/13  Yes Historical Provider, MD  ALPRAZolam Duanne Moron) 0.5 MG tablet TAKE ONE TABLET BY MOUTH THREE TIMES DAILY AS NEEDED FOR SLEEP 08/25/13   Janith Lima, MD  Blood Glucose Monitoring Suppl (ACCU-CHEK AVIVA PLUS) W/DEVICE KIT 1 kit by Does not apply route 3 (three) times daily. 11/04/12   Janith Lima, MD  glucose blood (ACCU-CHEK AVIVA) test strip Test up to TID Dx: 250.02 06/30/13   Janith Lima, MD  Lancets (ACCU-CHEK SOFT [facility]) lancets Test up to TID Dx: 250.02 11/10/12   Janith Lima, MD  Multiple Vitamin (ONE-A-DAY MENS PO) Take 1 tablet by mouth daily.     Historical Provider, MD     Assessment/Plan SBO with history of right hemicolectomy Dehydration Dementia AODM  Plan:  i will add PPI, check for occult.  Ng on straight suction, I fixed it.  Check labs in Am.  Hold lovenox till next labs in AM.  SCD's. Ask staff to ambulate him some.   LOS: 1 day    Russell [name] 03/09/2014

## 2014-03-09 NOTE — H&P (Signed)
Russell [name] is an 80 y.o. male.   Chief Complaint: Abdominal pain  HPI: 80 yo male with dementia presents with nausea, vomiting, abdominal distention.  He had two episodes of loose stools earlier today.  He had a slight fall today and just had an abrasion to his left elbow.  He is status post right hemicolectomy for colon cancer in 2006 by Dr. Zettie Pho.    Past Medical History  Diagnosis Date  . Hyperlipidemia   . Hypertension   . Dementia   . Anxiety associated with depression   . Urolithiasis   . History of cystoscopy     uretheral stricutre, 2005- Dr.Humphreys  . FTT (failure to thrive)     admited 08/2008  . Osteoarthritis     hands  . Colon cancer     s/p resection 12/06  . Prostate cancer     s/p XRT  . Diabetes mellitus     diet mgt (11`/12)  . Loss of hearing     being fitted for hearing aids (11/12)    Past Surgical History  Procedure Laterality Date  . Hemicolectomy  12/06  . Transurethral resection of prostate    . Xrt      ERT for prostate ca  . Cataract extraction, bilateral      Family History  Problem Relation Age of Onset  . Diabetes Daughter   . Cancer Neg Hx   . Early death Neg Hx   . Heart disease Neg Hx   . Hyperlipidemia Neg Hx   . Hypertension Neg Hx   . Kidney disease Neg Hx   . Stroke Neg Hx    Social History:  reports that he quit smoking about 47 years ago. He has never used smokeless tobacco. He reports that he does not drink alcohol or use illicit drugs.  Allergies: No Known Allergies  Prior to Admission medications   Medication Sig Start Date End Date Taking? Authorizing Provider  ALPRAZolam Duanne Moron) 0.5 MG tablet Take 0.5 mg by mouth 3 (three) times daily as needed for anxiety.   Yes Historical Provider, MD  aspirin 81 MG tablet Take 81 mg by mouth daily.     Yes Historical Provider, MD  B Complex Vitamins (VITAMIN-B COMPLEX PO) Take 1 tablet by mouth daily.    Yes Historical Provider, MD  fesoterodine (TOVIAZ) 8 MG TB24 tablet Take 8  mg by mouth daily.    Yes Historical Provider, MD  hydrochlorothiazide (HYDRODIURIL) 25 MG tablet TAKE ONE TABLET BY MOUTH EVERY DAY IN THE MORNING Patient taking differently: Take 25 mg by mouth daily.  06/30/13  Yes Janith Lima, MD  HYDROcodone-acetaminophen (NORCO/VICODIN) 5-325 MG per tablet Take 1 tablet by mouth every 6 (six) hours as needed. Patient taking differently: Take 1 tablet by mouth every 6 (six) hours as needed for moderate pain.  01/07/14  Yes Janith Lima, MD  losartan (COZAAR) 100 MG tablet Take 1 tablet (100 mg total) by mouth daily. 11/26/13  Yes Janith Lima, MD  Multiple Vitamin (MULTIVITAMIN WITH MINERALS) TABS tablet Take 1 tablet by mouth daily.   Yes Historical Provider, MD  olmesartan (BENICAR) 40 MG tablet Take 40 mg by mouth daily.    Yes Historical Provider, MD  omeprazole (PRILOSEC OTC) 20 MG tablet Take 20 mg by mouth daily.    Yes Historical Provider, MD  sertraline (ZOLOFT) 100 MG tablet Take 1 tablet (100 mg total) by mouth daily. 02/17/14  Yes Janith Lima, MD  simvastatin (ZOCOR) 80 MG tablet TAKE ONE-HALF TABLET BY MOUTH EVERY DAY AT BEDTIME Patient taking differently: Take 40 mg by mouth every evening.  06/30/13  Yes Janith Lima, MD  triamcinolone cream (KENALOG) 0.1 % Apply 1 application topically 2 (two) times daily.  05/20/13  Yes Historical Provider, MD  trospium (SANCTURA) 20 MG tablet Take 20 mg by mouth at bedtime.  02/04/13  Yes Historical Provider, MD  ALPRAZolam Duanne Moron) 0.5 MG tablet TAKE ONE TABLET BY MOUTH THREE TIMES DAILY AS NEEDED FOR SLEEP 08/25/13   Janith Lima, MD  Blood Glucose Monitoring Suppl (ACCU-CHEK AVIVA PLUS) W/DEVICE KIT 1 kit by Does not apply route 3 (three) times daily. 11/04/12   Janith Lima, MD  glucose blood (ACCU-CHEK AVIVA) test strip Test up to TID Dx: 250.02 06/30/13   Janith Lima, MD  Lancets (ACCU-CHEK SOFT [facility]) lancets Test up to TID Dx: 250.02 11/10/12   Janith Lima, MD  Multiple Vitamin (ONE-A-DAY MENS  PO) Take 1 tablet by mouth daily.     Historical Provider, MD     Results for orders placed or performed during the hospital encounter of 03/08/14 (from the past 48 hour(s))  Comprehensive metabolic panel     Status: Abnormal   Collection Time: 03/09/14  1:29 AM  Result Value Ref Range   Sodium 142 137 - 147 mEq/L   Potassium 4.0 3.7 - 5.3 mEq/L   Chloride 99 96 - 112 mEq/L   CO2 25 19 - 32 mEq/L   Glucose, Bld 139 (H) 70 - 99 mg/dL   BUN 43 (H) 6 - 23 mg/dL   Creatinine, Ser 1.67 (H) 0.50 - 1.35 mg/dL   Calcium 9.5 8.4 - 10.5 mg/dL   Total Protein 6.8 6.0 - 8.3 g/dL   Albumin 3.6 3.5 - 5.2 g/dL   AST 29 0 - 37 U/L   ALT 15 0 - 53 U/L   Alkaline Phosphatase 110 39 - 117 U/L   Total Bilirubin 0.4 0.3 - 1.2 mg/dL   GFR calc non Af Amer 35 (L) >90 mL/min   GFR calc Af Amer 41 (L) >90 mL/min    Comment: (NOTE) The eGFR has been calculated using the CKD EPI equation. This calculation has not been validated in all clinical situations. eGFR's persistently <90 mL/min signify possible Chronic Kidney Disease.    Anion gap 18 (H) 5 - 15  Lipase, blood     Status: None   Collection Time: 03/09/14  1:29 AM  Result Value Ref Range   Lipase 11 11 - 59 U/L  CBC with Differential     Status: Abnormal   Collection Time: 03/09/14  1:29 AM  Result Value Ref Range   WBC 8.4 4.0 - 10.5 K/uL   RBC 4.82 4.22 - 5.81 MIL/uL   Hemoglobin 14.2 13.0 - 17.0 g/dL   HCT 41.0 39.0 - 52.0 %   MCV 85.1 78.0 - 100.0 fL   MCH 29.5 26.0 - 34.0 pg   MCHC 34.6 30.0 - 36.0 g/dL   RDW 14.4 11.5 - 15.5 %   Platelets 171 150 - 400 K/uL   Neutrophils Relative % 87 (H) 43 - 77 %   Neutro Abs 7.3 1.7 - 7.7 K/uL   Lymphocytes Relative 6 (L) 12 - 46 %   Lymphs Abs 0.5 (L) 0.7 - 4.0 K/uL   Monocytes Relative 7 3 - 12 %   Monocytes Absolute 0.6 0.1 - 1.0 K/uL   Eosinophils  Relative 0 0 - 5 %   Eosinophils Absolute 0.0 0.0 - 0.7 K/uL   Basophils Relative 0 0 - 1 %   Basophils Absolute 0.0 0.0 - 0.1 K/uL   Ct  Abdomen Pelvis W Contrast  03/09/2014   CLINICAL DATA:  Nausea, vomiting and mid abdominal pain. Status post fall x2 03/08/2014.  EXAM: CT ABDOMEN AND PELVIS WITH CONTRAST  TECHNIQUE: Multidetector CT imaging of the abdomen and pelvis was performed using the standard protocol following bolus administration of intravenous contrast.  CONTRAST:  25 mL OMNIPAQUE IOHEXOL 300 MG/ML SOLN, 80 mL OMNIPAQUE IOHEXOL 300 MG/ML SOLN  COMPARISON:  CT abdomen and pelvis 11/11/2008.  FINDINGS: Calcific coronary artery disease is identified. There is a small to moderate pericardial effusion. Cardiomegaly is present. No pleural effusion. Dependent atelectasis is noted.  The patient has a high-grade small bowel obstruction with small bowel loops dilated up to 4.3 cm and multiple air-fluid levels identified. Transition point is seen in the left upper quadrant of the abdomen on image 32 where an angulated loop of small bowel is present. Distal small bowel loops are decompressed. There is no pneumatosis, portal venous gas, free intraperitoneal air or fluid collection. The patient is status post right hemicolectomy.  Right adrenal adenoma is unchanged. The left adrenal gland, spleen and biliary tree are unremarkable. There is a 1 cm diameter nonobstructing stone lower pole of the right kidney, unchanged. Tiny hypoattenuating lesion and in the lower pole of the left kidney is compatible with a cyst. Small flash filling hemangioma in the right hepatic lobe is unchanged. No lytic or sclerotic bony lesion is seen.  IMPRESSION: The study is positive for high-grade small bowel obstruction. Transition point is identified in the right upper quadrant of the abdomen and the obstruction appears to be due to adhesions.  1 cm nonobstructing stone lower pole right kidney, unchanged.  Status post right hemicolectomy.  Small to moderate pericardial effusion.  Cardiomegaly.  Calcific coronary artery disease.   Electronically Signed   By: Inge Rise M.D.   On: 03/09/2014 03:53    Review of Systems  Constitutional: Negative for weight loss.  HENT: Negative for ear discharge, ear pain, hearing loss and tinnitus.   Eyes: Negative for blurred vision, double vision, photophobia and pain.  Respiratory: Negative for cough, sputum production and shortness of breath.   Cardiovascular: Negative for chest pain.  Gastrointestinal: Positive for nausea, vomiting, abdominal pain and diarrhea.  Genitourinary: Negative for dysuria, urgency, frequency and flank pain.  Musculoskeletal: Negative for myalgias, back pain, joint pain, falls and neck pain.  Neurological: Negative for dizziness, tingling, sensory change, focal weakness, loss of consciousness and headaches.  Endo/Heme/Allergies: Does not bruise/bleed easily.  Psychiatric/Behavioral: Negative for depression, memory loss and substance abuse. The patient is not nervous/anxious.     Blood pressure 169/51, pulse 57, temperature 98.7 F (37.1 C), temperature source Oral, resp. rate 16, height 5' 5"  (1.651 m), weight 165 lb (74.844 kg), SpO2 100 %. Physical Exam  WDWN in NAD - elderly; resting comfortably HEENT:  EOMI, sclera anicteric Neck:  No masses, no thyromegaly Lungs:  CTA bilaterally; normal respiratory effort CV:  Regular rate and rhythm; no murmurs Abd:  +bowel sounds, slight abdominal distention, mild tenderness to palpation in RUQ Ext:  Well-perfused; no edema Skin:  Warm, dry; no sign of jaundice  Assessment/Plan Small bowel obstruction, likely secondary to adhesions from previous right hemicolectomy Mild dehydration Dementia DM2  Admit for bowel rest, IV hydration Will  hold off NG tube now, as SBO appears to be partial  Aiesha Leland K. 03/09/2014, 5:07 AM

## 2014-03-09 NOTE — ED Provider Notes (Signed)
CSN: [id number]     Arrival date & time 03/08/14  2338 History   First MD Initiated Contact with Patient 03/09/14 0023     Chief Complaint  Patient presents with  . Fall     (Consider location/radiation/quality/duration/timing/severity/associated sxs/prior Treatment) HPI 80 year old male presents to the emergency department from home with complaint of fall 2 today, nausea vomiting and diarrhea.  Fall was associated with having acute onset of loose stools and trying to get to the bathroom too quickly.  He denies any injury other than an abrasion to his left elbow.  Patient has some mild diffuse abdominal pain.  He denies any fever or chills.  No prior history of same.  Patient has history of hypertension, hyperlipidemia, dementia, colon cancer status post resection in 2006, prostate cancer status post TURP and radiation therapy Past Medical History  Diagnosis Date  . Hyperlipidemia   . Hypertension   . Dementia   . Anxiety associated with depression   . Urolithiasis   . History of cystoscopy     uretheral stricutre, 2005- Dr.Humphreys  . FTT (failure to thrive)     admited 08/2008  . Osteoarthritis     hands  . Colon cancer     s/p resection 12/06  . Prostate cancer     s/p XRT  . Diabetes mellitus     diet mgt (11`/12)  . Loss of hearing     being fitted for hearing aids (11/12)   Past Surgical History  Procedure Laterality Date  . Hemicolectomy  12/06  . Transurethral resection of prostate    . Xrt      ERT for prostate ca  . Cataract extraction, bilateral     Family History  Problem Relation Age of Onset  . Diabetes Daughter   . Cancer Neg Hx   . Early death Neg Hx   . Heart disease Neg Hx   . Hyperlipidemia Neg Hx   . Hypertension Neg Hx   . Kidney disease Neg Hx   . Stroke Neg Hx    History  Substance Use Topics  . Smoking status: Former Smoker    Quit date: 03/20/1967  . Smokeless tobacco: Never Used  . Alcohol Use: No     Comment: quit '68    Review  of Systems  See History of Present Illness; otherwise all other systems are reviewed and negative   Allergies  Review of patient's allergies indicates no known allergies.  Home Medications   Prior to Admission medications   Medication Sig Start Date End Date Taking? Authorizing Provider  ALPRAZolam Duanne Moron) 0.5 MG tablet Take 0.5 mg by mouth 3 (three) times daily as needed for anxiety.   Yes Historical Provider, MD  aspirin 81 MG tablet Take 81 mg by mouth daily.     Yes Historical Provider, MD  B Complex Vitamins (VITAMIN-B COMPLEX PO) Take 1 tablet by mouth daily.    Yes Historical Provider, MD  fesoterodine (TOVIAZ) 8 MG TB24 tablet Take 8 mg by mouth daily.    Yes Historical Provider, MD  hydrochlorothiazide (HYDRODIURIL) 25 MG tablet TAKE ONE TABLET BY MOUTH EVERY DAY IN THE MORNING Patient taking differently: Take 25 mg by mouth daily.  06/30/13  Yes Janith Lima, MD  HYDROcodone-acetaminophen (NORCO/VICODIN) 5-325 MG per tablet Take 1 tablet by mouth every 6 (six) hours as needed. Patient taking differently: Take 1 tablet by mouth every 6 (six) hours as needed for moderate pain.  01/07/14  Yes Thomas L  Ronnald Ramp, MD  losartan (COZAAR) 100 MG tablet Take 1 tablet (100 mg total) by mouth daily. 11/26/13  Yes Janith Lima, MD  Multiple Vitamin (MULTIVITAMIN WITH MINERALS) TABS tablet Take 1 tablet by mouth daily.   Yes Historical Provider, MD  olmesartan (BENICAR) 40 MG tablet Take 40 mg by mouth daily.    Yes Historical Provider, MD  omeprazole (PRILOSEC OTC) 20 MG tablet Take 20 mg by mouth daily.    Yes Historical Provider, MD  sertraline (ZOLOFT) 100 MG tablet Take 1 tablet (100 mg total) by mouth daily. 02/17/14  Yes Janith Lima, MD  simvastatin (ZOCOR) 80 MG tablet TAKE ONE-HALF TABLET BY MOUTH EVERY DAY AT BEDTIME Patient taking differently: Take 40 mg by mouth every evening.  06/30/13  Yes Janith Lima, MD  triamcinolone cream (KENALOG) 0.1 % Apply 1 application topically 2 (two)  times daily.  05/20/13  Yes Historical Provider, MD  trospium (SANCTURA) 20 MG tablet Take 20 mg by mouth at bedtime.  02/04/13  Yes Historical Provider, MD  ALPRAZolam Duanne Moron) 0.5 MG tablet TAKE ONE TABLET BY MOUTH THREE TIMES DAILY AS NEEDED FOR SLEEP 08/25/13   Janith Lima, MD  Blood Glucose Monitoring Suppl (ACCU-CHEK AVIVA PLUS) W/DEVICE KIT 1 kit by Does not apply route 3 (three) times daily. 11/04/12   Janith Lima, MD  glucose blood (ACCU-CHEK AVIVA) test strip Test up to TID Dx: 250.02 06/30/13   Janith Lima, MD  Lancets (ACCU-CHEK SOFT [facility]) lancets Test up to TID Dx: 250.02 11/10/12   Janith Lima, MD  Multiple Vitamin (ONE-A-DAY MENS PO) Take 1 tablet by mouth daily.     Historical Provider, MD   BP 160/54 mmHg  Pulse 61  Temp(Src) 98.7 F (37.1 C) (Oral)  Resp 16  Ht 5' 5"  (1.651 m)  Wt 165 lb (74.844 kg)  BMI 27.46 kg/m2  SpO2 100% Physical Exam  Constitutional: He is oriented to person, place, and time. He appears well-developed and well-nourished.  Frail elderly gentleman in no acute distress  HENT:  Head: Normocephalic and atraumatic.  Nose: Nose normal.  Mouth/Throat: Oropharynx is clear and moist.  Eyes: Conjunctivae and EOM are normal. Pupils are equal, round, and reactive to light.  Neck: Normal range of motion. Neck supple. No JVD present. No tracheal deviation present. No thyromegaly present.  Cardiovascular: Normal rate, regular rhythm, normal heart sounds and intact distal pulses.  Exam reveals no gallop and no friction rub.   No murmur heard. Pulmonary/Chest: Effort normal and breath sounds normal. No stridor. No respiratory distress. He has no wheezes. He has no rales. He exhibits no tenderness.  Abdominal: Soft. He exhibits distension. He exhibits no mass. There is tenderness. There is no rebound and no guarding.  Decreased bowel sounds.  Slight abdominal distention.  Mild tenderness to palpation in right abdomen  Musculoskeletal: Normal range of motion.  He exhibits no edema or tenderness.  Lymphadenopathy:    He has no cervical adenopathy.  Neurological: He is alert and oriented to person, place, and time. He displays normal reflexes. He exhibits normal muscle tone. Coordination normal.  Skin: Skin is warm and dry. No rash noted. No erythema. No pallor.  Psychiatric: He has a normal mood and affect. His behavior is normal. Judgment and thought content normal.  Nursing note and vitals reviewed.   ED Course  Procedures (including critical care time) Labs Review Labs Reviewed  COMPREHENSIVE METABOLIC PANEL - Abnormal; Notable for the following:  Glucose, Bld 139 (*)    BUN 43 (*)    Creatinine, Ser 1.67 (*)    GFR calc non Af Amer 35 (*)    GFR calc Af Amer 41 (*)    Anion gap 18 (*)    All other components within normal limits  CBC WITH DIFFERENTIAL - Abnormal; Notable for the following:    Neutrophils Relative % 87 (*)    Lymphocytes Relative 6 (*)    Lymphs Abs 0.5 (*)    All other components within normal limits  LIPASE, BLOOD  URINALYSIS, ROUTINE W REFLEX MICROSCOPIC    Imaging Review Ct Abdomen Pelvis W Contrast  03/09/2014   CLINICAL DATA:  Nausea, vomiting and mid abdominal pain. Status post fall x2 03/08/2014.  EXAM: CT ABDOMEN AND PELVIS WITH CONTRAST  TECHNIQUE: Multidetector CT imaging of the abdomen and pelvis was performed using the standard protocol following bolus administration of intravenous contrast.  CONTRAST:  25 mL OMNIPAQUE IOHEXOL 300 MG/ML SOLN, 80 mL OMNIPAQUE IOHEXOL 300 MG/ML SOLN  COMPARISON:  CT abdomen and pelvis 11/11/2008.  FINDINGS: Calcific coronary artery disease is identified. There is a small to moderate pericardial effusion. Cardiomegaly is present. No pleural effusion. Dependent atelectasis is noted.  The patient has a high-grade small bowel obstruction with small bowel loops dilated up to 4.3 cm and multiple air-fluid levels identified. Transition point is seen in the left upper quadrant of  the abdomen on image 32 where an angulated loop of small bowel is present. Distal small bowel loops are decompressed. There is no pneumatosis, portal venous gas, free intraperitoneal air or fluid collection. The patient is status post right hemicolectomy.  Right adrenal adenoma is unchanged. The left adrenal gland, spleen and biliary tree are unremarkable. There is a 1 cm diameter nonobstructing stone lower pole of the right kidney, unchanged. Tiny hypoattenuating lesion and in the lower pole of the left kidney is compatible with a cyst. Small flash filling hemangioma in the right hepatic lobe is unchanged. No lytic or sclerotic bony lesion is seen.  IMPRESSION: The study is positive for high-grade small bowel obstruction. Transition point is identified in the right upper quadrant of the abdomen and the obstruction appears to be due to adhesions.  1 cm nonobstructing stone lower pole right kidney, unchanged.  Status post right hemicolectomy.  Small to moderate pericardial effusion.  Cardiomegaly.  Calcific coronary artery disease.   Electronically Signed   By: Inge Rise M.D.   On: 03/09/2014 03:53     EKG Interpretation None      MDM   Final diagnoses:  Small bowel obstruction due to adhesions    80 year old male with nausea vomiting diarrhea today with 2 falls.  Family reports some constipation over the last few days.  Plan for labs, CT abdomen and pelvis  4:22 AM CT scan with high-grade small bowel obstruction with transition point in the right upper quadrant secondary to adhesions.  We'll touch base with surgery and possibly medicine.  Family and patient updated on findings and plan.    Kalman Drape, MD 03/09/14 [phone]

## 2014-03-09 NOTE — ED Notes (Signed)
Attempted report 

## 2014-03-09 NOTE — ED Notes (Signed)
Dr. Sharol Given back at the bedside.

## 2014-03-09 NOTE — ED Notes (Signed)
Dr. Tsuei at the bedside. 

## 2014-03-09 NOTE — ED Notes (Signed)
Pt vomiting.

## 2014-03-09 NOTE — ED Notes (Signed)
The patient is unable to give an urine specimen at this time. The tech has reported to the RN in charge. 

## 2014-03-09 NOTE — ED Notes (Signed)
Pt transported to CT ?

## 2014-03-10 ENCOUNTER — Inpatient Hospital Stay (HOSPITAL_COMMUNITY): Payer: Medicare HMO

## 2014-03-10 LAB — CBC
HEMATOCRIT: 34.1 % — AB (ref 39.0–52.0)
Hemoglobin: 11.9 g/dL — ABNORMAL LOW (ref 13.0–17.0)
MCH: 29.9 pg (ref 26.0–34.0)
MCHC: 34.9 g/dL (ref 30.0–36.0)
MCV: 85.7 fL (ref 78.0–100.0)
Platelets: 134 10*3/uL — ABNORMAL LOW (ref 150–400)
RBC: 3.98 MIL/uL — AB (ref 4.22–5.81)
RDW: 14.6 % (ref 11.5–15.5)
WBC: 4.8 10*3/uL (ref 4.0–10.5)

## 2014-03-10 LAB — GLUCOSE, CAPILLARY
GLUCOSE-CAPILLARY: 125 mg/dL — AB (ref 70–99)
GLUCOSE-CAPILLARY: 85 mg/dL (ref 70–99)
Glucose-Capillary: 57 mg/dL — ABNORMAL LOW (ref 70–99)
Glucose-Capillary: 70 mg/dL (ref 70–99)
Glucose-Capillary: 82 mg/dL (ref 70–99)
Glucose-Capillary: 89 mg/dL (ref 70–99)
Glucose-Capillary: 99 mg/dL (ref 70–99)

## 2014-03-10 LAB — BASIC METABOLIC PANEL
Anion gap: 11 (ref 5–15)
BUN: 34 mg/dL — AB (ref 6–23)
CALCIUM: 7.9 mg/dL — AB (ref 8.4–10.5)
CO2: 25 meq/L (ref 19–32)
Chloride: 110 mEq/L (ref 96–112)
Creatinine, Ser: 1.26 mg/dL (ref 0.50–1.35)
GFR calc Af Amer: 57 mL/min — ABNORMAL LOW (ref >90)
GFR calc non Af Amer: 49 mL/min — ABNORMAL LOW (ref >90)
Glucose, Bld: 66 mg/dL — ABNORMAL LOW (ref 70–99)
Potassium: 3.8 mEq/L (ref 3.7–5.3)
SODIUM: 146 meq/L (ref 137–147)

## 2014-03-10 MED ORDER — POTASSIUM CHLORIDE 2 MEQ/ML IV SOLN
INTRAVENOUS | Status: DC
  Administered 2014-03-10 – 2014-03-14 (×5): via INTRAVENOUS
  Filled 2014-03-10 (×11): qty 1000

## 2014-03-10 MED ORDER — GLUCOSE 40 % PO GEL: 1.0000 | Freq: Once | ORAL | Status: DC

## 2014-03-10 MED ORDER — DEXTROSE 50 % IV SOLN
25.0000 mL | Freq: Once | INTRAVENOUS | Status: AC | PRN
  Administered 2014-03-10: 25 mL via INTRAVENOUS

## 2014-03-10 MED ORDER — DEXTROSE 50 % IV SOLN
INTRAVENOUS | Status: AC
  Administered 2014-03-10: 25 g
  Filled 2014-03-10: qty 50

## 2014-03-10 NOTE — Progress Notes (Signed)
Patient ID: Russell [name], male   DOB: 12-May-1923, 80 y.o.   MRN: [id number]    Subjective: Pt feels ok today.  No flatus or BM.  Minimal abdominal pain  Objective: Vital signs in last 24 hours: Temp:  [98.3 F (36.8 C)-98.5 F (36.9 C)] 98.4 F (36.9 C) (11/11 0952) Pulse Rate:  [55-62] 58 (11/11 0952) Resp:  [16] 16 (11/11 0952) BP: (132-152)/(49-58) 132/58 mmHg (11/11 0952) SpO2:  [96 %-100 %] 98 % (11/11 0952) Last BM Date: 03/09/14  Intake/Output from previous day: 11/10 0701 - 11/11 0700 In: 1699.5 [I.V.:1699.5] Out: 825 [Urine:200; Emesis/NG output:500] Intake/Output this shift:    PE: Abd: soft, only mild soreness, few BS, NGT with some brownish output, ND Heart: regular Lungs: CTAB  Lab Results:   Recent Labs  03/09/14 0129 03/10/14 0415  WBC 8.4 4.8  HGB 14.2 11.9*  HCT 41.0 34.1*  PLT 171 134*   BMET  Recent Labs  03/09/14 0129 03/10/14 0415  NA 142 146  K 4.0 3.8  CL 99 110  CO2 25 25  GLUCOSE 139* 66*  BUN 43* 34*  CREATININE 1.67* 1.26  CALCIUM 9.5 7.9*   PT/INR No results for input(s): LABPROT, INR in the last 72 hours. CMP     Component Value Date/Time   NA 146 03/10/2014 0415   K 3.8 03/10/2014 0415   CL 110 03/10/2014 0415   CO2 25 03/10/2014 0415   GLUCOSE 66* 03/10/2014 0415   BUN 34* 03/10/2014 0415   CREATININE 1.26 03/10/2014 0415   CALCIUM 7.9* 03/10/2014 0415   PROT 6.8 03/09/2014 0129   ALBUMIN 3.6 03/09/2014 0129   AST 29 03/09/2014 0129   ALT 15 03/09/2014 0129   ALKPHOS 110 03/09/2014 0129   BILITOT 0.4 03/09/2014 0129   GFRNONAA 49* 03/10/2014 0415   GFRAA 57* 03/10/2014 0415   Lipase     Component Value Date/Time   LIPASE 11 03/09/2014 0129       Studies/Results: Ct Abdomen Pelvis W Contrast  03/09/2014   CLINICAL DATA:  Nausea, vomiting and mid abdominal pain. Status post fall x2 03/08/2014.  EXAM: CT ABDOMEN AND PELVIS WITH CONTRAST  TECHNIQUE: Multidetector CT imaging of the abdomen and pelvis was  performed using the standard protocol following bolus administration of intravenous contrast.  CONTRAST:  25 mL OMNIPAQUE IOHEXOL 300 MG/ML SOLN, 80 mL OMNIPAQUE IOHEXOL 300 MG/ML SOLN  COMPARISON:  CT abdomen and pelvis 11/11/2008.  FINDINGS: Calcific coronary artery disease is identified. There is a small to moderate pericardial effusion. Cardiomegaly is present. No pleural effusion. Dependent atelectasis is noted.  The patient has a high-grade small bowel obstruction with small bowel loops dilated up to 4.3 cm and multiple air-fluid levels identified. Transition point is seen in the left upper quadrant of the abdomen on image 32 where an angulated loop of small bowel is present. Distal small bowel loops are decompressed. There is no pneumatosis, portal venous gas, free intraperitoneal air or fluid collection. The patient is status post right hemicolectomy.  Right adrenal adenoma is unchanged. The left adrenal gland, spleen and biliary tree are unremarkable. There is a 1 cm diameter nonobstructing stone lower pole of the right kidney, unchanged. Tiny hypoattenuating lesion and in the lower pole of the left kidney is compatible with a cyst. Small flash filling hemangioma in the right hepatic lobe is unchanged. No lytic or sclerotic bony lesion is seen.  IMPRESSION: The study is positive for high-grade small bowel obstruction. Transition point is  identified in the right upper quadrant of the abdomen and the obstruction appears to be due to adhesions.  1 cm nonobstructing stone lower pole right kidney, unchanged.  Status post right hemicolectomy.  Small to moderate pericardial effusion.  Cardiomegaly.  Calcific coronary artery disease.   Electronically Signed   By: Inge Rise M.D.   On: 03/09/2014 03:53   Dg Abd 2 Views  03/10/2014   CLINICAL DATA:  Small bowel obstruction, NG tube  EXAM: ABDOMEN - 2 VIEW  COMPARISON:  03/09/2014  FINDINGS: Persistent gaseous distended small bowel loops in upper abdomen  consistent with partial bowel obstruction or ileus. Contrast material from recent CT scan noted in left colon and rectosigmoid colon. No free abdominal air. NG tube with tip in proximal stomach just below the GE junction. No free abdominal air.  IMPRESSION: Persistent gaseous distended small bowel loops in upper abdomen consistent with partial small bowel obstruction or ileus. No free abdominal air. NG tube with tip in proximal stomach.   Electronically Signed   By: Lahoma Crocker M.D.   On: 03/10/2014 10:06   Dg Abd Portable 2v  03/09/2014   CLINICAL DATA:  History of hemicolectomy. NG tube placement. History nausea, vomiting, abdominal pain.  EXAM: PORTABLE ABDOMEN - 2 VIEW  COMPARISON:  CT 03/09/2014.  FINDINGS: NG tube noted in the stomach. Surgical sutures are noted in the upper abdomen. Soft tissue structures are unremarkable. Contrast in the renal collecting system from prior CT. Persistent severely distended loops of small bowel consistent with persistent small-bowel obstruction . Small collection of air noted over the right upper abdomen on decubitus view. This is most likely within a nondistended loop of bowel and correlates with air within bowel on prior CT. No free air noted along the liver edge or under the hemidiaphragm. Thoracolumbar degenerative change. Changes of ankylosing spondylitis noted .  IMPRESSION: 1. NG tube in good anatomic position. 2. Persistent prominent distention of small bowel consistent with persistent small bowel obstruction.   Electronically Signed   By: Marcello Moores  Register   On: 03/09/2014 07:55    Anti-infectives: Anti-infectives    None       Assessment/Plan  1. PSBO 2. DM 3. HTN  Plan: 1. Hypoglycemic this am.  Switched IVFs to add some D5 as CBGs have been low 2. Abdominal films show contrast in the colon, but still with dilated small bowel and no bowel function.  Cont NGT and repeat films in the am.  3. lovenox/scds  LOS: 2 days    Zakirah Weingart  E 03/10/2014, 11:26 AM Pager: [phone]

## 2014-03-10 NOTE — Plan of Care (Signed)
Problem: Phase I Progression Outcomes Goal: Pain controlled with appropriate interventions Outcome: Completed/Met Date Met:  03/10/14 Goal: Voiding-avoid urinary catheter unless indicated Outcome: Completed/Met Date Met:  03/10/14 Goal: Hemodynamically stable Outcome: Adequate for Discharge  Problem: Phase II Progression Outcomes Goal: Vital signs remain stable Outcome: Completed/Met Date Met:  03/10/14 Goal: Obtain order to discontinue catheter if appropriate Outcome: Not Applicable Date Met:  14/23/95

## 2014-03-10 NOTE — Care Management (Signed)
Talked to patient and patient's daughter Shea Evans [phone] at bedside . Patient currently has an aide at home 4 hours / week for preparation of meal and light house keeping . Daughter requesting increase in hours to help patient bath daily .   Called patient's social services case worker Lawerance Sabal [phone] , left voice mail. [place] [phone] ( agency patient's aide is through ) , spoke to [place] . Denman George will call Elayne Guerin at [facility] who authorizes patient's aide and hours , and make her aware . Elayne Guerin will be in contact with patient's family .   Patient and his daughter aware .   Magdalen Spatz RN BSN

## 2014-03-10 NOTE — Progress Notes (Signed)
On call notified of patient having a blood sugar of 57. Awaiting response. Hypoglycemia protocol initiated. Will continue to monitor

## 2014-03-11 ENCOUNTER — Inpatient Hospital Stay (HOSPITAL_COMMUNITY): Payer: Medicare HMO

## 2014-03-11 LAB — GLUCOSE, CAPILLARY
GLUCOSE-CAPILLARY: 94 mg/dL (ref 70–99)
GLUCOSE-CAPILLARY: 91 mg/dL (ref 70–99)
GLUCOSE-CAPILLARY: 66 mg/dL — AB (ref 70–99)
GLUCOSE-CAPILLARY: 123 mg/dL — AB (ref 70–99)
Glucose-Capillary: 80 mg/dL (ref 70–99)
Glucose-Capillary: 100 mg/dL — ABNORMAL HIGH (ref 70–99)
Glucose-Capillary: 85 mg/dL (ref 70–99)

## 2014-03-11 MED ORDER — DEXTROSE 50 % IV SOLN
INTRAVENOUS | Status: AC
  Filled 2014-03-11: qty 50

## 2014-03-11 NOTE — Progress Notes (Signed)
Patient ID: Russell [name], male   DOB: February 25, 1924, 80 y.o.   MRN: [id number]    Subjective: Pt feels well today.  No complaints.  Says he's passing flatus, but no stool.  No abdominal pain.  That has completely resolved  Objective: Vital signs in last 24 hours: Temp:  [98 F (36.7 C)-98.7 F (37.1 C)] 98 F (36.7 C) 2023-04-09 0603) Pulse Rate:  [55-62] 58 2023-04-09 0603) Resp:  [16-17] 17 04-09-2023 0603) BP: (132-152)/(54-58) 140/58 mmHg Apr 09, 2023 0603) SpO2:  [98 %-100 %] 99 % 04-09-23 0603) Last BM Date: 03/09/14  Intake/Output from previous day: 11/11 0701 - 04-09-23 0700 In: 907 [I.V.:907] Out: 1550 [Urine:1275; Emesis/NG output:275] Intake/Output this shift:    PE: Abd: soft, NT, ND, +BS, NGT with only 275cc in 24 hours,  Heart: regular Lungs: CTAB  Lab Results:   Recent Labs  03/09/14 0129 03/10/14 0415  WBC 8.4 4.8  HGB 14.2 11.9*  HCT 41.0 34.1*  PLT 171 134*   BMET  Recent Labs  03/09/14 0129 03/10/14 0415  NA 142 146  K 4.0 3.8  CL 99 110  CO2 25 25  GLUCOSE 139* 66*  BUN 43* 34*  CREATININE 1.67* 1.26  CALCIUM 9.5 7.9*   PT/INR No results for input(s): LABPROT, INR in the last 72 hours. CMP     Component Value Date/Time   NA 146 03/10/2014 0415   K 3.8 03/10/2014 0415   CL 110 03/10/2014 0415   CO2 25 03/10/2014 0415   GLUCOSE 66* 03/10/2014 0415   BUN 34* 03/10/2014 0415   CREATININE 1.26 03/10/2014 0415   CALCIUM 7.9* 03/10/2014 0415   PROT 6.8 03/09/2014 0129   ALBUMIN 3.6 03/09/2014 0129   AST 29 03/09/2014 0129   ALT 15 03/09/2014 0129   ALKPHOS 110 03/09/2014 0129   BILITOT 0.4 03/09/2014 0129   GFRNONAA 49* 03/10/2014 0415   GFRAA 57* 03/10/2014 0415   Lipase     Component Value Date/Time   LIPASE 11 03/09/2014 0129       Studies/Results: Dg Abd 2 Views  04/08/14   CLINICAL DATA:  Abdominal pain.  Small bowel obstruction.  EXAM: ABDOMEN - 2 VIEW  COMPARISON:  03/10/2014  FINDINGS: Nasogastric tube noted in the stomach. Distal  colonic contrast medium. Anastomotic staple line in the right abdomen. Air- fluid levels in a mildly dilated loop of small bowel in the right abdomen near the anastomotic staple line. Mildly dilated central abdominal loop of small bowel, 4.6 cm, formerly the same.  Stable lumbar spondylosis and degenerative arthropathy of both hips.  IMPRESSION: 1. Stable appearance of mildly dilated central and right abdominal loops of small bowel, in an abnormal but nonspecific appearance which could favor ileus or obstruction. There is contrast medium in the colon.   Electronically Signed   By: Sherryl Barters M.D.   On: 04/08/2014 09:22   Dg Abd 2 Views  03/10/2014   CLINICAL DATA:  Small bowel obstruction, NG tube  EXAM: ABDOMEN - 2 VIEW  COMPARISON:  03/09/2014  FINDINGS: Persistent gaseous distended small bowel loops in upper abdomen consistent with partial bowel obstruction or ileus. Contrast material from recent CT scan noted in left colon and rectosigmoid colon. No free abdominal air. NG tube with tip in proximal stomach just below the GE junction. No free abdominal air.  IMPRESSION: Persistent gaseous distended small bowel loops in upper abdomen consistent with partial small bowel obstruction or ileus. No free abdominal air. NG tube with tip in  proximal stomach.   Electronically Signed   By: Lahoma Crocker M.D.   On: 03/10/2014 10:06    Anti-infectives: Anti-infectives    None       Assessment/Plan  1. PSBO  Plan: 1. Abdominal films look a little better today.  Still with a little small bowel distention, but plenty of air and contrast in his colon.  Will clamp his NGT today and see how he does.   LOS: 3 days    Adalynn Corne E 03/11/2014, 9:39 AM Pager: [phone]

## 2014-03-12 LAB — GLUCOSE, CAPILLARY
GLUCOSE-CAPILLARY: 86 mg/dL (ref 70–99)
GLUCOSE-CAPILLARY: 72 mg/dL (ref 70–99)
GLUCOSE-CAPILLARY: 69 mg/dL — AB (ref 70–99)
GLUCOSE-CAPILLARY: 147 mg/dL — AB (ref 70–99)
GLUCOSE-CAPILLARY: 124 mg/dL — AB (ref 70–99)

## 2014-03-12 MED ORDER — MORPHINE SULFATE 2 MG/ML IJ SOLN: 2.0000 mg | INTRAMUSCULAR | Status: DC | PRN

## 2014-03-12 MED ORDER — ENOXAPARIN SODIUM 40 MG/0.4ML ~~LOC~~ SOLN
40.0000 mg | SUBCUTANEOUS | Status: DC
  Administered 2014-03-12 – 2014-03-14 (×3): 40 mg via SUBCUTANEOUS
  Filled 2014-03-12 (×4): qty 0.4

## 2014-03-12 NOTE — Progress Notes (Signed)
  Subjective: No c/o. No n/v. Tolerated NG clamp. Reports flatus. Denies pain  Objective: Vital signs in last 24 hours: Temp:  [98.2 F (36.8 C)-98.4 F (36.9 C)] 98.2 F (36.8 C) (11/13 0532) Pulse Rate:  [49-61] 49 (11/13 0532) Resp:  [16] 16 (11/13 0532) BP: (144-153)/(45-58) 150/45 mmHg (11/13 0532) SpO2:  [97 %-99 %] 97 % (11/13 0532) Last BM Date: 03/09/14  Intake/Output from previous day: Mar 25, 2023 0701 - 11/13 0700 In: 0  Out: 600 [Urine:600] Intake/Output this shift:    Alert, nad cta b/l Reg Soft, nt, nd, No edema Sitting in chair; appropriate  Lab Results:   Recent Labs  03/10/14 0415  WBC 4.8  HGB 11.9*  HCT 34.1*  PLT 134*   BMET  Recent Labs  03/10/14 0415  NA 146  K 3.8  CL 110  CO2 25  GLUCOSE 66*  BUN 34*  CREATININE 1.26  CALCIUM 7.9*   PT/INR No results for input(s): LABPROT, INR in the last 72 hours. ABG No results for input(s): PHART, HCO3 in the last 72 hours.  Invalid input(s): PCO2, PO2  Studies/Results: Dg Abd 2 Views  24-Mar-2014   CLINICAL DATA:  Abdominal pain.  Small bowel obstruction.  EXAM: ABDOMEN - 2 VIEW  COMPARISON:  03/10/2014  FINDINGS: Nasogastric tube noted in the stomach. Distal colonic contrast medium. Anastomotic staple line in the right abdomen. Air- fluid levels in a mildly dilated loop of small bowel in the right abdomen near the anastomotic staple line. Mildly dilated central abdominal loop of small bowel, 4.6 cm, formerly the same.  Stable lumbar spondylosis and degenerative arthropathy of both hips.  IMPRESSION: 1. Stable appearance of mildly dilated central and right abdominal loops of small bowel, in an abnormal but nonspecific appearance which could favor ileus or obstruction. There is contrast medium in the colon.   Electronically Signed   By: Sherryl Barters M.D.   On: 03-24-14 09:22   Dg Abd 2 Views  03/10/2014   CLINICAL DATA:  Small bowel obstruction, NG tube  EXAM: ABDOMEN - 2 VIEW  COMPARISON:   03/09/2014  FINDINGS: Persistent gaseous distended small bowel loops in upper abdomen consistent with partial bowel obstruction or ileus. Contrast material from recent CT scan noted in left colon and rectosigmoid colon. No free abdominal air. NG tube with tip in proximal stomach just below the GE junction. No free abdominal air.  IMPRESSION: Persistent gaseous distended small bowel loops in upper abdomen consistent with partial small bowel obstruction or ileus. No free abdominal air. NG tube with tip in proximal stomach.   Electronically Signed   By: Lahoma Crocker M.D.   On: 03/10/2014 10:06    Anti-infectives: Anti-infectives    None      Assessment/Plan: Active Problems:   Small bowel obstruction due to adhesions HTN  Dc ng tube Clear liquid Repeat labs in am Cont VTE prophylaxis OOB  Leighton Ruff. Redmond Pulling, MD, FACS General, Bariatric, & Minimally Invasive Surgery [facility], [place]    LOS: 4 days    Gayland Curry 03/12/2014

## 2014-03-12 NOTE — Care Management Note (Signed)
  Page 2 of 2   03/15/2014     11:57:43 AM CARE MANAGEMENT NOTE 03/15/2014  Patient:  Russell [name], Russell [name]   Account Number:  [id number]  Date Initiated:  03/12/2014  Documentation initiated by:  Magdalen Spatz  Subjective/Objective Assessment:     Action/Plan:   Anticipated DC Date:  03/15/2014   Anticipated DC Plan:  [place]         Choice offered to / List presented to:  C-4 Adult Children   DME arranged  [place]      DME agency  [place]     HH arranged  HH-2 PT      [place].   Status of service:  Completed, signed off Medicare Important Message given?  YES (If response is "NO", the following Medicare IM given date fields will be blank) Date Medicare IM given:  03/12/2014 Medicare IM given by:  Magdalen Spatz Date Additional Medicare IM given:  03/15/2014 Additional Medicare IM given by:  Magdalen Spatz  Discharge Disposition:  [place]  Per UR Regulation:  Reviewed for med. necessity/level of care/duration of stay  If discussed at [place] of Stay Meetings, dates discussed:    Comments:  03-15-14 Spoke to patient at bedside received consent to call his daughter [name] to discuss discharge plan.  Spoke with [phone]  Patient's address is [address] , [place] , [id number] .  Ms Lenor Coffin would like [facility] to call her regarding delivery and [place] to call her to arrange visits . [facility] agencies aware and have Ms Hurbin's phone number and patient's correct address.  Information faxed to [place] RN BSN [phone]

## 2014-03-12 NOTE — Evaluation (Signed)
Physical Therapy Evaluation Patient Details Name: Russell [name] MRN: [id number] DOB: Mar 25, 1924 Today's Date: 03/12/2014   History of Present Illness  Pt is a 80 y.o. male adm due to small bowl obstruction. NG tube removed 03/12/14.  Clinical Impression  Patient is s/p above surgery resulting in functional limitations due to the deficits listed below (see PT Problem List).  Patient will benefit from skilled PT to increase their independence and safety with mobility to allow discharge to the venue listed below. Family very involved with planning and (A) provided at home. Pt will benefit from HHPT upon returning home due to generalized weakness and balance deficits. Patient needs to practice stairs next session prior to D/C home.    Follow Up Recommendations Home health PT;Supervision/Assistance - 24 hour    Equipment Recommendations  Rolling walker with 5" wheels    Recommendations for Other Services OT consult     Precautions / Restrictions Precautions Precautions: Fall Precaution Comments: pt with fall on day of adm  Restrictions Weight Bearing Restrictions: No      Mobility  Bed Mobility               General bed mobility comments: pt up in chair   Transfers Overall transfer level: Needs assistance Equipment used: Rolling walker (2 wheeled) Transfers: Sit to/from Stand Sit to Stand: Min assist         General transfer comment: (A) to power up and achieve standing position ; cues for hand placement and safety with transfers  Ambulation/Gait Ambulation/Gait assistance: Min assist Ambulation Distance (Feet): 300 Feet Assistive device: Rolling walker (2 wheeled) Gait Pattern/deviations: Decreased stride length;Step-through pattern;Shuffle;Drifts right/left;Narrow base of support Gait velocity: decreased Gait velocity interpretation: Below normal speed for age/gender General Gait Details: pt with difficulty managing RW and running into obstacles prmarily on Rt  hand side of hallway throughout mobilization; min (A) to manage RW and balance; cues for safety and technique with RW  Stairs            Wheelchair Mobility    Modified Rankin (Stroke Patients Only)       Balance Overall balance assessment: Needs assistance;History of Falls Sitting-balance support: Feet supported;No upper extremity supported Sitting balance-Leahy Scale: Fair     Standing balance support: During functional activity;Bilateral upper extremity supported Standing balance-Leahy Scale: Poor Standing balance comment: (A) and RW to balance                             Pertinent Vitals/Pain Pain Assessment: No/denies pain    Home Living Family/patient expects to be discharged to:: Private residence Living Arrangements: Children Available Help at Discharge: Family;Available 24 hours/day;Personal care attendant Type of Home: House Home Access: Stairs to enter Entrance Stairs-Rails: Right Entrance Stairs-Number of Steps: 3-4 Home Layout: One level Home Equipment: Cane - single point Additional Comments: pt has [place] aide comes in 4 hours/ week for cleaning and cooking     Prior Function Level of Independence: Independent with assistive device(s);Needs assistance   Gait / Transfers Assistance Needed: ambulates with cane   ADL's / Homemaking Assistance Needed: [place] aide comes in 4 hours/ week to (A) with cleaning and cooking ; PTA pt was independent with all ADLs        Hand Dominance        Extremity/Trunk Assessment   Upper Extremity Assessment: Defer to OT evaluation           Lower Extremity Assessment: Generalized  weakness      Cervical / Trunk Assessment: Normal  Communication   Communication: HOH  Cognition Arousal/Alertness: Awake/alert Behavior During Therapy: WFL for tasks assessed/performed Overall Cognitive Status: Within Functional Limits for tasks assessed                      General Comments General comments  (skin integrity, edema, etc.): discussed D/C recommendations with pt and family    Exercises        Assessment/Plan    PT Assessment Patient needs continued PT services  PT Diagnosis Difficulty walking;Generalized weakness;Acute pain   PT Problem List Decreased strength;Decreased activity tolerance;Decreased balance;Decreased safety awareness;Decreased mobility  PT Treatment Interventions DME instruction;Gait training;Stair training;Functional mobility training;Therapeutic activities;Balance training;Therapeutic exercise;Neuromuscular re-education;Patient/family education   PT Goals (Current goals can be found in the Care Plan section) Acute Rehab PT Goals Patient Stated Goal: to go home soon PT Goal Formulation: With patient Time For Goal Achievement: 03/19/14 Potential to Achieve Goals: Good    Frequency Min 3X/week   Barriers to discharge        Co-evaluation               End of Session Equipment Utilized During Treatment: Gait belt Activity Tolerance: Patient tolerated treatment well Patient left: in chair;with call bell/phone within reach;with family/visitor present Nurse Communication: Mobility status         Time: 0092-3300 PT Time Calculation (min) (ACUTE ONLY): 19 min   Charges:   PT Evaluation $Initial PT Evaluation Tier I: 1 Procedure PT Treatments $Gait Training: 8-22 mins   PT G CodesGustavus Bryant, [employer] 03/12/2014, 3:03 PM

## 2014-03-13 LAB — BASIC METABOLIC PANEL
ANION GAP: 13 (ref 5–15)
BUN: 15 mg/dL (ref 6–23)
CO2: 21 mEq/L (ref 19–32)
Calcium: 7.7 mg/dL — ABNORMAL LOW (ref 8.4–10.5)
Chloride: 106 mEq/L (ref 96–112)
Creatinine, Ser: 0.97 mg/dL (ref 0.50–1.35)
GFR, EST AFRICAN AMERICAN: 83 mL/min — AB (ref >90)
GFR, EST NON AFRICAN AMERICAN: 72 mL/min — AB (ref >90)
Glucose, Bld: 75 mg/dL (ref 70–99)
POTASSIUM: 4.1 meq/L (ref 3.7–5.3)
SODIUM: 140 meq/L (ref 137–147)

## 2014-03-13 LAB — GLUCOSE, CAPILLARY
GLUCOSE-CAPILLARY: 132 mg/dL — AB (ref 70–99)
Glucose-Capillary: 72 mg/dL (ref 70–99)
Glucose-Capillary: 75 mg/dL (ref 70–99)
Glucose-Capillary: 77 mg/dL (ref 70–99)

## 2014-03-13 MED ORDER — BISACODYL 10 MG RE SUPP
10.0000 mg | Freq: Two times a day (BID) | RECTAL | Status: AC
  Administered 2014-03-13 (×2): 10 mg via RECTAL
  Filled 2014-03-13 (×3): qty 1

## 2014-03-13 MED ORDER — PANTOPRAZOLE SODIUM 40 MG PO TBEC
40.0000 mg | DELAYED_RELEASE_TABLET | Freq: Two times a day (BID) | ORAL | Status: DC
  Administered 2014-03-13 – 2014-03-15 (×4): 40 mg via ORAL
  Filled 2014-03-13 (×4): qty 1

## 2014-03-13 NOTE — Progress Notes (Signed)
  Subjective: Awake and alert. No distress. Not a great historian, but 1020 mL by mouth intake recorded yesterday without nausea or vomiting. Says he is passing flatus B-met looks good.  Objective: Vital signs in last 24 hours: Temp:  [98.1 F (36.7 C)-98.5 F (36.9 C)] 98.5 F (36.9 C) (11/14 0602) Pulse Rate:  [58-73] 58 (11/14 0602) Resp:  [15-16] 15 (11/14 0602) BP: (132-134)/(50-62) 132/60 mmHg (11/14 0602) SpO2:  [97 %-100 %] 97 % (11/14 0602) Last BM Date: 03/12/14  Intake/Output from previous day: 11/13 0701 - 11/14 0700 In: 1020 [P.O.:1020] Out: 1750 [Urine:1750] Intake/Output this shift:    General appearance: alert. Friendly. No distress. GI: abdomen is soft. Nontender.hypoactive bowel sounds.  Lab Results:  No results for input(s): WBC, HGB, HCT, PLT in the last 72 hours. BMET  Recent Labs  03/13/14 0046  NA 140  K 4.1  CL 106  CO2 21  GLUCOSE 75  BUN 15  CREATININE 0.97  CALCIUM 7.7*   PT/INR No results for input(s): LABPROT, INR in the last 72 hours. ABG No results for input(s): PHART, HCO3 in the last 72 hours.  Invalid input(s): PCO2, PO2  Studies/Results: Dg Abd 2 Views  28-Mar-2014   CLINICAL DATA:  Abdominal pain.  Small bowel obstruction.  EXAM: ABDOMEN - 2 VIEW  COMPARISON:  03/10/2014  FINDINGS: Nasogastric tube noted in the stomach. Distal colonic contrast medium. Anastomotic staple line in the right abdomen. Air- fluid levels in a mildly dilated loop of small bowel in the right abdomen near the anastomotic staple line. Mildly dilated central abdominal loop of small bowel, 4.6 cm, formerly the same.  Stable lumbar spondylosis and degenerative arthropathy of both hips.  IMPRESSION: 1. Stable appearance of mildly dilated central and right abdominal loops of small bowel, in an abnormal but nonspecific appearance which could favor ileus or obstruction. There is contrast medium in the colon.   Electronically Signed   By: Sherryl Barters M.D.    On: 03/28/2014 09:22    Anti-infectives: Anti-infectives    None      Assessment/Plan:  Partial SBO due to adhesions. Slowly resolving. Advance to full liquids Progressive ambulation Continue VTE prophylaxis  Hypertension.continue oral medication regimen   Dementia Diabetes mellitus, on sliding scale insulin   history colon cancer History prostate cancer     LOS: 5 days    Ambers Iyengar M 03/13/2014

## 2014-03-14 LAB — GLUCOSE, CAPILLARY
GLUCOSE-CAPILLARY: 146 mg/dL — AB (ref 70–99)
GLUCOSE-CAPILLARY: 74 mg/dL (ref 70–99)
GLUCOSE-CAPILLARY: 88 mg/dL (ref 70–99)
GLUCOSE-CAPILLARY: 99 mg/dL (ref 70–99)
Glucose-Capillary: 123 mg/dL — ABNORMAL HIGH (ref 70–99)
Glucose-Capillary: 217 mg/dL — ABNORMAL HIGH (ref 70–99)
Glucose-Capillary: 63 mg/dL — ABNORMAL LOW (ref 70–99)
Glucose-Capillary: 83 mg/dL (ref 70–99)
Glucose-Capillary: 92 mg/dL (ref 70–99)
Glucose-Capillary: 97 mg/dL (ref 70–99)

## 2014-03-14 NOTE — Plan of Care (Signed)
Problem: Phase II Progression Outcomes Goal: Progress activity as tolerated unless otherwise ordered Outcome: Completed/Met Date Met:  03/14/14     

## 2014-03-14 NOTE — Progress Notes (Signed)
  Subjective: Awake and alert. Pleasant. Not agitated. Poor historian. Tolerating liquid diet. has begun to have bowel movements. Denies nausea vomiting or abdominal pain.  Objective: Vital signs in last 24 hours: Temp:  [97.5 F (36.4 C)-98.3 F (36.8 C)] 98.3 F (36.8 C) (11/15 0609) Pulse Rate:  [66-72] 66 (11/15 0609) Resp:  [16-20] 16 (11/15 0609) BP: (129-161)/(50-70) 129/50 mmHg (11/15 0609) SpO2:  [96 %-100 %] 96 % (11/15 0609) Last BM Date: 03/12/14  Intake/Output from previous day: 11/14 0701 - 11/15 0700 In: 480 [P.O.:480] Out: 2975 [Urine:2975] Intake/Output this shift:    General appearance: alert. Cooperative. Friendly. No distress. GI: abnormal findings:  abdomen is soft and nontender. Active bowel sounds. Not distended. No hernias detected.  Lab Results:  No results for input(s): WBC, HGB, HCT, PLT in the last 72 hours. BMET  Recent Labs  03/13/14 0046  NA 140  K 4.1  CL 106  CO2 21  GLUCOSE 75  BUN 15  CREATININE 0.97  CALCIUM 7.7*   PT/INR No results for input(s): LABPROT, INR in the last 72 hours. ABG No results for input(s): PHART, HCO3 in the last 72 hours.  Invalid input(s): PCO2, PO2  Studies/Results: No results found.  Anti-infectives: Anti-infectives    None      Assessment/Plan:  Partial SBO due to adhesions.  resolving. Advance to soft diet Progressive ambulation Hopefully home tomorrow  Continue VTE prophylaxis  Hypertension.Controlled.continue oral medication regimen  Dementia-CM involved.  PT involved---RW with 5" wheels recommended    Current plan is discharge home with a. Hoping to increase 8 hours. Discussing with family. Diabetes mellitus, on sliding scale insulin. CBGs generally controlled.  history colon cancer History prostate cancer   LOS: 6 days    Essynce Munsch M 03/14/2014

## 2014-03-14 NOTE — Plan of Care (Signed)
Problem: Phase I Progression Outcomes Goal: OOB as tolerated unless otherwise ordered Outcome: Completed/Met Date Met:  03/14/14 Sat in chair and walked in hallway with walker and one assist

## 2014-03-15 LAB — GLUCOSE, CAPILLARY
GLUCOSE-CAPILLARY: 85 mg/dL (ref 70–99)
GLUCOSE-CAPILLARY: 79 mg/dL (ref 70–99)
GLUCOSE-CAPILLARY: 114 mg/dL — AB (ref 70–99)

## 2014-03-15 NOTE — Plan of Care (Signed)
Problem: Discharge Progression Outcomes Goal: Discharge plan in place and appropriate Outcome: Completed/Met Date Met:  03/15/14 Goal: Pain controlled with appropriate interventions Outcome: Completed/Met Date Met:  03/15/14 Goal: Tolerating diet Outcome: Adequate for Discharge Goal: Activity appropriate for discharge plan Outcome: Adequate for Discharge

## 2014-03-15 NOTE — Discharge Summary (Signed)
Patient ID: Russell [name] MRN: [id number] DOB/AGE: Oct 31, 1923 80 y.o.  Admit date: 03/08/2014 Discharge date: 03/15/2014  Procedures: none  Consults: None  Reason for Admission: 80 yo male with dementia presents with nausea, vomiting, abdominal distention. He had two episodes of loose stools earlier today. He had a slight fall today and just had an abrasion to his left elbow. He is status post right hemicolectomy for colon cancer in 2006 by Dr. Zettie Pho.  Admission Diagnoses:  1. SBO Patient Active Problem List   Diagnosis Date Noted  . Small bowel obstruction due to adhesions 03/09/2014  . Acute combined systolic and diastolic heart failure - EF 42% on echo 12/2013- medical therapy 01/25/2014  . DJD (degenerative joint disease) 02/20/2013  . Bradycardia 01/21/2012  . Major depressive disorder, recurrent episode, severe, without mention of psychotic behavior 08/08/2011  . PVC's (premature ventricular contractions) 01/31/2009  . DEMENTIA 08/03/2008  . URINARY INCONTINENCE 07/16/2008  . ADENOCARCINOMA, COLON 05/13/2006  . ADENOCARCINOMA, PROSTATE 05/13/2006  . Hyperlipidemia with target LDL less than 100 05/13/2006  . Essential hypertension, benign 05/13/2006    Hospital Course: The patient was admitted and an NGT was placed for decompression.  He continues to have some dilated bowel, but began passing some flatus on HD2.  His NGT was clamped on HD 3 and removed on day 4.  Hid diet was then able to be advanced as tolerated.  He did have some deconditioning and HH PT was ordered.  The patient was stable and tolerating a regular soft diet on HD 7 and ready for dc home.  PE: Abd: soft, NT, ND, +BS, incisional hernia is soft and reducible.  Discharge Diagnoses:  Active Problems:   Small bowel obstruction due to adhesions  Patient Active Problem List   Diagnosis Date Noted  . Small bowel obstruction due to adhesions 03/09/2014  . Acute combined systolic and diastolic heart failure -  EF 42% on echo 12/2013- medical therapy 01/25/2014  . DJD (degenerative joint disease) 02/20/2013  . Bradycardia 01/21/2012  . Major depressive disorder, recurrent episode, severe, without mention of psychotic behavior 08/08/2011  . PVC's (premature ventricular contractions) 01/31/2009  . DEMENTIA 08/03/2008  . URINARY INCONTINENCE 07/16/2008  . ADENOCARCINOMA, COLON 05/13/2006  . ADENOCARCINOMA, PROSTATE 05/13/2006  . Hyperlipidemia with target LDL less than 100 05/13/2006  . Essential hypertension, benign 05/13/2006    Discharge Medications:   Medication List    TAKE these medications        ACCU-CHEK AVIVA PLUS W/DEVICE Kit  1 kit by Does not apply route 3 (three) times daily.     accu-chek soft touch lancets  Test up to TID Dx: 250.02     ALPRAZolam 0.5 MG tablet  Commonly known as:  XANAX  Take 0.5 mg by mouth 3 (three) times daily as needed for anxiety.     ALPRAZolam 0.5 MG tablet  Commonly known as:  XANAX  TAKE ONE TABLET BY MOUTH THREE TIMES DAILY AS NEEDED FOR SLEEP     aspirin 81 MG tablet  Take 81 mg by mouth daily.     glucose blood test strip  Commonly known as:  ACCU-CHEK AVIVA  Test up to TID Dx: 250.02     hydrochlorothiazide 25 MG tablet  Commonly known as:  HYDRODIURIL  TAKE ONE TABLET BY MOUTH EVERY DAY IN THE MORNING     HYDROcodone-acetaminophen 5-325 MG per tablet  Commonly known as:  NORCO/VICODIN  Take 1 tablet by mouth every 6 (six) hours as needed.  losartan 100 MG tablet  Commonly known as:  COZAAR  Take 1 tablet (100 mg total) by mouth daily.     multivitamin with minerals Tabs tablet  Take 1 tablet by mouth daily.     olmesartan 40 MG tablet  Commonly known as:  BENICAR  Take 40 mg by mouth daily.     omeprazole 20 MG tablet  Commonly known as:  PRILOSEC OTC  Take 20 mg by mouth daily.     ONE-A-DAY MENS PO  Take 1 tablet by mouth daily.     sertraline 100 MG tablet  Commonly known as:  ZOLOFT  Take 1 tablet (100 mg  total) by mouth daily.     simvastatin 80 MG tablet  Commonly known as:  ZOCOR  TAKE ONE-HALF TABLET BY MOUTH EVERY DAY AT BEDTIME     TOVIAZ 8 MG Tb24 tablet  Generic drug:  fesoterodine  Take 8 mg by mouth daily.     triamcinolone cream 0.1 %  Commonly known as:  KENALOG  Apply 1 application topically 2 (two) times daily.     trospium 20 MG tablet  Commonly known as:  SANCTURA  Take 20 mg by mouth at bedtime.     VITAMIN-B COMPLEX PO  Take 1 tablet by mouth daily.        Discharge Instructions:     Follow-up Information    Follow up with Scarlette Calico, MD. Schedule an appointment as soon as possible for a visit in 1 week.   Specialty:  Internal Medicine   Contact information:   [address] [address] [phone]       Signed: Henreitta Cea 03/15/2014, 10:38 AM

## 2014-03-15 NOTE — Discharge Instructions (Signed)
Small Bowel Obstruction °A small bowel obstruction is a blockage (obstruction) of the small intestine (small bowel). The small bowel is a long, slender tube that connects the stomach to the colon. Its job is to absorb nutrients from the fluids and foods you consume into the bloodstream.  °CAUSES  °There are many causes of intestinal blockage. The most common ones include: °· Hernias. This is a more common cause in children than adults. °· Inflammatory bowel disease (enteritis and colitis). °· Twisting of the bowel (volvulus). °· Tumors. °· Scar tissue (adhesions) from previous surgery or radiation treatment. °· Recent surgery. This may cause an acute small bowel obstruction called an ileus. °SYMPTOMS  °· Abdominal pain. This may be dull cramps or sharp pain. It may occur in one area or may be present in the entire abdomen. Pain can range from mild to severe, depending on the degree of obstruction. °· Nausea and vomiting. Vomit may be greenish or yellow bile color. °· Distended or swollen stomach. Abdominal bloating is a common symptom. °· Constipation. °· Lack of passing gas. °· Frequent belching. °· Diarrhea. This may occur if runny stool is able to leak around the obstruction. °DIAGNOSIS  °Your caregiver can usually diagnose small bowel obstruction by taking a history, doing a physical exam, and taking X-rays. If the cause is unclear, a CT scan (computerized tomography) of your abdomen and pelvis may be needed. °TREATMENT  °Treatment of the blockage depends on the cause and how bad the problem is.  °· Sometimes, the obstruction improves with bed rest and intravenous (IV) fluids. °· Resting the bowel is very important. This means following a simple diet. Sometimes, a clear liquid diet may be required for several days. °· Sometimes, a small tube (nasogastric tube) is placed into the stomach to decompress the bowel. When the bowel is blocked, it usually swells up like a balloon filled with air and fluids.  Decompression means that the air and fluids are removed by suction through that tube. This can help with pain, discomfort, and nausea. It can also help the obstruction resolve faster. °· Surgery may be required if other treatments do not work. Bowel obstruction from a hernia may require early surgery and can be an emergency procedure. Adhesions that cause frequent or severe obstructions may also require surgery. °HOME CARE INSTRUCTIONS °If your bowel obstruction is only partial or incomplete, you may be allowed to go home. °· Get plenty of rest. °· Follow your diet as directed by your caregiver. °· Only consume clear liquids until your condition improves. °· Avoid solid foods as instructed. °SEEK IMMEDIATE MEDICAL CARE IF: °· You have increased pain or cramping. °· You vomit blood. °· You have uncontrolled vomiting or nausea. °· You cannot drink fluids due to vomiting or pain. °· You develop confusion. °· You begin feeling very dry or thirsty (dehydrated). °· You have severe bloating. °· You have chills. °· You have a fever. °· You feel extremely weak or you faint. °MAKE SURE YOU: °· Understand these instructions. °· Will watch your condition. °· Will get help right away if you are not doing well or get worse. °Document Released: 07/03/2005 Document Revised: 07/09/2011 Document Reviewed: 06/30/2010 °Exit[employer]® Patient Information ©2015 [employer]. This information is not intended to replace advice given to you by your health care provider. Make sure you discuss any questions you have with your health care provider. ° °

## 2014-03-15 NOTE — Progress Notes (Signed)
AVS discharge instructions were reviewed with patient and his daughter Drema Balzarine. Patient and his daughter stated that they did not have any questions. Volunteers will assist patient to their transportation.

## 2014-03-16 ENCOUNTER — Telehealth: Payer: Self-pay | Admitting: *Deleted

## 2014-03-16 NOTE — Telephone Encounter (Signed)
Transition Care Management Follow-up Telephone Call D/c 03/15/14  How have you been since you were released from the hospital? Called pt daughter back she was at the pt house. spoke with som-in-law he stated that wife is ware of appt   Do you understand why you were in the hospital? YES, family understood   Do you understand the discharge instrcutions? Yes, family understood  Items Reviewed:  Medications reviewed:NO  Allergies reviewed: NO  Dietary changes reviewed:   Referrals reviewed: {   Functional Questionnaire:   Activities of Daily Living (ADLs):   Son-in-law stated  they are independent in the following: ambulation, bathing and hygiene and dressing States he is going to require assistance with the following: ambulation, bathing and hygiene and dressing   Any transportation issues/concerns?: NO   Any patient concerns? Yes family is concern that dad lives alone. He is going to need to get some home care service/aide. Jacqulynn Cadet states his wife is sick herself and she is not able to do all those things   Confirmed importance and date/time of follow-up visits scheduled: YES, family had already made appt for 03/22/14. Advise them to keep appt   Confirmed with patient if condition begins to worsen call PCP or go to the ER.  Patient was given the [facility] line [phone]: {YES

## 2014-03-16 NOTE — Telephone Encounter (Signed)
Called daughter (Mrs. Lenor Coffin) concerning TCM appt. She was not home left msg with her daughter to return call to me...[name]

## 2014-03-18 ENCOUNTER — Emergency Department (HOSPITAL_COMMUNITY)
Admission: EM | Admit: 2014-03-18 | Discharge: 2014-03-18 | Disposition: A | Discharge status: Home / Self Care | Payer: Commercial Managed Care - HMO | Attending: Emergency Medicine | Admitting: Emergency Medicine

## 2014-03-18 ENCOUNTER — Encounter (HOSPITAL_COMMUNITY): Payer: Self-pay | Admitting: Emergency Medicine

## 2014-03-18 DIAGNOSIS — Z87891 Personal history of nicotine dependence: Secondary | ICD-10-CM | POA: Diagnosis not present

## 2014-03-18 DIAGNOSIS — I10 Essential (primary) hypertension: Secondary | ICD-10-CM | POA: Insufficient documentation

## 2014-03-18 DIAGNOSIS — Z8546 Personal history of malignant neoplasm of prostate: Secondary | ICD-10-CM | POA: Insufficient documentation

## 2014-03-18 DIAGNOSIS — R319 Hematuria, unspecified: Secondary | ICD-10-CM | POA: Diagnosis present

## 2014-03-18 DIAGNOSIS — E119 Type 2 diabetes mellitus without complications: Secondary | ICD-10-CM | POA: Insufficient documentation

## 2014-03-18 DIAGNOSIS — Z7982 Long term (current) use of aspirin: Secondary | ICD-10-CM | POA: Diagnosis not present

## 2014-03-18 DIAGNOSIS — H902 Conductive hearing loss, unspecified: Secondary | ICD-10-CM | POA: Diagnosis not present

## 2014-03-18 DIAGNOSIS — F419 Anxiety disorder, unspecified: Secondary | ICD-10-CM | POA: Insufficient documentation

## 2014-03-18 DIAGNOSIS — Z85038 Personal history of other malignant neoplasm of large intestine: Secondary | ICD-10-CM | POA: Insufficient documentation

## 2014-03-18 DIAGNOSIS — N3001 Acute cystitis with hematuria: Secondary | ICD-10-CM | POA: Diagnosis not present

## 2014-03-18 DIAGNOSIS — F329 Major depressive disorder, single episode, unspecified: Secondary | ICD-10-CM | POA: Insufficient documentation

## 2014-03-18 DIAGNOSIS — Z79899 Other long term (current) drug therapy: Secondary | ICD-10-CM | POA: Insufficient documentation

## 2014-03-18 LAB — CBC
HCT: 35.4 % — ABNORMAL LOW (ref 39.0–52.0)
Hemoglobin: 11.9 g/dL — ABNORMAL LOW (ref 13.0–17.0)
MCH: 28.7 pg (ref 26.0–34.0)
MCHC: 33.6 g/dL (ref 30.0–36.0)
MCV: 85.5 fL (ref 78.0–100.0)
PLATELETS: 171 10*3/uL (ref 150–400)
RBC: 4.14 MIL/uL — ABNORMAL LOW (ref 4.22–5.81)
RDW: 14.8 % (ref 11.5–15.5)
WBC: 7 10*3/uL (ref 4.0–10.5)

## 2014-03-18 LAB — URINALYSIS, ROUTINE W REFLEX MICROSCOPIC
Glucose, UA: NEGATIVE mg/dL
KETONES UR: 15 mg/dL — AB
Nitrite: POSITIVE — AB
Protein, ur: >300 mg/dL — AB
SPECIFIC GRAVITY, URINE: 1.017 (ref 1.005–1.030)
UROBILINOGEN UA: 1 mg/dL (ref 0.0–1.0)
pH: 8 (ref 5.0–8.0)

## 2014-03-18 LAB — COMPREHENSIVE METABOLIC PANEL
ALT: 15 U/L (ref 0–53)
ANION GAP: 11 (ref 5–15)
AST: 22 U/L (ref 0–37)
Albumin: 2.8 g/dL — ABNORMAL LOW (ref 3.5–5.2)
Alkaline Phosphatase: 103 U/L (ref 39–117)
BILIRUBIN TOTAL: 0.4 mg/dL (ref 0.3–1.2)
BUN: 16 mg/dL (ref 6–23)
CHLORIDE: 106 meq/L (ref 96–112)
CO2: 25 mEq/L (ref 19–32)
Calcium: 8.5 mg/dL (ref 8.4–10.5)
Creatinine, Ser: 1.15 mg/dL (ref 0.50–1.35)
GFR calc Af Amer: 64 mL/min — ABNORMAL LOW (ref >90)
GFR calc non Af Amer: 55 mL/min — ABNORMAL LOW (ref >90)
Glucose, Bld: 71 mg/dL (ref 70–99)
Potassium: 4.2 mEq/L (ref 3.7–5.3)
Sodium: 142 mEq/L (ref 137–147)
Total Protein: 5.7 g/dL — ABNORMAL LOW (ref 6.0–8.3)

## 2014-03-18 LAB — URINE MICROSCOPIC-ADD ON

## 2014-03-18 MED ORDER — CEPHALEXIN 500 MG PO CAPS: 1000.0000 mg | ORAL_CAPSULE | Freq: Two times a day (BID) | ORAL | Status: DC

## 2014-03-18 NOTE — ED Provider Notes (Signed)
Level V caveat dementia. History is obtained from patient and from patient's daughter who accompanies him. Patient complained of blood in urine and burning with urination earlier tonight. No fever no vomiting. He is presently asymptomatic.on exam alert no distress lungs clear auscultation abdomen nondistended nontender. Plan urine culture. Antibiotics. I stressed the need for follow-up with urologist  Orlie Dakin, MD 03/18/14 [phone]

## 2014-03-18 NOTE — Discharge Instructions (Signed)
Return here as needed. Follow up with your Urologist °

## 2014-03-18 NOTE — ED Notes (Signed)
Family at bedside. 

## 2014-03-18 NOTE — ED Notes (Signed)
Patient has blood noted in brief during pericare.

## 2014-03-18 NOTE — ED Notes (Signed)
Family reported that pt. woke up this morning with hematuria , denies dysuria , no fever or chills.

## 2014-03-19 ENCOUNTER — Telehealth: Payer: Self-pay | Admitting: *Deleted

## 2014-03-19 ENCOUNTER — Telehealth: Payer: Self-pay | Admitting: Internal Medicine

## 2014-03-19 NOTE — Telephone Encounter (Signed)
yes

## 2014-03-19 NOTE — Telephone Encounter (Signed)
Please advise on no BM x3 days (see prev note, same day). Pt has appt next with PCP to f/u hospital.

## 2014-03-19 NOTE — Telephone Encounter (Signed)
Please advise 

## 2014-03-19 NOTE — Telephone Encounter (Signed)
Left detailed message on David (PT) vm.

## 2014-03-19 NOTE — Telephone Encounter (Signed)
Pt called back spoke with daughter, scheduled for 11/21 at clinic, explained location ([place]). Pt will go to urgent care tonight if they feel necessary.

## 2014-03-19 NOTE — Telephone Encounter (Signed)
[facility] Triage Call Report Triage Record Num: [id number] Operator: Prentice Docker [place] Patient Name: Russell [name] Call Date & Time: 03/18/2014 2:51:23AM Patient Phone: [phone] PCP: [facility] Ranger Patient Gender: Male PCP Fax : [phone] Patient DOB: 1924/04/16 Practice Name[facility] Reason for Call: Caller: Brenda/Significant Other; PCP: Scarlette Calico (Adults only); CB#: [phone]; Call regarding Urinary Pain/Bleeding; Onset bloody urine 03/18/14 02:30. Burning with urination. No vomiting. No thermometer. Small bowel obstruction and was in hospital until 03/16/15. Did not have surgery. Pt is c/o pain with urination and caller states there is blood also in urinal. Emergent S&S identified per Bloody urine:urinary tract symptoms and any flank,low back,lower abd or genital pain. Advised ED. [facility]. Protocol(s) Used: Bloody Urine Recommended Outcome per Protocol: See Provider within 4 hours Reason for Outcome: Urinary tract symptoms AND any flank, low back, lower abdominal or genital area (labia, vagina OR testicle/scrotum) pain Care Advice: ~ Another adult should drive. ~ Tell provider medical history of renal disease; especially if have only one kidney. ~ IMMEDIATE ACTION ~ List, or take, all current prescription(s), nonprescription or alternative medication(s) to provider for evaluation. Systemic Inflammatory Response Syndrome (SIRS): Watch for signs of a generalized, whole body infection. Occurs within days of a localized infection, especially of the urinary, GI, respiratory or nervous systems; or after a traumatic injury or invasive procedure. - Call EMS 911 if symptoms have worsened, such as increasing confusion or unusual drowsiness; cold and clammy skin; no urine output; rapid respiration (>30/min.) or slow respiration (<10/min.); struggling to breathe. - Go to the ED immediately for early symptoms of rapid pulse >90/min. or rapid breathing  >20/min. at rest; chills; oral temperature >100.4 F (38 C) or <96.8 F (36 C) when associated with conditions noted. ~ Total water intake includes drinking water, water in beverages, and water contained in food. Fluids make up about 80% of the body's total hydration need. Individual fluid requirement to maintain hydration vary based on physical activity, environmental factors and illness. Limit fluids that contain sugar, caffeine, or alcohol. Urine will be very light yellow color when you drink enough fluids. ~ ~ Go to the ED immediately if you develop unbearable flank, low back pain or lower abdominal pain. 03/18/2014 3:39:33AM Page 1 of 1 [id number]

## 2014-03-19 NOTE — Telephone Encounter (Signed)
He needs to be seen

## 2014-03-19 NOTE — Telephone Encounter (Signed)
Shanon Brow - Physical Therapist calling for verbal order for PT 2xs  a week for 4 wks for balance, strength. Also requesting verbal order for OT to address bathing and dressing problems.

## 2014-03-19 NOTE — Telephone Encounter (Signed)
Daughter called stating her father has had no bowel movement in 3 days, pt has hx of small bowel obstruction. Pls advise what they should do. [phone] Russell [name]

## 2014-03-20 ENCOUNTER — Ambulatory Visit: Payer: Self-pay | Admitting: Family Medicine

## 2014-03-20 LAB — URINE CULTURE

## 2014-03-21 ENCOUNTER — Telehealth: Payer: Self-pay | Admitting: Emergency Medicine

## 2014-03-21 NOTE — Telephone Encounter (Signed)
Post ED Visit - Positive Culture Follow-up  Culture report reviewed by antimicrobial stewardship pharmacist: []  Wes [place], Pharm.D., BCPS []  Heide Guile, Pharm.D., BCPS [x]  Alycia Rossetti, Pharm.D., BCPS []  [place], Pharm.D., BCPS, AAHIVP []  Legrand Como, Pharm.D., BCPS, AAHIVP []  Elicia Lamp, Pharm.D.   Positive urine culture Treated with Keflex, organism sensitive to the same and no further patient follow-up is required at this time.  Myrna Blazer 03/21/2014, 5:52 PM

## 2014-03-22 ENCOUNTER — Ambulatory Visit (INDEPENDENT_AMBULATORY_CARE_PROVIDER_SITE_OTHER): Payer: Commercial Managed Care - HMO | Admitting: Internal Medicine

## 2014-03-22 ENCOUNTER — Other Ambulatory Visit (INDEPENDENT_AMBULATORY_CARE_PROVIDER_SITE_OTHER): Payer: Commercial Managed Care - HMO

## 2014-03-22 ENCOUNTER — Other Ambulatory Visit: Payer: Self-pay | Admitting: Internal Medicine

## 2014-03-22 ENCOUNTER — Encounter: Payer: Self-pay | Admitting: Internal Medicine

## 2014-03-22 VITALS — BP 110/60 | HR 81 | Temp 98.1°F | Resp 16 | Ht 67.0 in | Wt 162.0 lb

## 2014-03-22 DIAGNOSIS — N3001 Acute cystitis with hematuria: Secondary | ICD-10-CM

## 2014-03-22 DIAGNOSIS — I10 Essential (primary) hypertension: Secondary | ICD-10-CM

## 2014-03-22 DIAGNOSIS — K565 Intestinal adhesions [bands], unspecified as to partial versus complete obstruction: Secondary | ICD-10-CM

## 2014-03-22 DIAGNOSIS — F339 Major depressive disorder, recurrent, unspecified: Secondary | ICD-10-CM

## 2014-03-22 LAB — URINALYSIS, ROUTINE W REFLEX MICROSCOPIC
BILIRUBIN URINE: NEGATIVE
KETONES UR: NEGATIVE
Nitrite: NEGATIVE
PH: 7 (ref 5.0–8.0)
Specific Gravity, Urine: 1.01 (ref 1.000–1.030)
TOTAL PROTEIN, URINE-UPE24: NEGATIVE
Urine Glucose: NEGATIVE
Urobilinogen, UA: 0.2 (ref 0.0–1.0)

## 2014-03-22 MED ORDER — ALPRAZOLAM 0.5 MG PO TABS: 0.5000 mg | ORAL_TABLET | Freq: Three times a day (TID) | ORAL | Status: DC | PRN

## 2014-03-22 MED ORDER — HYDROCHLOROTHIAZIDE 25 MG PO TABS: 25.0000 mg | ORAL_TABLET | Freq: Every day | ORAL | Status: DC

## 2014-03-22 MED ORDER — SERTRALINE HCL 100 MG PO TABS: 100.0000 mg | ORAL_TABLET | Freq: Every day | ORAL | Status: DC

## 2014-03-22 MED ORDER — LOSARTAN POTASSIUM 100 MG PO TABS: 100.0000 mg | ORAL_TABLET | Freq: Every day | ORAL | Status: DC

## 2014-03-22 MED ORDER — SIMVASTATIN 80 MG PO TABS: 40.0000 mg | ORAL_TABLET | Freq: Every evening | ORAL | Status: DC

## 2014-03-22 NOTE — Patient Instructions (Signed)

## 2014-03-22 NOTE — Assessment & Plan Note (Signed)
He has had a good appetite and normal BM's for the last 3 days Will follow for now

## 2014-03-22 NOTE — Assessment & Plan Note (Signed)
I will recheck his UA and urine to see if this has resolved with keflex

## 2014-03-22 NOTE — Progress Notes (Signed)
Subjective:    Patient ID: Russell [name], male    DOB: [birth year], 80 y.o.   MRN: [id number]  Dysuria  This is a recurrent problem. The current episode started in the past 7 days. The problem occurs intermittently. The problem has been gradually improving. The quality of the pain is described as aching. The pain is at a severity of 0/10. The patient is experiencing no pain. There has been no fever. The fever has been present for less than 1 day. He is not sexually active. There is no history of pyelonephritis. Pertinent negatives include no chills, discharge, flank pain, frequency, hematuria, hesitancy, nausea, sweats, urgency or vomiting. He has tried antibiotics for the symptoms. The treatment provided moderate relief. His past medical history is significant for catheterization and urinary stasis. There is no history of kidney stones, recurrent UTIs, a single kidney or a urological procedure.      Review of Systems  Constitutional: Negative.  Negative for fever, chills, diaphoresis, activity change, appetite change and fatigue.  HENT: Negative.   Eyes: Negative.   Respiratory: Negative.  Negative for cough, choking, chest tightness, shortness of breath and stridor.   Cardiovascular: Negative.  Negative for chest pain, palpitations and leg swelling.  Gastrointestinal: Negative.  Negative for nausea, vomiting, abdominal pain, diarrhea, constipation and blood in stool.  Endocrine: Negative.   Genitourinary: Positive for dysuria. Negative for hesitancy, urgency, frequency, hematuria and flank pain.  Musculoskeletal: Negative.  Negative for myalgias, back pain, joint swelling and arthralgias.  Skin: Negative.   Allergic/Immunologic: Negative.   Neurological: Negative.   Hematological: Negative.  Negative for adenopathy. Does not bruise/bleed easily.  Psychiatric/Behavioral: Positive for sleep disturbance, dysphoric mood and decreased concentration. Negative for suicidal ideas, hallucinations,  behavioral problems, confusion, self-injury and agitation. The patient is nervous/anxious. The patient is not hyperactive.        Objective:   Physical Exam  Constitutional: He is oriented to person, place, and time. He appears well-developed and well-nourished. No distress.  HENT:  Head: Normocephalic and atraumatic.  Mouth/Throat: Oropharynx is clear and moist. No oropharyngeal exudate.  Eyes: Conjunctivae are normal. Right eye exhibits no discharge. Left eye exhibits no discharge. No scleral icterus.  Neck: Normal range of motion. Neck supple. No JVD present. No tracheal deviation present. No thyromegaly present.  Cardiovascular: Normal rate, regular rhythm, normal heart sounds and intact distal pulses.  Exam reveals no gallop and no friction rub.   No murmur heard. Pulmonary/Chest: Effort normal and breath sounds normal. No stridor. No respiratory distress. He has no wheezes. He has no rales. He exhibits no tenderness.  Abdominal: Soft. Bowel sounds are normal. He exhibits no distension and no mass. There is no tenderness. There is no rebound and no guarding.  Musculoskeletal: Normal range of motion. He exhibits no edema or tenderness.  Lymphadenopathy:    He has no cervical adenopathy.  Neurological: He is oriented to person, place, and time.  Skin: Skin is warm and dry. No rash noted. He is not diaphoretic. No erythema. No pallor.  Vitals reviewed.     Lab Results  Component Value Date   WBC 7.0 03/18/2014   HGB 11.9* 03/18/2014   HCT 35.4* 03/18/2014   PLT 171 03/18/2014   GLUCOSE 71 03/18/2014   CHOL 127 01/07/2014   TRIG 58.0 01/07/2014   HDL 48.20 01/07/2014   LDLCALC 67 01/07/2014   ALT 15 03/18/2014   AST 22 03/18/2014   NA 142 03/18/2014   K 4.2 03/18/2014  CL 106 03/18/2014   CREATININE 1.15 03/18/2014   BUN 16 03/18/2014   CO2 25 03/18/2014   TSH 2.48 01/07/2014   PSA 1.94 02/20/2013   HGBA1C 5.7* 03/09/2014   MICROALBUR 1.6 09/07/2009        Assessment & Plan:

## 2014-03-22 NOTE — Progress Notes (Signed)
Pre visit review using our clinic review tool, if applicable. No additional management support is needed unless otherwise documented below in the visit note. 

## 2014-03-22 NOTE — Assessment & Plan Note (Signed)
BP is well controlled Lytes and renal function are stable 

## 2014-03-23 LAB — CULTURE, URINE COMPREHENSIVE
COLONY COUNT: NO GROWTH
Organism ID, Bacteria: NO GROWTH

## 2014-03-24 NOTE — ED Provider Notes (Signed)
CSN: [id number]     Arrival date & time 03/18/14  0357 History   First MD Initiated Contact with Patient 03/18/14 0600     Chief Complaint  Patient presents with  . Hematuria     (Consider location/radiation/quality/duration/timing/severity/associated sxs/prior Treatment) HPI Patient presents to the emergency department with hematuria that started this morning.  The patient does have some dementia but does answer some questions properly.  Patient is not have any complaints of pain.  The patient states that he has not had any vomiting, weakness, dizziness, blurred vision, headache, back pain, neck pain, nausea, fever, or syncope.  The patient states nothing seems to make his condition, better or worse.  Patient did not take any medications prior to arrival.  The family noted.  Several blood clots noted in his urine and is what prompted to come to the ER Past Medical History  Diagnosis Date  . Hyperlipidemia   . Hypertension   . Dementia   . Anxiety associated with depression   . Urolithiasis   . History of cystoscopy     uretheral stricutre, 2005- Dr.Humphreys  . FTT (failure to thrive)     admited 08/2008  . Osteoarthritis     hands  . Colon cancer     s/p resection 12/06  . Prostate cancer     s/p XRT  . Diabetes mellitus     diet mgt (11`/12)  . Loss of hearing     being fitted for hearing aids (11/12)  . SBO (small bowel obstruction) 03/08/2014   Past Surgical History  Procedure Laterality Date  . Hemicolectomy  12/06  . Transurethral resection of prostate    . Xrt      ERT for prostate ca  . Cataract extraction, bilateral     Family History  Problem Relation Age of Onset  . Diabetes Daughter   . Cancer Neg Hx   . Early death Neg Hx   . Heart disease Neg Hx   . Hyperlipidemia Neg Hx   . Hypertension Neg Hx   . Kidney disease Neg Hx   . Stroke Neg Hx    History  Substance Use Topics  . Smoking status: Former Smoker    Quit date: 03/20/1967  . Smokeless  tobacco: Never Used  . Alcohol Use: No     Comment: quit '68    Review of Systems  All other systems negative except as documented in the HPI. All pertinent positives and negatives as reviewed in the HPI.  Allergies  Review of patient's allergies indicates no known allergies.  Home Medications   Prior to Admission medications   Medication Sig Start Date End Date Taking? Authorizing Provider  aspirin 81 MG tablet Take 81 mg by mouth daily.     Yes Historical Provider, MD  B Complex Vitamins (VITAMIN-B COMPLEX PO) Take 1 tablet by mouth daily.    Yes Historical Provider, MD  fesoterodine (TOVIAZ) 8 MG TB24 tablet Take 8 mg by mouth daily.    Yes Historical Provider, MD  glucose blood (ACCU-CHEK AVIVA) test strip Test up to TID Dx: 250.02 06/30/13  Yes Janith Lima, MD  HYDROcodone-acetaminophen (NORCO/VICODIN) 5-325 MG per tablet Take 1 tablet by mouth every 6 (six) hours as needed. Patient taking differently: Take 1 tablet by mouth every 6 (six) hours as needed for moderate pain.  01/07/14  Yes Janith Lima, MD  Lancets (ACCU-CHEK SOFT TOUCH) lancets Test up to TID Dx: 250.02 11/10/12  Yes Thomas L  Ronnald Ramp, MD  omeprazole (PRILOSEC OTC) 20 MG tablet Take 20 mg by mouth daily.    Yes Historical Provider, MD  triamcinolone cream (KENALOG) 0.1 % Apply 1 application topically 2 (two) times daily.  05/20/13  Yes Historical Provider, MD  trospium (SANCTURA) 20 MG tablet Take 20 mg by mouth at bedtime.  02/04/13  Yes Historical Provider, MD  ALPRAZolam Duanne Moron) 0.5 MG tablet Take 1 tablet (0.5 mg total) by mouth 3 (three) times daily as needed for anxiety. 03/22/14   Janith Lima, MD  Blood Glucose Monitoring Suppl (ACCU-CHEK AVIVA PLUS) W/DEVICE KIT 1 kit by Does not apply route 3 (three) times daily. 11/04/12   Janith Lima, MD  cephALEXin (KEFLEX) 500 MG capsule Take 2 capsules (1,000 mg total) by mouth 2 (two) times daily. 03/18/14   Resa Miner Teancum Brule, PA-C  hydrochlorothiazide (HYDRODIURIL)  25 MG tablet Take 1 tablet (25 mg total) by mouth daily. 03/22/14   Janith Lima, MD  losartan (COZAAR) 100 MG tablet Take 1 tablet (100 mg total) by mouth daily. 03/22/14   Janith Lima, MD  sertraline (ZOLOFT) 100 MG tablet Take 1 tablet (100 mg total) by mouth daily. 03/22/14   Janith Lima, MD  simvastatin (ZOCOR) 80 MG tablet Take 0.5 tablets (40 mg total) by mouth every evening. 03/22/14   Janith Lima, MD   BP 126/52 mmHg  Pulse 66  Temp(Src) 98.4 F (36.9 C) (Oral)  Resp 16  SpO2 99% Physical Exam  Constitutional: He is oriented to person, place, and time. He appears well-developed and well-nourished. No distress.  HENT:  Head: Normocephalic and atraumatic.  Mouth/Throat: Oropharynx is clear and moist.  Eyes: Pupils are equal, round, and reactive to light.  Neck: Normal range of motion. Neck supple.  Cardiovascular: Normal rate, regular rhythm and normal heart sounds.  Exam reveals no gallop and no friction rub.   No murmur heard. Pulmonary/Chest: Effort normal and breath sounds normal. No respiratory distress.  Abdominal: Soft. Bowel sounds are normal. He exhibits no distension. There is no tenderness. There is no guarding.  Musculoskeletal: He exhibits no edema.  Neurological: He is alert and oriented to person, place, and time. He exhibits normal muscle tone. Coordination normal.  Skin: Skin is warm and dry. No rash noted. No erythema.  Nursing note and vitals reviewed.   ED Course  Procedures (including critical care time) Labs Review Labs Reviewed  URINALYSIS, ROUTINE W REFLEX MICROSCOPIC - Abnormal; Notable for the following:    Color, Urine RED (*)    APPearance TURBID (*)    Hgb urine dipstick LARGE (*)    Bilirubin Urine SMALL (*)    Ketones, ur 15 (*)    Protein, ur >300 (*)    Nitrite POSITIVE (*)    Leukocytes, UA LARGE (*)    All other components within normal limits  CBC - Abnormal; Notable for the following:    RBC 4.14 (*)    Hemoglobin 11.9  (*)    HCT 35.4 (*)    All other components within normal limits  COMPREHENSIVE METABOLIC PANEL - Abnormal; Notable for the following:    Total Protein 5.7 (*)    Albumin 2.8 (*)    GFR calc non Af Amer 55 (*)    GFR calc Af Amer 64 (*)    All other components within normal limits  URINE MICROSCOPIC-ADD ON - Abnormal; Notable for the following:    Bacteria, UA FEW (*)  All other components within normal limits  URINE CULTURE    Patient will be  treated for UTI.  Advised follow-up with his primary care Dr. told to return here as needed.  The family was comfortable with the plan and all questions were answered.  Patient has been stable and all his vital signs remained normal.   MDM   Final diagnoses:  Hematuria  Acute cystitis with hematuria       Brent General, PA-C 03/24/14 [place], MD 03/24/14 [phone]

## 2014-03-30 ENCOUNTER — Telehealth: Payer: Self-pay | Admitting: Internal Medicine

## 2014-03-30 NOTE — Telephone Encounter (Signed)
Left 2nd vm for pt to return call

## 2014-03-30 NOTE — Telephone Encounter (Addendum)
Returned patient's daughter, Varney Baas call, no answer on home or cell. LMOVM to call back.

## 2014-03-30 NOTE — Telephone Encounter (Signed)
Pt daughter called in requesting call from nurse.  She said that she has some question to ask and would not tell me.  She said that he is sick and it was kinda urgent that she speak with someone  Best number is [phone]

## 2014-04-07 NOTE — Telephone Encounter (Signed)
Please return daughter's call, [place]

## 2014-04-09 ENCOUNTER — Other Ambulatory Visit (INDEPENDENT_AMBULATORY_CARE_PROVIDER_SITE_OTHER): Payer: Commercial Managed Care - HMO

## 2014-04-09 ENCOUNTER — Telehealth: Payer: Self-pay | Admitting: Family

## 2014-04-09 ENCOUNTER — Encounter: Payer: Self-pay | Admitting: Family

## 2014-04-09 ENCOUNTER — Ambulatory Visit (INDEPENDENT_AMBULATORY_CARE_PROVIDER_SITE_OTHER): Payer: Commercial Managed Care - HMO | Admitting: Family

## 2014-04-09 DIAGNOSIS — R410 Disorientation, unspecified: Secondary | ICD-10-CM

## 2014-04-09 LAB — POCT URINALYSIS DIPSTICK
Bilirubin, UA: NEGATIVE
Blood, UA: NEGATIVE
Glucose, UA: NEGATIVE
KETONES UA: NEGATIVE
Leukocytes, UA: NEGATIVE
Nitrite, UA: NEGATIVE
PH UA: 6
PROTEIN UA: NEGATIVE
Spec Grav, UA: 1.015
UROBILINOGEN UA: 4

## 2014-04-09 LAB — CBC
HCT: 41.8 % (ref 39.0–52.0)
Hemoglobin: 13.7 g/dL (ref 13.0–17.0)
MCHC: 32.8 g/dL (ref 30.0–36.0)
MCV: 88.8 fl (ref 78.0–100.0)
Platelets: 197 10*3/uL (ref 150.0–400.0)
RBC: 4.7 Mil/uL (ref 4.22–5.81)
RDW: 15.7 % — ABNORMAL HIGH (ref 11.5–15.5)
WBC: 4.1 10*3/uL (ref 4.0–10.5)

## 2014-04-09 NOTE — Telephone Encounter (Signed)
Please call patient her family and inform them that the white blood cell counts are normal therefore it is unlikely that his infection. Therefore we'll continue with the plan to refer to neurology for dementia testing.

## 2014-04-09 NOTE — Assessment & Plan Note (Signed)
Patient with previous history of delirium. Question whether symptoms are related to depression or underlying dementia. In the office POCT UA was negative for urinary tract infection. He continues to have normal bowel movements. Obtain CBC to rule out infection. Refer to neurology for further dementia testing.

## 2014-04-09 NOTE — Telephone Encounter (Signed)
LMOVM for callback.

## 2014-04-09 NOTE — Progress Notes (Signed)
Subjective:    Patient ID: Russell [name], male    DOB: [birth year], 80 y.o.   MRN: [id number]  Chief Complaint  Patient presents with  . Urinary Tract Infection    HPI:  Russell [name] is a 80 y.o. male who presents today for an acute visit.    Acute symptoms started about 2 weeks ago after being in the hospital for small bowel obstruction. Following hospitalization he was rechecked for cystitis and treated with keflex. The urine culture was negative for any growth. Family is concerned because he has been more agitated and restless.   No Known Allergies  Current Outpatient Prescriptions on File Prior to Visit  Medication Sig Dispense Refill  . ALPRAZolam (XANAX) 0.5 MG tablet Take 1 tablet (0.5 mg total) by mouth 3 (three) times daily as needed for anxiety. 60 tablet 3  . aspirin 81 MG tablet Take 81 mg by mouth daily.      . B Complex Vitamins (VITAMIN-B COMPLEX PO) Take 1 tablet by mouth daily.     . Blood Glucose Monitoring Suppl (ACCU-CHEK AVIVA PLUS) W/DEVICE KIT 1 kit by Does not apply route 3 (three) times daily. 1 kit 1  . fesoterodine (TOVIAZ) 8 MG TB24 tablet Take 8 mg by mouth daily.     Marland Kitchen glucose blood (ACCU-CHEK AVIVA) test strip Test up to TID Dx: 250.02 300 each 3  . hydrochlorothiazide (HYDRODIURIL) 25 MG tablet Take 1 tablet (25 mg total) by mouth daily. 90 tablet 3  . HYDROcodone-acetaminophen (NORCO/VICODIN) 5-325 MG per tablet Take 1 tablet by mouth every 6 (six) hours as needed. (Patient taking differently: Take 1 tablet by mouth every 6 (six) hours as needed for moderate pain. ) 100 tablet 0  . Lancets (ACCU-CHEK SOFT TOUCH) lancets Test up to TID Dx: 250.02 100 each 12  . losartan (COZAAR) 100 MG tablet Take 1 tablet (100 mg total) by mouth daily. 90 tablet 3  . omeprazole (PRILOSEC OTC) 20 MG tablet Take 20 mg by mouth daily.     . sertraline (ZOLOFT) 100 MG tablet Take 1 tablet (100 mg total) by mouth daily. 90 tablet 3  . simvastatin (ZOCOR) 80 MG tablet Take  0.5 tablets (40 mg total) by mouth every evening. 90 tablet 3  . triamcinolone cream (KENALOG) 0.1 % Apply 1 application topically 2 (two) times daily.     . trospium (SANCTURA) 20 MG tablet Take 20 mg by mouth at bedtime.      No current facility-administered medications on file prior to visit.    Review of Systems    See HPI  Objective:    BP 140/72 mmHg  Pulse 65  Temp(Src) 97.7 F (36.5 C) (Oral)  Ht _0  (1.702 m)  Wt 155 lb (70.308 kg)  BMI 24.27 kg/m2  SpO2 97% Nursing note and vital signs reviewed.  Physical Exam  Constitutional: He is oriented to person, place, and time. He appears well-developed and well-nourished. No distress.  Elderly gentleman seated on the table dressed appropriately and appears his stated age.  Cardiovascular: Normal rate, regular rhythm, normal heart sounds and intact distal pulses.   Pulmonary/Chest: Effort normal and breath sounds normal.  Neurological: He is alert and oriented to person, place, and time.  Skin: Skin is warm and dry.  Psychiatric: He has a normal mood and affect. His behavior is normal.  Patient is alert and oriented to person, time, place, and self. He is unable to complete the Mini-Mental status exam at this  time. He tells the same stories repeatedly. He appeared irritable at times.       Assessment & Plan:

## 2014-04-09 NOTE — Patient Instructions (Signed)
Thank you for choosing [facility].  Summary/Instructions:  Please stop by the lab on the basement level of the building for your blood work. Your results will be released to [place] (or called to you) after review, usually within 72hours after test completion. If any changes need to be made, you will be notified at that same time.  Referrals have been made during this visit. You should expect to hear back from our schedulers in about 7-10 days in regards to establishing an appointment with the specialists we discussed.   If your symptoms worsen or fail to improve, please contact our office for further instruction, or in case of emergency go directly to the emergency room at the closest medical facility.   Delirium Delirium (acute confusional state) is a sudden change in a person's brain function that causes the person to become confused for a short period of time. People with delirium often have trouble knowing where they are. Delirium comes on very fast. It can develop in a few days or just a few hours. Delirium usually occurs because of another mental or physical condition. For example, a person might develop delirium after a surgery. It is especially common in elderly people who are sick or in the hospital. People with dementia (a brain disease like Alzheimer's) or people who are near death may also develop delirium.  CAUSES  Delirium occurs when something affects the signals that the brain sends out. These signals can be affected by anything that puts stress on the body and brain, causing brain chemicals to be out of balance. Structural health problems such as acute strokes, bleeding near the brain (intracranial bleeds), and trauma can also cause delirium. Sometimes the exact cause of delirium is not known. Usually, several factors contribute to the development of delirium. Things that may cause a person to develop delirium include: 1. Surgery, especially when anesthetics are  involved. 2. Chronic medical conditions, such as chronic lung, heart, or kidney disease. 3. Fever. 4. Low body temperature (hypothermia). 5. Infection, such as pneumonia, severe intestinal infection, severe skin infection, or urinary tract infection. 6. Poor nutrition that leads to very low vitamin or protein levels in the body (malnutrition). 7. Body fluid loss (dehydration). 8. Low blood sugar. 9. High blood sugar, which typically occurs in people with severe diabetes. 10. Electrolyte abnormalities, such as sodium imbalance or acid-base disorders. 11. Low oxygen level. 12. Low blood pressure. 13. Uncontrolled high blood pressure. 14. Brain injury (trauma). 15. Stroke. 16. Abuse of alcohol or sudden withdrawal of alcohol. 17. Sudden tobacco withdrawal if the person is a longtime smoker. 18. Loss of vision or hearing. 42. Being strapped down (restrained). 30. Being in a new setting, such as an elderly person being admitted to a hospital. 21. Taking illegal drugs or quitting use of those drugs. 51. Taking certain medicines for pain, sleep, allergies, high blood pressure, anxiety, depression, Parkinson disease, or seizures. 23. Sundowning syndrome, which is a complication of chronic dementia that can occur in the later part of a person's wake cycle. SYMPTOMS  The main sign of delirium is a sudden change in a person's mental state. This change can come and go. Symptoms may include:  Not being able to pay attention.  Being confused about places, time, and people.  Seeing, hearing, or feeling things that are not real (hallucinations).  Changes in sleep patterns.  Being restless, hyperactive, irritable, and angry.  Extreme mood swings.  Rambling and senseless talking.  Difficulty speaking or understanding speech.  Memory loss.  Changes in consciousness, such as being sleepy, sluggish, lethargic, and withdrawn.  Focusing on things or ideas that are not important.  Unusual  body movements or shaking (tremors). DIAGNOSIS  There are no specific tests for delirium, but a health care provider may do the following:  Perform a mental status assessment.The health care provider will check for confusion and lack of awareness by talking with the person and asking questions.  Talk with the person's friends and family. A friend or family member will often need to tell the health care provider about the person's symptoms and medical history, including medicines taken or missed.  Perform a physical exam. The health care provider will perform a physical exam to check for underlying conditions that may lead to delirium, such as dehydration, malnutrition, trauma, and infection. The exam may include checking for changes in vision, hearing, and the way the person moves (coordination and reflexes). The health care provider may order tests, such as:  Blood tests.  Urine tests.  Brain imaging, such as a CT scan or MRI scan.  X-rays (to look for lung problems or intestinal blockage). TREATMENT  Treatment will focus on the cause of the delirium. Delirium is a sign of another problem. If that problem can be found and treated, the delirium may go away. Full recovery can take several weeks. Keeping the person safe until the cause can be found and treated (supportive care) is often what is needed. In some cases, medicine may be prescribed to help keep the person calm. People with delirium should not be left alone because they may involuntarily harm themselves. HOME CARE INSTRUCTIONS  Supportive care is provided by the person living with, or caring for, the person with delirium. It involves keeping the person safe, encouraging healthy habits, and helping the person stay aware of his or her surroundings. Take these steps to help care for someone with delirium:  Make sure the person eats a healthy diet.  Make sure the person gets enough fluids. The person should drink enough fluids to keep  urine clear or pale yellow.  Do not leave the person alone.  Keep the person on a regular schedule. Maintain regular times for meals, sleeping, and being active.  Keep the home as quiet and stress-free as possible. This is very important at night and will help improve the quality of the person's sleep.  Let lots of sunlight into the home during the day.  Avoid total darkness at night.  Help the person maintain good hygiene to avoid skin infections, bed sores, and pressure ulcers.  Do activities outside as often as possible.  Take the person to see others whenever you can.  Make sure the person uses hearing aids and eyeglasses if needed.  Give frequent verbal reminders of the current time, location, and situation.  Use memory cues, such as clocks, calendars, and family photos.  Try to keep the person calm. Music or relaxation techniques may be helpful.  Always be on the lookout for symptoms of delirium.  Do not use restraints.  Only give over-the-counter or prescription medicines as directed by the health care provider.  Make sure the person takes all medicines on a regular schedule as directed by the health care provider.  Keep all follow-up appointments. SEEK MEDICAL CARE IF:  Any of the person's symptoms become worse.  New signs of delirium develop.  Caring for the person at home does not seem safe.  The person stops eating, drinking, or communicating.  The person develops vomiting. SEEK IMMEDIATE MEDICAL CARE IF:   Symptoms of delirium do not go away after treatment.  The person develops chest pain or shortness of breath.  The person seems to want to harm someone or harm himself or herself. MAKE SURE YOU:   Understand these instructions.  Will watch the condition.  Will get help right away if the person is not doing well or gets worse. Document Released: 01/09/2012 Document Revised: 08/31/2013 Document Reviewed: 01/09/2012 [facility] Patient Information  2015 [place], [place]. This information is not intended to replace advice given to you by your health care provider. Make sure you discuss any questions you have with your health care provider.  Dementia Dementia is a general term for problems with brain function. A person with dementia has memory loss and a hard time with at least one other brain function such as thinking, speaking, or problem solving. Dementia can affect social functioning, how you do your job, your mood, or your personality. The changes may be hidden for a long time. The earliest forms of this disease are usually not detected by family or friends. Dementia can be: 24. Irreversible. 25. Potentially reversible. 26. Partially reversible. 27. Progressive. This means it can get worse over time. CAUSES  Irreversible dementia causes may include:  Degeneration of brain cells (Alzheimer disease or Lewy body dementia).  Multiple small strokes (vascular dementia).  Infection (chronic meningitis or Creutzfeldt-Jakob disease).  Frontotemporal dementia. This affects younger people, age 18 to 93, compared to those who have Alzheimer disease.  Dementia associated with other disorders like Parkinson disease, Huntington disease, or HIV-associated dementia. Potentially or partially reversible dementia causes may include:  Medicines.  Metabolic causes such as excessive alcohol intake, vitamin B12 deficiency, or thyroid disease.  Masses or pressure in the brain such as a tumor, blood clot, or hydrocephalus. SIGNS AND SYMPTOMS  Symptoms are often hard to detect. Family members or coworkers may not notice them early in the disease process. Different people with dementia may have different symptoms. Symptoms can include:  A hard time with memory, especially recent memory. Long-term memory may not be impaired.  Asking the same question multiple times or forgetting something someone just said.  A hard time speaking your thoughts or finding  certain words.  A hard time solving problems or performing familiar tasks (such as how to use a telephone).  Sudden changes in mood.  Changes in personality, especially increasing moodiness or mistrust.  Depression.  A hard time understanding complex ideas that were never a problem in the past. DIAGNOSIS  There are no specific tests for dementia.   Your health care provider may recommend a thorough evaluation. This is because some forms of dementia can be reversible. The evaluation will likely include a physical exam and getting a detailed history from you and a family member. The history often gives the best clues and suggestions for a diagnosis.  Memory testing may be done. A detailed brain function evaluation called neuropsychologic testing may be helpful.  Lab tests and brain imaging (such as a CT scan or MRI scan) are sometimes important.  Sometimes observation and re-evaluation over time is very helpful. TREATMENT  Treatment depends on the cause.   If the problem is a vitamin deficiency, it may be helped or cured with supplements.  For dementias such as Alzheimer disease, medicines are available to stabilize or slow the course of the disease. There are no cures for this type of dementia.  Your health care provider  can help direct you to groups, organizations, and other health care providers to help with decisions in the care of you or your loved one. HOME CARE INSTRUCTIONS The care of individuals with dementia is varied and dependent upon the progression of the dementia. The following suggestions are intended for the person living with, or caring for, the person with dementia.  Create a safe environment.  Remove the locks on bathroom doors to prevent the person from accidentally locking himself or herself in.  Use childproof latches on kitchen cabinets and any place where cleaning supplies, chemicals, or alcohol are kept.  Use childproof covers in unused electrical  outlets.  Install childproof devices to keep doors and windows secured.  Remove stove knobs or install safety knobs and an automatic shut-off on the stove.  Lower the temperature on water heaters.  Label medicines and keep them locked up.  Secure knives, lighters, matches, power tools, and guns, and keep these items out of reach.  Keep the house free from clutter. Remove rugs or anything that might contribute to a fall.  Remove objects that might break and hurt the person.  Make sure lighting is good, both inside and outside.  Install grab rails as needed.  Use a monitoring device to alert you to falls or other needs for help.  Reduce confusion.  Keep familiar objects and people around.  Use night lights or dim lights at night.  Label items or areas.  Use reminders, notes, or directions for daily activities or tasks.  Keep a simple, consistent routine for waking, meals, bathing, dressing, and bedtime.  Create a calm, quiet environment.  Place large clocks and calendars prominently.  Display emergency numbers and home address near all telephones.  Use cues to establish different times of the day. An example is to open curtains to let the natural light in during the day.   Use effective communication.  Choose simple words and short sentences.  Use a gentle, calm tone of voice.  Be careful not to interrupt.  If the person is struggling to find a word or communicate a thought, try to provide the word or thought.  Ask one question at a time. Allow the person ample time to answer questions. Repeat the question again if the person does not respond.  Reduce nighttime restlessness.  Provide a comfortable bed.  Have a consistent nighttime routine.  Ensure a regular walking or physical activity schedule. Involve the person in daily activities as much as possible.  Limit napping during the day.  Limit caffeine.  Attend social events that stimulate rather than  overwhelm the senses.  Encourage good nutrition and hydration.  Reduce distractions during meal times and snacks.  Avoid foods that are too hot or too cold.  Monitor chewing and swallowing ability.  Continue with routine vision, hearing, dental, and medical screenings.  Give medicines only as directed by the health care provider.  Monitor driving abilities. Do not allow the person to drive when safe driving is no longer possible.  Register with an identification program which could provide location assistance in the event of a missing person situation. SEEK MEDICAL CARE IF:   New behavioral problems start such as moodiness, aggressiveness, or seeing things that are not there (hallucinations).  Any new problem with brain function happens. This includes problems with balance, speech, or falling a lot.  Problems with swallowing develop.  Any symptoms of other illness happen. Small changes or worsening in any aspect of brain function can be  a sign that the illness is getting worse. It can also be a sign of another medical illness such as infection. Seeing a health care provider right away is important. SEEK IMMEDIATE MEDICAL CARE IF:   A fever develops.  New or worsened confusion develops.  New or worsened sleepiness develops.  Staying awake becomes hard to do. Document Released: 10/10/2000 Document Revised: 08/31/2013 Document Reviewed: 09/11/2010 [facility] Patient Information 2015 [place], [place]. This information is not intended to replace advice given to you by your health care provider. Make sure you discuss any questions you have with your health care provider.  Depression Depression refers to feeling sad, low, down in the dumps, blue, gloomy, or empty. In general, there are two kinds of depression: 28. Normal sadness or normal grief. This kind of depression is one that we all feel from time to time after upsetting life experiences, such as the loss of a job or the ending of  a relationship. This kind of depression is considered normal, is short lived, and resolves within a few days to 2 weeks. Depression experienced after the loss of a loved one (bereavement) often lasts longer than 2 weeks but normally gets better with time. 29. Clinical depression. This kind of depression lasts longer than normal sadness or normal grief or interferes with your ability to function at home, at work, and in school. It also interferes with your personal relationships. It affects almost every aspect of your life. Clinical depression is an illness. Symptoms of depression can also be caused by conditions other than those mentioned above, such as:  Physical illness. Some physical illnesses, including underactive thyroid gland (hypothyroidism), severe anemia, specific types of cancer, diabetes, uncontrolled seizures, heart and lung problems, strokes, and chronic pain are commonly associated with symptoms of depression.  Side effects of some prescription medicine. In some people, certain types of medicine can cause symptoms of depression.  Substance abuse. Abuse of alcohol and illicit drugs can cause symptoms of depression. SYMPTOMS Symptoms of normal sadness and normal grief include the following:  Feeling sad or crying for short periods of time.  Not caring about anything (apathy).  Difficulty sleeping or sleeping too much.  No longer able to enjoy the things you used to enjoy.  Desire to be by oneself all the time (social isolation).  Lack of energy or motivation.  Difficulty concentrating or remembering.  Change in appetite or weight.  Restlessness or agitation. Symptoms of clinical depression include the same symptoms of normal sadness or normal grief and also the following symptoms:  Feeling sad or crying all the time.  Feelings of guilt or worthlessness.  Feelings of hopelessness or helplessness.  Thoughts of suicide or the desire to harm yourself (suicidal  ideation).  Loss of touch with reality (psychotic symptoms). Seeing or hearing things that are not real (hallucinations) or having false beliefs about your life or the people around you (delusions and paranoia). DIAGNOSIS  The diagnosis of clinical depression is usually based on how bad the symptoms are and how long they have lasted. Your health care provider will also ask you questions about your medical history and substance use to find out if physical illness, use of prescription medicine, or substance abuse is causing your depression. Your health care provider may also order blood tests. TREATMENT  Often, normal sadness and normal grief do not require treatment. However, sometimes antidepressant medicine is given for bereavement to ease the depressive symptoms until they resolve. The treatment for clinical depression depends on  how bad the symptoms are but often includes antidepressant medicine, counseling with a mental health professional, or both. Your health care provider will help to determine what treatment is best for you. Depression caused by physical illness usually goes away with appropriate medical treatment of the illness. If prescription medicine is causing depression, talk with your health care provider about stopping the medicine, decreasing the dose, or changing to another medicine. Depression caused by the abuse of alcohol or illicit drugs goes away when you stop using these substances. Some adults need professional help in order to stop drinking or using drugs. SEEK IMMEDIATE MEDICAL CARE IF:  You have thoughts about hurting yourself or others.  You lose touch with reality (have psychotic symptoms).  You are taking medicine for depression and have a serious side effect. FOR MORE INFORMATION  [employer] on Mental Illness: www.nami.[employer]: https://carter.com/ Document Released: 04/13/2000 Document Revised: 08/31/2013 Document Reviewed:  07/16/2011 [facility] Patient Information 2015 [place], [place]. This information is not intended to replace advice given to you by your health care provider. Make sure you discuss any questions you have with your health care provider.

## 2014-04-12 NOTE — Telephone Encounter (Signed)
Called pt and left message for him to call back

## 2014-04-12 NOTE — Telephone Encounter (Signed)
Pt aware that lab results are normal. 

## 2014-04-12 NOTE — Telephone Encounter (Signed)
Please call pt back.

## 2014-04-13 DIAGNOSIS — I10 Essential (primary) hypertension: Secondary | ICD-10-CM

## 2014-04-13 DIAGNOSIS — K565 Intestinal adhesions [bands] with obstruction (postprocedural) (postinfection): Secondary | ICD-10-CM

## 2014-04-13 DIAGNOSIS — N3001 Acute cystitis with hematuria: Secondary | ICD-10-CM

## 2014-04-13 DIAGNOSIS — F339 Major depressive disorder, recurrent, unspecified: Secondary | ICD-10-CM

## 2014-04-20 NOTE — Telephone Encounter (Signed)
Patient had appointment 04/09/14 with [place].

## 2014-05-07 ENCOUNTER — Ambulatory Visit (INDEPENDENT_AMBULATORY_CARE_PROVIDER_SITE_OTHER): Payer: PPO | Admitting: Neurology

## 2014-05-07 ENCOUNTER — Encounter: Payer: Self-pay | Admitting: Neurology

## 2014-05-07 VITALS — BP 130/70 | HR 68 | Ht 67.0 in | Wt 162.0 lb

## 2014-05-07 DIAGNOSIS — R413 Other amnesia: Secondary | ICD-10-CM
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
## 2014-05-07 NOTE — Patient Instructions (Signed)
1. Follow-up in 6 months 2. Continue with physical exercise and brain stimulation exercises for brain health
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
## 2014-05-13 ENCOUNTER — Encounter: Payer: Self-pay | Admitting: Internal Medicine

## 2014-05-13 ENCOUNTER — Ambulatory Visit (INDEPENDENT_AMBULATORY_CARE_PROVIDER_SITE_OTHER): Payer: PPO | Admitting: Internal Medicine

## 2014-05-13 ENCOUNTER — Ambulatory Visit (INDEPENDENT_AMBULATORY_CARE_PROVIDER_SITE_OTHER): Payer: PPO | Admitting: Podiatrist

## 2014-05-13 ENCOUNTER — Encounter: Payer: Self-pay | Admitting: Neurology

## 2014-05-13 VITALS — BP 136/70 | HR 68 | Temp 98.0°F | Resp 16 | Wt 158.0 lb

## 2014-05-13 DIAGNOSIS — M79673 Pain in unspecified foot: Secondary | ICD-10-CM

## 2014-05-13 DIAGNOSIS — M1990 Unspecified osteoarthritis, unspecified site: Secondary | ICD-10-CM

## 2014-05-13 DIAGNOSIS — M150 Primary generalized (osteo)arthritis: Secondary | ICD-10-CM

## 2014-05-13 DIAGNOSIS — E1140 Type 2 diabetes mellitus with diabetic neuropathy, unspecified: Secondary | ICD-10-CM

## 2014-05-13 DIAGNOSIS — M159 Polyosteoarthritis, unspecified: Secondary | ICD-10-CM

## 2014-05-13 DIAGNOSIS — F339 Major depressive disorder, recurrent, unspecified: Secondary | ICD-10-CM

## 2014-05-13 DIAGNOSIS — B351 Tinea unguium: Secondary | ICD-10-CM

## 2014-05-13 DIAGNOSIS — I10 Essential (primary) hypertension: Secondary | ICD-10-CM

## 2014-05-13 MED ORDER — HYDROCHLOROTHIAZIDE 25 MG PO TABS: 25.0000 mg | ORAL_TABLET | Freq: Every day | ORAL | Status: DC

## 2014-05-13 MED ORDER — HYDROCODONE-ACETAMINOPHEN 5-325 MG PO TABS: 1.0000 | ORAL_TABLET | Freq: Four times a day (QID) | ORAL | Status: DC | PRN

## 2014-05-13 MED ORDER — SIMVASTATIN 80 MG PO TABS: 40.0000 mg | ORAL_TABLET | Freq: Every evening | ORAL | Status: DC

## 2014-05-13 MED ORDER — ALPRAZOLAM 0.5 MG PO TABS: 0.5000 mg | ORAL_TABLET | Freq: Three times a day (TID) | ORAL | Status: DC | PRN

## 2014-05-13 MED ORDER — LOSARTAN POTASSIUM 100 MG PO TABS: 100.0000 mg | ORAL_TABLET | Freq: Every day | ORAL | Status: DC

## 2014-05-13 MED ORDER — SERTRALINE HCL 100 MG PO TABS: 100.0000 mg | ORAL_TABLET | Freq: Every day | ORAL | Status: DC

## 2014-05-13 MED ORDER — FESOTERODINE FUMARATE ER 8 MG PO TB24: 8.0000 mg | ORAL_TABLET | Freq: Every day | ORAL | Status: DC

## 2014-05-13 NOTE — Assessment & Plan Note (Signed)
His BP is well controlled Will cont the current meds

## 2014-05-13 NOTE — Progress Notes (Signed)
   Subjective:    Patient ID: Russell [name], male    DOB: [birth year], 81 y.o.   MRN: [id number]  Hypertension This is a chronic problem. Associated symptoms include anxiety. Pertinent negatives include no blurred vision, chest pain, headaches, malaise/fatigue, neck pain, orthopnea, palpitations, peripheral edema, PND, shortness of breath or sweats. Past treatments include angiotensin blockers and diuretics. The current treatment provides moderate improvement. There are no compliance problems.       Review of Systems  Constitutional: Positive for fatigue. Negative for fever, chills, malaise/fatigue, diaphoresis and appetite change.  HENT: Negative.   Eyes: Negative.  Negative for blurred vision.  Respiratory: Negative.  Negative for cough, choking, chest tightness, shortness of breath and stridor.   Cardiovascular: Negative.  Negative for chest pain, palpitations, orthopnea, leg swelling and PND.  Gastrointestinal: Negative.  Negative for nausea, abdominal pain, diarrhea, constipation and blood in stool.  Endocrine: Negative.   Genitourinary: Negative.  Negative for dysuria, urgency, frequency, hematuria, flank pain and difficulty urinating.  Musculoskeletal: Positive for arthralgias. Negative for myalgias, back pain, joint swelling, gait problem, neck pain and neck stiffness.  Skin: Negative.  Negative for rash.  Allergic/Immunologic: Negative.   Neurological: Negative.  Negative for headaches.  Hematological: Negative.  Negative for adenopathy. Does not bruise/bleed easily.  Psychiatric/Behavioral: Negative.        Objective:   Physical Exam  Constitutional: He is oriented to person, place, and time. He appears well-developed and well-nourished. No distress.  HENT:  Head: Normocephalic and atraumatic.  Mouth/Throat: Oropharynx is clear and moist. No oropharyngeal exudate.  Eyes: Conjunctivae are normal. Right eye exhibits no discharge. Left eye exhibits no discharge. No scleral  icterus.  Neck: Normal range of motion. Neck supple. No JVD present. No tracheal deviation present. No thyromegaly present.  Cardiovascular: Normal rate, regular rhythm, normal heart sounds and intact distal pulses.  Exam reveals no gallop and no friction rub.   No murmur heard. Pulmonary/Chest: Effort normal and breath sounds normal. No stridor. No respiratory distress. He has no wheezes. He has no rales. He exhibits no tenderness.  Abdominal: Soft. Bowel sounds are normal. He exhibits no distension and no mass. There is no tenderness. There is no rebound and no guarding.  Musculoskeletal: Normal range of motion. He exhibits edema (trace edema in BLE). He exhibits no tenderness.  Lymphadenopathy:    He has no cervical adenopathy.  Neurological: He is oriented to person, place, and time.  Skin: Skin is warm. No rash noted. He is not diaphoretic. No erythema. No pallor.  Vitals reviewed.    Lab Results  Component Value Date   WBC 4.1 04/09/2014   HGB 13.7 04/09/2014   HCT 41.8 04/09/2014   PLT 197.0 04/09/2014   GLUCOSE 71 03/18/2014   CHOL 127 01/07/2014   TRIG 58.0 01/07/2014   HDL 48.20 01/07/2014   LDLCALC 67 01/07/2014   ALT 15 03/18/2014   AST 22 03/18/2014   NA 142 03/18/2014   K 4.2 03/18/2014   CL 106 03/18/2014   CREATININE 1.15 03/18/2014   BUN 16 03/18/2014   CO2 25 03/18/2014   TSH 2.48 01/07/2014   PSA 1.94 02/20/2013   HGBA1C 5.7* 03/09/2014   MICROALBUR 1.6 09/07/2009       Assessment & Plan:

## 2014-05-13 NOTE — Progress Notes (Signed)
Subjective: Patient presents today for followup of foot and toenail care. He denies any change in medications or allergies.    Objective: Neurovascular status reveals palpable pedal pulses at 2+ out of 4 DP and PT bilateral. He does have increasing capillary refill time and decreased hair growth noted to the distal digits bilateral. He also has sensation decreased via Semmes Weinstein monofilament at 3/5 sites bilateral. Light touch is intact vibratory sensation is decreased bilateral. His skin is thick due to swelling. Moderate swelling is also present today. He also has a area of soft tissue lesion or cyst on the dorsal aspect of the left foot. It is not bothersome today. Contracture hammertoe present left second toe and pes planus foot type is noted. The patient also has elongated, thickened, dystrophic, mycotic toenails x10. They're painful and symptomatic.   Assessment: Symptomatic onychomycosis   Plan:Discussed etiology, pathology, conservative vs. Surgical therapies and at this time debridement of symptomatic toenails was recommended. Onychoreduction of symptomatic toenails was performed without iatrogenic incident. Patient was instructed on signs and symptoms of infection and was told to call immediately should any of these arise. Agreed with request for diabetic shoes from the patient.  I do feel this will assist with ambulation and be preventative versus ulceration in the future.  Will contact Dr. Ronnald Ramp ([place]) for approval and will proceed from there.

## 2014-05-13 NOTE — Progress Notes (Signed)
Pre visit review using our clinic review tool, if applicable. No additional management support is needed unless otherwise documented below in the visit note. 

## 2014-05-13 NOTE — Patient Instructions (Signed)

## 2014-05-13 NOTE — Assessment & Plan Note (Signed)
He is getting pain relief with norco, will continue

## 2014-06-15 ENCOUNTER — Telehealth: Payer: Self-pay | Admitting: Internal Medicine

## 2014-06-15 MED ORDER — ONETOUCH ULTRA 2 W/DEVICE KIT
PACK | Status: DC
Start: 2014-06-15 — End: 2014-11-05

## 2014-06-15 MED ORDER — GLUCOSE BLOOD VI STRP: ORAL_STRIP | Status: DC

## 2014-06-15 MED ORDER — ONETOUCH ULTRASOFT LANCETS MISC: 1.0000 | Freq: Three times a day (TID) | Status: DC

## 2014-06-15 NOTE — Telephone Encounter (Signed)
Patient is requesting one touch ultra reader w/ supplies because of change in insurance.

## 2014-06-15 NOTE — Telephone Encounter (Signed)
Called daughter back verified frequency of testing BS & pharmacy. Sent new supplies to [facility]...Russell [name]

## 2014-06-22 ENCOUNTER — Ambulatory Visit: Payer: PPO

## 2014-07-07 ENCOUNTER — Ambulatory Visit (INDEPENDENT_AMBULATORY_CARE_PROVIDER_SITE_OTHER): Payer: PPO | Admitting: Podiatrist

## 2014-07-07 DIAGNOSIS — M2041 Other hammer toe(s) (acquired), right foot: Secondary | ICD-10-CM | POA: Diagnosis not present

## 2014-07-07 DIAGNOSIS — E1140 Type 2 diabetes mellitus with diabetic neuropathy, unspecified: Secondary | ICD-10-CM | POA: Diagnosis not present

## 2014-07-07 DIAGNOSIS — L84 Corns and callosities: Secondary | ICD-10-CM

## 2014-07-07 DIAGNOSIS — M2042 Other hammer toe(s) (acquired), left foot: Secondary | ICD-10-CM

## 2014-07-07 NOTE — Patient Instructions (Signed)

## 2014-07-07 NOTE — Progress Notes (Signed)
Patient presents today to pick up diabetic shoes.  Relates the shoes and inserts feel good and they are free of defect.  No other concerns present at today's visit  Shoes are dispensed and patient given instructions on wear and use of the diabetic inserts.   We will reappoint for continued care and recheck of the shoes in 2-3 months.  If any questions, concerns or problems arise, patient to call.   

## 2014-07-07 NOTE — Progress Notes (Signed)
Patient ID: Russell [name], male   DOB: Jun 18, 1923, 81 y.o.   MRN: [id number] Patient presents for diabetic shoe pick up. Apex Moc Open toe Black [id number] Men's size 10 wide 3 Pairs custom diabetic accommodative insets Shoes are tried on for good fit. Written break in and wear instructions given

## 2014-08-12 ENCOUNTER — Ambulatory Visit: Payer: PPO | Admitting: Podiatrist

## 2014-09-03 ENCOUNTER — Ambulatory Visit: Payer: Self-pay

## 2014-09-03 ENCOUNTER — Telehealth: Payer: Self-pay | Admitting: Internal Medicine

## 2014-09-03 DIAGNOSIS — M150 Primary generalized (osteo)arthritis: Principal | ICD-10-CM

## 2014-09-03 DIAGNOSIS — M1990 Unspecified osteoarthritis, unspecified site: Secondary | ICD-10-CM

## 2014-09-03 DIAGNOSIS — M159 Polyosteoarthritis, unspecified: Secondary | ICD-10-CM

## 2014-09-03 MED ORDER — HYDROCODONE-ACETAMINOPHEN 5-325 MG PO TABS: 1.0000 | ORAL_TABLET | Freq: Four times a day (QID) | ORAL | Status: DC | PRN

## 2014-09-03 NOTE — Telephone Encounter (Signed)
Requesting a refill of HYDROcodone-acetaminophen (NORCO/VICODIN) 5-325 MG per tablet [[id number]]

## 2014-09-03 NOTE — Telephone Encounter (Signed)
[name] notified/lmovm to pick up.

## 2014-09-03 NOTE — Telephone Encounter (Signed)
OK #[id number] Sch OV w/Dr Ronnald Ramp

## 2014-09-03 NOTE — Telephone Encounter (Signed)
Please advise on behalf of Dr. Ronnald Ramp, pt last seen 04/2014. Thanks

## 2014-09-15 ENCOUNTER — Ambulatory Visit (INDEPENDENT_AMBULATORY_CARE_PROVIDER_SITE_OTHER): Payer: PPO | Admitting: Podiatrist

## 2014-09-15 DIAGNOSIS — E1140 Type 2 diabetes mellitus with diabetic neuropathy, unspecified: Secondary | ICD-10-CM | POA: Diagnosis not present

## 2014-09-15 DIAGNOSIS — M79673 Pain in unspecified foot: Secondary | ICD-10-CM | POA: Diagnosis not present

## 2014-09-15 DIAGNOSIS — B351 Tinea unguium: Secondary | ICD-10-CM

## 2014-09-15 NOTE — Progress Notes (Signed)
Subjective: Patient presents today for followup of foot and toenail care. He denies any change in medications or allergies.    Objective: Neurovascular status reveals palpable pedal pulses at 2+ out of 4 DP and PT bilateral. He does have increasing capillary refill time and decreased hair growth noted to the distal digits bilateral. He also has sensation decreased via Semmes Weinstein monofilament at 3/5 sites bilateral. Light touch is intact vibratory sensation is decreased bilateral. His skin is thick due to swelling. Moderate swelling is also present today. He also has a area of soft tissue lesion or cyst on the dorsal aspect of the left foot. It is not bothersome today. Contracture hammertoe present left second toe and pes planus foot type is noted. The patient also has elongated, thickened, dystrophic, mycotic toenails x10. They're painful and symptomatic.   Assessment: Symptomatic onychomycosis   Plan:Discussed etiology, pathology, conservative vs. Surgical therapies and at this time debridement of symptomatic toenails was recommended. Onychoreduction of symptomatic toenails was performed without iatrogenic incident. Patient was instructed on signs and symptoms of infection and was told to call immediately should any of these arise.

## 2014-09-30 ENCOUNTER — Other Ambulatory Visit: Payer: Self-pay | Admitting: Internal Medicine

## 2014-10-12 ENCOUNTER — Ambulatory Visit: Payer: PPO | Admitting: Internal Medicine

## 2014-11-05 ENCOUNTER — Ambulatory Visit (INDEPENDENT_AMBULATORY_CARE_PROVIDER_SITE_OTHER): Payer: PPO | Admitting: Neurology

## 2014-11-05 ENCOUNTER — Encounter: Payer: Self-pay | Admitting: Neurology

## 2014-11-05 VITALS — BP 138/54 | HR 68 | Resp 16 | Ht 67.0 in | Wt 160.0 lb

## 2014-11-05 DIAGNOSIS — F0391 Unspecified dementia with behavioral disturbance: Secondary | ICD-10-CM | POA: Insufficient documentation

## 2014-11-05 DIAGNOSIS — F0390 Unspecified dementia without behavioral disturbance: Secondary | ICD-10-CM

## 2014-11-05 DIAGNOSIS — F03B18 Unspecified dementia, moderate, with other behavioral disturbance: Secondary | ICD-10-CM | POA: Insufficient documentation

## 2014-11-05 DIAGNOSIS — F03A0 Unspecified dementia, mild, without behavioral disturbance, psychotic disturbance, mood disturbance, and anxiety: Secondary | ICD-10-CM
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
## 2014-11-05 NOTE — Patient Instructions (Addendum)
1. Follow-up in 1 year 2. Call our office if you have decided to start medication (Aricept) 3. Please call your PCP as soon as possible to evaluate irregular heart rate

## 2014-12-08 ENCOUNTER — Ambulatory Visit: Payer: Self-pay | Admitting: Internal Medicine

## 2015-01-25 ENCOUNTER — Encounter: Payer: Self-pay | Admitting: Internal Medicine

## 2015-01-25 ENCOUNTER — Ambulatory Visit (INDEPENDENT_AMBULATORY_CARE_PROVIDER_SITE_OTHER): Payer: PPO | Admitting: Internal Medicine

## 2015-01-25 ENCOUNTER — Other Ambulatory Visit (INDEPENDENT_AMBULATORY_CARE_PROVIDER_SITE_OTHER): Payer: PPO

## 2015-01-25 VITALS — BP 120/58 | HR 69 | Temp 98.1°F | Resp 16 | Ht 67.0 in | Wt 159.0 lb

## 2015-01-25 DIAGNOSIS — Z0000 Encounter for general adult medical examination without abnormal findings: Secondary | ICD-10-CM

## 2015-01-25 DIAGNOSIS — E785 Hyperlipidemia, unspecified: Secondary | ICD-10-CM

## 2015-01-25 DIAGNOSIS — M150 Primary generalized (osteo)arthritis: Secondary | ICD-10-CM | POA: Diagnosis not present

## 2015-01-25 DIAGNOSIS — I10 Essential (primary) hypertension: Secondary | ICD-10-CM

## 2015-01-25 DIAGNOSIS — I5041 Acute combined systolic (congestive) and diastolic (congestive) heart failure: Secondary | ICD-10-CM | POA: Diagnosis not present

## 2015-01-25 DIAGNOSIS — M159 Polyosteoarthritis, unspecified: Secondary | ICD-10-CM

## 2015-01-25 DIAGNOSIS — Z23 Encounter for immunization: Secondary | ICD-10-CM

## 2015-01-25 DIAGNOSIS — R739 Hyperglycemia, unspecified: Secondary | ICD-10-CM

## 2015-01-25 LAB — TSH: TSH: 1.86 u[IU]/mL (ref 0.35–4.50)

## 2015-01-25 LAB — CBC WITH DIFFERENTIAL/PLATELET
BASOS ABS: 0 10*3/uL (ref 0.0–0.1)
Basophils Relative: 0.4 % (ref 0.0–3.0)
Eosinophils Absolute: 0.3 10*3/uL (ref 0.0–0.7)
Eosinophils Relative: 6.3 % — ABNORMAL HIGH (ref 0.0–5.0)
HCT: 38 % — ABNORMAL LOW (ref 39.0–52.0)
Hemoglobin: 12.4 g/dL — ABNORMAL LOW (ref 13.0–17.0)
LYMPHS ABS: 1 10*3/uL (ref 0.7–4.0)
Lymphocytes Relative: 22.3 % (ref 12.0–46.0)
MCHC: 32.7 g/dL (ref 30.0–36.0)
MCV: 89.2 fl (ref 78.0–100.0)
MONO ABS: 0.6 10*3/uL (ref 0.1–1.0)
MONOS PCT: 13.9 % — AB (ref 3.0–12.0)
NEUTROS ABS: 2.5 10*3/uL (ref 1.4–7.7)
NEUTROS PCT: 57.1 % (ref 43.0–77.0)
PLATELETS: 170 10*3/uL (ref 150.0–400.0)
RBC: 4.26 Mil/uL (ref 4.22–5.81)
RDW: 15.4 % (ref 11.5–15.5)
WBC: 4.4 10*3/uL (ref 4.0–10.5)

## 2015-01-25 LAB — COMPREHENSIVE METABOLIC PANEL
ALT: 16 U/L (ref 0–53)
AST: 22 U/L (ref 0–37)
Albumin: 3.4 g/dL — ABNORMAL LOW (ref 3.5–5.2)
Alkaline Phosphatase: 91 U/L (ref 39–117)
BILIRUBIN TOTAL: 0.4 mg/dL (ref 0.2–1.2)
BUN: 37 mg/dL — AB (ref 6–23)
CO2: 31 meq/L (ref 19–32)
CREATININE: 1.26 mg/dL (ref 0.40–1.50)
Calcium: 9 mg/dL (ref 8.4–10.5)
Chloride: 108 mEq/L (ref 96–112)
GFR: 69.23 mL/min (ref >60.00)
GLUCOSE: 82 mg/dL (ref 70–99)
Potassium: 4.2 mEq/L (ref 3.5–5.1)
SODIUM: 144 meq/L (ref 135–145)
TOTAL PROTEIN: 6 g/dL (ref 6.0–8.3)

## 2015-01-25 LAB — LIPID PANEL
CHOLESTEROL: 124 mg/dL (ref 0–200)
HDL: 46.5 mg/dL (ref >39.00)
LDL Cholesterol: 63 mg/dL (ref 0–99)
NONHDL: 77.68
Total CHOL/HDL Ratio: 3
Triglycerides: 74 mg/dL (ref 0.0–149.0)
VLDL: 14.8 mg/dL (ref 0.0–40.0)

## 2015-01-25 LAB — HEMOGLOBIN A1C: HEMOGLOBIN A1C: 5.8 % (ref 4.6–6.5)

## 2015-01-25 LAB — MICROALBUMIN / CREATININE URINE RATIO
CREATININE, U: 110.2 mg/dL
MICROALB UR: 2.2 mg/dL — AB (ref 0.0–1.9)
MICROALB/CREAT RATIO: 2 mg/g (ref 0.0–30.0)

## 2015-01-25 MED ORDER — HYDROCODONE-ACETAMINOPHEN 5-325 MG PO TABS: 1.0000 | ORAL_TABLET | Freq: Four times a day (QID) | ORAL | Status: DC | PRN

## 2015-01-25 NOTE — Progress Notes (Signed)
Subjective:  Patient ID: Russell [name], male    DOB: 1923-10-16  Age: 81 y.o. MRN: [id number]  CC: Congestive Heart Failure and Hypertension   HPI Russell [name] presents for f/up and he complains of chronic bilateral knee pain, he ran out of norco and requests a refill. He offers no other new complaints.  Outpatient Prescriptions Prior to Visit  Medication Sig Dispense Refill  . aspirin 81 MG tablet Take 81 mg by mouth daily.      . B Complex Vitamins (VITAMIN-B COMPLEX PO) Take 1 tablet by mouth daily.     . fesoterodine (TOVIAZ) 8 MG TB24 tablet Take 1 tablet (8 mg total) by mouth daily. 30 tablet 11  . hydrochlorothiazide (HYDRODIURIL) 25 MG tablet Take 1 tablet (25 mg total) by mouth daily. 90 tablet 3  . losartan (COZAAR) 100 MG tablet Take 1 tablet (100 mg total) by mouth daily. 90 tablet 3  . sertraline (ZOLOFT) 100 MG tablet Take 1 tablet (100 mg total) by mouth daily. 90 tablet 3  . simvastatin (ZOCOR) 80 MG tablet Take 0.5 tablets (40 mg total) by mouth every evening. 90 tablet 3  . triamcinolone cream (KENALOG) 0.1 % Apply 1 application topically 2 (two) times daily.     Marland Kitchen ALPRAZolam (XANAX) 0.5 MG tablet TAKE 1 TABLET BY MOUTH THREE TIMES DAILY AS NEEDED FOR ANXIETY 60 tablet 2   No facility-administered medications prior to visit.    ROS Review of Systems  Constitutional: Negative.  Negative for fever, chills, diaphoresis, appetite change and fatigue.  HENT: Negative.   Eyes: Negative.   Respiratory: Negative.  Negative for cough, choking, chest tightness, shortness of breath and stridor.   Cardiovascular: Negative.  Negative for chest pain, palpitations and leg swelling.  Gastrointestinal: Negative.  Negative for nausea, vomiting, abdominal pain, diarrhea, constipation and blood in stool.  Endocrine: Negative.   Genitourinary: Negative.  Negative for dysuria, urgency, hematuria, flank pain and difficulty urinating.  Musculoskeletal: Positive for arthralgias. Negative  for myalgias, back pain and joint swelling.  Skin: Negative.  Negative for rash.  Allergic/Immunologic: Negative.   Neurological: Negative.  Negative for dizziness, tremors, syncope, light-headedness, numbness and headaches.  Hematological: Negative.  Negative for adenopathy. Does not bruise/bleed easily.  Psychiatric/Behavioral: Positive for sleep disturbance. Negative for suicidal ideas, behavioral problems, self-injury, dysphoric mood, decreased concentration and agitation. The patient is nervous/anxious.     Objective:  BP 120/58 mmHg  Pulse 69  Temp(Src) 98.1 F (36.7 C) (Oral)  Resp 16  Ht 5\' 7"  (1.702 m)  Wt 159 lb (72.122 kg)  BMI 24.90 kg/m2  SpO2 99%  BP Readings from Last 3 Encounters:  01/25/15 120/58  11/05/14 138/54  05/13/14 136/70    Wt Readings from Last 3 Encounters:  01/25/15 159 lb (72.122 kg)  11/05/14 160 lb (72.576 kg)  05/13/14 158 lb (71.668 kg)    Physical Exam  Constitutional: He is oriented to person, place, and time. No distress.  HENT:  Mouth/Throat: Oropharynx is clear and moist. No oropharyngeal exudate.  Eyes: Conjunctivae are normal. Right eye exhibits no discharge. Left eye exhibits no discharge. No scleral icterus.  Neck: Normal range of motion. Neck supple. No JVD present. No tracheal deviation present. No thyromegaly present.  Cardiovascular: Normal rate, regular rhythm, normal heart sounds and intact distal pulses.  Exam reveals no gallop and no friction rub.   No murmur heard. Pulmonary/Chest: Effort normal and breath sounds normal. No stridor. No respiratory distress. He has no wheezes.  He has no rales. He exhibits no tenderness.  Abdominal: Soft. Bowel sounds are normal. He exhibits no distension and no mass. There is no tenderness. There is no rebound and no guarding.  Musculoskeletal: Normal range of motion. He exhibits no edema or tenderness.       Right knee: He exhibits deformity (DJD). He exhibits normal range of motion, no  swelling, no effusion and no erythema. No tenderness found.       Left knee: He exhibits deformity (DJD). He exhibits normal range of motion, no swelling, no effusion and no erythema. No tenderness found.  Lymphadenopathy:    He has no cervical adenopathy.  Neurological: He is oriented to person, place, and time.  Skin: Skin is warm and dry. No rash noted. He is not diaphoretic. No erythema. No pallor.  Vitals reviewed.   Lab Results  Component Value Date   WBC 4.4 01/25/2015   HGB 12.4* 01/25/2015   HCT 38.0* 01/25/2015   PLT 170.0 01/25/2015   GLUCOSE 82 01/25/2015   CHOL 124 01/25/2015   TRIG 74.0 01/25/2015   HDL 46.50 01/25/2015   LDLCALC 63 01/25/2015   ALT 16 01/25/2015   AST 22 01/25/2015   NA 144 01/25/2015   K 4.2 01/25/2015   CL 108 01/25/2015   CREATININE 1.26 01/25/2015   BUN 37* 01/25/2015   CO2 31 01/25/2015   TSH 1.86 01/25/2015   PSA 1.94 02/20/2013   HGBA1C 5.8 01/25/2015   MICROALBUR 2.2* 01/25/2015    No results found.  Assessment & Plan:   Russell [name] was seen today for congestive heart failure and hypertension.  Diagnoses and all orders for this visit:  Essential hypertension, benign- his BP is well controlled -     TSH; Future  Acute combined systolic and diastolic heart failure - EF 42% on echo 12/2013- medical therapy- he has a normal volume status and is doing well, will cont current meds, his lytes are normal -     Comprehensive metabolic panel; Future  Hyperlipidemia with target LDL less than 100- he has achieved his LDL goal -     TSH; Future -     Comprehensive metabolic panel; Future -     CBC with Differential/Platelet; Future -     Lipid panel; Future  Primary osteoarthritis involving multiple joints- will restart norco for pain -     Discontinue: HYDROcodone-acetaminophen (NORCO/VICODIN) 5-325 MG tablet; Take 1 tablet by mouth every 6 (six) hours as needed for moderate pain. -     Discontinue: HYDROcodone-acetaminophen  (NORCO/VICODIN) 5-325 MG tablet; Take 1 tablet by mouth every 6 (six) hours as needed for moderate pain. -     HYDROcodone-acetaminophen (NORCO/VICODIN) 5-325 MG tablet; Take 1 tablet by mouth every 6 (six) hours as needed for moderate pain.  Hyperglycemia- he has prediabetes, will monitor -     Microalbumin / creatinine urine ratio; Future -     Hemoglobin A1c; Future  Need for influenza vaccination -     Flu vaccine HIGH DOSE PF  Need for 23-polyvalent pneumococcal polysaccharide vaccine -     Pneumococcal polysaccharide vaccine 23-valent greater than or equal to 2yo subcutaneous/IM  I am having Russell [name] maintain his aspirin, B Complex Vitamins (VITAMIN-B COMPLEX PO), triamcinolone cream, fesoterodine, hydrochlorothiazide, losartan, sertraline, simvastatin, ONE TOUCH ULTRA TEST, ONETOUCH DELICA LANCETS 16X, and HYDROcodone-acetaminophen.  Meds ordered this encounter  Medications  . ONE TOUCH ULTRA TEST test strip    Sig: USE TO CHECK BLOOD SUGARS UP TO  TID    Refill:  5  . ONETOUCH DELICA LANCETS 38H MISC    Sig: USE TO TEST BLOOD SUGAR UP TO THREE TIMES DAILY    Refill:  4  . DISCONTD: HYDROcodone-acetaminophen (NORCO/VICODIN) 5-325 MG tablet    Sig: Take 1 tablet by mouth every 6 (six) hours as needed for moderate pain.    Dispense:  65 tablet    Refill:  0  . DISCONTD: HYDROcodone-acetaminophen (NORCO/VICODIN) 5-325 MG tablet    Sig: Take 1 tablet by mouth every 6 (six) hours as needed for moderate pain.    Dispense:  65 tablet    Refill:  0  . HYDROcodone-acetaminophen (NORCO/VICODIN) 5-325 MG tablet    Sig: Take 1 tablet by mouth every 6 (six) hours as needed for moderate pain.    Dispense:  65 tablet    Refill:  0   See AVS for instructions about healthy living and anticipatory guidance.  Follow-up: Return in about 4 months (around 05/27/2015).  Scarlette Calico, MD

## 2015-01-25 NOTE — Progress Notes (Signed)
Pre visit review using our clinic review tool, if applicable. No additional management support is needed unless otherwise documented below in the visit note. 

## 2015-01-25 NOTE — Patient Instructions (Signed)

## 2015-01-26 ENCOUNTER — Other Ambulatory Visit: Payer: Self-pay | Admitting: Internal Medicine

## 2015-01-26 ENCOUNTER — Encounter: Payer: Self-pay | Admitting: Internal Medicine

## 2015-01-26 NOTE — Telephone Encounter (Signed)
Pt Daughter called in and said that she will be the one we call for the results of the labs.  She also wanted to know about the xanex script

## 2015-01-27 DIAGNOSIS — Z0000 Encounter for general adult medical examination without abnormal findings: Secondary | ICD-10-CM | POA: Insufficient documentation

## 2015-01-27 MED ORDER — ALPRAZOLAM 0.5 MG PO TABS: 0.5000 mg | ORAL_TABLET | Freq: Three times a day (TID) | ORAL | Status: DC | PRN

## 2015-01-27 NOTE — Telephone Encounter (Signed)
Med pended for RF. I see a letter was mailed out will call to give results

## 2015-01-27 NOTE — Telephone Encounter (Signed)
Pt informed of labs. Script has been faxed

## 2015-01-27 NOTE — Assessment & Plan Note (Signed)

## 2015-02-07 ENCOUNTER — Other Ambulatory Visit: Payer: Self-pay | Admitting: Internal Medicine

## 2015-02-22 ENCOUNTER — Ambulatory Visit (INDEPENDENT_AMBULATORY_CARE_PROVIDER_SITE_OTHER): Payer: PPO | Admitting: Podiatry

## 2015-02-22 ENCOUNTER — Encounter: Payer: Self-pay | Admitting: Podiatry

## 2015-02-22 DIAGNOSIS — B351 Tinea unguium: Secondary | ICD-10-CM | POA: Diagnosis not present

## 2015-02-22 DIAGNOSIS — M79675 Pain in left toe(s): Secondary | ICD-10-CM | POA: Diagnosis not present

## 2015-02-22 DIAGNOSIS — M79674 Pain in right toe(s): Secondary | ICD-10-CM

## 2015-02-22 DIAGNOSIS — Z7689 Persons encountering health services in other specified circumstances: Secondary | ICD-10-CM

## 2015-02-22 NOTE — Progress Notes (Signed)
Patient ID: Russell [name], male   DOB: [birth year], 81 y.o.   MRN: [id number]  Subjective: This patient presents today complaining of painful toenails walking wearing shoes and is requesting nail debridement  Objective: Patient appears orientated 3 DP and PT pulses 2/4 bilaterally No open skin lesions noted bilaterally Mild distal keratoses hallux bilaterally The toenails are elongated, brittle, deformed, hypertrophic and tender to direct palpation 6-10  Assessment: Symptomatic onychomycoses 6-10  Plan: Debridement toenails 10 mechanical and electrically without any bleeding  Reappoint 3 months

## 2015-04-06 ENCOUNTER — Ambulatory Visit (INDEPENDENT_AMBULATORY_CARE_PROVIDER_SITE_OTHER): Payer: PPO | Admitting: Internal Medicine

## 2015-04-06 ENCOUNTER — Encounter: Payer: Self-pay | Admitting: Internal Medicine

## 2015-04-06 ENCOUNTER — Ambulatory Visit (INDEPENDENT_AMBULATORY_CARE_PROVIDER_SITE_OTHER)
Admission: RE | Admit: 2015-04-06 | Discharge: 2015-04-06 | Disposition: A | Discharge status: Home / Self Care | Payer: PPO | Source: Ambulatory Visit | Attending: Internal Medicine | Admitting: Internal Medicine

## 2015-04-06 VITALS — BP 130/90 | HR 88 | Temp 99.5°F | Resp 16 | Ht 66.0 in | Wt 170.0 lb

## 2015-04-06 DIAGNOSIS — R05 Cough: Secondary | ICD-10-CM

## 2015-04-06 DIAGNOSIS — R059 Cough, unspecified: Secondary | ICD-10-CM

## 2015-04-06 DIAGNOSIS — J209 Acute bronchitis, unspecified: Secondary | ICD-10-CM | POA: Diagnosis not present

## 2015-04-06 MED ORDER — PROMETHAZINE-DM 6.25-15 MG/5ML PO SYRP: 5.0000 mL | ORAL_SOLUTION | Freq: Four times a day (QID) | ORAL | Status: DC | PRN

## 2015-04-06 MED ORDER — AMOXICILLIN-POT CLAVULANATE 875-125 MG PO TABS: 1.0000 | ORAL_TABLET | Freq: Two times a day (BID) | ORAL | Status: AC

## 2015-04-06 NOTE — Patient Instructions (Signed)

## 2015-04-06 NOTE — Progress Notes (Signed)
Pre visit review using our clinic review tool, if applicable. No additional management support is needed unless otherwise documented below in the visit note. 

## 2015-04-07 NOTE — Progress Notes (Signed)
Subjective:  Patient ID: Russell [name], male    DOB: [birth year]  Age: 81 y.o. MRN: [id number]  CC: Cough   HPI Russell [name] presents for a 2 week history of cough productive of thick yellow mucus with fatigue and weakness as well as chills.  Outpatient Prescriptions Prior to Visit  Medication Sig Dispense Refill  . ALPRAZolam (XANAX) 0.5 MG tablet Take 1 tablet (0.5 mg total) by mouth 3 (three) times daily as needed. for anxiety 60 tablet 3  . aspirin 81 MG tablet Take 81 mg by mouth daily.      . B Complex Vitamins (VITAMIN-B COMPLEX PO) Take 1 tablet by mouth daily.     . fesoterodine (TOVIAZ) 8 MG TB24 tablet Take 1 tablet (8 mg total) by mouth daily. 30 tablet 11  . hydrochlorothiazide (HYDRODIURIL) 25 MG tablet Take 1 tablet (25 mg total) by mouth daily. 90 tablet 3  . HYDROcodone-acetaminophen (NORCO/VICODIN) 5-325 MG tablet Take 1 tablet by mouth every 6 (six) hours as needed for moderate pain. 65 tablet 0  . losartan (COZAAR) 100 MG tablet Take 1 tablet (100 mg total) by mouth daily. 90 tablet 3  . ONE TOUCH ULTRA TEST test strip USE TO CHECK BLOOD SUGARS UP TO THREE TIMES DAILY 100 each 11  . ONETOUCH DELICA LANCETS 99991111 MISC USE TO TEST BLOOD SUGAR UP TO THREE TIMES DAILY  4  . sertraline (ZOLOFT) 100 MG tablet Take 1 tablet (100 mg total) by mouth daily. 90 tablet 3  . simvastatin (ZOCOR) 80 MG tablet Take 0.5 tablets (40 mg total) by mouth every evening. 90 tablet 3  . triamcinolone cream (KENALOG) 0.1 % Apply 1 application topically 2 (two) times daily.      No facility-administered medications prior to visit.    ROS Review of Systems  Constitutional: Positive for chills and fatigue. Negative for fever, diaphoresis, activity change, appetite change and unexpected weight change.  HENT: Negative.   Eyes: Negative.   Respiratory: Positive for cough and shortness of breath. Negative for apnea, choking, chest tightness, wheezing and stridor.   Cardiovascular: Negative.   Negative for chest pain, palpitations and leg swelling.  Gastrointestinal: Negative.  Negative for nausea, vomiting, abdominal pain, diarrhea and constipation.  Endocrine: Negative.   Genitourinary: Negative.   Musculoskeletal: Negative.  Negative for myalgias, back pain, joint swelling and arthralgias.  Skin: Negative.  Negative for color change and rash.  Allergic/Immunologic: Negative.   Neurological: Positive for weakness.  Hematological: Negative.  Negative for adenopathy. Does not bruise/bleed easily.  Psychiatric/Behavioral: Negative.     Objective:  BP 130/90 mmHg  Pulse 88  Temp(Src) 99.5 F (37.5 C) (Oral)  Resp 16  Ht 5\' 6"  (1.676 m)  Wt 170 lb (77.111 kg)  BMI 27.45 kg/m2  SpO2 92%  BP Readings from Last 3 Encounters:  04/06/15 130/90  01/25/15 120/58  11/05/14 138/54    Wt Readings from Last 3 Encounters:  04/06/15 170 lb (77.111 kg)  01/25/15 159 lb (72.122 kg)  11/05/14 160 lb (72.576 kg)    Physical Exam  Constitutional: He is oriented to person, place, and time. No distress.  HENT:  Head: Normocephalic and atraumatic.  Mouth/Throat: Oropharynx is clear and moist. No oropharyngeal exudate.  Eyes: Conjunctivae are normal. Right eye exhibits no discharge. Left eye exhibits no discharge. No scleral icterus.  Neck: Normal range of motion. Neck supple. No JVD present. No tracheal deviation present. No thyromegaly present.  Cardiovascular: Normal rate, regular rhythm, normal heart  sounds and intact distal pulses.  Exam reveals no gallop and no friction rub.   No murmur heard. Pulmonary/Chest: Effort normal and breath sounds normal. No stridor. No respiratory distress. He has no wheezes. He has no rales. He exhibits no tenderness.  Abdominal: Soft. Bowel sounds are normal. He exhibits no distension and no mass. There is no tenderness. There is no rebound and no guarding.  Musculoskeletal: Normal range of motion. He exhibits no edema or tenderness.    Lymphadenopathy:    He has no cervical adenopathy.  Neurological: He is oriented to person, place, and time.  Skin: Skin is warm and dry. No rash noted. He is not diaphoretic. No erythema. No pallor.  Vitals reviewed.   Lab Results  Component Value Date   WBC 4.4 01/25/2015   HGB 12.4* 01/25/2015   HCT 38.0* 01/25/2015   PLT 170.0 01/25/2015   GLUCOSE 82 01/25/2015   CHOL 124 01/25/2015   TRIG 74.0 01/25/2015   HDL 46.50 01/25/2015   LDLCALC 63 01/25/2015   ALT 16 01/25/2015   AST 22 01/25/2015   NA 144 01/25/2015   K 4.2 01/25/2015   CL 108 01/25/2015   CREATININE 1.26 01/25/2015   BUN 37* 01/25/2015   CO2 31 01/25/2015   TSH 1.86 01/25/2015   PSA 1.94 02/20/2013   HGBA1C 5.8 01/25/2015   MICROALBUR 2.2* 01/25/2015    Dg Chest 2 View  04/06/2015  CLINICAL DATA:  Cough and congestion with shortness of Breath EXAM: CHEST - 2 VIEW COMPARISON:  09/07/2008 FINDINGS: Cardiac shadow is stable. The lungs are well aerated bilaterally. A calcific density is noted over the anterior aspect of the fourth rib consistent with a calcified granuloma in stable from the prior exam. No new focal infiltrate or sizable effusion is seen. No acute bony abnormality is noted. IMPRESSION: No acute abnormality seen. Electronically Signed   By: Inez Catalina M.D.   On: 04/06/2015 17:00    Assessment & Plan:   Russell [name] was seen today for cough.  Diagnoses and all orders for this visit:  Cough- his chest x-ray is unremarkable, there is no effusion, mass, infiltrate or edema. -     DG Chest 2 View; Future -     promethazine-dextromethorphan (PROMETHAZINE-DM) 6.25-15 MG/5ML syrup; Take 5 mLs by mouth 4 (four) times daily as needed for cough.  Acute bronchitis, unspecified organism- I will treat the infection with Augmentin and will control his symptoms with Phenergan DM. -     promethazine-dextromethorphan (PROMETHAZINE-DM) 6.25-15 MG/5ML syrup; Take 5 mLs by mouth 4 (four) times daily as needed for  cough. -     amoxicillin-clavulanate (AUGMENTIN) 875-125 MG tablet; Take 1 tablet by mouth 2 (two) times daily.   I am having [name] start on promethazine-dextromethorphan and amoxicillin-clavulanate. I am also having him maintain his aspirin, B Complex Vitamins (VITAMIN-B COMPLEX PO), triamcinolone cream, fesoterodine, hydrochlorothiazide, losartan, sertraline, simvastatin, ONETOUCH DELICA LANCETS 99991111, HYDROcodone-acetaminophen, ALPRAZolam, and ONE TOUCH ULTRA TEST.  Meds ordered this encounter  Medications  . promethazine-dextromethorphan (PROMETHAZINE-DM) 6.25-15 MG/5ML syrup    Sig: Take 5 mLs by mouth 4 (four) times daily as needed for cough.    Dispense:  118 mL    Refill:  0  . amoxicillin-clavulanate (AUGMENTIN) 875-125 MG tablet    Sig: Take 1 tablet by mouth 2 (two) times daily.    Dispense:  20 tablet    Refill:  0     Follow-up: Return in about 3 weeks (around 04/27/2015).  Scarlette Calico, MD

## 2015-04-14 ENCOUNTER — Telehealth: Payer: Self-pay | Admitting: *Deleted

## 2015-04-14 DIAGNOSIS — J209 Acute bronchitis, unspecified: Secondary | ICD-10-CM

## 2015-04-14 DIAGNOSIS — R05 Cough: Secondary | ICD-10-CM

## 2015-04-14 DIAGNOSIS — R059 Cough, unspecified: Secondary | ICD-10-CM

## 2015-04-14 MED ORDER — POLYETHYLENE GLYCOL 3350 17 GM/SCOOP PO POWD: 17.0000 g | Freq: Two times a day (BID) | ORAL | Status: DC | PRN

## 2015-04-14 MED ORDER — PROMETHAZINE-DM 6.25-15 MG/5ML PO SYRP: 5.0000 mL | ORAL_SOLUTION | Freq: Four times a day (QID) | ORAL | Status: DC | PRN

## 2015-04-14 NOTE — Telephone Encounter (Signed)
Left msg on triage stating father saw md last week he still have the cough spitting up white Phlegm, and now c/o of constipated. Wanting to know would md rx cough syrup & recommend something for constipation...[name]

## 2015-04-14 NOTE — Telephone Encounter (Signed)
Prescriptions have been sent to his pharmacy.

## 2015-04-14 NOTE — Telephone Encounter (Signed)
Notified daughter md sent rx's to [facility]...Russell [name]

## 2015-04-22 ENCOUNTER — Other Ambulatory Visit: Payer: Self-pay | Admitting: Internal Medicine

## 2015-05-22 ENCOUNTER — Other Ambulatory Visit: Payer: Self-pay | Admitting: Internal Medicine

## 2015-05-24 ENCOUNTER — Telehealth: Payer: Self-pay | Admitting: *Deleted

## 2015-05-24 DIAGNOSIS — K5901 Slow transit constipation: Secondary | ICD-10-CM

## 2015-05-24 NOTE — Telephone Encounter (Signed)
Notified pt daughter with md response. Inform her Dr. Ronnald Ramp will be back in the office next Monday & I will give her a call once he return concerning the referral to GI...[name]

## 2015-05-24 NOTE — Telephone Encounter (Signed)
Daughter left msg on triage stating Dr. Ronnald Ramp rx miralax for dad to take he has been taking, but dad is having constipation sxs again. Miralax is not helping. He hasn't had a BM in 3-4 days. Wanting to know what else he can take. Also should dad see Gastroenterologist to see why he keep having constipation problem. Dad was hospitalize because his intestine was back up. MD out of office pls advise.Marland KitchenJohny Chess

## 2015-05-24 NOTE — Telephone Encounter (Signed)
Can take magnesium citrate over the counter for the constipation. Also once has BM recommend daily senokot-d. Can discuss with PCP whether GI referral necessary.

## 2015-05-30 DIAGNOSIS — K5900 Constipation, unspecified: Secondary | ICD-10-CM | POA: Insufficient documentation

## 2015-05-30 MED ORDER — LINACLOTIDE 145 MCG PO CAPS: 145.0000 ug | ORAL_CAPSULE | Freq: Every day | ORAL | Status: DC

## 2015-05-30 NOTE — Telephone Encounter (Signed)
Try linzess Rx sent to pharmacy I also have samples

## 2015-05-30 NOTE — Telephone Encounter (Signed)
Notified pt daughter with md response.../lmb 

## 2015-06-01 ENCOUNTER — Other Ambulatory Visit: Payer: Self-pay | Admitting: Internal Medicine

## 2015-06-20 ENCOUNTER — Encounter: Payer: Self-pay | Admitting: Neurology

## 2015-07-06 ENCOUNTER — Other Ambulatory Visit: Payer: Self-pay | Admitting: Internal Medicine

## 2015-07-07 NOTE — Telephone Encounter (Signed)
Pt's daughter [name] calling in regarding pts sertraline. Can you please call her when its been sent in.  Can you please give her a call when its been sent out. If its after 11:30 please call [phone] otherwise call home number

## 2015-07-08 NOTE — Telephone Encounter (Signed)
Refills has been sent.../lmb 

## 2015-07-11 DIAGNOSIS — N3941 Urge incontinence: Secondary | ICD-10-CM | POA: Diagnosis not present

## 2015-07-11 DIAGNOSIS — Z0000 Encounter for general adult medical examination without abnormal findings: Secondary | ICD-10-CM | POA: Diagnosis not present

## 2015-07-16 ENCOUNTER — Other Ambulatory Visit: Payer: Self-pay | Admitting: Internal Medicine

## 2015-07-19 ENCOUNTER — Encounter (HOSPITAL_COMMUNITY): Payer: Self-pay | Admitting: Emergency Medicine

## 2015-07-19 ENCOUNTER — Observation Stay (HOSPITAL_COMMUNITY)
Admission: EM | Admit: 2015-07-19 | Discharge: 2015-07-20 | Disposition: A | Discharge status: Home / Self Care | Payer: PPO | Attending: Internal Medicine | Admitting: Internal Medicine

## 2015-07-19 DIAGNOSIS — Z7982 Long term (current) use of aspirin: Secondary | ICD-10-CM | POA: Diagnosis not present

## 2015-07-19 DIAGNOSIS — Z79899 Other long term (current) drug therapy: Secondary | ICD-10-CM | POA: Diagnosis not present

## 2015-07-19 DIAGNOSIS — I493 Ventricular premature depolarization: Secondary | ICD-10-CM | POA: Diagnosis present

## 2015-07-19 DIAGNOSIS — F418 Other specified anxiety disorders: Secondary | ICD-10-CM | POA: Insufficient documentation

## 2015-07-19 DIAGNOSIS — I110 Hypertensive heart disease with heart failure: Secondary | ICD-10-CM | POA: Insufficient documentation

## 2015-07-19 DIAGNOSIS — E785 Hyperlipidemia, unspecified: Secondary | ICD-10-CM | POA: Diagnosis not present

## 2015-07-19 DIAGNOSIS — E860 Dehydration: Secondary | ICD-10-CM | POA: Insufficient documentation

## 2015-07-19 DIAGNOSIS — F0391 Unspecified dementia with behavioral disturbance: Secondary | ICD-10-CM | POA: Diagnosis present

## 2015-07-19 DIAGNOSIS — I5041 Acute combined systolic (congestive) and diastolic (congestive) heart failure: Secondary | ICD-10-CM | POA: Diagnosis not present

## 2015-07-19 DIAGNOSIS — Z87891 Personal history of nicotine dependence: Secondary | ICD-10-CM | POA: Diagnosis not present

## 2015-07-19 DIAGNOSIS — N179 Acute kidney failure, unspecified: Secondary | ICD-10-CM | POA: Diagnosis not present

## 2015-07-19 DIAGNOSIS — F03B18 Unspecified dementia, moderate, with other behavioral disturbance: Secondary | ICD-10-CM | POA: Diagnosis present

## 2015-07-19 DIAGNOSIS — Z923 Personal history of irradiation: Secondary | ICD-10-CM | POA: Diagnosis not present

## 2015-07-19 DIAGNOSIS — Z85038 Personal history of other malignant neoplasm of large intestine: Secondary | ICD-10-CM | POA: Insufficient documentation

## 2015-07-19 DIAGNOSIS — R32 Unspecified urinary incontinence: Secondary | ICD-10-CM | POA: Insufficient documentation

## 2015-07-19 DIAGNOSIS — R531 Weakness: Secondary | ICD-10-CM | POA: Diagnosis not present

## 2015-07-19 DIAGNOSIS — Z9079 Acquired absence of other genital organ(s): Secondary | ICD-10-CM | POA: Insufficient documentation

## 2015-07-19 DIAGNOSIS — N390 Urinary tract infection, site not specified: Principal | ICD-10-CM

## 2015-07-19 DIAGNOSIS — R55 Syncope and collapse: Secondary | ICD-10-CM | POA: Diagnosis not present

## 2015-07-19 DIAGNOSIS — Z8546 Personal history of malignant neoplasm of prostate: Secondary | ICD-10-CM | POA: Insufficient documentation

## 2015-07-19 DIAGNOSIS — Z9049 Acquired absence of other specified parts of digestive tract: Secondary | ICD-10-CM | POA: Diagnosis not present

## 2015-07-19 DIAGNOSIS — R404 Transient alteration of awareness: Secondary | ICD-10-CM | POA: Diagnosis not present

## 2015-07-19 DIAGNOSIS — R001 Bradycardia, unspecified: Secondary | ICD-10-CM | POA: Insufficient documentation

## 2015-07-19 DIAGNOSIS — F0390 Unspecified dementia without behavioral disturbance: Secondary | ICD-10-CM | POA: Insufficient documentation

## 2015-07-19 DIAGNOSIS — I10 Essential (primary) hypertension: Secondary | ICD-10-CM | POA: Diagnosis present

## 2015-07-19 DIAGNOSIS — E119 Type 2 diabetes mellitus without complications: Secondary | ICD-10-CM | POA: Diagnosis not present

## 2015-07-19 LAB — CBC WITH DIFFERENTIAL/PLATELET
BASOS PCT: 0 %
Basophils Absolute: 0 10*3/uL (ref 0.0–0.1)
EOS PCT: 4 %
Eosinophils Absolute: 0.2 10*3/uL (ref 0.0–0.7)
HEMATOCRIT: 38.5 % — AB (ref 39.0–52.0)
Hemoglobin: 12.8 g/dL — ABNORMAL LOW (ref 13.0–17.0)
Lymphocytes Relative: 16 %
Lymphs Abs: 0.7 10*3/uL (ref 0.7–4.0)
MCH: 29.3 pg (ref 26.0–34.0)
MCHC: 33.2 g/dL (ref 30.0–36.0)
MCV: 88.1 fL (ref 78.0–100.0)
MONO ABS: 0.3 10*3/uL (ref 0.1–1.0)
MONOS PCT: 7 %
NEUTROS ABS: 3.1 10*3/uL (ref 1.7–7.7)
Neutrophils Relative %: 73 %
PLATELETS: 141 10*3/uL — AB (ref 150–400)
RBC: 4.37 MIL/uL (ref 4.22–5.81)
RDW: 15.2 % (ref 11.5–15.5)
WBC: 4.3 10*3/uL (ref 4.0–10.5)

## 2015-07-19 LAB — COMPREHENSIVE METABOLIC PANEL
ALBUMIN: 3.3 g/dL — AB (ref 3.5–5.0)
ALT: 12 U/L — ABNORMAL LOW (ref 17–63)
ANION GAP: 10 (ref 5–15)
AST: 21 U/L (ref 15–41)
Alkaline Phosphatase: 65 U/L (ref 38–126)
BILIRUBIN TOTAL: 0.6 mg/dL (ref 0.3–1.2)
BUN: 28 mg/dL — ABNORMAL HIGH (ref 6–20)
CO2: 26 mmol/L (ref 22–32)
Calcium: 9 mg/dL (ref 8.9–10.3)
Chloride: 106 mmol/L (ref 101–111)
Creatinine, Ser: 1.52 mg/dL — ABNORMAL HIGH (ref 0.61–1.24)
GFR calc Af Amer: 45 mL/min — ABNORMAL LOW (ref >60)
GFR, EST NON AFRICAN AMERICAN: 39 mL/min — AB (ref >60)
Glucose, Bld: 116 mg/dL — ABNORMAL HIGH (ref 65–99)
POTASSIUM: 4 mmol/L (ref 3.5–5.1)
Sodium: 142 mmol/L (ref 135–145)
TOTAL PROTEIN: 5.9 g/dL — AB (ref 6.5–8.1)

## 2015-07-19 LAB — URINALYSIS, ROUTINE W REFLEX MICROSCOPIC
BILIRUBIN URINE: NEGATIVE
Glucose, UA: NEGATIVE mg/dL
HGB URINE DIPSTICK: NEGATIVE
KETONES UR: NEGATIVE mg/dL
NITRITE: NEGATIVE
Protein, ur: NEGATIVE mg/dL
SPECIFIC GRAVITY, URINE: 1.014 (ref 1.005–1.030)
pH: 6.5 (ref 5.0–8.0)

## 2015-07-19 LAB — URINE MICROSCOPIC-ADD ON

## 2015-07-19 LAB — GLUCOSE, CAPILLARY
GLUCOSE-CAPILLARY: 123 mg/dL — AB (ref 65–99)
Glucose-Capillary: 59 mg/dL — ABNORMAL LOW (ref 65–99)
Glucose-Capillary: 150 mg/dL — ABNORMAL HIGH (ref 65–99)

## 2015-07-19 LAB — I-STAT TROPONIN, ED: TROPONIN I, POC: 0.01 ng/mL (ref 0.00–0.08)

## 2015-07-19 LAB — MAGNESIUM: Magnesium: 1.8 mg/dL (ref 1.7–2.4)

## 2015-07-19 MED ORDER — INSULIN ASPART 100 UNIT/ML ~~LOC~~ SOLN: 0.0000 [IU] | Freq: Every day | SUBCUTANEOUS | Status: DC

## 2015-07-19 MED ORDER — SODIUM CHLORIDE 0.9% FLUSH
3.0000 mL | Freq: Two times a day (BID) | INTRAVENOUS | Status: DC
  Administered 2015-07-19: 3 mL via INTRAVENOUS

## 2015-07-19 MED ORDER — SODIUM CHLORIDE 0.9 % IV SOLN
INTRAVENOUS | Status: DC
  Administered 2015-07-19: 21:00:00 via INTRAVENOUS

## 2015-07-19 MED ORDER — LOSARTAN POTASSIUM 50 MG PO TABS: 100.0000 mg | ORAL_TABLET | Freq: Every day | ORAL | Status: DC

## 2015-07-19 MED ORDER — DEXTROSE 5 % IV SOLN
1.0000 g | INTRAVENOUS | Status: DC
  Administered 2015-07-20: 1 g via INTRAVENOUS
  Filled 2015-07-19: qty 10

## 2015-07-19 MED ORDER — DEXTROSE 5 % IV SOLN
1.0000 g | Freq: Once | INTRAVENOUS | Status: AC
  Administered 2015-07-19: 1 g via INTRAVENOUS
  Filled 2015-07-19: qty 10

## 2015-07-19 MED ORDER — ENOXAPARIN SODIUM 40 MG/0.4ML ~~LOC~~ SOLN
40.0000 mg | SUBCUTANEOUS | Status: DC
  Administered 2015-07-19: 40 mg via SUBCUTANEOUS
  Filled 2015-07-19: qty 0.4

## 2015-07-19 MED ORDER — ASPIRIN 81 MG PO TABS: 81.0000 mg | ORAL_TABLET | Freq: Every day | ORAL | Status: DC

## 2015-07-19 MED ORDER — ALPRAZOLAM 0.5 MG PO TABS: 0.5000 mg | ORAL_TABLET | Freq: Three times a day (TID) | ORAL | Status: DC | PRN

## 2015-07-19 MED ORDER — INSULIN ASPART 100 UNIT/ML ~~LOC~~ SOLN: 0.0000 [IU] | Freq: Three times a day (TID) | SUBCUTANEOUS | Status: DC

## 2015-07-19 MED ORDER — SODIUM CHLORIDE 0.9 % IV BOLUS (SEPSIS)
500.0000 mL | Freq: Once | INTRAVENOUS | Status: AC
Start: 2015-07-19 — End: 2015-07-19
  Administered 2015-07-19: 500 mL via INTRAVENOUS

## 2015-07-19 MED ORDER — FESOTERODINE FUMARATE ER 8 MG PO TB24
8.0000 mg | ORAL_TABLET | Freq: Every day | ORAL | Status: DC
  Administered 2015-07-20: 8 mg via ORAL
  Filled 2015-07-19 (×2): qty 1

## 2015-07-19 MED ORDER — LOSARTAN POTASSIUM 50 MG PO TABS
100.0000 mg | ORAL_TABLET | Freq: Every day | ORAL | Status: DC
  Administered 2015-07-20: 100 mg via ORAL
  Filled 2015-07-19: qty 2

## 2015-07-19 MED ORDER — SERTRALINE HCL 100 MG PO TABS
100.0000 mg | ORAL_TABLET | Freq: Every day | ORAL | Status: DC
  Administered 2015-07-20: 100 mg via ORAL
  Filled 2015-07-19: qty 1

## 2015-07-19 MED ORDER — ATORVASTATIN CALCIUM 40 MG PO TABS: 40.0000 mg | ORAL_TABLET | Freq: Every day | ORAL | Status: DC

## 2015-07-19 MED ORDER — ASPIRIN EC 81 MG PO TBEC
81.0000 mg | DELAYED_RELEASE_TABLET | Freq: Every day | ORAL | Status: DC
  Administered 2015-07-20: 81 mg via ORAL
  Filled 2015-07-19: qty 1

## 2015-07-19 MED ORDER — MAGNESIUM SULFATE 2 GM/50ML IV SOLN
2.0000 g | Freq: Once | INTRAVENOUS | Status: AC
  Administered 2015-07-19: 2 g via INTRAVENOUS
  Filled 2015-07-19: qty 50

## 2015-07-19 MED ORDER — SODIUM CHLORIDE 0.9 % IV BOLUS (SEPSIS)
500.0000 mL | Freq: Once | INTRAVENOUS | Status: AC
  Administered 2015-07-19: 500 mL via INTRAVENOUS

## 2015-07-19 NOTE — ED Notes (Signed)
Dr. Yelverton MD at bedside. 

## 2015-07-19 NOTE — ED Notes (Signed)
Pt arrives from home via [facility].  EMS reports home health aide witnessed syncopal episode.  EMS reports pt sudden generally weak, aide lower pt to floor, one episode vomiting reported.  EMS reports no SOB, no CP, no neuro deficits.  Pt denies dizziness, lightheadedness, SOB.  Pt states, "I just felt weak and couldn't stand up".  Pt AO x 4, NAD noted at this time.

## 2015-07-19 NOTE — ED Notes (Signed)
Patient family aware, Patient meal tray ordered. Admitting NP at bedside.

## 2015-07-19 NOTE — ED Provider Notes (Signed)
CSN: [id number]     Arrival date & time 07/19/15  1114 History   First MD Initiated Contact with Patient 07/19/15 1115     Chief Complaint  Patient presents with  . Loss of Consciousness     (Consider location/radiation/quality/duration/timing/severity/associated sxs/prior Treatment) HPI Patient presents from home by EMS. He had a witnessed syncopal episode by home health aide. Patient states he became very weak and was lowered to the floor by the aid. Was unresponsive for unknown period of time. When patient awoke he vomited once. EMS arrived the patient was oriented to person. Has history of mild dementia. Patient states he is currently feeling much better. Denies any nausea. Denies chest pain or abdominal pain at any point. No headache or visual changes. Past Medical History  Diagnosis Date  . Hyperlipidemia   . Hypertension   . Dementia   . Anxiety associated with depression   . Urolithiasis   . History of cystoscopy     uretheral stricutre, 2005- Dr.Humphreys  . FTT (failure to thrive)     admited 08/2008  . Osteoarthritis     hands  . Colon cancer [facility])     s/p resection 12/06  . Prostate cancer ([place])     s/p XRT  . Diabetes mellitus     diet mgt (11`/12)  . Loss of hearing     being fitted for hearing aids (11/12)  . SBO (small bowel obstruction) ([place]) 03/08/2014   Past Surgical History  Procedure Laterality Date  . Hemicolectomy  12/06  . Transurethral resection of prostate    . Xrt      ERT for prostate ca  . Cataract extraction, bilateral     Family History  Problem Relation Age of Onset  . Diabetes Daughter   . Cancer Neg Hx   . Early death Neg Hx   . Heart disease Neg Hx   . Hyperlipidemia Neg Hx   . Hypertension Neg Hx   . Kidney disease Neg Hx   . Stroke Neg Hx    Social History  Substance Use Topics  . Smoking status: Former Smoker    Quit date: 03/20/1967  . Smokeless tobacco: Never Used  . Alcohol Use: No     Comment: quit '68    Review  of Systems  Constitutional: Negative for fever and chills.  Respiratory: Negative for cough and shortness of breath.   Cardiovascular: Negative for chest pain.  Gastrointestinal: Positive for nausea and vomiting. Negative for abdominal pain and diarrhea.  Musculoskeletal: Negative for back pain and neck pain.  Skin: Negative for rash.  Neurological: Positive for syncope and weakness (generalized). Negative for dizziness, seizures, light-headedness and headaches.  All other systems reviewed and are negative.     Allergies  Review of patient's allergies indicates no known allergies.  Home Medications   Prior to Admission medications   Medication Sig Start Date End Date Taking? Authorizing Provider  ALPRAZolam Duanne Moron) 0.5 MG tablet Take 1 tablet (0.5 mg total) by mouth 3 (three) times daily as needed. for anxiety 01/27/15  Yes Janith Lima, MD  aspirin 81 MG tablet Take 81 mg by mouth daily.     Yes Historical Provider, MD  B Complex Vitamins (VITAMIN-B COMPLEX PO) Take 1 tablet by mouth daily.    Yes Historical Provider, MD  fesoterodine (TOVIAZ) 8 MG TB24 tablet Take 1 tablet (8 mg total) by mouth daily. 05/13/14  Yes Janith Lima, MD  hydrochlorothiazide (HYDRODIURIL) 25 MG tablet TAKE  1 TABLET BY MOUTH DAILY 07/18/15  Yes Janith Lima, MD  losartan (COZAAR) 100 MG tablet TAKE 1 TABLET BY MOUTH DAILY 06/01/15  Yes Janith Lima, MD  ONE TOUCH ULTRA TEST test strip USE TO CHECK BLOOD SUGARS UP TO THREE TIMES DAILY 02/08/15  Yes Janith Lima, MD  [facility] DELICA LANCETS 99991111 MISC USE TO TEST BLOOD SUGAR UP TO THREE TIMES DAILY 05/26/15  Yes Janith Lima, MD  polyethylene glycol powder (GLYCOLAX/MIRALAX) powder Take 17 g by mouth 2 (two) times daily as needed. 04/14/15  Yes Janith Lima, MD  sertraline (ZOLOFT) 100 MG tablet TAKE 1 TABLET BY MOUTH DAILY 07/08/15  Yes Janith Lima, MD  simvastatin (ZOCOR) 80 MG tablet Take 0.5 tablets (40 mg total) by mouth every evening. 05/13/14   Yes Janith Lima, MD  HYDROcodone-acetaminophen (NORCO/VICODIN) 5-325 MG tablet Take 1 tablet by mouth every 6 (six) hours as needed for moderate pain. Patient not taking: Reported on 07/19/2015 01/25/15   Janith Lima, MD  Linaclotide [facility]) 145 MCG CAPS capsule Take 1 capsule (145 mcg total) by mouth daily. Patient not taking: Reported on 07/19/2015 05/30/15   Janith Lima, MD  promethazine-dextromethorphan (PROMETHAZINE-DM) 6.25-15 MG/5ML syrup Take 5 mLs by mouth 4 (four) times daily as needed for cough. Patient not taking: Reported on 07/19/2015 04/14/15   Janith Lima, MD  simvastatin (ZOCOR) 80 MG tablet TAKE 1/2 TABLET BY MOUTH EVERY EVENING Patient not taking: Reported on 07/19/2015 04/22/15   Janith Lima, MD   BP 151/58 mmHg  Pulse 59  Temp(Src) 97.8 F (36.6 C) (Oral)  Resp 21  Ht 5\' 6"  (1.676 m)  Wt 160 lb (72.576 kg)  BMI 25.84 kg/m2  SpO2 100% Physical Exam  Constitutional: He appears well-developed and well-nourished. No distress.  HENT:  Head: Normocephalic and atraumatic.  Mouth/Throat: Oropharynx is clear and moist.  Eyes: EOM are normal. Pupils are equal, round, and reactive to light.  Neck: Normal range of motion. Neck supple.  No posterior midline cervical tenderness to palpation.  Cardiovascular: Regular rhythm.   Bradycardia  Pulmonary/Chest: Effort normal and breath sounds normal. No respiratory distress. He has no wheezes. He has no rales. He exhibits no tenderness.  Abdominal: Soft. Bowel sounds are normal. He exhibits no distension and no mass. There is no tenderness. There is no rebound and no guarding.  Small reducible ventral hernia  Musculoskeletal: Normal range of motion. He exhibits no edema or tenderness.  No lower extremity asymmetry, swelling or tenderness. Distal pulses equal and intact.  Neurological: He is alert.  5/5 motor in all extremity. Sensation is point type. Patient is oriented 2.  Skin: Skin is warm and dry. No rash noted.  No erythema.  Psychiatric: He has a normal mood and affect. His behavior is normal.  Nursing note and vitals reviewed.   ED Course  Procedures (including critical care time) Labs Review Labs Reviewed  URINALYSIS, ROUTINE W REFLEX MICROSCOPIC (NOT AT [facility]) - Abnormal; Notable for the following:    APPearance HAZY (*)    Leukocytes, UA MODERATE (*)    All other components within normal limits  CBC WITH DIFFERENTIAL/PLATELET - Abnormal; Notable for the following:    Hemoglobin 12.8 (*)    HCT 38.5 (*)    Platelets 141 (*)    All other components within normal limits  COMPREHENSIVE METABOLIC PANEL - Abnormal; Notable for the following:    Glucose, Bld 116 (*)  BUN 28 (*)    Creatinine, Ser 1.52 (*)    Total Protein 5.9 (*)    Albumin 3.3 (*)    ALT 12 (*)    GFR calc non Af Amer 39 (*)    GFR calc Af Amer 45 (*)    All other components within normal limits  URINE MICROSCOPIC-ADD ON - Abnormal; Notable for the following:    Squamous Epithelial / LPF 0-5 (*)    Bacteria, UA MANY (*)    Casts HYALINE CASTS (*)    All other components within normal limits  MAGNESIUM  I-STAT TROPOININ, ED    Imaging Review No results found. I have personally reviewed and evaluated these images and lab results as part of my medical decision-making.   EKG Interpretation   Date/Time:  Tuesday July 19 2015 11:32:38 EDT Ventricular Rate:  80 PR Interval:  254 QRS Duration: 96 QT Interval:  422 QTC Calculation: 487 R Axis:   56 Text Interpretation:  Sinus rhythm Ventricular bigeminy Prolonged PR  interval Left atrial enlargement Left ventricular hypertrophy Nonspecific  T abnormalities, lateral leads Baseline wander in lead(s) V4 V5 V6  Confirmed by Yaire Kreher  MD, Donyae Kilner ([id number]) on 07/19/2015 3:09:54 PM      MDM   Final diagnoses:  Syncope and collapse  UTI (lower urinary tract infection)   Patient became unsteady with standing even after IV fluids. Noted to have a UTI. Heart rate is  sinus rhythm with bigeminy. Remains a symptomatically when lying down. Discussed with hospitalist and will admit to observation bed.     Julianne Rice, MD 07/19/15 [phone]

## 2015-07-19 NOTE — ED Notes (Signed)
Dinner tray ordered.

## 2015-07-19 NOTE — H&P (Signed)
[facility] History and Physical                                                                                    Russell [name], is a 82 y.o. male  MRN: [id number]   DOB - [birth year]  Admit Date - 07/19/2015  Outpatient Primary MD for the patient is Scarlette Calico, MD  Referring MD: Lita Mains / ER  PMH: Past Medical History  Diagnosis Date  . Hyperlipidemia   . Hypertension   . Dementia   . Anxiety associated with depression   . Urolithiasis   . History of cystoscopy     uretheral stricutre, 2005- Dr.Humphreys  . FTT (failure to thrive)     admited 08/2008  . Osteoarthritis     hands  . Colon cancer [facility])     s/p resection 12/06  . Prostate cancer ([place])     s/p XRT  . Diabetes mellitus     diet mgt (11`/12)  . Loss of hearing     being fitted for hearing aids (11/12)  . SBO (small bowel obstruction) ([place]) 03/08/2014      PSH: Past Surgical History  Procedure Laterality Date  . Hemicolectomy  12/06  . Transurethral resection of prostate    . Xrt      ERT for prostate ca  . Cataract extraction, bilateral       CC:  Chief Complaint  Patient presents with  . Loss of Consciousness     HPI: 82 year old male patient history of diet-controlled diabetes, prior colon cancer status post right hemicolectomy and history of small bowel obstruction requiring hospitalization (2015), hypertension, prior TURP and issues with urinary incontinence and overactive bladder on medical therapy, dementia with depression, prior bradycardia and known PVCs and dyslipidemia. Patient presents to the hospital with witnessed syncopal event. He was with his home health aide when he became weak and the aide lowered him to the floor. One episode of vomiting. No definitive loss of consciousness. EMS arrived to the scene and patient was not short of breath, he was not experiencing chest pain and had no focal neurological deficits. He was complaining of dizziness and lightheadedness and  shortness of breath. He told them "I just felt weak and couldn't stand up". He has not started any new medications, he has not had any recent cough, fevers or chills, and he has been having increased urinary frequency, he notices that his CBGs are very little in down in nature.  ER Evaluation and treatment: Afebrile-BP 156/57, pulse 53, respirations 15; was not orthostatic when checked-not hypoxic EKG: Sinus rhythm with PVC bigeminy, ventricular rate 80 bpm with PVCs likely not being 100% perfused, QTC 487 ms, P-R greater than 20, no definitive ischemic changes and essentially none changed from previous EKGs Lab data: Na 142, K 4.0, BUN 28 and creatinine 1.52, glucose 116, troponin 0.01, WBCs 4300 with normal differential, hemoglobin 12.8, platelets slightly low at 141,000; urinalysis abnormal and concerning for UTI with hazy appearance many bacteria highly cast moderate leukocytes and 6-30 WBCs Normal saline bolus 500 mL 2 Rocephin 1 g IV 1  Review of Systems   In addition to the  HPI above,  No Fever-chills, myalgias or other constitutional symptoms No Headache, changes with Vision or hearing, new weakness, tingling, numbness in any extremity, has been weak generalized with dizziness upon standing No problems swallowing food or Liquids, indigestion/reflux No Chest pain, Cough or Shortness of Breath, palpitations, orthopnea or DOE No Abdominal pain, N/V; no melena or hematochezia, no dark tarry stools No dysuria, hematuria or flank pain has been experiencing urinary frequency for the past several days-this is not associated with severe hyperglycemia No new skin rashes, lesions, masses or bruises, No new joints pains-aches No recent weight gain or loss No polyuria, polydypsia or polyphagia,  *A full 10 point Review of Systems was done, except as stated above, all other Review of Systems were negative.  Social History Social History  Substance Use Topics  . Smoking status: Former Smoker     Quit date: 03/20/1967  . Smokeless tobacco: Never Used  . Alcohol Use: No     Comment: quit '68    Resides at: Private residence  Lives with: Alone but has 24-hour caregiver present  Ambulatory status: With a cane   Family History Family History  Problem Relation Age of Onset  . Diabetes Daughter   . Cancer Neg Hx   . Early death Neg Hx   . Heart disease Neg Hx   . Hyperlipidemia Neg Hx   . Hypertension Neg Hx   . Kidney disease Neg Hx   . Stroke Neg Hx      Prior to Admission medications   Medication Sig Start Date End Date Taking? Authorizing Provider  ALPRAZolam Duanne Moron) 0.5 MG tablet Take 1 tablet (0.5 mg total) by mouth 3 (three) times daily as needed. for anxiety 01/27/15  Yes Janith Lima, MD  aspirin 81 MG tablet Take 81 mg by mouth daily.     Yes Historical Provider, MD  B Complex Vitamins (VITAMIN-B COMPLEX PO) Take 1 tablet by mouth daily.    Yes Historical Provider, MD  fesoterodine (TOVIAZ) 8 MG TB24 tablet Take 1 tablet (8 mg total) by mouth daily. 05/13/14  Yes Janith Lima, MD  hydrochlorothiazide (HYDRODIURIL) 25 MG tablet TAKE 1 TABLET BY MOUTH DAILY 07/18/15  Yes Janith Lima, MD  losartan (COZAAR) 100 MG tablet TAKE 1 TABLET BY MOUTH DAILY 06/01/15  Yes Janith Lima, MD  ONE TOUCH ULTRA TEST test strip USE TO CHECK BLOOD SUGARS UP TO THREE TIMES DAILY 02/08/15  Yes Janith Lima, MD  [facility] DELICA LANCETS [id number] MISC USE TO TEST BLOOD SUGAR UP TO THREE TIMES DAILY 05/26/15  Yes Janith Lima, MD  polyethylene glycol powder (GLYCOLAX/MIRALAX) powder Take 17 g by mouth 2 (two) times daily as needed. 04/14/15  Yes Janith Lima, MD  sertraline (ZOLOFT) 100 MG tablet TAKE 1 TABLET BY MOUTH DAILY 07/08/15  Yes Janith Lima, MD  simvastatin (ZOCOR) 80 MG tablet Take 0.5 tablets (40 mg total) by mouth every evening. 05/13/14  Yes Janith Lima, MD  HYDROcodone-acetaminophen (NORCO/VICODIN) 5-325 MG tablet Take 1 tablet by mouth every 6 (six) hours as needed  for moderate pain. Patient not taking: Reported on 07/19/2015 01/25/15   Janith Lima, MD  Linaclotide [facility]) 145 MCG CAPS capsule Take 1 capsule (145 mcg total) by mouth daily. Patient not taking: Reported on 07/19/2015 05/30/15   Janith Lima, MD  promethazine-dextromethorphan (PROMETHAZINE-DM) 6.25-15 MG/5ML syrup Take 5 mLs by mouth 4 (four) times daily as needed for cough. Patient not taking: Reported  on 07/19/2015 04/14/15   Janith Lima, MD  simvastatin (ZOCOR) 80 MG tablet TAKE 1/2 TABLET BY MOUTH EVERY EVENING Patient not taking: Reported on 07/19/2015 04/22/15   Janith Lima, MD    No Known Allergies  Physical Exam  Vitals  Blood pressure 151/58, pulse 59, temperature 97.8 F (36.6 C), temperature source Oral, resp. rate 21, height 5\' 6"  (1.676 m), weight 160 lb (72.576 kg), SpO2 100 %.   General:  In no acute distress, appears healthy and well nourished  Psych:  Normal affect, Denies Suicidal or Homicidal ideations, Awake Alert, Oriented X 3. Speech and thought patterns are clear and appropriate  Neuro:   No focal neurological deficits, CN II through XII intact, Strength 5/5 all 4 extremities, Sensation intact all 4 extremities.  ENT:  Ears and Eyes appear Normal, Conjunctivae clear, PER. Moist oral mucosa without erythema or exudates.  Neck:  Supple, No lymphadenopathy appreciated  Respiratory:  Symmetrical chest wall movement, Good air movement bilaterally, CTAB. Room Air  Cardiac:  RRR, No Murmurs, no LE edema noted, no JVD, No carotid bruits, peripheral pulses palpable at 2+  Abdomen:  Positive bowel sounds, Soft, Non tender, Non distended,  No masses appreciated, no obvious hepatosplenomegaly  Skin:  No Cyanosis, Normal Skin Turgor, No Skin Rash or Bruise.  Extremities: Symmetrical without obvious trauma or injury,  no effusions.  Data Review  CBC  Recent Labs Lab 07/19/15 1212  WBC 4.3  HGB 12.8*  HCT 38.5*  PLT 141*  MCV 88.1  MCH 29.3  MCHC  33.2  RDW 15.2  LYMPHSABS 0.7  MONOABS 0.3  EOSABS 0.2  BASOSABS 0.0    Chemistries   Recent Labs Lab 07/19/15 1212  NA 142  K 4.0  CL 106  CO2 26  GLUCOSE 116*  BUN 28*  CREATININE 1.52*  CALCIUM 9.0  AST 21  ALT 12*  ALKPHOS 65  BILITOT 0.6    estimated creatinine clearance is 29.7 mL/min (by C-G formula based on Cr of 1.52).  No results for input(s): TSH, T4TOTAL, T3FREE, THYROIDAB in the last 72 hours.  Invalid input(s): FREET3  Coagulation profile No results for input(s): INR, PROTIME in the last 168 hours.  No results for input(s): DDIMER in the last 72 hours.  Cardiac Enzymes No results for input(s): CKMB, TROPONINI, MYOGLOBIN in the last 168 hours.  Invalid input(s): CK  Invalid input(s): POCBNP  Urinalysis    Component Value Date/Time   COLORURINE YELLOW 07/19/2015 1233   APPEARANCEUR HAZY* 07/19/2015 1233   LABSPEC 1.014 07/19/2015 1233   PHURINE 6.5 07/19/2015 1233   GLUCOSEU NEGATIVE 07/19/2015 1233   GLUCOSEU NEGATIVE 03/22/2014 1454   HGBUR NEGATIVE 07/19/2015 1233   HGBUR large 08/03/2008 1258   BILIRUBINUR NEGATIVE 07/19/2015 1233   BILIRUBINUR neg 04/09/2014 1056   KETONESUR NEGATIVE 07/19/2015 1233   PROTEINUR NEGATIVE 07/19/2015 1233   PROTEINUR neg 04/09/2014 1056   UROBILINOGEN 4.0 04/09/2014 1056   UROBILINOGEN 0.2 03/22/2014 1454   NITRITE NEGATIVE 07/19/2015 1233   NITRITE neg 04/09/2014 1056   LEUKOCYTESUR MODERATE* 07/19/2015 1233    Imaging results:   No results found.   EKG: (Independently reviewed)  Sinus rhythm with PVC bigeminy, ventricular rate 80 bpm with PVCs likely not being 100% perfused, QTC 487 ms, P-R greater than 20, no definitive ischemic changes and essentially none changed from previous EKGs   Assessment & Plan  Principal Problem:   Syncope and collapse -Seems multifactorial related to volume depletion in  an elderly patient on diuretics; possibly related to suspected UTI -Admit to medical  floor/Obs -Gentle IV fluid hydration -Echocardiogram and monitor for arrhythmias -No focal neurological deficits and no indication this point pursue CT of the head or MRI  Active Problems:   PVC's /Bradycardia -History of an past -Potentially could've contributed to syncopal event if having more PVCs that may not be perfused as opposed to normal beats -Potassium normal -Check magnesium    AKI  -Baseline creatinine 1.15-1.26 -BUN has been increasing noting in 2015 BUN was 16 and current BUN 37 and this may be reflective of patient's diuretic use -Patient has no lower extremity edema so consider discontinuation of diuretics versus utilizing only for weight gain -Follow-up on echocardiogram    Essential hypertension, benign -Currently with mild systolic hypertension readings in the 150s with normal diastolic readings -Continue preadmission Cozaar    URINARY INCONTINENCE -Continue Toviaz    Acute combined systolic and diastolic heart failure - EF 42% on echo 12/2013- medical therapy -Repeat echo this admission as above -Continue ARB -See above regarding possibility of discontinuing diuretic therapy need to follow-up on echocardiogram first    Acute UTI -Urinalysis abnormal concerning for UTI especially in presence of mild cytopenia -Empiric Rocephin -Follow-up on urine culture    Hyperlipidemia with target LDL less than 100 -Continue Zocor    Mild dementia -Has caregivers at home -PT/OT evaluation    Diabetes mellitus type 2, diet-controlled  -Follow CBGs and provide SSI -Check HgbA1c    DVT Prophylaxis: Lovenox  Family Communication:   Multiple family members at bedside  Code Status:  Full code  Condition:  Stable  Discharge disposition: Anticipate discharge back to previous home environment once medically stable pending PT/OT evaluation  Time spent in minutes : 60      ELLIS,ALLISON L. ANP on 07/19/2015 at 3:31 PM  You may contact me by going to  [email or web address] - password [email or web address]  I am available from 7a-7p but please confirm I am on the schedule by going to Amion as above.   After 7p please contact night coverage person covering me after hours  [facility]

## 2015-07-19 NOTE — ED Notes (Signed)
Called lab advised they would add on Mag to  Lab already sent.

## 2015-07-19 NOTE — ED Notes (Signed)
Condom cath applied

## 2015-07-19 NOTE — Progress Notes (Signed)
Blood sugar low 59. OJ given. BS rechecked [place], Mervin Kung RN

## 2015-07-20 DIAGNOSIS — R55 Syncope and collapse: Secondary | ICD-10-CM

## 2015-07-20 LAB — COMPREHENSIVE METABOLIC PANEL
ALK PHOS: 60 U/L (ref 38–126)
ALT: 10 U/L — ABNORMAL LOW (ref 17–63)
ANION GAP: 10 (ref 5–15)
AST: 18 U/L (ref 15–41)
Albumin: 2.8 g/dL — ABNORMAL LOW (ref 3.5–5.0)
BILIRUBIN TOTAL: 0.5 mg/dL (ref 0.3–1.2)
BUN: 23 mg/dL — ABNORMAL HIGH (ref 6–20)
CALCIUM: 8.4 mg/dL — AB (ref 8.9–10.3)
CO2: 23 mmol/L (ref 22–32)
Chloride: 108 mmol/L (ref 101–111)
Creatinine, Ser: 1.2 mg/dL (ref 0.61–1.24)
GFR calc non Af Amer: 52 mL/min — ABNORMAL LOW (ref >60)
GFR, EST AFRICAN AMERICAN: 60 mL/min — AB (ref >60)
Glucose, Bld: 120 mg/dL — ABNORMAL HIGH (ref 65–99)
Potassium: 4 mmol/L (ref 3.5–5.1)
Sodium: 141 mmol/L (ref 135–145)
TOTAL PROTEIN: 5.1 g/dL — AB (ref 6.5–8.1)

## 2015-07-20 LAB — GLUCOSE, CAPILLARY
GLUCOSE-CAPILLARY: 80 mg/dL (ref 65–99)
Glucose-Capillary: 149 mg/dL — ABNORMAL HIGH (ref 65–99)

## 2015-07-20 LAB — CBC
HCT: 35.8 % — ABNORMAL LOW (ref 39.0–52.0)
HEMOGLOBIN: 11.9 g/dL — AB (ref 13.0–17.0)
MCH: 29 pg (ref 26.0–34.0)
MCHC: 33.2 g/dL (ref 30.0–36.0)
MCV: 87.3 fL (ref 78.0–100.0)
PLATELETS: 167 10*3/uL (ref 150–400)
RBC: 4.1 MIL/uL — AB (ref 4.22–5.81)
RDW: 15.2 % (ref 11.5–15.5)
WBC: 4.6 10*3/uL (ref 4.0–10.5)

## 2015-07-20 LAB — HEMOGLOBIN A1C
Hgb A1c MFr Bld: 5.6 % (ref 4.8–5.6)
Mean Plasma Glucose: 114 mg/dL

## 2015-07-20 MED ORDER — CEFPODOXIME PROXETIL 200 MG PO TABS: 200.0000 mg | ORAL_TABLET | Freq: Two times a day (BID) | ORAL | Status: DC

## 2015-07-20 NOTE — Discharge Instructions (Signed)
Follow with Primary MD Thomas Jones, MD in 7 days  ° °Get CBC, CMP, 2 view Chest X ray checked  by Primary MD next visit.  ° ° °Activity: As tolerated with Full fall precautions use walker/cane & assistance as needed ° ° °Disposition Home  ° ° °Diet: Heart Healthy  ° °For Heart failure patients - Check your Weight same time everyday, if you gain over 2 pounds, or you develop in leg swelling, experience more shortness of breath or chest pain, call your Primary MD immediately. Follow Cardiac Low Salt Diet and 1.5 lit/day fluid restriction. ° ° °On your next visit with your primary care physician please Get Medicines reviewed and adjusted. ° ° °Please request your Prim.MD to go over all Hospital Tests and Procedure/Radiological results at the follow up, please get all Hospital records sent to your Prim MD by signing hospital release before you go home. ° ° °If you experience worsening of your admission symptoms, develop shortness of breath, life threatening emergency, suicidal or homicidal thoughts you must seek medical attention immediately by calling 911 or calling your MD immediately  if symptoms less severe. ° °You Must read complete instructions/literature along with all the possible adverse reactions/side effects for all the Medicines you take and that have been prescribed to you. Take any new Medicines after you have completely understood and accpet all the possible adverse reactions/side effects.  ° °Do not drive, operating heavy machinery, perform activities at heights, swimming or participation in water activities or provide baby sitting services if your were admitted for syncope or siezures until you have seen by Primary MD or a Neurologist and advised to do so again. ° °Do not drive when taking Pain medications.  ° ° °Do not take more than prescribed Pain, Sleep and Anxiety Medications ° °Special Instructions: If you have smoked or chewed Tobacco  in the last 2 yrs please stop smoking, stop any regular  Alcohol  and or any Recreational drug use. ° °Wear Seat belts while driving. ° ° °Please note ° °You were cared for by a hospitalist during your hospital stay. If you have any questions about your discharge medications or the care you received while you were in the hospital after you are discharged, you can call the unit and asked to speak with the hospitalist on call if the hospitalist that took care of you is not available. Once you are discharged, your primary care physician will handle any further medical issues. Please note that NO REFILLS for any discharge medications will be authorized once you are discharged, as it is imperative that you return to your primary care physician (or establish a relationship with a primary care physician if you do not have one) for your aftercare needs so that they can reassess your need for medications and monitor your lab values. ° °

## 2015-07-20 NOTE — Care Management Note (Signed)
Case Management Note Marvetta Gibbons RN, BSN Unit 2W-Case Manager [phone]  Patient Details  Name: Russell [name] MRN: [id number] Date of Birth: 1924-04-22  Subjective/Objective:   Pt admitted with syncope                 Action/Plan: PTA pt lived at home has daughter nearby- Russell [name]- spoke with pt and daughter Russell [name] at bedside- regarding [place]- has used [facility] in past would like to use them again for [facility] services- orders for HH-RN/PT/aide- per daughter they would love to have same therapist as last time if available- will let [place] know- referral called to Manuela Schwartz with [facility] for [facility] needs- per daughter pt has all needed DME at home that includes cane, RW, BSC, tub bench.  No further CM needs noted- daughter's husband to come provide transportation.   Expected Discharge Date:   07/20/15               Expected Discharge Plan:  [place]  In-House Referral:     Discharge planning Services  CM Consult  Post Acute Care Choice:  Home Health Choice offered to:  Adult Children  DME Arranged:    DME Agency:     HH Arranged:  RN, PT, Nurse's Aide Standard Agency:  [facility]  Status of Service:  Completed, signed off  Medicare Important Message Given:    Date Medicare IM Given:    Medicare IM give by:    Date Additional Medicare IM Given:    Additional Medicare Important Message give by:     If discussed at [place] of Stay Meetings, dates discussed:    Discharge Disposition:  Home/home health   Additional Comments:  Dawayne Patricia, RN 07/20/2015, 2:04 PM

## 2015-07-20 NOTE — Evaluation (Signed)
Physical Therapy Evaluation Patient Details Name: Russell [name] MRN: [id number] DOB: [birth year] Today's Date: 07/20/2015   History of Present Illness  Patient is a 82 y/o male with hx of HTN, HLD, dementia, DM, colon ca presents with syncope likely from hypovolemia and possible UTI.   Clinical Impression  Patient presents with hx of dementia, impaired balance and difficulty utilizing RW impacting safe mobility. Tolerated gait training with RW requiring Min A at times due to difficulty with turns. Recommend supervision for mobility at home. Pt not a great historian so not able to provide info about level of support at home. Reports having 24/7 A from caregivers. Will follow acutely to maximize independence and mobility prior to return home.    Follow Up Recommendations No PT follow up;Supervision for mobility/OOB    Equipment Recommendations  None recommended by PT    Recommendations for Other Services       Precautions / Restrictions Precautions Precautions: Fall Restrictions Weight Bearing Restrictions: No      Mobility  Bed Mobility               General bed mobility comments: Sitting in chair upon PT arrival.   Transfers Overall transfer level: Needs assistance Equipment used: Rolling walker (2 wheeled) Transfers: Sit to/from Stand Sit to Stand: Min assist         General transfer comment: Min A to boost from chair with cues for hand placement/technique.   Ambulation/Gait Ambulation/Gait assistance: Min guard;Min assist Ambulation Distance (Feet): 250 Feet Assistive device: Rolling walker (2 wheeled) Gait Pattern/deviations: Step-through pattern;Decreased stride length   Gait velocity interpretation: at or above normal speed for age/gender General Gait Details: Pt with good gait speed but some difficulty with navigating RW esp during turns requiring Min A at times.   Stairs            Wheelchair Mobility    Modified Rankin (Stroke Patients Only)        Balance Overall balance assessment: Needs assistance Sitting-balance support: Feet supported;No upper extremity supported Sitting balance-Leahy Scale: Good     Standing balance support: During functional activity Standing balance-Leahy Scale: Fair Standing balance comment: Able to stand statically unsupported but requires UE support for ambulation.                             Pertinent Vitals/Pain Pain Assessment: No/denies pain    Home Living Family/patient expects to be discharged to:: Private residence Living Arrangements: Children Available Help at Discharge: Personal care attendant;Available 24 hours/day;Family Type of Home: House Home Access: Stairs to enter Entrance Stairs-Rails: Right Entrance Stairs-Number of Steps: 3-4 Home Layout: One level Home Equipment: Cane - single point;Walker - 2 wheels Additional Comments: Pt has [place] aide that comes in 3 days/week to assist with ADLs/IADLs.    Prior Function Level of Independence: Needs assistance   Gait / Transfers Assistance Needed: Ambulates with SPC.     Comments: Pt not a great historian and no family members present in room during PT evaluation. Information above taken from previous admission.     Hand Dominance        Extremity/Trunk Assessment   Upper Extremity Assessment: Defer to OT evaluation           Lower Extremity Assessment: Generalized weakness         Communication   Communication: HOH  Cognition Arousal/Alertness: Awake/alert Behavior During Therapy: WFL for tasks assessed/performed Overall Cognitive Status: History of cognitive  impairments - at baseline                      General Comments      Exercises        Assessment/Plan    PT Assessment Patient needs continued PT services  PT Diagnosis Difficulty walking   PT Problem List Decreased strength;Decreased cognition;Decreased balance;Decreased safety awareness;Decreased knowledge of use of DME   PT Treatment Interventions Therapeutic exercise;Gait training;Functional mobility training;Therapeutic activities;Patient/family education;Balance training;DME instruction   PT Goals (Current goals can be found in the Care Plan section) Acute Rehab PT Goals Patient Stated Goal: to return home and eat something PT Goal Formulation: With patient Time For Goal Achievement: 08/03/15 Potential to Achieve Goals: Good    Frequency Min 3X/week   Barriers to discharge   Not sure of level of support at home? Reports usually someone is with him    Co-evaluation               End of Session Equipment Utilized During Treatment: Gait belt Activity Tolerance: Patient tolerated treatment well Patient left: in chair;with call bell/phone within reach Nurse Communication: Mobility status    Functional Assessment Tool Used: Clinical judgment Functional Limitation: Mobility: Walking and moving around Mobility: Walking and Moving Around Current Status [id number]): At least 20 percent but less than 40 percent impaired, limited or restricted Mobility: Walking and Moving Around Goal Status [phone]): At least 1 percent but less than 20 percent impaired, limited or restricted    Time: 0935-0950 PT Time Calculation (min) (ACUTE ONLY): 15 min   Charges:   PT Evaluation $PT Eval Low Complexity: 1 Procedure     PT G Codes:   PT G-Codes **NOT FOR INPATIENT CLASS** Functional Assessment Tool Used: Clinical judgment Functional Limitation: Mobility: Walking and moving around Mobility: Walking and Moving Around Current Status [id number]): At least 20 percent but less than 40 percent impaired, limited or restricted Mobility: Walking and Moving Around Goal Status [phone]): At least 1 percent but less than 20 percent impaired, limited or restricted    Byrl Latin A Haliey Romberg 07/20/2015, 11:28 AM Wray Kearns, PT, DPT [phone]

## 2015-07-20 NOTE — Progress Notes (Signed)
Patient ambulated 100 feet with front wheel walker with minimal assistance. BP 140/48. HR 69-75. Oxygen 97-100%. Patient states no dizziness, pain, or visual disturbances. Will continue to monitor.  Domingo Dimes RN

## 2015-07-20 NOTE — Progress Notes (Signed)
Patient to D/C home with daughter. D/C paperwork discussed with patient and daughter. Fall precautions discussed with patient. Patient wheeled to front to D/C with daughter.  Domingo Dimes RN

## 2015-07-20 NOTE — Discharge Summary (Signed)
Russell [name], is a 82 y.o. male  DOB Jul 14, 1923  MRN [id number].  Admission date:  07/19/2015  Admitting Physician  Waldemar Dickens, MD  Discharge Date:  07/20/2015   Primary MD  Scarlette Calico, MD  Recommendations for primary care physician for things to follow:   Monitor blood pressure, orthostatics, check CBC BMP in 3-5 days. Outpatient cardiology follow-up with his primary cardiologist within a week.   Admission Diagnosis  Syncope and collapse [R55] UTI (lower urinary tract infection) [N39.0]   Discharge Diagnosis  Syncope and collapse [R55] UTI (lower urinary tract infection) [N39.0]     Principal Problem:   Syncope and collapse Active Problems:   Hyperlipidemia with target LDL less than 100   Essential hypertension, benign   PVC's (premature ventricular contractions)   URINARY INCONTINENCE   Bradycardia   Acute combined systolic and diastolic heart failure - EF 42% on echo 12/2013- medical therapy   Mild dementia   Acute UTI   AKI (acute kidney injury) ([place])   Diabetes mellitus type 2, diet-controlled ([place])   Syncope      Past Medical History  Diagnosis Date  . Hyperlipidemia   . Hypertension   . Dementia   . Anxiety associated with depression   . Urolithiasis   . History of cystoscopy     uretheral stricutre, 2005- Dr.Humphreys  . FTT (failure to thrive)     admited 08/2008  . Osteoarthritis     hands  . Colon cancer [facility])     s/p resection 12/06  . Prostate cancer ([place])     s/p XRT  . Diabetes mellitus     diet mgt (11`/12)  . Loss of hearing     being fitted for hearing aids (11/12)  . SBO (small bowel obstruction) ([place]) 03/08/2014    Past Surgical History  Procedure Laterality Date  . Hemicolectomy  12/06  . Transurethral resection of prostate    . Xrt      ERT for prostate  ca  . Cataract extraction, bilateral         HPI  from the history and physical done on the day of admission:    Russell [name] is a 82 y.o. male with a Past Medical History of HLD, HTN, dementia, urolithiasis, FTT, colon cancer, DM, SBO who presents with SYncope likely from hypovolemia and possible UTI. No evidence of Szr, or metabolic etiologies. Tele admission. Gentle hydration. Suspect observation and Dc in am.      Hospital Course:     1.Syncope and collapse. Due to UTI along with dehydration and acute renal failure in the setting of HCTZ use. No chest pain, EKG nonacute, troponin negative in ER, no history of palpitation. Was treated with IV Rocephin, IV fluids for hydration and HCTZ was held, this morning back to baseline, ambulating in the hallway without any discomfort, bigeminy on telemetry without any significant dysrhythmia. Electrolytes are stable. Will be discharged on oral antibiotic Vantin for 5 more days, have discontinued HCTZ upon discharge, home PT  ordered, will follow with his primary care physician in 3-5 days along with primary cardiologist Dr. Jens Som in the next week or so.  2. History of bradycardia and chronic combined diastolic and systolic heart failure. Last EF 40%, no decompensation, HCTZ discontinued due to #1 above, request PCP to monitor weight and address any future diuretic use clinically, he is on ARB but no beta blocker due to bradycardia, follows with [place] cardiologist Dr. Jens Som and will continue to do so.  3. Dehydration with acute renal failure. Resolved with IV fluids, creatinine back to baseline, HCTZ discontinued.  4. Essential hypertension. Stable on ARB continue.  5. Dyslipidemia. Continue Zocor at home dose.  6. Questionable type 2 diabetes mellitus. On diet-controlled continue. Follow-up with PCP.   Discharge Condition: Fair  Follow UP  Follow-up Information    Follow up with Scarlette Calico, MD. Schedule an appointment as soon as  possible for a visit in 1 week.   Specialty:  Internal Medicine   Contact information:   [address] [address] [phone]       Follow up with Virl Axe, MD. Schedule an appointment as soon as possible for a visit in 1 week.   Specialty:  Cardiology   Contact information:   [id number] [place] [address] [phone]        Consults obtained - None  Diet and Activity recommendation: See Discharge Instructions below  Discharge Instructions       Discharge Instructions    Diet - low sodium heart healthy    Complete by:  As directed      Discharge instructions    Complete by:  As directed   Follow with Primary MD Scarlette Calico, MD in 7 days   Get CBC, CMP, 2 view Chest X ray checked  by Primary MD next visit.    Activity: As tolerated with Full fall precautions use walker/cane & assistance as needed   Disposition Home   Diet:   Heart Healthy   For Heart failure patients - Check your Weight same time everyday, if you gain over 2 pounds, or you develop in leg swelling, experience more shortness of breath or chest pain, call your Primary MD immediately. Follow Cardiac Low Salt Diet and 1.5 lit/day fluid restriction.   On your next visit with your primary care physician please Get Medicines reviewed and adjusted.   Please request your Prim.MD to go over all Hospital Tests and Procedure/Radiological results at the follow up, please get all Hospital records sent to your Prim MD by signing hospital release before you go home.   If you experience worsening of your admission symptoms, develop shortness of breath, life threatening emergency, suicidal or homicidal thoughts you must seek medical attention immediately by calling 911 or calling your MD immediately  if symptoms less severe.  You Must read complete instructions/literature along with all the possible adverse reactions/side effects for all the Medicines you take and  that have been prescribed to you. Take any new Medicines after you have completely understood and accpet all the possible adverse reactions/side effects.   Do not drive, operating heavy machinery, perform activities at heights, swimming or participation in water activities or provide baby sitting services if your were admitted for syncope or siezures until you have seen by Primary MD or a Neurologist and advised to do so again.  Do not drive when taking Pain medications.    Do not take more than prescribed  Pain, Sleep and Anxiety Medications  Special Instructions: If you have smoked or chewed Tobacco  in the last 2 yrs please stop smoking, stop any regular Alcohol  and or any Recreational drug use.  Wear Seat belts while driving.   Please note  You were cared for by a hospitalist during your hospital stay. If you have any questions about your discharge medications or the care you received while you were in the hospital after you are discharged, you can call the unit and asked to speak with the hospitalist on call if the hospitalist that took care of you is not available. Once you are discharged, your primary care physician will handle any further medical issues. Please note that NO REFILLS for any discharge medications will be authorized once you are discharged, as it is imperative that you return to your primary care physician (or establish a relationship with a primary care physician if you do not have one) for your aftercare needs so that they can reassess your need for medications and monitor your lab values.     Increase activity slowly    Complete by:  As directed              Discharge Medications       Medication List    STOP taking these medications        hydrochlorothiazide 25 MG tablet  Commonly known as:  HYDRODIURIL      TAKE these medications        ALPRAZolam 0.5 MG tablet  Commonly known as:  XANAX  Take 1 tablet (0.5 mg total) by mouth 3 (three) times daily  as needed. for anxiety     aspirin 81 MG tablet  Take 81 mg by mouth daily.     cefpodoxime 200 MG tablet  Commonly known as:  VANTIN  Take 1 tablet (200 mg total) by mouth 2 (two) times daily.     fesoterodine 8 MG Tb24 tablet  Commonly known as:  TOVIAZ  Take 1 tablet (8 mg total) by mouth daily.     HYDROcodone-acetaminophen 5-325 MG tablet  Commonly known as:  NORCO/VICODIN  Take 1 tablet by mouth every 6 (six) hours as needed for moderate pain.     Linaclotide 145 MCG Caps capsule  Commonly known as:  LINZESS  Take 1 capsule (145 mcg total) by mouth daily.     losartan 100 MG tablet  Commonly known as:  COZAAR  TAKE 1 TABLET BY MOUTH DAILY     ONE TOUCH ULTRA TEST test strip  Generic drug:  glucose blood  USE TO CHECK BLOOD SUGARS UP TO THREE TIMES DAILY     ONETOUCH DELICA LANCETS 99991111 Misc  USE TO TEST BLOOD SUGAR UP TO THREE TIMES DAILY     polyethylene glycol powder powder  Commonly known as:  GLYCOLAX/MIRALAX  Take 17 g by mouth 2 (two) times daily as needed.     promethazine-dextromethorphan 6.25-15 MG/5ML syrup  Commonly known as:  PROMETHAZINE-DM  Take 5 mLs by mouth 4 (four) times daily as needed for cough.     sertraline 100 MG tablet  Commonly known as:  ZOLOFT  TAKE 1 TABLET BY MOUTH DAILY     simvastatin 80 MG tablet  Commonly known as:  ZOCOR  Take 0.5 tablets (40 mg total) by mouth every evening.     simvastatin 80 MG tablet  Commonly known as:  ZOCOR  TAKE 1/2 TABLET BY MOUTH EVERY EVENING  VITAMIN-B COMPLEX PO  Take 1 tablet by mouth daily.        Major procedures and Radiology Reports - PLEASE review detailed and final reports for all details, in brief -       No results found.  Micro Results      No results found for this or any previous visit (from the past 240 hour(s)).     Today   Subjective    Latrell Bondy today has no headache,no chest abdominal pain,no new weakness tingling or numbness, feels much better  wants to go home today.     Objective   Blood pressure 140/48, pulse 64, temperature 97.6 F (36.4 C), temperature source Oral, resp. rate 18, height 5\' 5"  (1.651 m), weight 70.654 kg (155 lb 12.2 oz), SpO2 97 %.   Intake/Output Summary (Last 24 hours) at 07/20/15 1013 Last data filed at 07/20/15 0436  Gross per 24 hour  Intake 360.83 ml  Output   1500 ml  Net -1139.17 ml    Exam Awake Alert, No new F.N deficits, Normal affect [place].AT,PERRAL Supple Neck,No JVD, No cervical lymphadenopathy appriciated.  Symmetrical Chest wall movement, Good air movement bilaterally, CTAB RRR,No Gallops,Rubs or new Murmurs, No Parasternal Heave +ve B.Sounds, Abd Soft, Non tender, No organomegaly appriciated, No rebound -guarding or rigidity. No Cyanosis, Clubbing or edema, No new Rash or bruise   Data Review   CBC w Diff: Lab Results  Component Value Date   WBC 4.6 07/20/2015   WBC 4.3 09/20/2006   HGB 11.9* 07/20/2015   HGB 14.3 09/20/2006   HCT 35.8* 07/20/2015   HCT 41.4 09/20/2006   PLT 167 07/20/2015   PLT 219 09/20/2006   LYMPHOPCT 16 07/19/2015   LYMPHOPCT 16.5 09/20/2006   MONOPCT 7 07/19/2015   MONOPCT 16.6* 09/20/2006   EOSPCT 4 07/19/2015   EOSPCT 9.7* 09/20/2006   BASOPCT 0 07/19/2015   BASOPCT 0.4 09/20/2006    CMP: Lab Results  Component Value Date   NA 141 07/20/2015   K 4.0 07/20/2015   CL 108 07/20/2015   CO2 23 07/20/2015   BUN 23* 07/20/2015   CREATININE 1.20 07/20/2015   PROT 5.1* 07/20/2015   ALBUMIN 2.8* 07/20/2015   BILITOT 0.5 07/20/2015   ALKPHOS 60 07/20/2015   AST 18 07/20/2015   ALT 10* 07/20/2015  .   Total Time in preparing paper work, data evaluation and todays exam - 35 minutes  Thurnell Lose M.D on 07/20/2015 at 10:13 AM  [facility]   Office  [phone]

## 2015-07-21 ENCOUNTER — Telehealth: Payer: Self-pay | Admitting: *Deleted

## 2015-07-21 ENCOUNTER — Telehealth: Payer: Self-pay | Admitting: Internal Medicine

## 2015-07-21 LAB — URINE CULTURE: Culture: >=100000

## 2015-07-21 NOTE — Telephone Encounter (Signed)
Transition Care Management Follow-up Telephone Call   Date discharged? 07/20/15   How have you been since you were released from the hospital? Daughter states dad is doing all right she had call this morning asking if its ok to give him Gatorade since he was dehydrated when he went to hosp. Gave daughter MD response on msg   Do you understand why you were in the hospital? YES   Do you understand the discharge instructions? YES   Where were you discharged to? Home   Items Reviewed:  Medications reviewed: YES  Allergies reviewed: YES  Dietary changes reviewed: NO  Referrals reviewed: No referral needed   Functional Questionnaire:   Activities of Daily Living (ADLs):   She states heare independent in the following: ambulation, bathing and hygiene, feeding, continence, grooming, toileting and dressing States he require assistance with the following: ambulation and bathing and hygiene   Any transportation issues/concerns?: NO   Any patient concerns? NO   Confirmed importance and date/time of follow-up visits scheduled YES, appt 07/27/15  Provider Appointment booked with Dr. Ronnald Ramp  Confirmed with patient if condition begins to worsen call PCP or go to the ER.  Patient was given the office number and encouraged to call back with question or concerns.  : YES

## 2015-07-21 NOTE — Telephone Encounter (Signed)
Pt was just discharged from the hospital from dehydration and UTI. She is hoping you can give her a call. Someone had told her to give him Gatorade for his dehydration and she wants to be sure he can have it. Please call Russell [name] at [phone]

## 2015-07-21 NOTE — Telephone Encounter (Signed)
Pt's daughter informed of pcp response below.  (sarah self, [employer] student  documented this record)

## 2015-07-21 NOTE — Telephone Encounter (Signed)
Called daughter to do TCM call gave her MD response concerning msg...Russell [name]

## 2015-07-21 NOTE — Telephone Encounter (Signed)
Yes, Gatorade is an excellent choice

## 2015-07-21 NOTE — Telephone Encounter (Signed)
Called pt/daughter concerning TCM call. Gave her MD response below...Russell [name]

## 2015-07-25 ENCOUNTER — Telehealth: Payer: Self-pay | Admitting: Internal Medicine

## 2015-07-25 NOTE — Telephone Encounter (Signed)
Pt was referred from hospital, nursing and physical therapy. Family refused nurse but not physical therapy. [employer] went out for physical therapy and states pt is doing very well and since there is a $25 copay with each visit family has decided to refuse pt also. Pt is scheduled to come in this week to see Dr. Ronnald Ramp

## 2015-07-26 NOTE — Telephone Encounter (Signed)
Routing to PCP as fyi

## 2015-07-27 ENCOUNTER — Other Ambulatory Visit (INDEPENDENT_AMBULATORY_CARE_PROVIDER_SITE_OTHER): Payer: PPO

## 2015-07-27 ENCOUNTER — Encounter: Payer: Self-pay | Admitting: Internal Medicine

## 2015-07-27 ENCOUNTER — Ambulatory Visit (INDEPENDENT_AMBULATORY_CARE_PROVIDER_SITE_OTHER): Payer: PPO | Admitting: Internal Medicine

## 2015-07-27 VITALS — BP 122/60 | HR 78 | Temp 97.8°F | Resp 16 | Ht 65.0 in | Wt 165.0 lb

## 2015-07-27 DIAGNOSIS — N390 Urinary tract infection, site not specified: Secondary | ICD-10-CM | POA: Diagnosis not present

## 2015-07-27 DIAGNOSIS — M159 Polyosteoarthritis, unspecified: Secondary | ICD-10-CM

## 2015-07-27 DIAGNOSIS — I10 Essential (primary) hypertension: Secondary | ICD-10-CM

## 2015-07-27 DIAGNOSIS — M150 Primary generalized (osteo)arthritis: Secondary | ICD-10-CM | POA: Diagnosis not present

## 2015-07-27 LAB — URINALYSIS, ROUTINE W REFLEX MICROSCOPIC
Bilirubin Urine: NEGATIVE
HGB URINE DIPSTICK: NEGATIVE
Ketones, ur: NEGATIVE
Leukocytes, UA: NEGATIVE
NITRITE: NEGATIVE
SPECIFIC GRAVITY, URINE: 1.015 (ref 1.000–1.030)
Total Protein, Urine: NEGATIVE
URINE GLUCOSE: NEGATIVE
Urobilinogen, UA: 0.2 (ref 0.0–1.0)
pH: 5.5 (ref 5.0–8.0)

## 2015-07-27 MED ORDER — HYDROCODONE-ACETAMINOPHEN 5-325 MG PO TABS: 1.0000 | ORAL_TABLET | Freq: Four times a day (QID) | ORAL | Status: DC | PRN

## 2015-07-27 NOTE — Patient Instructions (Signed)

## 2015-07-27 NOTE — Progress Notes (Signed)
Pre visit review using our clinic review tool, if applicable. No additional management support is needed unless otherwise documented below in the visit note. 

## 2015-07-27 NOTE — Progress Notes (Signed)
Subjective:  Patient ID: Russell [name], male    DOB: 12-25-1923  Age: 82 y.o. MRN: [id number]  CC: Urinary Tract Infection and Osteoarthritis   HPI Russell [name] presents for follow-up after a recent admission for a urinary tract infection. He complains of arthritis pain but otherwise feels well. He denies dysuria, hematuria, abdominal pain, nausea, vomiting, fever, chills. He is tolerating the antibiotic regimen well with no abdominal pain, diarrhea, or rash.  Admission Diagnosis Syncope and collapse [R55] UTI (lower urinary tract infection) [N39.0]   Discharge Diagnosis Syncope and collapse [R55] UTI (lower urinary tract infection) [N39.0]   Principal Problem:  Syncope and collapse Active Problems:  Hyperlipidemia with target LDL less than 100  Essential hypertension, benign  PVC's (premature ventricular contractions)  URINARY INCONTINENCE  Bradycardia  Acute combined systolic and diastolic heart failure - EF 42% on echo 12/2013- medical therapy  Mild dementia  Acute UTI  AKI (acute kidney injury) ([place])  Diabetes mellitus type 2, diet-controlled ([place])  Syncope  Outpatient Prescriptions Prior to Visit  Medication Sig Dispense Refill  . ALPRAZolam (XANAX) 0.5 MG tablet Take 1 tablet (0.5 mg total) by mouth 3 (three) times daily as needed. for anxiety 60 tablet 3  . aspirin 81 MG tablet Take 81 mg by mouth daily.      . B Complex Vitamins (VITAMIN-B COMPLEX PO) Take 1 tablet by mouth daily.     . cefpodoxime (VANTIN) 200 MG tablet Take 1 tablet (200 mg total) by mouth 2 (two) times daily. 10 tablet 0  . fesoterodine (TOVIAZ) 8 MG TB24 tablet Take 1 tablet (8 mg total) by mouth daily. 30 tablet 11  . losartan (COZAAR) 100 MG tablet TAKE 1 TABLET BY MOUTH DAILY 90 tablet 1  . ONE TOUCH ULTRA TEST test strip USE TO CHECK BLOOD SUGARS UP TO THREE TIMES DAILY 100 each 11  . ONETOUCH DELICA LANCETS 99991111 MISC USE TO TEST BLOOD SUGAR UP TO THREE TIMES DAILY 200 each 4  .  sertraline (ZOLOFT) 100 MG tablet TAKE 1 TABLET BY MOUTH DAILY 90 tablet 2  . polyethylene glycol powder (GLYCOLAX/MIRALAX) powder Take 17 g by mouth 2 (two) times daily as needed. (Patient not taking: Reported on 07/29/2015) 3350 g 1  . simvastatin (ZOCOR) 80 MG tablet Take 0.5 tablets (40 mg total) by mouth every evening. 90 tablet 3  . HYDROcodone-acetaminophen (NORCO/VICODIN) 5-325 MG tablet Take 1 tablet by mouth every 6 (six) hours as needed for moderate pain. (Patient not taking: Reported on 07/19/2015) 65 tablet 0  . Linaclotide (LINZESS) 145 MCG CAPS capsule Take 1 capsule (145 mcg total) by mouth daily. (Patient not taking: Reported on 07/19/2015) 90 capsule 3  . promethazine-dextromethorphan (PROMETHAZINE-DM) 6.25-15 MG/5ML syrup Take 5 mLs by mouth 4 (four) times daily as needed for cough. (Patient not taking: Reported on 07/19/2015) 118 mL 0  . simvastatin (ZOCOR) 80 MG tablet TAKE 1/2 TABLET BY MOUTH EVERY EVENING (Patient not taking: Reported on 07/19/2015) 90 tablet 3   No facility-administered medications prior to visit.    ROS Review of Systems  Constitutional: Negative.  Negative for fever, chills, diaphoresis, appetite change and fatigue.  HENT: Negative.   Eyes: Negative.   Respiratory: Negative.  Negative for cough, choking, chest tightness, shortness of breath and stridor.   Cardiovascular: Negative.  Negative for chest pain, palpitations and leg swelling.  Gastrointestinal: Negative.  Negative for nausea, vomiting, abdominal pain and diarrhea.  Endocrine: Negative.   Genitourinary: Negative.  Negative for dysuria,  hematuria, flank pain and difficulty urinating.  Musculoskeletal: Positive for back pain.  Skin: Negative.  Negative for color change and rash.  Allergic/Immunologic: Negative.   Neurological: Negative.  Negative for dizziness.  Hematological: Negative.  Negative for adenopathy. Does not bruise/bleed easily.  Psychiatric/Behavioral: Negative.     Objective:    BP 122/60 mmHg  Pulse 78  Temp(Src) 97.8 F (36.6 C) (Oral)  Resp 16  Ht 5\' 5"  (1.651 m)  Wt 165 lb (74.844 kg)  BMI 27.46 kg/m2  SpO2 98%  BP Readings from Last 3 Encounters:  07/29/15 120/60  07/27/15 122/60  07/20/15 124/54    Wt Readings from Last 3 Encounters:  07/29/15 165 lb 1.9 oz (74.898 kg)  07/27/15 165 lb (74.844 kg)  07/20/15 155 lb 12.2 oz (70.654 kg)    Physical Exam  Constitutional: He is oriented to person, place, and time.  Non-toxic appearance. He does not have a sickly appearance. He does not appear ill. No distress.  HENT:  Mouth/Throat: Oropharynx is clear and moist. No oropharyngeal exudate.  Eyes: Conjunctivae are normal. Right eye exhibits no discharge. Left eye exhibits no discharge. No scleral icterus.  Neck: Normal range of motion. Neck supple. No JVD present. No tracheal deviation present. No thyromegaly present.  Cardiovascular: Normal rate, regular rhythm, normal heart sounds and intact distal pulses.  Exam reveals no gallop and no friction rub.   No murmur heard. Pulmonary/Chest: Effort normal and breath sounds normal. No stridor. No respiratory distress. He has no wheezes. He has no rales. He exhibits no tenderness.  Abdominal: Soft. Bowel sounds are normal. He exhibits no distension and no mass. There is no tenderness. There is no rebound and no guarding.  Musculoskeletal: Normal range of motion. He exhibits no edema or tenderness.  Lymphadenopathy:    He has no cervical adenopathy.  Neurological: He is oriented to person, place, and time.  Skin: Skin is warm and dry. No rash noted. He is not diaphoretic. No erythema. No pallor.  Vitals reviewed.   Lab Results  Component Value Date   WBC 4.6 07/20/2015   HGB 11.9* 07/20/2015   HCT 35.8* 07/20/2015   PLT 167 07/20/2015   GLUCOSE 120* 07/20/2015   CHOL 124 01/25/2015   TRIG 74.0 01/25/2015   HDL 46.50 01/25/2015   LDLCALC 63 01/25/2015   ALT 10* 07/20/2015   AST 18 07/20/2015   NA  141 07/20/2015   K 4.0 07/20/2015   CL 108 07/20/2015   CREATININE 1.20 07/20/2015   BUN 23* 07/20/2015   CO2 23 07/20/2015   TSH 1.86 01/25/2015   PSA 1.94 02/20/2013   HGBA1C 5.6 07/19/2015   MICROALBUR 2.2* 01/25/2015   2d ago     Colony Count 30,000 COLONIES/MLP   Preliminary Report Gram Negative RodsP         No results found.  Assessment & Plan:   Dyllin was seen today for urinary tract infection and osteoarthritis.  Diagnoses and all orders for this visit:  Acute UTI- his urine culture is still positive but for only 30,000 colonies of a gram-negative rod, he will continue the course of cefpodoxime -     Urinalysis, Routine w reflex microscopic (not at [facility]); Future -     CULTURE, URINE COMPREHENSIVE; Future  Essential hypertension, benign- his blood pressure is well-controlled, electrolytes and renal function are stable. -     Urinalysis, Routine w reflex microscopic (not at [facility]); Future  Primary osteoarthritis involving multiple joints- he will continue  Norco as needed for pain. -     HYDROcodone-acetaminophen (NORCO/VICODIN) 5-325 MG tablet; Take 1 tablet by mouth every 6 (six) hours as needed for moderate pain.  I have discontinued Mr. Slavinski simvastatin, promethazine-dextromethorphan, simvastatin, and Linaclotide. I am also having him maintain his aspirin, B Complex Vitamins (VITAMIN-B COMPLEX PO), fesoterodine, ALPRAZolam, ONE TOUCH ULTRA TEST, ONETOUCH DELICA LANCETS 99991111, losartan, sertraline, cefpodoxime, hydrochlorothiazide, and HYDROcodone-acetaminophen.  Meds ordered this encounter  Medications  . hydrochlorothiazide (HYDRODIURIL) 25 MG tablet    Sig: Take 25 mg by mouth daily.   Marland Kitchen HYDROcodone-acetaminophen (NORCO/VICODIN) 5-325 MG tablet    Sig: Take 1 tablet by mouth every 6 (six) hours as needed for moderate pain.    Dispense:  65 tablet    Refill:  0     Follow-up: Return if symptoms worsen or fail to improve.  Scarlette Calico, MD

## 2015-07-29 ENCOUNTER — Ambulatory Visit (INDEPENDENT_AMBULATORY_CARE_PROVIDER_SITE_OTHER): Payer: PPO | Admitting: Cardiology

## 2015-07-29 ENCOUNTER — Encounter: Payer: Self-pay | Admitting: Cardiology

## 2015-07-29 VITALS — BP 120/60 | HR 68 | Ht 65.0 in | Wt 165.1 lb

## 2015-07-29 DIAGNOSIS — R55 Syncope and collapse: Secondary | ICD-10-CM | POA: Diagnosis not present

## 2015-07-29 DIAGNOSIS — I493 Ventricular premature depolarization: Secondary | ICD-10-CM

## 2015-07-29 NOTE — Patient Instructions (Signed)
Schedule appointment for a 48 hour holter monitor  Your physician recommends that you schedule a follow-up appointment in: 1 month with Dr.Klein

## 2015-07-29 NOTE — Progress Notes (Signed)
Cardiology Office Note   Date:  07/30/2015   ID:  Russell [name], DOB 1923-07-20, MRN [id number]  PCP:  Scarlette Calico, MD  Cardiologist:  Dr. Caryl Comes    Chief Complaint  Patient presents with  . Hospitalization Follow-up      History of Present Illness: Russell [name] is a 82 y.o. male who presents for post hospitalization for syncope.  D/c'd 07/20/15.    He had an echocardiogram in September 2013 which demonstrated normal systolic function with an EF of 50-55% at that time. He has no documented history of CAD. His history is also significant for hypertension, hyperlipidemia and diabetes. In 2015 he had chest pain and he only wanted medical therapy.    Recent episode of syncope at discharge this was dx. To be due to dehydration and UTI.  The daughter is concerned that more may have been going on.  On d/c dx bradycardial is also listed.  EKGs with  PVCs big. He has had these for years.  I reviewed telemetry strips as well.  No brady.    Today pt has no complaints.  Denies chest pain or SOB.  His PCP saw on the 29th and is following UTI.  No further syncope.    Past Medical History  Diagnosis Date  . Hyperlipidemia   . Hypertension   . Dementia   . Anxiety associated with depression   . Urolithiasis   . History of cystoscopy     uretheral stricutre, 2005- Dr.Humphreys  . FTT (failure to thrive)     admited 08/2008  . Osteoarthritis     hands  . Colon cancer [facility])     s/p resection 12/06  . Prostate cancer ([place])     s/p XRT  . Diabetes mellitus     diet mgt (11`/12)  . Loss of hearing     being fitted for hearing aids (11/12)  . SBO (small bowel obstruction) ([place]) 03/08/2014    Past Surgical History  Procedure Laterality Date  . Hemicolectomy  12/06  . Transurethral resection of prostate    . Xrt      ERT for prostate ca  . Cataract extraction, bilateral       Current Outpatient Prescriptions  Medication Sig Dispense Refill  . ALPRAZolam (XANAX) 0.5 MG tablet Take 1  tablet (0.5 mg total) by mouth 3 (three) times daily as needed. for anxiety 60 tablet 3  . aspirin 81 MG tablet Take 81 mg by mouth daily.      . B Complex Vitamins (VITAMIN-B COMPLEX PO) Take 1 tablet by mouth daily.     . cefpodoxime (VANTIN) 200 MG tablet Take 1 tablet (200 mg total) by mouth 2 (two) times daily. 10 tablet 0  . fesoterodine (TOVIAZ) 8 MG TB24 tablet Take 1 tablet (8 mg total) by mouth daily. 30 tablet 11  . hydrochlorothiazide (HYDRODIURIL) 25 MG tablet Take 25 mg by mouth daily.     Marland Kitchen HYDROcodone-acetaminophen (NORCO/VICODIN) 5-325 MG tablet Take 1 tablet by mouth every 6 (six) hours as needed for moderate pain. 65 tablet 0  . losartan (COZAAR) 100 MG tablet TAKE 1 TABLET BY MOUTH DAILY 90 tablet 1  . ONE TOUCH ULTRA TEST test strip USE TO CHECK BLOOD SUGARS UP TO THREE TIMES DAILY 100 each 11  . ONETOUCH DELICA LANCETS 99991111 MISC USE TO TEST BLOOD SUGAR UP TO THREE TIMES DAILY 200 each 4  . polyethylene glycol (MIRALAX / GLYCOLAX) packet Take 17 g by mouth daily.    Marland Kitchen  sertraline (ZOLOFT) 100 MG tablet TAKE 1 TABLET BY MOUTH DAILY 90 tablet 2  . simvastatin (ZOCOR) 80 MG tablet Take 40 mg by mouth daily at 6 PM.   3   No current facility-administered medications for this visit.    Allergies:   Review of patient's allergies indicates no known allergies.    Social History:  The patient  reports that he quit smoking about 48 years ago. He has never used smokeless tobacco. He reports that he does not drink alcohol or use illicit drugs.   Family History:  The patient's family history includes Diabetes in his daughter. There is no history of Cancer, Early death, Heart disease, Hyperlipidemia, Hypertension, Kidney disease, Stroke, or Heart attack.    ROS:  General:no colds or fevers, no weight changes Skin:no rashes or ulcers HEENT:no blurred vision, no congestion CV:see HPI PUL:see HPI GI:no diarrhea constipation or melena, no indigestion GU:no hematuria, no dysuria MS:no  joint pain, no claudication Neuro:no syncope, no lightheadedness Endo:+ diabetes stable, no thyroid disease  Wt Readings from Last 3 Encounters:  07/29/15 165 lb 1.9 oz (74.898 kg)  07/27/15 165 lb (74.844 kg)  07/20/15 155 lb 12.2 oz (70.654 kg)     PHYSICAL EXAM: VS:  BP 120/60 mmHg  Pulse 68  Ht 5\' 5"  (1.651 m)  Wt 165 lb 1.9 oz (74.898 kg)  BMI 27.48 kg/m2 , BMI Body mass index is 27.48 kg/(m^2). General:Pleasant affect, NAD Skin:Warm and dry, brisk capillary refill HEENT:normocephalic, sclera clear, mucus membranes moist Neck:supple, no JVD, no bruits  Heart:S1S2 RRR without murmur, gallup, rub or click Lungs:clear without rales, rhonchi, or wheezes [id number], non tender, + BS, do not palpate liver spleen or masses Ext:no lower ext edema, 2+ pedal pulses, 2+ radial pulses Neuro:alert and oriented, MAE, follows commands, + facial symmetry    EKG:  EKG is NOT ordered today.    Recent Labs: 01/25/2015: TSH 1.86 07/19/2015: Magnesium 1.8 07/20/2015: ALT 10*; BUN 23*; Creatinine, Ser 1.20; Hemoglobin 11.9*; Platelets 167; Potassium 4.0; Sodium 141    Lipid Panel    Component Value Date/Time   CHOL 124 01/25/2015 1357   TRIG 74.0 01/25/2015 1357   HDL 46.50 01/25/2015 1357   CHOLHDL 3 01/25/2015 1357   VLDL 14.8 01/25/2015 1357   LDLCALC 63 01/25/2015 1357       Other studies Reviewed: Additional studies/ records that were reviewed today include: hospital notes Echo 12/2013. Study Conclusions  - Left ventricle: The cavity size was mildly dilated. Wall thickness was normal. The estimated ejection fraction was 42%. More prounounced inferoseptal hypokinesis. Doppler parameters are consistent with abnormal left ventricular relaxation (grade 1 diastolic dysfunction). Abnormal GLPSS at -12%, with more prounounced inferoseptal strain abnormality. The E/e&' ratio is >15, suggesting elevated LV filling pressure. - Aortic valve: Trileaflet. Sclerosis without  stenosis. There was moderate regurgitation. - Aorta: Aortic root dimension: 41 mm (ED). - Aortic root: The aortic root is mildly dilated. - Mitral valve: Redundant subvalvular structures - mild to moderate regurgitation. - Left atrium: The atrium was at the upper limits of normal in size. - Right atrium: The atrium was mildly dilated.  Impressions:  - Compared to the prior echo in 2013, the LV is now more dilated, LVEF is lower at 42% with inferoseptal and inferior wall motion and strain abnormalities. There is moderate AI, mild to moderate MR, diastolic dysfunction and elevated LV filling pressure.  ASSESSMENT AND PLAN:  1.  Syncope with hx of big. PVCs but also in d/c  summary bradycardia was listed.  Will check 48 hour monitor.  Pt's family cannot manage an event monitor per them.  Will start with this.  He will follow up with Dr. Caryl Comes.  Syncope thought to be due to dehydration and UTI.  2. PVCs hx of them.  Has been stable        Current medicines are reviewed with the patient today.  The patient Has no concerns regarding medicines.  The following changes have been made:  See above Labs/ tests ordered today include:see above  Disposition:   FU:  see above  Lennie Muckle, NP  07/30/2015 4:27 PM    [facility] [place], [place], [place], [place] Phone: [phone]; Fax: [phone]

## 2015-07-30 ENCOUNTER — Encounter: Payer: Self-pay | Admitting: Cardiology

## 2015-07-30 LAB — CULTURE, URINE COMPREHENSIVE: Colony Count: 30000

## 2015-08-03 ENCOUNTER — Encounter: Payer: Self-pay | Admitting: *Deleted

## 2015-08-03 NOTE — Progress Notes (Signed)
Patient ID: Russell [name], male   DOB: Oct 14, 1923, 83 y.o.   MRN: [id number] Patient did not show up for 08/03/15, 2:30 PM, appointment to have a 48 hour holter monitor applied.

## 2015-08-23 ENCOUNTER — Ambulatory Visit (INDEPENDENT_AMBULATORY_CARE_PROVIDER_SITE_OTHER): Payer: PPO | Admitting: Podiatry

## 2015-08-23 ENCOUNTER — Encounter: Payer: Self-pay | Admitting: Podiatry

## 2015-08-23 DIAGNOSIS — M79674 Pain in right toe(s): Secondary | ICD-10-CM | POA: Diagnosis not present

## 2015-08-23 DIAGNOSIS — M79675 Pain in left toe(s): Secondary | ICD-10-CM | POA: Diagnosis not present

## 2015-08-23 DIAGNOSIS — B351 Tinea unguium: Secondary | ICD-10-CM

## 2015-08-23 NOTE — Patient Instructions (Signed)
Diabetes and Foot Care Diabetes may cause you to have problems because of poor blood supply (circulation) to your feet and legs. This may cause the skin on your feet to become thinner, break easier, and heal more slowly. Your skin may become dry, and the skin may peel and crack. You may also have nerve damage in your legs and feet causing decreased feeling in them. You may not notice minor injuries to your feet that could lead to infections or more serious problems. Taking care of your feet is one of the most important things you can do for yourself.  HOME CARE INSTRUCTIONS  Wear shoes at all times, even in the house. Do not go barefoot. Bare feet are easily injured.  Check your feet daily for blisters, cuts, and redness. If you cannot see the bottom of your feet, use a mirror or ask someone for help.  Wash your feet with warm water (do not use hot water) and mild soap. Then pat your feet and the areas between your toes until they are completely dry. Do not soak your feet as this can dry your skin.  Apply a moisturizing lotion or petroleum jelly (that does not contain alcohol and is unscented) to the skin on your feet and to dry, brittle toenails. Do not apply lotion between your toes.  Trim your toenails straight across. Do not dig under them or around the cuticle. File the edges of your nails with an emery board or nail file.  Do not cut corns or calluses or try to remove them with medicine.  Wear clean socks or stockings every day. Make sure they are not too tight. Do not wear knee-high stockings since they may decrease blood flow to your legs.  Wear shoes that fit properly and have enough cushioning. To break in new shoes, wear them for just a few hours a day. This prevents you from injuring your feet. Always look in your shoes before you put them on to be sure there are no objects inside.  Do not cross your legs. This may decrease the blood flow to your feet.  If you find a minor scrape,  cut, or break in the skin on your feet, keep it and the skin around it clean and dry. These areas may be cleansed with mild soap and water. Do not cleanse the area with peroxide, alcohol, or iodine.  When you remove an adhesive bandage, be sure not to damage the skin around it.  If you have a wound, look at it several times a day to make sure it is healing.  Do not use heating pads or hot water bottles. They may burn your skin. If you have lost feeling in your feet or legs, you may not know it is happening until it is too late.  Make sure your health care provider performs a complete foot exam at least annually or more often if you have foot problems. Report any cuts, sores, or bruises to your health care provider immediately. SEEK MEDICAL CARE IF:   You have an injury that is not healing.  You have cuts or breaks in the skin.  You have an ingrown nail.  You notice redness on your legs or feet.  You feel burning or tingling in your legs or feet.  You have pain or cramps in your legs and feet.  Your legs or feet are numb.  Your feet always feel cold. SEEK IMMEDIATE MEDICAL CARE IF:   There is increasing redness,   swelling, or pain in or around a wound.  There is a red line that goes up your leg.  Pus is coming from a wound.  You develop a fever or as directed by your health care provider.  You notice a bad smell coming from an ulcer or wound.   This information is not intended to replace advice given to you by your health care provider. Make sure you discuss any questions you have with your health care provider.   Document Released: 04/13/2000 Document Revised: 12/17/2012 Document Reviewed: 09/23/2012 Elsevier Interactive Patient Education 2016 [employer] Inc[employer]  

## 2015-08-23 NOTE — Progress Notes (Signed)
Patient ID: Russell [name], male   DOB: 24-Feb-1924, 82 y.o.   MRN: [id number] Subjective: This patient presents today complaining of painful toenails walking wearing shoes and is requesting nail debridement. The patient's daughter is present in the treatment room today  Objective: Patient appears orientated 3 DP and PT pulses 2/4 bilaterally No open skin lesions noted bilaterally Mild distal keratoses hallux bilaterally The toenails are elongated, brittle, deformed, hypertrophic and tender to direct palpation 6-10  Assessment: Symptomatic onychomycoses 6-10 Type II diabetic diet-controlled  Plan: Debridement toenails 10 mechanical and electrically without any bleeding  Reappoint 3 months

## 2015-09-07 ENCOUNTER — Ambulatory Visit (INDEPENDENT_AMBULATORY_CARE_PROVIDER_SITE_OTHER): Payer: PPO | Admitting: Internal Medicine

## 2015-09-07 VITALS — BP 98/52 | HR 65 | Ht 65.0 in | Wt 159.2 lb

## 2015-09-07 DIAGNOSIS — R55 Syncope and collapse: Secondary | ICD-10-CM

## 2015-09-07 DIAGNOSIS — I493 Ventricular premature depolarization: Secondary | ICD-10-CM

## 2015-09-07 DIAGNOSIS — I952 Hypotension due to drugs: Secondary | ICD-10-CM | POA: Diagnosis not present

## 2015-09-07 MED ORDER — LOSARTAN POTASSIUM 50 MG PO TABS: 50.0000 mg | ORAL_TABLET | Freq: Every day | ORAL | Status: DC

## 2015-09-07 NOTE — Progress Notes (Signed)
Patient Care Team: Janith Lima, MD as PCP - General (Internal Medicine)   HPI  Russell [name] is a 82 y.o. male Seen after a hiatus of about 3 years, previously seen for PVCs.  Recently hospitalized for for syncope attributed to UTI and dehydration.  Reviewing the story with the patient, he remembers standing up and then falling down. He has had problems with orthostatic intolerance potentially following use of the commode.  His CNA at home has been recording his blood pressure; we normally are in the 100-120 range. He has a history of treated hypertension.  Records and Results Reviewed ECHO 42% 9/15  Past Medical History  Diagnosis Date  . Hyperlipidemia   . Hypertension   . Dementia   . Anxiety associated with depression   . Urolithiasis   . History of cystoscopy     uretheral stricutre, 2005- Dr.Humphreys  . FTT (failure to thrive)     admited 08/2008  . Osteoarthritis     hands  . Colon cancer [facility])     s/p resection 12/06  . Prostate cancer ([place])     s/p XRT  . Diabetes mellitus     diet mgt (11`/12)  . Loss of hearing     being fitted for hearing aids (11/12)  . SBO (small bowel obstruction) ([place]) 03/08/2014    Past Surgical History  Procedure Laterality Date  . Hemicolectomy  12/06  . Transurethral resection of prostate    . Xrt      ERT for prostate ca  . Cataract extraction, bilateral      Current Outpatient Prescriptions  Medication Sig Dispense Refill  . ALPRAZolam (XANAX) 0.5 MG tablet Take 1 tablet (0.5 mg total) by mouth 3 (three) times daily as needed. for anxiety 60 tablet 3  . aspirin 81 MG tablet Take 81 mg by mouth daily.      . B Complex Vitamins (VITAMIN-B COMPLEX PO) Take 1 tablet by mouth daily.     . cefpodoxime (VANTIN) 200 MG tablet Take 1 tablet (200 mg total) by mouth 2 (two) times daily. 10 tablet 0  . fesoterodine (TOVIAZ) 8 MG TB24 tablet Take 1 tablet (8 mg total) by mouth daily. 30 tablet 11  . hydrochlorothiazide  (HYDRODIURIL) 25 MG tablet Take 25 mg by mouth daily.     Marland Kitchen HYDROcodone-acetaminophen (NORCO/VICODIN) 5-325 MG tablet Take 1 tablet by mouth every 6 (six) hours as needed for moderate pain. 65 tablet 0  . losartan (COZAAR) 100 MG tablet TAKE 1 TABLET BY MOUTH DAILY 90 tablet 1  . ONE TOUCH ULTRA TEST test strip USE TO CHECK BLOOD SUGARS UP TO THREE TIMES DAILY 100 each 11  . ONETOUCH DELICA LANCETS 99991111 MISC USE TO TEST BLOOD SUGAR UP TO THREE TIMES DAILY 200 each 4  . polyethylene glycol (MIRALAX / GLYCOLAX) packet Take 17 g by mouth daily.    . sertraline (ZOLOFT) 100 MG tablet TAKE 1 TABLET BY MOUTH DAILY 90 tablet 2  . simvastatin (ZOCOR) 80 MG tablet Take 40 mg by mouth daily at 6 PM.   3   No current facility-administered medications for this visit.    No Known Allergies    Review of Systems negative except from HPI and PMH  Physical Exam BP 98/52 mmHg  Pulse 65  Ht 5\' 5"  (1.651 m)  Wt 159 lb 3.2 oz (72.213 kg)  BMI 26.49 kg/m2 Well developed and well nourished in no acute distress HENT normal  E scleral and icterus clear Neck Supple JVP flat; carotids brisk and full Clear to ausculatio Regular rate and rhythm, 2/6  Murmur nogallops or rub Soft with active bowel sounds No clubbing cyanosis  Edema Alert and oriented, grossly normal motor and sensory function Skin Warm and Dry  ECG demonstrates sinus rhythm at 65 intervals 21/09/40 Voltage criteria for LVH and nonspecific ST-T changes   Assessment and  Plan  Syncope  Hypertension-treated  Hypotension-medical  Cardiomyopathy-mild not recently evaluated.   The patient had syncope ascribed to acute renal failure although labs were unimpressive. Furthermore, EMS blood pressure arrival was 140. I am not sure the mechanism of his syncope; the context of his mild cardiomyopathy of issues are somewhat concerning. Historically, however, he does have a history of orthostatic intolerance of this episode occurred in this  context.  Given his low blood pressures, we will decrease his losartan from 100--50; we will also have him use his diuretic on an as-needed basis. I advised him to follow-up with his PCP in the next couple of weeks.

## 2015-09-07 NOTE — Patient Instructions (Signed)
Medication Instructions: 1) Decrease Cozaar (losartan) to 50 mg once daily 2) Decrease HCTZ to 25 mg one tablet daily as needed  Labwork: - none  Procedures/Testing: - none  Follow-Up: - Dr. Caryl Comes will see you back on an as needed basis.  Any Additional Special Instructions Will Be Listed Below (If Applicable).     If you need a refill on your cardiac medications before your next appointment, please call your pharmacy.

## 2015-09-08 DIAGNOSIS — B9620 Unspecified Escherichia coli [E. coli] as the cause of diseases classified elsewhere: Secondary | ICD-10-CM | POA: Diagnosis not present

## 2015-09-08 DIAGNOSIS — N390 Urinary tract infection, site not specified: Secondary | ICD-10-CM | POA: Diagnosis not present

## 2015-09-08 DIAGNOSIS — Z0000 Encounter for general adult medical examination without abnormal findings: Secondary | ICD-10-CM | POA: Diagnosis not present

## 2015-09-08 DIAGNOSIS — N3941 Urge incontinence: Secondary | ICD-10-CM | POA: Diagnosis not present

## 2015-09-10 ENCOUNTER — Other Ambulatory Visit: Payer: Self-pay | Admitting: Internal Medicine

## 2015-09-12 NOTE — Telephone Encounter (Signed)
Faxed script back to [facility].../lmb 

## 2015-09-29 ENCOUNTER — Ambulatory Visit (INDEPENDENT_AMBULATORY_CARE_PROVIDER_SITE_OTHER): Payer: PPO | Admitting: Internal Medicine

## 2015-09-29 ENCOUNTER — Encounter: Payer: Self-pay | Admitting: Family Medicine

## 2015-09-29 ENCOUNTER — Ambulatory Visit (INDEPENDENT_AMBULATORY_CARE_PROVIDER_SITE_OTHER)
Admission: RE | Admit: 2015-09-29 | Discharge: 2015-09-29 | Disposition: A | Discharge status: Home / Self Care | Payer: PPO | Source: Ambulatory Visit | Attending: Internal Medicine | Admitting: Internal Medicine

## 2015-09-29 ENCOUNTER — Encounter: Payer: Self-pay | Admitting: Internal Medicine

## 2015-09-29 VITALS — BP 110/58 | HR 68 | Temp 97.6°F | Resp 16 | Ht 65.0 in | Wt 160.0 lb

## 2015-09-29 DIAGNOSIS — R059 Cough, unspecified: Secondary | ICD-10-CM | POA: Insufficient documentation

## 2015-09-29 DIAGNOSIS — R05 Cough: Secondary | ICD-10-CM

## 2015-09-29 DIAGNOSIS — I10 Essential (primary) hypertension: Secondary | ICD-10-CM

## 2015-09-29 DIAGNOSIS — J302 Other seasonal allergic rhinitis: Secondary | ICD-10-CM

## 2015-09-29 DIAGNOSIS — M159 Polyosteoarthritis, unspecified: Secondary | ICD-10-CM

## 2015-09-29 DIAGNOSIS — R0602 Shortness of breath: Secondary | ICD-10-CM | POA: Diagnosis not present

## 2015-09-29 DIAGNOSIS — M150 Primary generalized (osteo)arthritis: Secondary | ICD-10-CM

## 2015-09-29 MED ORDER — HYDROCODONE-ACETAMINOPHEN 5-325 MG PO TABS: 1.0000 | ORAL_TABLET | Freq: Four times a day (QID) | ORAL | Status: DC | PRN

## 2015-09-29 MED ORDER — AZELASTINE-FLUTICASONE 137-50 MCG/ACT NA SUSP: 1.0000 | Freq: Two times a day (BID) | NASAL | Status: DC

## 2015-09-29 MED ORDER — PROMETHAZINE-DM 6.25-15 MG/5ML PO SYRP: 5.0000 mL | ORAL_SOLUTION | Freq: Four times a day (QID) | ORAL | Status: DC | PRN

## 2015-09-29 NOTE — Progress Notes (Signed)
Pre visit review using our clinic review tool, if applicable. No additional management support is needed unless otherwise documented below in the visit note. 

## 2015-09-29 NOTE — Patient Instructions (Signed)

## 2015-09-29 NOTE — Progress Notes (Signed)
Subjective:  Patient ID: Russell [name], male    DOB: [birth year]  Age: 82 y.o. MRN: [id number]  CC: Cough; Osteoarthritis; and Allergic Rhinitis    HPI Russell [name] presents for a one-week history of nonproductive cough with runny nose and postnasal drip. He also needs a refill on his medication for knee arthritis pain.  Outpatient Prescriptions Prior to Visit  Medication Sig Dispense Refill  . ALPRAZolam (XANAX) 0.5 MG tablet TAKE 1 TABLET BY MOUTH THREE TIMES DAILY AS NEEDED FOR ANXIETY 60 tablet 3  . aspirin 81 MG tablet Take 81 mg by mouth daily.      . B Complex Vitamins (VITAMIN-B COMPLEX PO) Take 1 tablet by mouth daily.     . hydrochlorothiazide (HYDRODIURIL) 25 MG tablet Take one tablet (25 mg) by mouth once daily as needed     . losartan (COZAAR) 50 MG tablet Take 1 tablet (50 mg total) by mouth daily. 90 tablet 3  . ONE TOUCH ULTRA TEST test strip USE TO CHECK BLOOD SUGARS UP TO THREE TIMES DAILY 100 each 11  . ONETOUCH DELICA LANCETS 99991111 MISC USE TO TEST BLOOD SUGAR UP TO THREE TIMES DAILY 200 each 4  . polyethylene glycol (MIRALAX / GLYCOLAX) packet Take 17 g by mouth daily.    . sertraline (ZOLOFT) 100 MG tablet TAKE 1 TABLET BY MOUTH DAILY 90 tablet 2  . simvastatin (ZOCOR) 80 MG tablet Take 40 mg by mouth daily at 6 PM.   3  . cefpodoxime (VANTIN) 200 MG tablet Take 1 tablet (200 mg total) by mouth 2 (two) times daily. 10 tablet 0  . HYDROcodone-acetaminophen (NORCO/VICODIN) 5-325 MG tablet Take 1 tablet by mouth every 6 (six) hours as needed for moderate pain. 65 tablet 0  . fesoterodine (TOVIAZ) 8 MG TB24 tablet Take 1 tablet (8 mg total) by mouth daily. 30 tablet 11   No facility-administered medications prior to visit.    ROS Review of Systems  Constitutional: Negative.  Negative for fever, chills, diaphoresis, appetite change and fatigue.  HENT: Positive for congestion, postnasal drip and rhinorrhea. Negative for facial swelling, sinus pressure, sore throat,  trouble swallowing and voice change.   Eyes: Negative.   Respiratory: Positive for cough. Negative for choking, shortness of breath and stridor.   Cardiovascular: Negative.  Negative for chest pain, palpitations and leg swelling.  Gastrointestinal: Negative.  Negative for nausea, vomiting, abdominal pain, diarrhea, constipation and blood in stool.  Endocrine: Negative.   Genitourinary: Negative.   Musculoskeletal: Positive for arthralgias. Negative for myalgias, back pain, joint swelling and neck pain.  Skin: Negative.  Negative for color change, pallor and rash.  Allergic/Immunologic: Negative.   Neurological: Negative.  Negative for dizziness, tremors, syncope, light-headedness, numbness and headaches.  Hematological: Negative.  Negative for adenopathy. Does not bruise/bleed easily.  Psychiatric/Behavioral: Negative.     Objective:  BP 110/58 mmHg  Pulse 68  Temp(Src) 97.6 F (36.4 C) (Oral)  Resp 16  Ht 5\' 5"  (1.651 m)  Wt 160 lb (72.576 kg)  BMI 26.63 kg/m2  SpO2 98%  BP Readings from Last 3 Encounters:  09/29/15 110/58  09/07/15 98/52  07/29/15 120/60    Wt Readings from Last 3 Encounters:  09/29/15 160 lb (72.576 kg)  09/07/15 159 lb 3.2 oz (72.213 kg)  07/29/15 165 lb 1.9 oz (74.898 kg)    Physical Exam  Constitutional: He is oriented to person, place, and time. He appears well-developed and well-nourished.  Non-toxic appearance. He does not have  a sickly appearance. He does not appear ill. No distress.  HENT:  Head: Normocephalic and atraumatic.  Mouth/Throat: Oropharynx is clear and moist. No oropharyngeal exudate.  Eyes: Conjunctivae are normal. Right eye exhibits no discharge. Left eye exhibits no discharge. No scleral icterus.  Neck: Normal range of motion. Neck supple. No JVD present. No tracheal deviation present. No thyromegaly present.  Cardiovascular: Normal rate, regular rhythm, normal heart sounds and intact distal pulses.  Exam reveals no gallop and no  friction rub.   No murmur heard. Pulmonary/Chest: Effort normal and breath sounds normal. No stridor. No respiratory distress. He has no wheezes. He has no rales. He exhibits no tenderness.  Abdominal: Soft. Bowel sounds are normal. He exhibits no distension and no mass. There is no tenderness. There is no rebound and no guarding.  Musculoskeletal: Normal range of motion. He exhibits no edema or tenderness.  Lymphadenopathy:    He has no cervical adenopathy.  Neurological: He is oriented to person, place, and time.  Skin: Skin is warm and dry. No rash noted. He is not diaphoretic. No erythema. No pallor.  Vitals reviewed.   Lab Results  Component Value Date   WBC 4.6 07/20/2015   HGB 11.9* 07/20/2015   HCT 35.8* 07/20/2015   PLT 167 07/20/2015   GLUCOSE 120* 07/20/2015   CHOL 124 01/25/2015   TRIG 74.0 01/25/2015   HDL 46.50 01/25/2015   LDLCALC 63 01/25/2015   ALT 10* 07/20/2015   AST 18 07/20/2015   NA 141 07/20/2015   K 4.0 07/20/2015   CL 108 07/20/2015   CREATININE 1.20 07/20/2015   BUN 23* 07/20/2015   CO2 23 07/20/2015   TSH 1.86 01/25/2015   PSA 1.94 02/20/2013   HGBA1C 5.6 07/19/2015   MICROALBUR 2.2* 01/25/2015    No results found.  Assessment & Plan:   Russell [name] was seen today for cough, osteoarthritis and allergic rhinitis .  Diagnoses and all orders for this visit:  Essential hypertension, benign- his blood pressures well controlled  Cough- his exam and chest x-ray are normal, this is either a viral upper respiratory infection or symptoms related to allergic rhinitis, will treat symptomatically with Phenergan DM. -     DG Chest 2 View; Future -     promethazine-dextromethorphan (PROMETHAZINE-DM) 6.25-15 MG/5ML syrup; Take 5 mLs by mouth 4 (four) times daily as needed for cough.  Primary osteoarthritis involving multiple joints -     HYDROcodone-acetaminophen (NORCO/VICODIN) 5-325 MG tablet; Take 1 tablet by mouth every 6 (six) hours as needed for moderate  pain.  Other seasonal allergic rhinitis- I have asked him to start using Dymista nasal spray for symptom relief. -     Azelastine-Fluticasone (DYMISTA) 137-50 MCG/ACT SUSP; Place 1 Act into the nose 2 (two) times daily.  I have discontinued Mr. Arakaki cefpodoxime. I am also having him start on promethazine-dextromethorphan and Azelastine-Fluticasone. Additionally, I am having him maintain his aspirin, B Complex Vitamins (VITAMIN-B COMPLEX PO), fesoterodine, ONE TOUCH ULTRA TEST, ONETOUCH DELICA LANCETS 99991111, sertraline, hydrochlorothiazide, polyethylene glycol, simvastatin, losartan, ALPRAZolam, and HYDROcodone-acetaminophen.  Meds ordered this encounter  Medications  . HYDROcodone-acetaminophen (NORCO/VICODIN) 5-325 MG tablet    Sig: Take 1 tablet by mouth every 6 (six) hours as needed for moderate pain.    Dispense:  65 tablet    Refill:  0  . promethazine-dextromethorphan (PROMETHAZINE-DM) 6.25-15 MG/5ML syrup    Sig: Take 5 mLs by mouth 4 (four) times daily as needed for cough.    Dispense:  118 mL    Refill:  0  . Azelastine-Fluticasone (DYMISTA) 137-50 MCG/ACT SUSP    Sig: Place 1 Act into the nose 2 (two) times daily.    Dispense:  1 Bottle    Refill:  11     Follow-up: Return in about 3 weeks (around 10/20/2015).  Scarlette Calico, MD

## 2015-11-08 ENCOUNTER — Ambulatory Visit: Payer: Self-pay | Admitting: Neurology

## 2015-11-23 ENCOUNTER — Encounter (HOSPITAL_COMMUNITY): Payer: Self-pay

## 2015-11-23 ENCOUNTER — Telehealth: Payer: Self-pay | Admitting: Internal Medicine

## 2015-11-23 ENCOUNTER — Encounter: Payer: Self-pay | Admitting: Podiatry

## 2015-11-23 ENCOUNTER — Observation Stay (HOSPITAL_COMMUNITY): Payer: PPO

## 2015-11-23 ENCOUNTER — Observation Stay (HOSPITAL_COMMUNITY)
Admission: EM | Admit: 2015-11-23 | Discharge: 2015-11-23 | Disposition: A | Discharge status: Home Health | Payer: PPO | Attending: Emergency Medicine | Admitting: Emergency Medicine

## 2015-11-23 ENCOUNTER — Emergency Department (HOSPITAL_COMMUNITY): Payer: PPO

## 2015-11-23 ENCOUNTER — Ambulatory Visit (INDEPENDENT_AMBULATORY_CARE_PROVIDER_SITE_OTHER): Payer: PPO | Admitting: Podiatry

## 2015-11-23 DIAGNOSIS — E860 Dehydration: Secondary | ICD-10-CM | POA: Diagnosis not present

## 2015-11-23 DIAGNOSIS — F418 Other specified anxiety disorders: Secondary | ICD-10-CM | POA: Diagnosis not present

## 2015-11-23 DIAGNOSIS — I10 Essential (primary) hypertension: Secondary | ICD-10-CM | POA: Diagnosis not present

## 2015-11-23 DIAGNOSIS — F0390 Unspecified dementia without behavioral disturbance: Secondary | ICD-10-CM | POA: Insufficient documentation

## 2015-11-23 DIAGNOSIS — J302 Other seasonal allergic rhinitis: Secondary | ICD-10-CM | POA: Diagnosis not present

## 2015-11-23 DIAGNOSIS — Z79899 Other long term (current) drug therapy: Secondary | ICD-10-CM | POA: Insufficient documentation

## 2015-11-23 DIAGNOSIS — Z85038 Personal history of other malignant neoplasm of large intestine: Secondary | ICD-10-CM | POA: Insufficient documentation

## 2015-11-23 DIAGNOSIS — R531 Weakness: Secondary | ICD-10-CM | POA: Diagnosis not present

## 2015-11-23 DIAGNOSIS — E119 Type 2 diabetes mellitus without complications: Secondary | ICD-10-CM | POA: Diagnosis not present

## 2015-11-23 DIAGNOSIS — Z8546 Personal history of malignant neoplasm of prostate: Secondary | ICD-10-CM | POA: Diagnosis not present

## 2015-11-23 DIAGNOSIS — R05 Cough: Secondary | ICD-10-CM | POA: Diagnosis not present

## 2015-11-23 DIAGNOSIS — N390 Urinary tract infection, site not specified: Secondary | ICD-10-CM | POA: Diagnosis not present

## 2015-11-23 DIAGNOSIS — Z7982 Long term (current) use of aspirin: Secondary | ICD-10-CM | POA: Insufficient documentation

## 2015-11-23 DIAGNOSIS — B351 Tinea unguium: Secondary | ICD-10-CM | POA: Diagnosis not present

## 2015-11-23 DIAGNOSIS — M79674 Pain in right toe(s): Secondary | ICD-10-CM | POA: Diagnosis not present

## 2015-11-23 DIAGNOSIS — E785 Hyperlipidemia, unspecified: Secondary | ICD-10-CM | POA: Insufficient documentation

## 2015-11-23 DIAGNOSIS — Z87891 Personal history of nicotine dependence: Secondary | ICD-10-CM | POA: Insufficient documentation

## 2015-11-23 DIAGNOSIS — K5900 Constipation, unspecified: Secondary | ICD-10-CM | POA: Insufficient documentation

## 2015-11-23 DIAGNOSIS — M79675 Pain in left toe(s): Secondary | ICD-10-CM

## 2015-11-23 LAB — BASIC METABOLIC PANEL
ANION GAP: 4 — AB (ref 5–15)
BUN: 16 mg/dL (ref 6–20)
CHLORIDE: 111 mmol/L (ref 101–111)
CO2: 26 mmol/L (ref 22–32)
Calcium: 8.6 mg/dL — ABNORMAL LOW (ref 8.9–10.3)
Creatinine, Ser: 1.14 mg/dL (ref 0.61–1.24)
GFR calc Af Amer: >60 mL/min (ref >60)
GFR, EST NON AFRICAN AMERICAN: 55 mL/min — AB (ref >60)
Glucose, Bld: 97 mg/dL (ref 65–99)
POTASSIUM: 4 mmol/L (ref 3.5–5.1)
SODIUM: 141 mmol/L (ref 135–145)

## 2015-11-23 LAB — URINALYSIS, ROUTINE W REFLEX MICROSCOPIC
Bilirubin Urine: NEGATIVE
GLUCOSE, UA: NEGATIVE mg/dL
KETONES UR: NEGATIVE mg/dL
NITRITE: NEGATIVE
PH: 6.5 (ref 5.0–8.0)
Protein, ur: NEGATIVE mg/dL
SPECIFIC GRAVITY, URINE: 1.014 (ref 1.005–1.030)

## 2015-11-23 LAB — I-STAT TROPONIN, ED: Troponin i, poc: 0.03 ng/mL (ref 0.00–0.08)

## 2015-11-23 LAB — CBC
HEMATOCRIT: 38 % — AB (ref 39.0–52.0)
HEMOGLOBIN: 12.1 g/dL — AB (ref 13.0–17.0)
MCH: 28.9 pg (ref 26.0–34.0)
MCHC: 31.8 g/dL (ref 30.0–36.0)
MCV: 90.9 fL (ref 78.0–100.0)
Platelets: 184 10*3/uL (ref 150–400)
RBC: 4.18 MIL/uL — AB (ref 4.22–5.81)
RDW: 15.2 % (ref 11.5–15.5)
WBC: 3.7 10*3/uL — AB (ref 4.0–10.5)

## 2015-11-23 LAB — URINE MICROSCOPIC-ADD ON

## 2015-11-23 LAB — I-STAT CG4 LACTIC ACID, ED: LACTIC ACID, VENOUS: 0.84 mmol/L (ref 0.5–1.9)

## 2015-11-23 MED ORDER — CEPHALEXIN 500 MG PO CAPS: 500.0000 mg | ORAL_CAPSULE | Freq: Four times a day (QID) | ORAL | 0 refills | Status: DC

## 2015-11-23 MED ORDER — CEFTRIAXONE SODIUM 1 G IJ SOLR
1.0000 g | Freq: Once | INTRAMUSCULAR | Status: AC
  Administered 2015-11-23: 1 g via INTRAVENOUS
  Filled 2015-11-23: qty 10

## 2015-11-23 MED ORDER — SODIUM CHLORIDE 0.9 % IV BOLUS (SEPSIS)
500.0000 mL | Freq: Once | INTRAVENOUS | Status: AC
  Administered 2015-11-23: 500 mL via INTRAVENOUS

## 2015-11-23 NOTE — Patient Instructions (Signed)
Diabetes and Foot Care Diabetes may cause you to have problems because of poor blood supply (circulation) to your feet and legs. This may cause the skin on your feet to become thinner, break easier, and heal more slowly. Your skin may become dry, and the skin may peel and crack. You may also have nerve damage in your legs and feet causing decreased feeling in them. You may not notice minor injuries to your feet that could lead to infections or more serious problems. Taking care of your feet is one of the most important things you can do for yourself.  HOME CARE INSTRUCTIONS  Wear shoes at all times, even in the house. Do not go barefoot. Bare feet are easily injured.  Check your feet daily for blisters, cuts, and redness. If you cannot see the bottom of your feet, use a mirror or ask someone for help.  Wash your feet with warm water (do not use hot water) and mild soap. Then pat your feet and the areas between your toes until they are completely dry. Do not soak your feet as this can dry your skin.  Apply a moisturizing lotion or petroleum jelly (that does not contain alcohol and is unscented) to the skin on your feet and to dry, brittle toenails. Do not apply lotion between your toes.  Trim your toenails straight across. Do not dig under them or around the cuticle. File the edges of your nails with an emery board or nail file.  Do not cut corns or calluses or try to remove them with medicine.  Wear clean socks or stockings every day. Make sure they are not too tight. Do not wear knee-high stockings since they may decrease blood flow to your legs.  Wear shoes that fit properly and have enough cushioning. To break in new shoes, wear them for just a few hours a day. This prevents you from injuring your feet. Always look in your shoes before you put them on to be sure there are no objects inside.  Do not cross your legs. This may decrease the blood flow to your feet.  If you find a minor scrape,  cut, or break in the skin on your feet, keep it and the skin around it clean and dry. These areas may be cleansed with mild soap and water. Do not cleanse the area with peroxide, alcohol, or iodine.  When you remove an adhesive bandage, be sure not to damage the skin around it.  If you have a wound, look at it several times a day to make sure it is healing.  Do not use heating pads or hot water bottles. They may burn your skin. If you have lost feeling in your feet or legs, you may not know it is happening until it is too late.  Make sure your health care provider performs a complete foot exam at least annually or more often if you have foot problems. Report any cuts, sores, or bruises to your health care provider immediately. SEEK MEDICAL CARE IF:   You have an injury that is not healing.  You have cuts or breaks in the skin.  You have an ingrown nail.  You notice redness on your legs or feet.  You feel burning or tingling in your legs or feet.  You have pain or cramps in your legs and feet.  Your legs or feet are numb.  Your feet always feel cold. SEEK IMMEDIATE MEDICAL CARE IF:   There is increasing redness,   swelling, or pain in or around a wound.  There is a red line that goes up your leg.  Pus is coming from a wound.  You develop a fever or as directed by your health care provider.  You notice a bad smell coming from an ulcer or wound.   This information is not intended to replace advice given to you by your health care provider. Make sure you discuss any questions you have with your health care provider.   Document Released: 04/13/2000 Document Revised: 12/17/2012 Document Reviewed: 09/23/2012 Elsevier Interactive Patient Education 2016 [employer] Inc[employer]  

## 2015-11-23 NOTE — ED Notes (Signed)
Russell [name]; Transporter, transporting pt to x-ray.  

## 2015-11-23 NOTE — ED Provider Notes (Addendum)
[place] DEPT Provider Note   CSN: [id number] Arrival date & time: 11/23/15  1152  First Provider Contact:  None       History   Chief Complaint Chief Complaint  Patient presents with  . Weakness    HPI  The history is provided by the patient.  Marcquez Vizzi is a 82 y.o. male history dementia, DM, prostate cancer here presenting with weakness. Patient is from home and lives with his daughters. Patient states that he woke up this morning and just felt very weak all over. He usually walks by himself today has trouble even walking and needed assistance. Denies any trouble speaking. Denies any fever or chills. Denies any urinary symptoms.   Level V caveat- dementia    Past Medical History:  Diagnosis Date  . Anxiety associated with depression   . Colon cancer [facility])    s/p resection 12/06  . Dementia   . Diabetes mellitus    diet mgt (11`/12)  . FTT (failure to thrive)    admited 08/2008  . History of cystoscopy    uretheral stricutre, 2005- Dr.Humphreys  . Hyperlipidemia   . Hypertension   . Loss of hearing    being fitted for hearing aids (11/12)  . Osteoarthritis    hands  . Prostate cancer ([place])    s/p XRT  . SBO (small bowel obstruction) ([place]) 03/08/2014  . Urolithiasis     Patient Active Problem List   Diagnosis Date Noted  . Cough 09/29/2015  . Other seasonal allergic rhinitis 09/29/2015  . Diabetes mellitus type 2, diet-controlled ([place]) 07/19/2015  . Constipation 05/30/2015  . Routine general medical examination at a health care facility 01/27/2015  . Hyperglycemia 01/25/2015  . Mild dementia 11/05/2014  . Acute combined systolic and diastolic heart failure - EF 42% on echo 12/2013- medical therapy 01/25/2014  . DJD (degenerative joint disease) 02/20/2013  . Bradycardia 01/21/2012  . Major depressive disorder, recurrent episode, severe, without mention of psychotic behavior 08/08/2011  . PVC's (premature ventricular contractions) 01/31/2009  .  URINARY INCONTINENCE 07/16/2008  . ADENOCARCINOMA, COLON 05/13/2006  . ADENOCARCINOMA, PROSTATE 05/13/2006  . Hyperlipidemia with target LDL less than 100 05/13/2006  . Essential hypertension, benign 05/13/2006    Past Surgical History:  Procedure Laterality Date  . CATARACT EXTRACTION, BILATERAL    . HEMICOLECTOMY  12/06  . TRANSURETHRAL RESECTION OF PROSTATE    . xrt     ERT for prostate ca       Home Medications    Prior to Admission medications   Medication Sig Start Date End Date Taking? Authorizing Provider  ALPRAZolam Duanne Moron) 0.5 MG tablet TAKE 1 TABLET BY MOUTH THREE TIMES DAILY AS NEEDED FOR ANXIETY 09/12/15   Janith Lima, MD  aspirin 81 MG tablet Take 81 mg by mouth daily.      Historical Provider, MD  Azelastine-Fluticasone (DYMISTA) 137-50 MCG/ACT SUSP Place 1 Act into the nose 2 (two) times daily. 09/29/15   Janith Lima, MD  B Complex Vitamins (VITAMIN-B COMPLEX PO) Take 1 tablet by mouth daily.     Historical Provider, MD  fesoterodine (TOVIAZ) 8 MG TB24 tablet Take 1 tablet (8 mg total) by mouth daily. 05/13/14   Janith Lima, MD  hydrochlorothiazide (HYDRODIURIL) 25 MG tablet Take one tablet (25 mg) by mouth once daily as needed  07/18/15   Historical Provider, MD  HYDROcodone-acetaminophen (NORCO/VICODIN) 5-325 MG tablet Take 1 tablet by mouth every 6 (six) hours as needed for moderate  pain. 09/29/15   Janith Lima, MD  losartan (COZAAR) 50 MG tablet Take 1 tablet (50 mg total) by mouth daily. 09/07/15   Deboraha Sprang, MD  ONE TOUCH ULTRA TEST test strip USE TO CHECK BLOOD SUGARS UP TO THREE TIMES DAILY 02/08/15   Janith Lima, MD  [facility] DELICA LANCETS [id number] MISC USE TO TEST BLOOD SUGAR UP TO THREE TIMES DAILY 05/26/15   Janith Lima, MD  polyethylene glycol [facility] / GLYCOLAX) packet Take 17 g by mouth daily.    Historical Provider, MD  promethazine-dextromethorphan (PROMETHAZINE-DM) 6.25-15 MG/5ML syrup Take 5 mLs by mouth 4 (four) times daily as needed  for cough. 09/29/15   Janith Lima, MD  sertraline (ZOLOFT) 100 MG tablet TAKE 1 TABLET BY MOUTH DAILY 07/08/15   Janith Lima, MD  simvastatin (ZOCOR) 80 MG tablet Take 40 mg by mouth daily at 6 PM.  07/16/15   Historical Provider, MD    Family History Family History  Problem Relation Age of Onset  . Diabetes Daughter   . Cancer Neg Hx   . Early death Neg Hx   . Heart disease Neg Hx   . Hyperlipidemia Neg Hx   . Hypertension Neg Hx   . Kidney disease Neg Hx   . Stroke Neg Hx   . Heart attack Neg Hx     Social History Social History  Substance Use Topics  . Smoking status: Former Smoker    Quit date: 03/20/1967  . Smokeless tobacco: Never Used  . Alcohol use No     Comment: quit '68     Allergies   Review of patient's allergies indicates no known allergies.   Review of Systems Review of Systems  Neurological: Positive for weakness.  All other systems reviewed and are negative.    Physical Exam Updated Vital Signs BP 135/71   Pulse 63   Temp 97.9 F (36.6 C) (Oral)   Resp 17   Ht 5\' 8"  (1.727 m)   Wt 165 lb (74.8 kg)   SpO2 100%   BMI 25.09 kg/m   Physical Exam  Constitutional: He is oriented to person, place, and time.  Chronically ill, slightly dehydrated   HENT:  Head: Normocephalic.  MM slightly dry   Eyes: Pupils are equal, round, and reactive to light.  Neck: Normal range of motion. Neck supple.  Cardiovascular: Normal rate and regular rhythm.   Pulmonary/Chest: Effort normal and breath sounds normal.  Abdominal: Soft. Bowel sounds are normal.  Musculoskeletal: Normal range of motion.  Neurological: He is alert and oriented to person, place, and time.  Strength 4/5 throughout. Unable to sit up or stand up   Skin: Skin is warm.  Psychiatric: He has a normal mood and affect.  Nursing note and vitals reviewed.    ED Treatments / Results  Labs (all labs ordered are listed, but only abnormal results are displayed) Labs Reviewed  BASIC  METABOLIC PANEL - Abnormal; Notable for the following:       Result Value   Calcium 8.6 (*)    GFR calc non Af Amer 55 (*)    Anion gap 4 (*)    All other components within normal limits  CBC - Abnormal; Notable for the following:    WBC 3.7 (*)    RBC 4.18 (*)    Hemoglobin 12.1 (*)    HCT 38.0 (*)    All other components within normal limits  URINALYSIS, ROUTINE W REFLEX MICROSCOPIC (  NOT AT [facility]) - Abnormal; Notable for the following:    APPearance HAZY (*)    Hgb urine dipstick TRACE (*)    Leukocytes, UA LARGE (*)    All other components within normal limits  URINE MICROSCOPIC-ADD ON - Abnormal; Notable for the following:    Squamous Epithelial / LPF 0-5 (*)    Bacteria, UA MANY (*)    All other components within normal limits  CULTURE, BLOOD (ROUTINE X 2)  CULTURE, BLOOD (ROUTINE X 2)  I-STAT TROPOININ, ED  I-STAT CG4 LACTIC ACID, ED  CBG MONITORING, ED    EKG  EKG Interpretation  Date/Time:  Wednesday November 23 2015 11:58:33 EDT Ventricular Rate:  69 PR Interval:  200 QRS Duration: 94 QT Interval:  422 QTC Calculation: 452 R Axis:   31 Text Interpretation:  Normal sinus rhythm Minimal voltage criteria for LVH, may be normal variant Anterior infarct , age undetermined Abnormal ECG No significant change since last tracing Confirmed by Adlean Hardeman  MD, Lc Joynt ([id number]) on 11/23/2015 12:41:48 PM       Radiology Dg Chest 2 View  Result Date: 11/23/2015 CLINICAL DATA:  Cough and weakness for 1 week. EXAM: CHEST  2 VIEW COMPARISON:  PA and lateral chest 09/29/2015 and 04/06/2015. FINDINGS: The patient has small bilateral pleural effusions. There is marked cardiomegaly and pulmonary vascular congestion. Aortic atherosclerosis is noted. IMPRESSION: Cardiomegaly and pulmonary vascular congestion with small bilateral pleural effusions. Atherosclerosis. Electronically Signed   By: Inge Rise M.D.   On: 11/23/2015 13:31   Procedures Procedures (including critical care  time)  Medications Ordered in ED Medications  cefTRIAXone (ROCEPHIN) 1 g in dextrose 5 % 50 mL IVPB (not administered)  sodium chloride 0.9 % bolus 500 mL (500 mLs Intravenous New Bag/Given 11/23/15 1309)     Initial Impression / Assessment and Plan / ED Course  I have reviewed the triage vital signs and the nursing notes.  Pertinent labs & imaging results that were available during my care of the patient were reviewed by me and considered in my medical decision making (see chart for details).  Clinical Course   Josefina Raffetto is a 82 y.o. male here with weakness. Diffuse weakness and has difficulty sitting and unable stand up. Consider infection vs stroke. Will get CT head, labs, CXR, UA. Will hydrate and reassess.   1:37 PM UA + UTI with too many to count WBC. Given rocephin. Labs unremarkable. He is too weak to walk and will be admitted for IV abx.   3:10 PM Discussed case with Dr. Marily Memos from hospitalist, who reviewed records. He states that patient doesn't meet criteria to be admitted. Discussed with two daughters at bedside and with POA over the phone. Patient will likely have a lot of out of pocket cost if getting admitted for observation. Kim from case management discussed with him and will set up for home services. Will dc home with family on keflex.   Final Clinical Impressions(s) / ED Diagnoses   Final diagnoses:  None    New Prescriptions New Prescriptions   No medications on file     Drenda Freeze, MD 11/23/15 [place] Marlon Suleiman, MD 11/23/15 1511

## 2015-11-23 NOTE — Telephone Encounter (Signed)
Patient Name: KAYLUM FOXHOVEN  DOB: July 25, 1923    Initial Comment father BP 124/60, BS 129, feels tired, weak, walking very slow, bit confused this morning.    Nurse Assessment  Nurse: Leilani Merl, RN, Heather Date/Time (Eastern Time): 11/23/2015 10:08:55 AM  Confirm and document reason for call. If symptomatic, describe symptoms. You must click the next button to save text entered. ---Caller states that her fathers BP 124/60, BS 129, feels tired, weak, walking very slow, bit confused this morning.  Has the patient traveled out of the country within the last 30 days? ---Not Applicable  Does the patient have any new or worsening symptoms? ---Yes  Will a triage be completed? ---Yes  Related visit to physician within the last 2 weeks? ---No  Does the PT have any chronic conditions? (i.e. diabetes, asthma, etc.) ---Yes  List chronic conditions. ---See MR  Is this a behavioral health or substance abuse call? ---No     Guidelines    Guideline Title Affirmed Question Affirmed Notes  Weakness (Generalized) and Fatigue [1] MODERATE weakness (i.e., interferes with work, school, normal activities) AND [2] cause unknown (Exceptions: weakness with acute minor illness, or weakness from poor fluid intake)    Final Disposition User   See Physician within 4 Hours (or PCP triage) Leilani Merl, RN, Nira Conn    Comments  No appts available at office today. Patient does not want to go to the ED or UCC , he would like to be worked in to the office.   Referrals  REFERRED TO PCP OFFICE   Disagree/Comply: Comply

## 2015-11-23 NOTE — ED Triage Notes (Signed)
Patient here with increased general weakness x 1 day. Patient alert and oriented, denies pain. Family states was slow to respond to questions this am and slow to move around.

## 2015-11-23 NOTE — Progress Notes (Signed)
Patient ID: Russell [name], male   DOB: [birth year], 82 y.o.   MRN: [id number]  Subjective: This patient presents today complaining of painful toenails walking wearing shoes and is requesting nail debridement. The patient's daughter is present in the treatment room today since daughters request replacement diabetic shoes  Objective: Patient appears orientated 3 DP and PT pulses 2/4 bilaterally Sensation to 10 g monofilament wire intact 0/5 bilaterally Vibratory sensation nonreactive bilaterally Ankle reflexes weakly reactive bilaterally Hammertoe second bilaterally Slow gait requiring cane No open skin lesions noted bilaterally Mild distal keratoses hallux bilaterally The toenails are elongated, brittle, deformed, hypertrophic and tender to direct palpation 6-10  Assessment: Symptomatic onychomycoses 6-10 Type II diabetic diet-controlled Diabetic neuropathy Hammertoe second bilaterally Gait disturbance  Plan: Debridement toenails 10 mechanical and electrically without any bleeding Obtain certification for diabetic shoes  Reappoint 3 months

## 2015-11-23 NOTE — Discharge Instructions (Signed)
Stay hydrated.   Take keflex four times daily for 10 days.   Take tylenol for pain or fever.   [facility] will contact you.   See your doctor.   Return to ER if you have fever, trouble walking, vomiting, worse weakness.

## 2015-11-23 NOTE — Progress Notes (Signed)
Entered in d/c instructions [place]   [phone] [id number] [place] [id number]    Next Steps: Call on 11/24/2015    Instructions: call as needed [facility] This is the agency to provider you with home health nurse Physical therapy and aide They have been told of your request to have the staff to work with Russell [name] that saw him March

## 2015-11-23 NOTE — ED Notes (Signed)
EKG done in triage at 1158.

## 2015-11-23 NOTE — ED Notes (Signed)
Patient verbalized understanding of discharge instructions and denies any further needs or questions at this time. VS stable. Patient ambulatory with steady gait and use of walking stick. Escorted to ED entrance in wheelchair.

## 2015-11-23 NOTE — Progress Notes (Signed)
ED CM consulted for home health services for pt  Pt daughter spoke with Cm with pt permission Choice of [facility] health [facility] for [facility] requests the same [place] staff to work with pt States pt previously received services in 03/17 and appreciated [place] male working with pt Gayneele requests Cm call sister [name] at the home number to updated her on pt d/c with home services  Cm called referral in to [facility]a [facility] of [facility] at [phone] after orders entered in [place] to be initiated

## 2015-11-23 NOTE — Progress Notes (Signed)
ED CM called Drema Balzarine as Cyndra Numbers asked and she asked Cm on speaker phone to speak also with her husband, Dellis Filbert. Dellis Filbert states [name] is POA and that they would like to speak with EDP. CM discussed that home health with [facility] was set up already pending d/c.  Cm spoke with Dr Darl Householder and was able to get Dr Darl Householder, [name] and Dellis Filbert connected to speak with each other via phone [facility] of [facility] called to state actually services had been initiated in March 2017 but was refused on first visit Pt had been seen previously but not during March Updated her on discussion occurring now with EDP and POA   Pt will be followed for d/c needs

## 2015-11-25 DIAGNOSIS — Z85038 Personal history of other malignant neoplasm of large intestine: Secondary | ICD-10-CM | POA: Diagnosis not present

## 2015-11-25 DIAGNOSIS — R531 Weakness: Secondary | ICD-10-CM | POA: Diagnosis not present

## 2015-11-25 DIAGNOSIS — N390 Urinary tract infection, site not specified: Secondary | ICD-10-CM | POA: Diagnosis not present

## 2015-11-25 DIAGNOSIS — E119 Type 2 diabetes mellitus without complications: Secondary | ICD-10-CM | POA: Diagnosis not present

## 2015-11-25 DIAGNOSIS — Z7982 Long term (current) use of aspirin: Secondary | ICD-10-CM | POA: Diagnosis not present

## 2015-11-25 DIAGNOSIS — F0390 Unspecified dementia without behavioral disturbance: Secondary | ICD-10-CM | POA: Diagnosis not present

## 2015-11-25 DIAGNOSIS — Z8546 Personal history of malignant neoplasm of prostate: Secondary | ICD-10-CM | POA: Diagnosis not present

## 2015-11-26 LAB — URINE CULTURE: Culture: >=100000 — AB

## 2015-11-27 ENCOUNTER — Telehealth (HOSPITAL_BASED_OUTPATIENT_CLINIC_OR_DEPARTMENT_OTHER): Payer: Self-pay

## 2015-11-27 NOTE — Telephone Encounter (Signed)
Post ED Visit - Positive Culture Follow-up  Culture report reviewed by antimicrobial stewardship pharmacist:  []  Elenor Quinones, Pharm.D. []  Heide Guile, Pharm.D., BCPS []  Parks Neptune, Pharm.D. []  Alycia Rossetti, Pharm.D., BCPS []  Ruth, Pharm.D., BCPS, AAHIVP []  Legrand Como, Pharm.D., BCPS, AAHIVP []  Cassie Stewart, Pharm.D. []  Stephens November, Pharm.D. Ebony Hail Masters Pharm D Positive urine culture Treated with Cephalexin, organism sensitive to the same and no further patient follow-up is required at this time.  Genia Del 11/27/2015, 12:03 PM

## 2015-11-28 LAB — CULTURE, BLOOD (ROUTINE X 2)
CULTURE: NO GROWTH
Culture: NO GROWTH

## 2015-11-29 ENCOUNTER — Ambulatory Visit (INDEPENDENT_AMBULATORY_CARE_PROVIDER_SITE_OTHER): Payer: PPO | Admitting: Internal Medicine

## 2015-11-29 ENCOUNTER — Encounter: Payer: Self-pay | Admitting: Internal Medicine

## 2015-11-29 ENCOUNTER — Other Ambulatory Visit (INDEPENDENT_AMBULATORY_CARE_PROVIDER_SITE_OTHER): Payer: PPO

## 2015-11-29 VITALS — BP 122/58 | HR 76 | Resp 16 | Wt 164.0 lb

## 2015-11-29 DIAGNOSIS — N390 Urinary tract infection, site not specified: Secondary | ICD-10-CM

## 2015-11-29 DIAGNOSIS — K5901 Slow transit constipation: Secondary | ICD-10-CM

## 2015-11-29 LAB — URINALYSIS, ROUTINE W REFLEX MICROSCOPIC
Bilirubin Urine: NEGATIVE
HGB URINE DIPSTICK: NEGATIVE
KETONES UR: NEGATIVE
NITRITE: NEGATIVE
RBC / HPF: NONE SEEN (ref 0–?)
SPECIFIC GRAVITY, URINE: 1.01 (ref 1.000–1.030)
TOTAL PROTEIN, URINE-UPE24: NEGATIVE
URINE GLUCOSE: NEGATIVE
UROBILINOGEN UA: 1 (ref 0.0–1.0)
pH: 6.5 (ref 5.0–8.0)

## 2015-11-29 MED ORDER — POLYETHYLENE GLYCOL 3350 17 G PO PACK: 17.0000 g | PACK | Freq: Every day | ORAL | 11 refills | Status: DC

## 2015-11-29 NOTE — Patient Instructions (Signed)

## 2015-11-29 NOTE — Progress Notes (Signed)
Pre visit review using our clinic review tool, if applicable. No additional management support is needed unless otherwise documented below in the visit note. 

## 2015-11-29 NOTE — Progress Notes (Signed)
Subjective:  Patient ID: Russell [name], male    DOB: May 30, 1923  Age: 82 y.o. MRN: [id number]  CC: Urinary Tract Infection   HPI Russell [name] presents for follow-up on a recent urinary tract infection. He was seen at the hospital within the last week and had UTI symptoms and a urine culture was positive for Escherichia coli. He has taken a several day course of Keflex and tells me that all the symptoms have resolved. He denies abdominal pain, nausea, vomiting, fever, chills, dysuria, hematuria, or flank pain.  Outpatient Medications Prior to Visit  Medication Sig Dispense Refill  . ALPRAZolam (XANAX) 0.5 MG tablet TAKE 1 TABLET BY MOUTH THREE TIMES DAILY AS NEEDED FOR ANXIETY (Patient taking differently: TAKE 1 TABLET EVERY MORNING) 60 tablet 3  . aspirin 81 MG tablet Take 81 mg by mouth daily.      . Azelastine-Fluticasone (DYMISTA) 137-50 MCG/ACT SUSP Place 1 Act into the nose 2 (two) times daily. 1 Bottle 11  . B Complex Vitamins (VITAMIN-B COMPLEX PO) Take 1 tablet by mouth daily.     . fesoterodine (TOVIAZ) 8 MG TB24 tablet Take 1 tablet (8 mg total) by mouth daily. 30 tablet 11  . Ginkgo Biloba (GINKGO PO) Take 1 capsule by mouth daily.    . hydrochlorothiazide (HYDRODIURIL) 25 MG tablet Take one tablet (25 mg) by mouth once daily as needed     . HYDROcodone-acetaminophen (NORCO/VICODIN) 5-325 MG tablet Take 1 tablet by mouth every 6 (six) hours as needed for moderate pain. 65 tablet 0  . losartan (COZAAR) 50 MG tablet Take 1 tablet (50 mg total) by mouth daily. 90 tablet 3  . Multiple Vitamin (MULTI-VITAMIN PO) Take 1 tablet by mouth daily.    . sertraline (ZOLOFT) 100 MG tablet TAKE 1 TABLET BY MOUTH DAILY 90 tablet 2  . simvastatin (ZOCOR) 80 MG tablet Take 40 mg by mouth daily at 6 PM.   3  . cephALEXin (KEFLEX) 500 MG capsule Take 1 capsule (500 mg total) by mouth 4 (four) times daily. 40 capsule 0  . polyethylene glycol (MIRALAX / GLYCOLAX) packet Take 17 g by mouth daily.    .  promethazine-dextromethorphan (PROMETHAZINE-DM) 6.25-15 MG/5ML syrup Take 5 mLs by mouth 4 (four) times daily as needed for cough. 118 mL 0   No facility-administered medications prior to visit.     ROS Review of Systems  Constitutional: Negative for activity change, chills, fatigue and fever.  HENT: Negative.   Eyes: Negative.   Respiratory: Negative for cough, chest tightness, shortness of breath and stridor.   Cardiovascular: Negative for chest pain, palpitations and leg swelling.  Gastrointestinal: Positive for constipation. Negative for abdominal pain, diarrhea, nausea and vomiting.  Endocrine: Negative.   Genitourinary: Negative.  Negative for decreased urine volume, dysuria, flank pain, frequency, hematuria and urgency.  Musculoskeletal: Negative.   Skin: Negative.   Allergic/Immunologic: Negative.   Neurological: Negative.  Negative for dizziness, tremors, weakness, numbness and headaches.  Hematological: Negative.  Negative for adenopathy. Does not bruise/bleed easily.  Psychiatric/Behavioral: Negative.     Objective:  BP (!) 122/58   Pulse 76   Resp 16   Wt 164 lb (74.4 kg)   SpO2 97%   BMI 24.94 kg/m   BP Readings from Last 3 Encounters:  11/29/15 (!) 122/58  11/23/15 158/69  09/29/15 (!) 110/58    Wt Readings from Last 3 Encounters:  11/29/15 164 lb (74.4 kg)  11/23/15 165 lb (74.8 kg)  09/29/15 160 lb (  72.6 kg)    Physical Exam  Constitutional: He is oriented to person, place, and time. No distress.  HENT:  Mouth/Throat: Oropharynx is clear and moist. No oropharyngeal exudate.  Eyes: Conjunctivae are normal. Left eye exhibits no discharge. No scleral icterus.  Neck: Normal range of motion. Neck supple. No JVD present. No tracheal deviation present. No thyromegaly present.  Cardiovascular: Normal rate, regular rhythm, normal heart sounds and intact distal pulses.  Exam reveals no gallop and no friction rub.   No murmur heard. Pulmonary/Chest: Effort  normal and breath sounds normal. No stridor. No respiratory distress. He has no wheezes. He has no rales. He exhibits no tenderness.  Abdominal: Soft. Bowel sounds are normal. He exhibits no distension and no mass. There is no hepatosplenomegaly. There is no tenderness. There is no rebound, no guarding and no CVA tenderness.  Musculoskeletal: Normal range of motion. He exhibits no edema, tenderness or deformity.  Lymphadenopathy:    He has no cervical adenopathy.  Neurological: He is oriented to person, place, and time.  Skin: Skin is warm and dry. No rash noted. He is not diaphoretic. No erythema. No pallor.  Vitals reviewed.   Lab Results  Component Value Date   WBC 3.7 (L) 11/23/2015   HGB 12.1 (L) 11/23/2015   HCT 38.0 (L) 11/23/2015   PLT 184 11/23/2015   GLUCOSE 97 11/23/2015   CHOL 124 01/25/2015   TRIG 74.0 01/25/2015   HDL 46.50 01/25/2015   LDLCALC 63 01/25/2015   ALT 10 (L) 07/20/2015   AST 18 07/20/2015   NA 141 11/23/2015   K 4.0 11/23/2015   CL 111 11/23/2015   CREATININE 1.14 11/23/2015   BUN 16 11/23/2015   CO2 26 11/23/2015   TSH 1.86 01/25/2015   PSA 1.94 02/20/2013   HGBA1C 5.6 07/19/2015   MICROALBUR 2.2 (H) 01/25/2015   URINE, CATHETERIZED   Special Requests NONE  Culture >=100,000 COLONIES/mL ESCHERICHIA COLI   Report Status 11/26/2015 FINAL  Organism ID, Bacteria ESCHERICHIA COLI   Resulting Agency [facility]  Susceptibility    Escherichia coli   MIC  AMPICILLIN >=32 RESIST... Resistant  AMPICILLIN/SULBACTAM >=32 RESIST... Resistant  CEFAZOLIN <=4 SENSITIVE "><=4 SENSITIVE  Sensitive  CEFTRIAXONE <=1 SENSITIVE "><=1 SENSITIVE  Sensitive  CIPROFLOXACIN <=0.25 SENSITIVE "><=0.25 SENS... Sensitive  GENTAMICIN <=1 SENSITIVE "><=1 SENSITIVE  Sensitive  IMIPENEM <=0.25 SENSITIVE "><=0.25 SENS... Sensitive  NITROFURANTOIN <=16 SENSITIVE "><=16 SENSIT... Sensitive  PIP/TAZO <=4 SENSITIVE "><=4 SENSITIVE  Sensitive  TRIMETH/SULFA <=20 SENSITIVE  "><=20 SENSIT... Sensitive      Susceptibility Comments   Escherichia coli  >=100,000 COLONIES/mL ESCHERICHIA COLI    Specimen Collected: 11/23/15 14:47 Last Resulted: 11/26/15 09:42                  Other Results from 11/23/2015   I-Stat CG4 Lactic Acid, ED  Order: [id number]   Status:  Final result Visible to patient:  No (Not Released) Next appt:  02/21/2016 at 08:15 AM in Podiatry [facility], DPM)   Ref Range & Units 6d ago  Lactic Acid, Venous 0.5 - 1.9 mmol/L 0.84  Resulting Agency  [facility]    Specimen Collected: 11/23/15 13:22 Last Resulted: 11/23/15 13:23                   I-stat troponin, ED  Order: [id number]   Status:  Final result Visible to patient:  No (Not Released) Next appt:  02/21/2016 at 08:15 AM in Podiatry ([facility]ins, [place])    Ref  Range & Units 6d ago 1moago 751yrgo   Troponin i, poc 0.00 - 0.08 ng/mL 0.03 0.01 <0.05R   Comment 3       CM   Comments: Due to the release kinetics of cTnI,  a negative result within the first hours  of the onset of symptoms does not rule out  myocardial infarction with certainty.  If myocardial infarction is still suspected,  repeat the test at appropriate intervals.   Resulting Agency  [facility]    Specimen Collected: 11/23/15 13:19 Last Resulted: 11/23/15 13:32           CM=Additional commentsR=Reference range differs from displayed range          Blood culture (routine x 2)  Order: [id number] Status:  Final result Visible to patient:  No (Not Released) Next appt:  02/21/2016 at 08:15 AM in Podiatry ([facility]ins[facility])  Newer results are available. Click to view them now.   6d ago  Specimen Description BLOOD RIGHT HAND  Special Requests BOTTLES DRAWN AEROBIC AND ANAEROBIC 2CC  Culture NO GROWTH 5 DAYS  Report Status 11/28/2015 FINAL  Resulting Agency [facility]    Specimen Collected: 11/23/15 13:10 Last Resulted: 11/28/15 15:59                      Blood culture (routine x 2)  Order: [id number] Status:  Final result Visible to patient:  No (Not Released) Next appt:  02/21/2016 at 08:15 AM in Podiatry ([facility])  Newer results are available. Click to view them now.   6d ago  Specimen Description BLOOD RIGHT FOREARM  Special Requests BOTTLES DRAWN AEROBIC AND ANAEROBIC 5CC  Culture NO GROWTH 5 DAYS  Report Status 11/28/2015 FINAL  Resulting Agency [facility]    Specimen Collected: 11/23/15 13:08 Last Resulted: 11/28/15 15:59                      Urinalysis, Routine w reflex microscopic  Order: [id number] Status:  Final result Visible to patient:  No (Not Released) Next appt:  02/21/2016 at 08:15 AM in Podiatry ([facility] Martins[facility])    Ref Range & Units 6d ago (11/23/15) 33m23moo (07/27/15) 33mo79mo (07/19/15)   Color, Urine YELLOW YELLOW YELLOWR YELLOW   APPearance CLEAR HAZY  CLEARR HAZY    Specific Gravity, Urine 1.005 - 1.030 1.014 1.015R 1.014   pH 5.0 - 8.0 6.5 5.5 6.5   Glucose, UA NEGATIVE mg/dL NEGATIVE  NEGATIVE   Hgb urine dipstick NEGATIVE TRACE  NEGATIVER NEGATIVE   Bilirubin Urine NEGATIVE NEGATIVE NEGATIVER NEGATIVE   Ketones, ur NEGATIVE mg/dL NEGATIVE NEGATIVER NEGATIVE   Protein, ur NEGATIVE mg/dL NEGATIVE  NEGATIVE   Nitrite NEGATIVE NEGATIVE NEGATIVER NEGATIVE   Leukocytes, UA NEGATIVE LARGE  NEGATIVER MODERATE    Total Protein, Urine   NEGATIVE    Urine Glucose   NEGATIVE    Urobilinogen, UA   0.2    WBC, UA   0-2/hpf    RBC / HPF   0-2/hpf    Squamous Epithelial / LPF   Rare(0-4/hpf)   Resulting Agency  [facility]    Specimen Collected: 11/23/15 13:00 Last Resulted: 11/23/15 13:30            R=Reference range differs from displayed range            Urine microscopic-add on  Order: [id number]:  Final  result Visible to patient:  No (Not Released) Next appt:  02/21/2016 at 08:15 AM in Podiatry [facility], DPM)     Ref Range & Units 6d ago (11/23/15) 35moago (07/27/15) 495mogo (07/19/15)   Squamous Epithelial / LPF NONE SEEN 0-5  Rare(0-4/hpf)R 0-5    WBC, UA 0 - 5 WBC/hpf TOO NUMEROUS... 0-2/hpfR 6-30  TOO NUMEROUS TO COUNT   RBC / HPF 0 - 5 RBC/hpf 0-5 0-2/hpfR 0-5   Bacteria, UA NONE SEEN MANY   MANY   Resulting Agency  [facility] [place] [facility]    Specimen Collected: 11/23/15 13:00 Last Resulted: 11/23/15 13:30            R=Reference range differs from displayed range            Basic metabolic panel  Order: [id number] Status:  Final result Visible to patient:  No (Not Released) Next appt:  02/21/2016 at 08:15 AM in Podiatry ([facility]and[facility] MartinsDPM)    Ref Range & Units 6d ago (11/23/15) 38m22moo (07/20/15) 38mo638mo (07/19/15)   Sodium 135 - 145 mmol/L 141 141 142   Potassium 3.5 - 5.1 mmol/L 4.0 4.0 4.0   Chloride 101 - 111 mmol/L 111 108 106   CO2 22 - 32 mmol/L _0 Glucose, Bld 65 - 99 mg/dL 97 120  116    BUN 6 - 20 mg/dL _1 Creatinine, Ser 0.61 - 1.24 mg/dL 1.14 1.20 1.52    Calcium 8.9 - 10.3 mg/dL 8.6  8.4  9.0   GFR calc non Af Amer >60 mL/min 55  52  39    GFR calc Af Amer >60 mL/min >60 60CM  45CM   Comments: (NOTE)  The eGFR has been calculated using the CKD EPI equation.  This calculation has not been validated in all clinical situations.  eGFR's persistently <60 mL/min signify possible Chronic Kidney  Disease.    Anion gap 5 - _2 Resulting Agency  [facility]    Specimen Collected: 11/23/15 12:06 Last Resulted: 11/23/15 12:40            CM=Additional comments            CBC  Order: [id number]:  Final result Visible to patient:  No (Not Released) Next appt:  02/21/2016 at 08:15 AM in [facility]    Ref Range & Units 6d ago (11/23/15) 38mo 79mo(07/20/15) 38mo a50mo3/21/17)   WBC 4.0 - 10.5 K/uL 3.7  4.6 4.3   RBC 4.22 - 5.81 MIL/uL 4.18  4.10  4.37   Hemoglobin 13.0 - 17.0 g/dL  12.1  11.9  12.8    HCT 39.0 - 52.0 % 38.0  35.8  38.5    MCV 78.0 - 100.0 fL 90.9 87.3 88.1   MCH 26.0 - 34.0 pg 28.9 29.0 29.3   MCHC 30.0 - 36.0 g/dL 31.8 33.2 33.2   RDW 11.5 - 15.5 % 15.2 15.2 15.2   Platelets 150 - 400 K/uL 184 167 141    Neutrophils Relative %    73   Basophils Absolute    0.0   Neutro Abs    3.1   Lymphocytes Relative    16   Lymphs Abs    0.7   Monocytes Relative    7   Monocytes Absolute    0.3   Eosinophils Relative  4   Eosinophils Absolute    0.2   Basophils Relative    0  Resulting Agency  [facility]    Specimen Collected: 11/23/15 12:06 Last Resulted: 11/23/15 12:41                      Dg Chest 2 View  Result Date: 11/23/2015 CLINICAL DATA:  Cough and weakness for 1 week. EXAM: CHEST  2 VIEW COMPARISON:  PA and lateral chest 09/29/2015 and 04/06/2015. FINDINGS: The patient has small bilateral pleural effusions. There is marked cardiomegaly and pulmonary vascular congestion. Aortic atherosclerosis is noted. IMPRESSION: Cardiomegaly and pulmonary vascular congestion with small bilateral pleural effusions. Atherosclerosis. Electronically Signed   By: Inge Rise M.D.   On: 11/23/2015 13:31  Ct Head Wo Contrast  Result Date: 11/23/2015 CLINICAL DATA:  Generalized weakness since yesterday. EXAM: CT HEAD WITHOUT CONTRAST TECHNIQUE: Contiguous axial images were obtained from the base of the skull through the vertex without intravenous contrast. COMPARISON:  03/08/2005 FINDINGS: There is no evidence for acute hemorrhage, hydrocephalus, mass lesion, or abnormal extra-axial fluid collection. No definite CT evidence for acute infarction. Diffuse loss of parenchymal volume is consistent with atrophy. Patchy low attenuation in the deep hemispheric and periventricular white matter is nonspecific, but likely reflects chronic microvascular ischemic demyelination. The visualized paranasal sinuses and mastoid air cells are clear. IMPRESSION: 1.  Stable.  No acute intracranial abnormality. 2. Atrophy with chronic small vessel white matter ischemic demyelination. Electronically Signed   By: Misty Stanley M.D.   On: 11/23/2015 14:15   Assessment & Plan:   Chloe was seen today for urinary tract infection.  Diagnoses and all orders for this visit:  UTI (lower urinary tract infection)- his symptoms have resolved and his urinalysis is nearly normal now, his urine culture is now negative for Escherichia coli but is positive for Pseudomonas A. Based on this information I think this is a colonization and does not need to be treated. He was encouraged to complete the course of Keflex as directed. -     Urinalysis, Routine w reflex microscopic (not at [facility]); Future -     CULTURE, URINE COMPREHENSIVE; Future  Slow transit constipation- he is getting symptom relief with MiraLAX as needed. -     polyethylene glycol (MIRALAX / GLYCOLAX) packet; Take 17 g by mouth daily.   I have discontinued Russell [name] promethazine-dextromethorphan and cephALEXin. I am also having him maintain his aspirin, B Complex Vitamins (VITAMIN-B COMPLEX PO), fesoterodine, sertraline, hydrochlorothiazide, simvastatin, losartan, ALPRAZolam, HYDROcodone-acetaminophen, Azelastine-Fluticasone, Multiple Vitamin (MULTI-VITAMIN PO), Ginkgo Biloba (GINKGO PO), and polyethylene glycol.  Meds ordered this encounter  Medications  . polyethylene glycol (MIRALAX / GLYCOLAX) packet    Sig: Take 17 g by mouth daily.    Dispense:  14 each    Refill:  11     Follow-up: Return in about 3 months (around 02/29/2016).  Scarlette Calico, MD

## 2015-12-02 LAB — CULTURE, URINE COMPREHENSIVE

## 2015-12-12 ENCOUNTER — Other Ambulatory Visit: Payer: Self-pay | Admitting: Internal Medicine

## 2015-12-13 ENCOUNTER — Other Ambulatory Visit (INDEPENDENT_AMBULATORY_CARE_PROVIDER_SITE_OTHER): Payer: PPO

## 2015-12-13 ENCOUNTER — Ambulatory Visit (INDEPENDENT_AMBULATORY_CARE_PROVIDER_SITE_OTHER)
Admission: RE | Admit: 2015-12-13 | Discharge: 2015-12-13 | Disposition: A | Discharge status: Home / Self Care | Payer: PPO | Source: Ambulatory Visit | Attending: Internal Medicine | Admitting: Internal Medicine

## 2015-12-13 ENCOUNTER — Ambulatory Visit (INDEPENDENT_AMBULATORY_CARE_PROVIDER_SITE_OTHER): Payer: PPO | Admitting: Internal Medicine

## 2015-12-13 ENCOUNTER — Encounter: Payer: Self-pay | Admitting: Internal Medicine

## 2015-12-13 ENCOUNTER — Ambulatory Visit: Payer: Self-pay | Admitting: Internal Medicine

## 2015-12-13 VITALS — BP 136/74 | HR 79 | Temp 97.9°F | Resp 16 | Ht 68.0 in | Wt 166.2 lb

## 2015-12-13 DIAGNOSIS — R059 Cough, unspecified: Secondary | ICD-10-CM

## 2015-12-13 DIAGNOSIS — J189 Pneumonia, unspecified organism: Secondary | ICD-10-CM

## 2015-12-13 DIAGNOSIS — R05 Cough: Secondary | ICD-10-CM

## 2015-12-13 DIAGNOSIS — R079 Chest pain, unspecified: Secondary | ICD-10-CM | POA: Diagnosis not present

## 2015-12-13 DIAGNOSIS — N390 Urinary tract infection, site not specified: Secondary | ICD-10-CM | POA: Diagnosis not present

## 2015-12-13 DIAGNOSIS — R0602 Shortness of breath: Secondary | ICD-10-CM | POA: Diagnosis not present

## 2015-12-13 LAB — URINALYSIS, ROUTINE W REFLEX MICROSCOPIC
Bilirubin Urine: NEGATIVE
Hgb urine dipstick: NEGATIVE
KETONES UR: NEGATIVE
Nitrite: NEGATIVE
PH: 6 (ref 5.0–8.0)
SPECIFIC GRAVITY, URINE: 1.01 (ref 1.000–1.030)
TOTAL PROTEIN, URINE-UPE24: NEGATIVE
URINE GLUCOSE: NEGATIVE
UROBILINOGEN UA: 0.2 (ref 0.0–1.0)

## 2015-12-13 MED ORDER — PROMETHAZINE-DM 6.25-15 MG/5ML PO SYRP: 5.0000 mL | ORAL_SOLUTION | Freq: Four times a day (QID) | ORAL | 0 refills | Status: DC | PRN

## 2015-12-13 MED ORDER — CEFDINIR 300 MG PO CAPS: 300.0000 mg | ORAL_CAPSULE | Freq: Two times a day (BID) | ORAL | 1 refills | Status: DC

## 2015-12-13 NOTE — Progress Notes (Signed)
Pre visit review using our clinic review tool, if applicable. No additional management support is needed unless otherwise documented below in the visit note. 

## 2015-12-13 NOTE — Patient Instructions (Signed)

## 2015-12-13 NOTE — Progress Notes (Signed)
Subjective:  Patient ID: Russell [name], male    DOB: 1923-06-21  Age: 82 y.o. MRN: [id number]  CC: Cough and Urinary Tract Infection   HPI Russell [name] presents for for concerns about a 2 day history of cough with fatigue and weakness. He tells me his cough is mostly nonproductive. He has had a slight loss of appetite but denies fever/chills/night sweats/hemoptysis/abdominal plain/nausea/vomiting.  He recently had a UTI but denies dysuria/hematuria/urgency/bladder pain.  Outpatient Medications Prior to Visit  Medication Sig Dispense Refill  . ALPRAZolam (XANAX) 0.5 MG tablet TAKE 1 TABLET BY MOUTH THREE TIMES DAILY AS NEEDED FOR ANXIETY (Patient taking differently: TAKE 1 TABLET EVERY MORNING) 60 tablet 3  . aspirin 81 MG tablet Take 81 mg by mouth daily.      . Azelastine-Fluticasone (DYMISTA) 137-50 MCG/ACT SUSP Place 1 Act into the nose 2 (two) times daily. 1 Bottle 11  . B Complex Vitamins (VITAMIN-B COMPLEX PO) Take 1 tablet by mouth daily.     . fesoterodine (TOVIAZ) 8 MG TB24 tablet Take 1 tablet (8 mg total) by mouth daily. 30 tablet 11  . Ginkgo Biloba (GINKGO PO) Take 1 capsule by mouth daily.    . hydrochlorothiazide (HYDRODIURIL) 25 MG tablet Take one tablet (25 mg) by mouth once daily as needed     . HYDROcodone-acetaminophen (NORCO/VICODIN) 5-325 MG tablet Take 1 tablet by mouth every 6 (six) hours as needed for moderate pain. 65 tablet 0  . losartan (COZAAR) 50 MG tablet Take 1 tablet (50 mg total) by mouth daily. 90 tablet 3  . Multiple Vitamin (MULTI-VITAMIN PO) Take 1 tablet by mouth daily.    . polyethylene glycol (MIRALAX / GLYCOLAX) packet Take 17 g by mouth daily. 14 each 11  . sertraline (ZOLOFT) 100 MG tablet TAKE 1 TABLET BY MOUTH DAILY 90 tablet 2  . simvastatin (ZOCOR) 80 MG tablet Take 40 mg by mouth daily at 6 PM.   3   No facility-administered medications prior to visit.     ROS Review of Systems  Constitutional: Positive for activity change, appetite  change and fatigue. Negative for chills, diaphoresis, fever and unexpected weight change.  HENT: Negative.  Negative for facial swelling, sore throat and trouble swallowing.   Eyes: Negative.  Negative for visual disturbance.  Respiratory: Positive for cough. Negative for apnea, choking, chest tightness, shortness of breath, wheezing and stridor.   Cardiovascular: Negative.  Negative for chest pain, palpitations and leg swelling.  Gastrointestinal: Negative.  Negative for abdominal pain, constipation, diarrhea, nausea and vomiting.  Endocrine: Negative.   Genitourinary: Negative for decreased urine volume, difficulty urinating, dysuria, flank pain, frequency, penile swelling and urgency.  Musculoskeletal: Negative.  Negative for back pain, myalgias and neck pain.  Skin: Negative.  Negative for color change and rash.  Allergic/Immunologic: Negative.   Neurological: Negative.   Hematological: Negative.  Negative for adenopathy. Does not bruise/bleed easily.  Psychiatric/Behavioral: Negative.     Objective:  BP 136/74 (BP Location: Left Arm, Patient Position: Sitting, Cuff Size: Normal)   Pulse 79   Temp 97.9 F (36.6 C) (Oral)   Ht 5\' 8"  (1.727 m)   Wt 166 lb 4 oz (75.4 kg)   SpO2 92%   BMI 25.28 kg/m   BP Readings from Last 3 Encounters:  12/13/15 136/74  11/29/15 (!) 122/58  11/23/15 158/69    Wt Readings from Last 3 Encounters:  12/13/15 166 lb 4 oz (75.4 kg)  11/29/15 164 lb (74.4 kg)  11/23/15 165  lb (74.8 kg)    Physical Exam  Constitutional: He is oriented to person, place, and time. No distress.  HENT:  Mouth/Throat: Oropharynx is clear and moist. No oropharyngeal exudate.  Eyes: Conjunctivae are normal. Right eye exhibits no discharge. Left eye exhibits no discharge. No scleral icterus.  Neck: Normal range of motion. Neck supple. No JVD present. No tracheal deviation present. No thyromegaly present.  Cardiovascular: Normal rate, regular rhythm, normal heart sounds  and intact distal pulses.  Exam reveals no gallop and no friction rub.   No murmur heard. Pulmonary/Chest: Effort normal. No accessory muscle usage. No respiratory distress. He has decreased breath sounds in the right upper field, the right middle field, the right lower field, the left upper field, the left middle field and the left lower field. He has no wheezes. He has no rhonchi. He has rales in the right lower field and the left lower field. He exhibits no tenderness.  Abdominal: Soft. Bowel sounds are normal. He exhibits no distension and no mass. There is no tenderness. There is no rebound and no guarding.  Musculoskeletal: Normal range of motion. He exhibits no edema, tenderness or deformity.  Lymphadenopathy:    He has no cervical adenopathy.  Neurological: He is oriented to person, place, and time.  Skin: Skin is warm and dry. No rash noted. He is not diaphoretic. No erythema. No pallor.  Vitals reviewed.   Lab Results  Component Value Date   WBC 3.7 (L) 11/23/2015   HGB 12.1 (L) 11/23/2015   HCT 38.0 (L) 11/23/2015   PLT 184 11/23/2015   GLUCOSE 97 11/23/2015   CHOL 124 01/25/2015   TRIG 74.0 01/25/2015   HDL 46.50 01/25/2015   LDLCALC 63 01/25/2015   ALT 10 (L) 07/20/2015   AST 18 07/20/2015   NA 141 11/23/2015   K 4.0 11/23/2015   CL 111 11/23/2015   CREATININE 1.14 11/23/2015   BUN 16 11/23/2015   CO2 26 11/23/2015   TSH 1.86 01/25/2015   PSA 1.94 02/20/2013   HGBA1C 5.6 07/19/2015   MICROALBUR 2.2 (H) 01/25/2015    Dg Chest 2 View  Result Date: 11/23/2015 CLINICAL DATA:  Cough and weakness for 1 week. EXAM: CHEST  2 VIEW COMPARISON:  PA and lateral chest 09/29/2015 and 04/06/2015. FINDINGS: The patient has small bilateral pleural effusions. There is marked cardiomegaly and pulmonary vascular congestion. Aortic atherosclerosis is noted. IMPRESSION: Cardiomegaly and pulmonary vascular congestion with small bilateral pleural effusions. Atherosclerosis. Electronically  Signed   By: Inge Rise M.D.   On: 11/23/2015 13:31  Ct Head Wo Contrast  Result Date: 11/23/2015 CLINICAL DATA:  Generalized weakness since yesterday. EXAM: CT HEAD WITHOUT CONTRAST TECHNIQUE: Contiguous axial images were obtained from the base of the skull through the vertex without intravenous contrast. COMPARISON:  03/08/2005 FINDINGS: There is no evidence for acute hemorrhage, hydrocephalus, mass lesion, or abnormal extra-axial fluid collection. No definite CT evidence for acute infarction. Diffuse loss of parenchymal volume is consistent with atrophy. Patchy low attenuation in the deep hemispheric and periventricular white matter is nonspecific, but likely reflects chronic microvascular ischemic demyelination. The visualized paranasal sinuses and mastoid air cells are clear. IMPRESSION: 1. Stable.  No acute intracranial abnormality. 2. Atrophy with chronic small vessel white matter ischemic demyelination. Electronically Signed   By: Misty Stanley M.D.   On: 11/23/2015 14:15   Assessment & Plan:   Russell [name] was seen today for cough and urinary tract infection.  Diagnoses and all orders for this  visit:  UTI (lower urinary tract infection)- his urinalysis is unremarkable, will recheck his urine culture, he will receive a cephalosporin for pneumonia so this may cover a urinary tract infection as well. -     Urinalysis, Routine w reflex microscopic (not at [facility]); Future -     CULTURE, URINE COMPREHENSIVE; Future -     cefdinir (OMNICEF) 300 MG capsule; Take 1 capsule (300 mg total) by mouth 2 (two) times daily.  Cough- his chest x-ray reveals concern for bilateral lower lobe pneumonia -     DG Chest 2 View; Future -     promethazine-dextromethorphan (PROMETHAZINE-DM) 6.25-15 MG/5ML syrup; Take 5 mLs by mouth 4 (four) times daily as needed for cough.  CAP (community acquired pneumonia)- I will treat the infection with cefdinir and will offer Phenergan DM as needed for the cough. -     DG  Chest 2 View; Future -     promethazine-dextromethorphan (PROMETHAZINE-DM) 6.25-15 MG/5ML syrup; Take 5 mLs by mouth 4 (four) times daily as needed for cough. -     cefdinir (OMNICEF) 300 MG capsule; Take 1 capsule (300 mg total) by mouth 2 (two) times daily.   I am having Russell [name] start on promethazine-dextromethorphan and cefdinir. I am also having him maintain his aspirin, B Complex Vitamins (VITAMIN-B COMPLEX PO), fesoterodine, sertraline, hydrochlorothiazide, simvastatin, losartan, ALPRAZolam, HYDROcodone-acetaminophen, Azelastine-Fluticasone, Multiple Vitamin (MULTI-VITAMIN PO), Ginkgo Biloba (GINKGO PO), and polyethylene glycol.  Meds ordered this encounter  Medications  . promethazine-dextromethorphan (PROMETHAZINE-DM) 6.25-15 MG/5ML syrup    Sig: Take 5 mLs by mouth 4 (four) times daily as needed for cough.    Dispense:  118 mL    Refill:  0  . cefdinir (OMNICEF) 300 MG capsule    Sig: Take 1 capsule (300 mg total) by mouth 2 (two) times daily.    Dispense:  20 capsule    Refill:  1     Follow-up: Return in about 2 weeks (around 12/27/2015).  Scarlette Calico, MD

## 2015-12-14 ENCOUNTER — Telehealth: Payer: Self-pay

## 2015-12-14 NOTE — Telephone Encounter (Signed)
Pt dtr informed of results of the chest xray and will call back to make the appt.

## 2015-12-16 ENCOUNTER — Emergency Department (HOSPITAL_COMMUNITY): Payer: PPO

## 2015-12-16 ENCOUNTER — Encounter (HOSPITAL_COMMUNITY): Payer: Self-pay | Admitting: Emergency Medicine

## 2015-12-16 ENCOUNTER — Inpatient Hospital Stay (HOSPITAL_COMMUNITY)
Admission: EM | Admit: 2015-12-16 | Discharge: 2015-12-22 | DRG: 291 | Disposition: A | Discharge status: Home Health | Payer: PPO | Attending: Internal Medicine | Admitting: Internal Medicine

## 2015-12-16 DIAGNOSIS — I5023 Acute on chronic systolic (congestive) heart failure: Secondary | ICD-10-CM | POA: Diagnosis not present

## 2015-12-16 DIAGNOSIS — I429 Cardiomyopathy, unspecified: Secondary | ICD-10-CM | POA: Diagnosis not present

## 2015-12-16 DIAGNOSIS — N179 Acute kidney failure, unspecified: Secondary | ICD-10-CM | POA: Diagnosis present

## 2015-12-16 DIAGNOSIS — R5381 Other malaise: Secondary | ICD-10-CM

## 2015-12-16 DIAGNOSIS — M1990 Unspecified osteoarthritis, unspecified site: Secondary | ICD-10-CM | POA: Diagnosis present

## 2015-12-16 DIAGNOSIS — H9190 Unspecified hearing loss, unspecified ear: Secondary | ICD-10-CM | POA: Diagnosis present

## 2015-12-16 DIAGNOSIS — E785 Hyperlipidemia, unspecified: Secondary | ICD-10-CM | POA: Diagnosis present

## 2015-12-16 DIAGNOSIS — I5041 Acute combined systolic (congestive) and diastolic (congestive) heart failure: Secondary | ICD-10-CM | POA: Diagnosis not present

## 2015-12-16 DIAGNOSIS — I509 Heart failure, unspecified: Secondary | ICD-10-CM

## 2015-12-16 DIAGNOSIS — N390 Urinary tract infection, site not specified: Secondary | ICD-10-CM | POA: Diagnosis not present

## 2015-12-16 DIAGNOSIS — F0390 Unspecified dementia without behavioral disturbance: Secondary | ICD-10-CM | POA: Diagnosis present

## 2015-12-16 DIAGNOSIS — I110 Hypertensive heart disease with heart failure: Principal | ICD-10-CM | POA: Diagnosis present

## 2015-12-16 DIAGNOSIS — G4700 Insomnia, unspecified: Secondary | ICD-10-CM | POA: Diagnosis present

## 2015-12-16 DIAGNOSIS — R05 Cough: Secondary | ICD-10-CM | POA: Diagnosis present

## 2015-12-16 DIAGNOSIS — I080 Rheumatic disorders of both mitral and aortic valves: Secondary | ICD-10-CM | POA: Diagnosis present

## 2015-12-16 DIAGNOSIS — Z8546 Personal history of malignant neoplasm of prostate: Secondary | ICD-10-CM | POA: Diagnosis not present

## 2015-12-16 DIAGNOSIS — J189 Pneumonia, unspecified organism: Secondary | ICD-10-CM | POA: Diagnosis present

## 2015-12-16 DIAGNOSIS — Z7982 Long term (current) use of aspirin: Secondary | ICD-10-CM

## 2015-12-16 DIAGNOSIS — Z9842 Cataract extraction status, left eye: Secondary | ICD-10-CM

## 2015-12-16 DIAGNOSIS — E119 Type 2 diabetes mellitus without complications: Secondary | ICD-10-CM | POA: Diagnosis not present

## 2015-12-16 DIAGNOSIS — Z85038 Personal history of other malignant neoplasm of large intestine: Secondary | ICD-10-CM

## 2015-12-16 DIAGNOSIS — K5901 Slow transit constipation: Secondary | ICD-10-CM

## 2015-12-16 DIAGNOSIS — R0602 Shortness of breath: Secondary | ICD-10-CM | POA: Diagnosis not present

## 2015-12-16 DIAGNOSIS — Z87891 Personal history of nicotine dependence: Secondary | ICD-10-CM | POA: Diagnosis not present

## 2015-12-16 DIAGNOSIS — Z9841 Cataract extraction status, right eye: Secondary | ICD-10-CM | POA: Diagnosis not present

## 2015-12-16 DIAGNOSIS — I349 Nonrheumatic mitral valve disorder, unspecified: Secondary | ICD-10-CM | POA: Diagnosis not present

## 2015-12-16 DIAGNOSIS — R059 Cough, unspecified: Secondary | ICD-10-CM | POA: Diagnosis present

## 2015-12-16 DIAGNOSIS — I5043 Acute on chronic combined systolic (congestive) and diastolic (congestive) heart failure: Secondary | ICD-10-CM | POA: Diagnosis not present

## 2015-12-16 DIAGNOSIS — F418 Other specified anxiety disorders: Secondary | ICD-10-CM | POA: Diagnosis present

## 2015-12-16 DIAGNOSIS — R778 Other specified abnormalities of plasma proteins: Secondary | ICD-10-CM

## 2015-12-16 DIAGNOSIS — R7989 Other specified abnormal findings of blood chemistry: Secondary | ICD-10-CM

## 2015-12-16 DIAGNOSIS — B965 Pseudomonas (aeruginosa) (mallei) (pseudomallei) as the cause of diseases classified elsewhere: Secondary | ICD-10-CM | POA: Diagnosis present

## 2015-12-16 DIAGNOSIS — I5021 Acute systolic (congestive) heart failure: Secondary | ICD-10-CM | POA: Diagnosis not present

## 2015-12-16 DIAGNOSIS — I248 Other forms of acute ischemic heart disease: Secondary | ICD-10-CM | POA: Diagnosis present

## 2015-12-16 HISTORY — DX: Pneumonia, unspecified organism: J18.9

## 2015-12-16 LAB — COMPREHENSIVE METABOLIC PANEL
ALBUMIN: 3.4 g/dL — AB (ref 3.5–5.0)
ALT: 39 U/L (ref 17–63)
AST: 45 U/L — AB (ref 15–41)
Alkaline Phosphatase: 84 U/L (ref 38–126)
Anion gap: 8 (ref 5–15)
BUN: 16 mg/dL (ref 6–20)
CHLORIDE: 106 mmol/L (ref 101–111)
CO2: 20 mmol/L — AB (ref 22–32)
CREATININE: 1.31 mg/dL — AB (ref 0.61–1.24)
Calcium: 8.9 mg/dL (ref 8.9–10.3)
GFR calc Af Amer: 54 mL/min — ABNORMAL LOW (ref >60)
GFR, EST NON AFRICAN AMERICAN: 46 mL/min — AB (ref >60)
GLUCOSE: 91 mg/dL (ref 65–99)
POTASSIUM: 3.9 mmol/L (ref 3.5–5.1)
SODIUM: 134 mmol/L — AB (ref 135–145)
Total Bilirubin: 0.8 mg/dL (ref 0.3–1.2)
Total Protein: 6 g/dL — ABNORMAL LOW (ref 6.5–8.1)

## 2015-12-16 LAB — CBC WITH DIFFERENTIAL/PLATELET
BASOS ABS: 0 10*3/uL (ref 0.0–0.1)
BASOS PCT: 0 %
EOS PCT: 4 %
Eosinophils Absolute: 0.2 10*3/uL (ref 0.0–0.7)
HCT: 36.9 % — ABNORMAL LOW (ref 39.0–52.0)
Hemoglobin: 12.5 g/dL — ABNORMAL LOW (ref 13.0–17.0)
LYMPHS PCT: 24 %
Lymphs Abs: 0.9 10*3/uL (ref 0.7–4.0)
MCH: 29.6 pg (ref 26.0–34.0)
MCHC: 33.9 g/dL (ref 30.0–36.0)
MCV: 87.2 fL (ref 78.0–100.0)
MONO ABS: 0.6 10*3/uL (ref 0.1–1.0)
Monocytes Relative: 15 %
Neutro Abs: 2.1 10*3/uL (ref 1.7–7.7)
Neutrophils Relative %: 57 %
PLATELETS: 214 10*3/uL (ref 150–400)
RBC: 4.23 MIL/uL (ref 4.22–5.81)
RDW: 15.1 % (ref 11.5–15.5)
WBC: 3.7 10*3/uL — AB (ref 4.0–10.5)

## 2015-12-16 LAB — CULTURE, URINE COMPREHENSIVE

## 2015-12-16 LAB — BRAIN NATRIURETIC PEPTIDE: B NATRIURETIC PEPTIDE 5: 2189.3 pg/mL — AB (ref 0.0–100.0)

## 2015-12-16 LAB — TROPONIN I: Troponin I: 0.18 ng/mL (ref <0.03)

## 2015-12-16 MED ORDER — FUROSEMIDE 10 MG/ML IJ SOLN
80.0000 mg | Freq: Once | INTRAMUSCULAR | Status: AC
  Administered 2015-12-16: 80 mg via INTRAVENOUS
  Filled 2015-12-16: qty 8

## 2015-12-16 MED ORDER — ASPIRIN 81 MG PO CHEW
324.0000 mg | CHEWABLE_TABLET | Freq: Once | ORAL | Status: AC
  Administered 2015-12-16: 324 mg via ORAL
  Filled 2015-12-16: qty 4

## 2015-12-16 NOTE — ED Notes (Addendum)
Critical lab reported to Dr. Dayna Barker. Trop I

## 2015-12-16 NOTE — ED Provider Notes (Signed)
[facility] DEPT Provider Note   CSN: [id number] Arrival date & time: 12/16/15  1453     History   Chief Complaint Chief Complaint  Patient presents with  . Pneumonia    HPI Russell [name] is a 82 y.o. male.   Shortness of Breath  This is a new problem. The current episode started more than 2 days ago. The problem has been gradually worsening. Associated symptoms include cough and leg swelling. Pertinent negatives include no fever, no rhinorrhea, no sore throat and no chest pain. Treatments tried: antibiotics. The treatment provided no relief. He has had no prior hospitalizations. He has had no prior ED visits. He has had no prior ICU admissions.    Past Medical History:  Diagnosis Date  . Anxiety associated with depression   . Colon cancer [facility])    s/p resection 12/06  . Dementia   . Diabetes mellitus    diet mgt (11`/12)  . FTT (failure to thrive)    admited 08/2008  . History of cystoscopy    uretheral stricutre, 2005- Dr.Humphreys  . Hyperlipidemia   . Hypertension   . Loss of hearing    being fitted for hearing aids (11/12)  . Osteoarthritis    hands  . Pneumonia   . Prostate cancer ([place])    s/p XRT  . SBO (small bowel obstruction) ([place]) 03/08/2014  . Urolithiasis     Patient Active Problem List   Diagnosis Date Noted  . Acute CHF (congestive heart failure) ([place]) 12/16/2015  . Acute CHF ([place]) 12/16/2015  . CAP (community acquired pneumonia) 12/13/2015  . UTI (lower urinary tract infection) 11/23/2015  . Other seasonal allergic rhinitis 09/29/2015  . AKI (acute kidney injury) ([place]) 07/19/2015  . Diabetes mellitus type 2, diet-controlled ([place]) 07/19/2015  . Constipation 05/30/2015  . Cough 04/06/2015  . Routine general medical examination at a health care facility 01/27/2015  . Hyperglycemia 01/25/2015  . Mild dementia 11/05/2014  . Acute combined systolic and diastolic heart failure - EF 42% on echo 12/2013- medical therapy 01/25/2014  . DJD  (degenerative joint disease) 02/20/2013  . Bradycardia 01/21/2012  . Major depressive disorder, recurrent episode, severe, without mention of psychotic behavior 08/08/2011  . PVC's (premature ventricular contractions) 01/31/2009  . URINARY INCONTINENCE 07/16/2008  . ADENOCARCINOMA, COLON 05/13/2006  . ADENOCARCINOMA, PROSTATE 05/13/2006  . Hyperlipidemia with target LDL less than 100 05/13/2006  . Essential hypertension, benign 05/13/2006    Past Surgical History:  Procedure Laterality Date  . CATARACT EXTRACTION, BILATERAL    . HEMICOLECTOMY  12/06  . TRANSURETHRAL RESECTION OF PROSTATE    . xrt     ERT for prostate ca       Home Medications    Prior to Admission medications   Medication Sig Start Date End Date Taking? Authorizing Provider  ALPRAZolam (XANAX) 0.5 MG tablet TAKE 1 TABLET BY MOUTH THREE TIMES DAILY AS NEEDED FOR ANXIETY Patient taking differently: TAKE 1 TABLET EVERY MORNING 09/12/15  Yes Janith Lima, MD  aspirin 81 MG tablet Take 81 mg by mouth daily.     Yes Historical Provider, MD  Azelastine-Fluticasone (DYMISTA) 137-50 MCG/ACT SUSP Place 1 Act into the nose 2 (two) times daily. Patient taking differently: Place 1 spray into both nostrils 2 (two) times daily as needed (for allergies).  09/29/15  Yes Janith Lima, MD  B Complex Vitamins (VITAMIN-B COMPLEX PO) Take 1 tablet by mouth daily.    Yes Historical Provider, MD  cefdinir (OMNICEF) 300 MG  capsule Take 1 capsule (300 mg total) by mouth 2 (two) times daily. 12/13/15 12/23/15 Yes Janith Lima, MD  Cyanocobalamin (VITAMIN B-12 PO) Take 1 tablet by mouth daily with breakfast.   Yes Historical Provider, MD  fesoterodine (TOVIAZ) 8 MG TB24 tablet Take 1 tablet (8 mg total) by mouth daily. 05/13/14  Yes Janith Lima, MD  Ginkgo Biloba (GINKGO PO) Take 1 capsule by mouth 2 (two) times daily.    Yes Historical Provider, MD  hydrochlorothiazide (HYDRODIURIL) 25 MG tablet Take one tablet (25 mg) by mouth once  daily as needed  07/18/15  Yes Historical Provider, MD  HYDROcodone-acetaminophen (NORCO/VICODIN) 5-325 MG tablet Take 1 tablet by mouth every 6 (six) hours as needed for moderate pain. 09/29/15  Yes Janith Lima, MD  ibuprofen (ADVIL,MOTRIN) 200 MG tablet Take 200-400 mg by mouth every 6 (six) hours as needed for headache or mild pain.   Yes Historical Provider, MD  losartan (COZAAR) 50 MG tablet Take 1 tablet (50 mg total) by mouth daily. 09/07/15  Yes Deboraha Sprang, MD  Multiple Vitamin (MULTI-VITAMIN PO) Take 1 tablet by mouth daily.   Yes Historical Provider, MD  polyethylene glycol (MIRALAX / GLYCOLAX) packet Take 17 g by mouth daily. Patient taking differently: Take 17 g by mouth daily. [place] AND DRINK 11/29/15  Yes Janith Lima, MD  promethazine-dextromethorphan (PROMETHAZINE-DM) 6.25-15 MG/5ML syrup Take 5 mLs by mouth 4 (four) times daily as needed for cough. 12/13/15  Yes Janith Lima, MD  sertraline (ZOLOFT) 100 MG tablet TAKE 1 TABLET BY MOUTH DAILY Patient taking differently: Take 100 mg by mouth once daily 07/08/15  Yes Janith Lima, MD  simvastatin (ZOCOR) 80 MG tablet Take 40 mg by mouth daily at 6 PM.  07/16/15  Yes Historical Provider, MD    Family History Family History  Problem Relation Age of Onset  . Diabetes Daughter   . Cancer Neg Hx   . Early death Neg Hx   . Heart disease Neg Hx   . Hyperlipidemia Neg Hx   . Hypertension Neg Hx   . Kidney disease Neg Hx   . Stroke Neg Hx   . Heart attack Neg Hx     Social History Social History  Substance Use Topics  . Smoking status: Former Smoker    Quit date: 03/20/1967  . Smokeless tobacco: Never Used  . Alcohol use No     Comment: quit '68     Allergies   Review of patient's allergies indicates no known allergies.   Review of Systems Review of Systems  Constitutional: Negative for fever.  HENT: Negative for rhinorrhea and sore throat.   Respiratory: Positive for cough and shortness of breath.     Cardiovascular: Positive for leg swelling. Negative for chest pain.  All other systems reviewed and are negative.    Physical Exam Updated Vital Signs BP 140/79   Pulse 83   Temp 98.2 F (36.8 C) (Oral)   Resp 26   Ht 5\' 4"  (1.626 m)   Wt 160 lb (72.6 kg)   SpO2 98%   BMI 27.46 kg/m   Physical Exam  Constitutional: He appears well-developed and well-nourished.  HENT:  Head: Normocephalic and atraumatic.  Eyes: Conjunctivae are normal.  Neck: Neck supple.  Cardiovascular: Normal rate and regular rhythm.   No murmur heard. Pulmonary/Chest: Effort normal. No respiratory distress. He has rales.  Abdominal: Soft. There is no tenderness.  Musculoskeletal: He exhibits edema.  Neurological:  He is alert.  Skin: Skin is warm and dry.  Psychiatric: He has a normal mood and affect.  Nursing note and vitals reviewed.    ED Treatments / Results  Labs (all labs ordered are listed, but only abnormal results are displayed) Labs Reviewed  CBC WITH DIFFERENTIAL/PLATELET - Abnormal; Notable for the following:       Result Value   WBC 3.7 (*)    Hemoglobin 12.5 (*)    HCT 36.9 (*)    All other components within normal limits  COMPREHENSIVE METABOLIC PANEL - Abnormal; Notable for the following:    Sodium 134 (*)    CO2 20 (*)    Creatinine, Ser 1.31 (*)    Total Protein 6.0 (*)    Albumin 3.4 (*)    AST 45 (*)    GFR calc non Af Amer 46 (*)    GFR calc Af Amer 54 (*)    All other components within normal limits  BRAIN NATRIURETIC PEPTIDE - Abnormal; Notable for the following:    B Natriuretic Peptide 2,189.3 (*)    All other components within normal limits  TROPONIN I - Abnormal; Notable for the following:    Troponin I 0.18 (*)    All other components within normal limits    EKG  EKG Interpretation None       Radiology Dg Chest 2 View  Result Date: 12/16/2015 CLINICAL DATA:  Shortness of breath and congestion.  Pneumonia. EXAM: CHEST  2 VIEW COMPARISON:   Two-view chest x-ray 12/13/2015 FINDINGS: The heart is enlarged. Bilateral pleural effusions have increased. Pulmonary vascular congestion is present. Bibasilar airspace disease has increased as well. Atherosclerotic calcifications are present at the aortic arch. Degenerate changes are noted at both shoulders. The visualized soft tissues and bony thorax are unremarkable. IMPRESSION: 1. Cardiomegaly with increasing bilateral pleural effusions and bibasilar airspace disease. This is most concerning for congestive heart failure. 2. Increasing pulmonary vascular congestion. 3. Bibasilar airspace disease likely reflects atelectasis. Early infection is not excluded. 4. Atherosclerosis of the thoracic aorta. Electronically Signed   By: San Morelle M.D.   On: 12/16/2015 17:19    Procedures Procedures (including critical care time)  Medications Ordered in ED Medications  furosemide (LASIX) injection 80 mg (80 mg Intravenous Given 12/16/15 2120)  aspirin chewable tablet 324 mg (324 mg Oral Given 12/16/15 2121)     Initial Impression / Assessment and Plan / ED Course  I have reviewed the triage vital signs and the nursing notes.  Pertinent labs & imaging results that were available during my care of the patient were reviewed by me and considered in my medical decision making (see chart for details).  Clinical Course    82 year old male with orthopnea, exertional dyspnea and PND. Was his primary doctor a few days ago was diagnosed with pneumonia started on antibiotics but has not gotten better and in fact has gotten worse. Exam here as lower show any edema, JVD, crackles. Normal respiratory rate and oxygen saturation. Chest x-ray, troponin and BNP are all consistent with CHF exacerbation. On review of records his last echo was in 2015 with an EF of 42%. Has not been on any diuretic since that time so I suspect this patient did benefit from inpatient admission for diuresis and further workup.  Final  Clinical Impressions(s) / ED Diagnoses   Final diagnoses:  Acute on chronic congestive heart failure, unspecified congestive heart failure type [facility])    New Prescriptions New Prescriptions  No medications on file     Merrily Pew, MD 12/16/15 2356

## 2015-12-16 NOTE — ED Triage Notes (Addendum)
Pt's daughter st's pt was recently dx with pneumonia.  St's pt is currently taking antibiotic but not getting any better.  Daughter st's if he is sitting up he is fine bu when he lays down he becomes SOB and starts wheezing.  Pt denies any pain or discomfort

## 2015-12-17 DIAGNOSIS — I5041 Acute combined systolic (congestive) and diastolic (congestive) heart failure: Secondary | ICD-10-CM | POA: Diagnosis not present

## 2015-12-17 DIAGNOSIS — N179 Acute kidney failure, unspecified: Secondary | ICD-10-CM | POA: Diagnosis not present

## 2015-12-17 DIAGNOSIS — R05 Cough: Secondary | ICD-10-CM

## 2015-12-17 DIAGNOSIS — I509 Heart failure, unspecified: Secondary | ICD-10-CM

## 2015-12-17 DIAGNOSIS — N390 Urinary tract infection, site not specified: Secondary | ICD-10-CM | POA: Diagnosis not present

## 2015-12-17 DIAGNOSIS — E119 Type 2 diabetes mellitus without complications: Secondary | ICD-10-CM | POA: Diagnosis not present

## 2015-12-17 LAB — TROPONIN I
TROPONIN I: 0.2 ng/mL — AB (ref <0.03)
TROPONIN I: 0.21 ng/mL — AB (ref <0.03)
Troponin I: 0.19 ng/mL (ref <0.03)

## 2015-12-17 LAB — BASIC METABOLIC PANEL
Anion gap: 8 (ref 5–15)
BUN: 14 mg/dL (ref 6–20)
CALCIUM: 8.9 mg/dL (ref 8.9–10.3)
CO2: 24 mmol/L (ref 22–32)
CREATININE: 1.29 mg/dL — AB (ref 0.61–1.24)
Chloride: 106 mmol/L (ref 101–111)
GFR calc non Af Amer: 47 mL/min — ABNORMAL LOW (ref >60)
GFR, EST AFRICAN AMERICAN: 55 mL/min — AB (ref >60)
Glucose, Bld: 74 mg/dL (ref 65–99)
Potassium: 3.7 mmol/L (ref 3.5–5.1)
SODIUM: 138 mmol/L (ref 135–145)

## 2015-12-17 LAB — APTT: aPTT: 29 seconds (ref 24–36)

## 2015-12-17 LAB — GLUCOSE, CAPILLARY
GLUCOSE-CAPILLARY: 120 mg/dL — AB (ref 65–99)
GLUCOSE-CAPILLARY: 132 mg/dL — AB (ref 65–99)
Glucose-Capillary: 113 mg/dL — ABNORMAL HIGH (ref 65–99)

## 2015-12-17 LAB — PROTIME-INR
INR: 1.22
PROTHROMBIN TIME: 15.5 s — AB (ref 11.4–15.2)

## 2015-12-17 LAB — MAGNESIUM: MAGNESIUM: 1.7 mg/dL (ref 1.7–2.4)

## 2015-12-17 MED ORDER — ATORVASTATIN CALCIUM 40 MG PO TABS
40.0000 mg | ORAL_TABLET | Freq: Every day | ORAL | Status: DC
  Administered 2015-12-17 – 2015-12-22 (×6): 40 mg via ORAL
  Filled 2015-12-17 (×6): qty 1

## 2015-12-17 MED ORDER — ALPRAZOLAM 0.5 MG PO TABS
0.5000 mg | ORAL_TABLET | Freq: Every morning | ORAL | Status: DC
  Administered 2015-12-17 – 2015-12-22 (×6): 0.5 mg via ORAL
  Filled 2015-12-17 (×6): qty 1

## 2015-12-17 MED ORDER — FUROSEMIDE 10 MG/ML IJ SOLN
40.0000 mg | Freq: Two times a day (BID) | INTRAMUSCULAR | Status: DC
Start: 2015-12-17 — End: 2015-12-18
  Administered 2015-12-17 – 2015-12-18 (×3): 40 mg via INTRAVENOUS
  Filled 2015-12-17 (×3): qty 4

## 2015-12-17 MED ORDER — ACETAMINOPHEN 325 MG PO TABS: 650.0000 mg | ORAL_TABLET | ORAL | Status: DC | PRN

## 2015-12-17 MED ORDER — INSULIN ASPART 100 UNIT/ML ~~LOC~~ SOLN
0.0000 [IU] | Freq: Three times a day (TID) | SUBCUTANEOUS | Status: DC
  Administered 2015-12-18 – 2015-12-19 (×3): 1 [IU] via SUBCUTANEOUS

## 2015-12-17 MED ORDER — POTASSIUM CHLORIDE CRYS ER 20 MEQ PO TBCR
40.0000 meq | EXTENDED_RELEASE_TABLET | Freq: Every day | ORAL | Status: DC
  Administered 2015-12-17 – 2015-12-18 (×2): 40 meq via ORAL
  Filled 2015-12-17 (×2): qty 2

## 2015-12-17 MED ORDER — LEVOFLOXACIN IN D5W 750 MG/150ML IV SOLN
750.0000 mg | INTRAVENOUS | Status: DC
  Administered 2015-12-17: 750 mg via INTRAVENOUS
  Filled 2015-12-17: qty 150

## 2015-12-17 MED ORDER — SODIUM CHLORIDE 0.9% FLUSH: 3.0000 mL | INTRAVENOUS | Status: DC | PRN

## 2015-12-17 MED ORDER — SODIUM CHLORIDE 0.9 % IV SOLN
250.0000 mL | INTRAVENOUS | Status: DC | PRN
  Administered 2015-12-18: 250 mL via INTRAVENOUS

## 2015-12-17 MED ORDER — POLYETHYLENE GLYCOL 3350 17 G PO PACK
17.0000 g | PACK | Freq: Every day | ORAL | Status: DC
  Administered 2015-12-17 – 2015-12-22 (×6): 17 g via ORAL
  Filled 2015-12-17 (×6): qty 1

## 2015-12-17 MED ORDER — HYDROCODONE-ACETAMINOPHEN 5-325 MG PO TABS
1.0000 | ORAL_TABLET | Freq: Four times a day (QID) | ORAL | Status: DC | PRN
  Administered 2015-12-18: 1 via ORAL
  Filled 2015-12-17: qty 1

## 2015-12-17 MED ORDER — ADULT MULTIVITAMIN W/MINERALS CH
1.0000 | ORAL_TABLET | Freq: Every day | ORAL | Status: DC
  Administered 2015-12-17 – 2015-12-22 (×6): 1 via ORAL
  Filled 2015-12-17 (×6): qty 1

## 2015-12-17 MED ORDER — ASPIRIN EC 81 MG PO TBEC
81.0000 mg | DELAYED_RELEASE_TABLET | Freq: Every day | ORAL | Status: DC
  Administered 2015-12-17 – 2015-12-22 (×6): 81 mg via ORAL
  Filled 2015-12-17 (×6): qty 1

## 2015-12-17 MED ORDER — ENOXAPARIN SODIUM 40 MG/0.4ML ~~LOC~~ SOLN
40.0000 mg | Freq: Every day | SUBCUTANEOUS | Status: DC
  Administered 2015-12-17 – 2015-12-18 (×2): 40 mg via SUBCUTANEOUS
  Filled 2015-12-17 (×3): qty 0.4

## 2015-12-17 MED ORDER — SERTRALINE HCL 100 MG PO TABS
100.0000 mg | ORAL_TABLET | Freq: Every day | ORAL | Status: DC
  Administered 2015-12-17 – 2015-12-22 (×6): 100 mg via ORAL
  Filled 2015-12-17 (×6): qty 1

## 2015-12-17 MED ORDER — FESOTERODINE FUMARATE ER 8 MG PO TB24
8.0000 mg | ORAL_TABLET | Freq: Every day | ORAL | Status: DC
Start: 2015-12-17 — End: 2015-12-22
  Administered 2015-12-17 – 2015-12-22 (×6): 8 mg via ORAL
  Filled 2015-12-17 (×6): qty 1

## 2015-12-17 MED ORDER — SODIUM CHLORIDE 0.9% FLUSH
3.0000 mL | Freq: Two times a day (BID) | INTRAVENOUS | Status: DC
  Administered 2015-12-17 – 2015-12-22 (×11): 3 mL via INTRAVENOUS

## 2015-12-17 MED ORDER — ONDANSETRON HCL 4 MG/2ML IJ SOLN: 4.0000 mg | Freq: Four times a day (QID) | INTRAMUSCULAR | Status: DC | PRN

## 2015-12-17 NOTE — ED Notes (Signed)
Attempted to call report

## 2015-12-17 NOTE — Progress Notes (Signed)
[facility]                                                                              Patient Demographics  Russell [name], is a 82 y.o. male, DOB - 01-19-1924, [id number]  Admit date - 12/16/2015   Admitting Physician Lily Kocher, MD  Outpatient Primary MD for the patient is Russell Calico, MD  Outpatient specialists:   LOS - 1  days    Chief Complaint  Patient presents with  . Pneumonia       Brief summary   Russell [name] is a 82 y.o. gentleman with a history of dementia, HTN, HLD, DM, prostate and colon cancer who presents to the ED with several of his children for evaluation of persistent cough (refractory to antibiotics and anti-tussives), orthopnea, and PND.  The patient saw his PCP three days ago with similar complaints and was diagnosed with pneumonia as well as a UTI.  He was given a prescription for omnicef.  He has not improved.  He has significant lower extremity edema.  Shortness of breath is worse when lying flat.  He has had symptoms of PND.  The family does not check his weight daily and they do not follow any real dietary or fluid restrictions.   At baseline, he ambulates independently with a cane. ED Course: Chest xray suggestive of pulmonary edema.  BNP greater than 2000.  First troponin 0.18.  He does not have an oxygen requirement.  He has been given lasix 80mg  IV x one.  Of note, last echo in 2015 showed an EF of 42% with grade I diastolic dysfunction.  The patient has not been on lasix at home.  Family reports that the patient is followed by Dr. Caryl Comes of cardiology. Also, outpatient urine culture from earlier this week growing pseudomonas and enterococcus species.  Assessment & Plan    Acute on chronic combined CHF - Patient presented with shortness of breath, chest x-ray suggestive of pulmonary edema, BNP 2189 - Serial troponins positive, however patient presented with acute on chronic CHF.  - Follow 2-D echo, if patient has wall motion  abnormalities or depressed EF, will consult cardiology - Continue IV Lasix, negative balance of 2.3 L - Daily weight, serial I's and O's  Acute kidney injury -Improving, likely due to UTI, - Hold ARB, HCTZ  Diabetes mellitus -Continue sliding scale insulin, hemoglobin A1c    enterococcus, pseudomonas UTI -Continue levofloxacin for 7 days  Code Status: Full CODE STATUS  DVT Prophylaxis:  Lovenox  Family Communication: Discussed in detail with the patient, all imaging results, lab results explained to the patient   Disposition Plan:  Time Spent in minutes   87minutes  Procedures:    Consultants:     Antimicrobials:   Levaquin 8/19   Medications  Scheduled Meds: . ALPRAZolam  0.5 mg Oral q morning - 10a  . aspirin EC  81 mg Oral Daily  . atorvastatin  40 mg Oral q1800  . enoxaparin (LOVENOX) injection  40 mg Subcutaneous Daily  . fesoterodine  8 mg Oral Daily  . furosemide  40 mg Intravenous Q12H  . insulin aspart  0-9 Units Subcutaneous TID WC  . levofloxacin (LEVAQUIN) IV  750 mg Intravenous Q48H  . multivitamin with minerals  1 tablet Oral Daily  . polyethylene glycol  17 g Oral Daily  . potassium chloride  40 mEq Oral Daily  . sertraline  100 mg Oral Daily  . sodium chloride flush  3 mL Intravenous Q12H   Continuous Infusions:  PRN Meds:.sodium chloride, acetaminophen, HYDROcodone-acetaminophen, ondansetron (ZOFRAN) IV, sodium chloride flush   Antibiotics   Anti-infectives    Start     Dose/Rate Route Frequency Ordered Stop   12/17/15 0130  levofloxacin (LEVAQUIN) IVPB 750 mg     750 mg 100 mL/hr over 90 Minutes Intravenous Every 48 hours 12/17/15 0125          Subjective:   [name] was seen and examined today.  Feeling better today, shortness of breath improving, no complains at this time. No chest pain. Patient denies dizziness,  abdominal pain, N/V/D/C, new weakness, numbess, tingling. No acute events overnight.  No fevers and  chills.  Objective:   Vitals:   12/17/15 0700 12/17/15 0730 12/17/15 0842 12/17/15 1205  BP: 157/65 143/78 134/64 139/72  Pulse: 84 73 78 73  Resp: 18 20 18 18   Temp:   98.2 F (36.8 C) 97.6 F (36.4 C)  TempSrc:   Oral Oral  SpO2: 97% 98% 98% 98%  Weight:   68 kg (150 lb)   Height:   5\' 7"  (1.702 m)     Intake/Output Summary (Last 24 hours) at 12/17/15 1239 Last data filed at 12/17/15 1113  Gross per 24 hour  Intake                3 ml  Output             2333 ml  Net            -2330 ml     Wt Readings from Last 3 Encounters:  12/17/15 68 kg (150 lb)  12/13/15 75.4 kg (166 lb 4 oz)  11/29/15 74.4 kg (164 lb)     Exam  General: Alert and oriented x 3, NAD  HEENT:    Neck: Supple, +JVD  Cardiovascular: S1 S2 auscultated, no rubs, murmurs or gallops. Regular rate and rhythm.  Respiratory:Decreased breath sounds at the bases, bibasilar Rales   Gastrointestinal: Soft, nontender, nondistended, + bowel sounds  Ext: no cyanosis clubbing, 2+ edema  Neuro: AAOx3, Cr N's II- XII. Strength 5/5 upper and lower extremities bilaterally  Skin: No rashes  Psych: Normal affect and demeanor, alert and oriented x3    Data Reviewed:  I have personally reviewed following labs and imaging studies  Micro Results Recent Results (from the past 240 hour(s))  CULTURE, URINE COMPREHENSIVE     Status: None   Collection Time: 12/13/15  3:59 PM  Result Value Ref Range Status   Culture PSEUDOMONAS AERUGINOSA ENTEROCOCCUS SPECIES   Final   Colony Count 10,000-50,000 CFU/mL  Final   Colony Count 10,000-50,000 CFU/mL  Final   Organism ID, Bacteria PSEUDOMONAS AERUGINOSA  Final   Colony Count 10,000-50,000 CFU/mL  Final   Organism ID, Bacteria ENTEROCOCCUS SPECIES  Final      Susceptibility   Enterococcus species -  (no method available)    AMPICILLIN <=2 Sensitive     LEVOFLOXACIN 1 Sensitive     NITROFURANTOIN <=16 Sensitive     VANCOMYCIN 2 Sensitive     TETRACYCLINE  >=16 Resistant    Pseudomonas aeruginosa -  (  no method available)    PIP/TAZO 8 Sensitive     IMIPENEM >=16 Resistant     CEFTRIAXONE  Resistant     CEFTAZIDIME 4 Sensitive     CEFEPIME <=1 Sensitive     GENTAMICIN <=1 Sensitive     TOBRAMYCIN <=1 Sensitive     CIPROFLOXACIN <=0.25 Sensitive     LEVOFLOXACIN 1 Sensitive     Radiology Reports Dg Chest 2 View  Result Date: 12/16/2015 CLINICAL DATA:  Shortness of breath and congestion.  Pneumonia. EXAM: CHEST  2 VIEW COMPARISON:  Two-view chest x-ray 12/13/2015 FINDINGS: The heart is enlarged. Bilateral pleural effusions have increased. Pulmonary vascular congestion is present. Bibasilar airspace disease has increased as well. Atherosclerotic calcifications are present at the aortic arch. Degenerate changes are noted at both shoulders. The visualized soft tissues and bony thorax are unremarkable. IMPRESSION: 1. Cardiomegaly with increasing bilateral pleural effusions and bibasilar airspace disease. This is most concerning for congestive heart failure. 2. Increasing pulmonary vascular congestion. 3. Bibasilar airspace disease likely reflects atelectasis. Early infection is not excluded. 4. Atherosclerosis of the thoracic aorta. Electronically Signed   By: San Morelle M.D.   On: 12/16/2015 17:19   Dg Chest 2 View  Result Date: 12/13/2015 CLINICAL DATA:  Cough, chest congestion, shortness of breath, and chest pain for the past 2 days; history of hypertension diabetes. History of CHF, community-acquired pneumonia, colonic and prostate malignancy. Remote history of smoking. EXAM: CHEST  2 VIEW COMPARISON:  PA and lateral chest x-ray of November 23, 2015 FINDINGS: The lungs are adequately inflated. There is bibasilar atelectasis or infiltrate with small bilateral pleural effusions more conspicuous than on the previous study. The cardiac silhouette remains enlarged. The pulmonary vascularity is mildly engorged but less conspicuous than on the previous  study. There is calcification in the wall of the aortic arch. The observed bony thorax exhibits no acute abnormality. There is degenerative change of both shoulders. IMPRESSION: New bibasilar atelectasis or pneumonia is present. Persistent mild CHF with small bilateral pleural effusions. Followup PA and lateral chest X-ray is recommended in 3-4 weeks following trial of antibiotic therapy to ensure resolution and exclude underlying malignancy. Aortic atherosclerosis. Electronically Signed   By: David  Martinique M.D.   On: 12/13/2015 16:20   Dg Chest 2 View  Result Date: 11/23/2015 CLINICAL DATA:  Cough and weakness for 1 week. EXAM: CHEST  2 VIEW COMPARISON:  PA and lateral chest 09/29/2015 and 04/06/2015. FINDINGS: The patient has small bilateral pleural effusions. There is marked cardiomegaly and pulmonary vascular congestion. Aortic atherosclerosis is noted. IMPRESSION: Cardiomegaly and pulmonary vascular congestion with small bilateral pleural effusions. Atherosclerosis. Electronically Signed   By: Inge Rise M.D.   On: 11/23/2015 13:31  Ct Head Wo Contrast  Result Date: 11/23/2015 CLINICAL DATA:  Generalized weakness since yesterday. EXAM: CT HEAD WITHOUT CONTRAST TECHNIQUE: Contiguous axial images were obtained from the base of the skull through the vertex without intravenous contrast. COMPARISON:  03/08/2005 FINDINGS: There is no evidence for acute hemorrhage, hydrocephalus, mass lesion, or abnormal extra-axial fluid collection. No definite CT evidence for acute infarction. Diffuse loss of parenchymal volume is consistent with atrophy. Patchy low attenuation in the deep hemispheric and periventricular white matter is nonspecific, but likely reflects chronic microvascular ischemic demyelination. The visualized paranasal sinuses and mastoid air cells are clear. IMPRESSION: 1. Stable.  No acute intracranial abnormality. 2. Atrophy with chronic small vessel white matter ischemic demyelination.  Electronically Signed   By: Verda Cumins.D.  On: 11/23/2015 14:15   Lab Data:  CBC:  Recent Labs Lab 12/16/15 1532  WBC 3.7*  NEUTROABS 2.1  HGB 12.5*  HCT 36.9*  MCV 87.2  PLT [id number]   Basic Metabolic Panel:  Recent Labs Lab 12/16/15 1532 12/17/15 0520  NA 134* 138  K 3.9 3.7  CL 106 106  CO2 20* 24  GLUCOSE 91 74  BUN 16 14  CREATININE 1.31* 1.29*  CALCIUM 8.9 8.9   GFR: Estimated Creatinine Clearance: 35.6 mL/min (by C-G formula based on SCr of 1.29 mg/dL). Liver Function Tests:  Recent Labs Lab 12/16/15 1532  AST 45*  ALT 39  ALKPHOS 84  BILITOT 0.8  PROT 6.0*  ALBUMIN 3.4*   No results for input(s): LIPASE, AMYLASE in the last 168 hours. No results for input(s): AMMONIA in the last 168 hours. Coagulation Profile:  Recent Labs Lab 12/17/15 0121  INR 1.22   Cardiac Enzymes:  Recent Labs Lab 12/16/15 1945 12/17/15 0121 12/17/15 0520  TROPONINI 0.18* 0.20* 0.21*   BNP (last 3 results) No results for input(s): PROBNP in the last 8760 hours. HbA1C: No results for input(s): HGBA1C in the last 72 hours. CBG:  Recent Labs Lab 12/17/15 1125  GLUCAP 113*   Lipid Profile: No results for input(s): CHOL, HDL, LDLCALC, TRIG, CHOLHDL, LDLDIRECT in the last 72 hours. Thyroid Function Tests: No results for input(s): TSH, T4TOTAL, FREET4, T3FREE, THYROIDAB in the last 72 hours. Anemia Panel: No results for input(s): VITAMINB12, FOLATE, FERRITIN, TIBC, IRON, RETICCTPCT in the last 72 hours. Urine analysis:    Component Value Date/Time   COLORURINE YELLOW 12/13/2015 1559   APPEARANCEUR CLEAR 12/13/2015 1559   LABSPEC 1.010 12/13/2015 1559   PHURINE 6.0 12/13/2015 1559   GLUCOSEU NEGATIVE 12/13/2015 1559   HGBUR NEGATIVE 12/13/2015 1559   HGBUR large 08/03/2008 1258   BILIRUBINUR NEGATIVE 12/13/2015 1559   BILIRUBINUR neg 04/09/2014 1056   KETONESUR NEGATIVE 12/13/2015 1559   PROTEINUR NEGATIVE 11/23/2015 1300   UROBILINOGEN 0.2  12/13/2015 1559   NITRITE NEGATIVE 12/13/2015 1559   LEUKOCYTESUR TRACE (A) 12/13/2015 1559     AmeLie Hollars M.D. [facility] 12/17/2015, 12:39 PM  Pager: [phone] Between 7am to 7pm - call Pager - [phone]  After 7pm go to [email or web address] - password [email or web address]  Call night coverage person covering after 7pm

## 2015-12-17 NOTE — Progress Notes (Signed)
Patient arrived in the unit accompanied by NT via stretcher. Orientation to the unit given. Patient verbalizes understanding. 

## 2015-12-17 NOTE — Progress Notes (Signed)
Pharmacy Antibiotic Note  Russell [name] is a 82 y.o. male admitted on 12/16/2015 with pneumonia.  Pharmacy has been consulted for Levaquin dosing for UTI coverage from urine culture done on 8/15 with Pseudomonas and Enterococcus (both sens to Levaquin). This will provide respiratory coverage as well.   Plan: -Levaquin 750 mg IV q48h -Trend WBC, temp, renal function  -F/U infectious work-up  Height: 5\' 4"  (162.6 cm) Weight: 160 lb (72.6 kg) IBW/kg (Calculated) : 59.2  Temp (24hrs), Avg:98.2 F (36.8 C), Min:98.2 F (36.8 C), Max:98.2 F (36.8 C)   Recent Labs Lab 12/16/15 1532  WBC 3.7*  CREATININE 1.31*    Estimated Creatinine Clearance: 34.2 mL/min (by C-G formula based on SCr of 1.31 mg/dL).    No Known Allergies   Narda Bonds 12/17/2015 1:20 AM

## 2015-12-17 NOTE — Progress Notes (Signed)
RD was consulted for, and attempted, diet education.   Unfortunately, pt was unable to understand/comprehend what RD was saying in the slightest.   When RD tried to do diet recall, he would only say what he had at hospital. RD asked about specific foods at home. He did say he likes soup/bacon, but wouldn't talk about frequency. He could not understand that salt was in foods already w/o being added. RD tried just to talk about table salt. When RD stated that salt can lead to trouble breathing, he said "No, salt doesn't give me any problems"  He says he lives with his daughter "who loves her medicine" and "she dont cook"  RD repeatedly tried to explain concepts to patient, but he was unable to grasp. Complicating this further is the fact he is HOH, slightly confused and very difficult to understand as he would just mumble. Much of what he said couldn't be understood. Pt's dementia prohibits any diet education.   If feel he is below baseline, can re attempt once cognition improves.   If CHF diet is greatly desired/indicated in this patient, patients meals should be prepared by someone else.   Burtis Junes RD, LDN, CNSC Clinical Nutrition Pager: [phone] 12/17/2015 3:16 PM

## 2015-12-17 NOTE — H&P (Addendum)
History and Physical    Russell [name] [id number] DOB: 09/15/1923 DOA: 12/16/2015  PCP: Scarlette Calico, MD  CARDIOLOGY: Caryl Comes  Patient coming from: Home  Chief Complaint: Cough x one week, orthopnea  HPI: Russell [name] is a 82 y.o. gentleman with a history of dementia, HTN, HLD, DM, prostate and colon cancer who presents to the ED with several of his children for evaluation of persistent cough (refractory to antibiotics and anti-tussives), orthopnea, and PND.  The patient saw his PCP three days ago with similar complaints and was diagnosed with pneumonia as well as a UTI.  He was given a prescription for omnicef.  He has not improved.  He has significant lower extremity edema.  Shortness of breath is worse when lying flat.  He has had symptoms of PND.  The family does not check his weight daily and they do not follow any real dietary or fluid restrictions.    At baseline, he ambulates independently with a cane.  ED Course: Chest xray suggestive of pulmonary edema.  BNP greater than 2000.  First troponin 0.18.  He does not have an oxygen requirement.  He has been given lasix 80mg  IV x one.  Full strength aspirin also given.  Hospitalist asked to admit.  Of note, last echo in 2015 showed an EF of 42% with grade I diastolic dysfunction.  The patient has not been on lasix at home.  Family reports that the patient is followed by Dr. Caryl Comes of cardiology.  Also, outpatient urine culture from earlier this week growing pseudomonas and enterococcus species.  Review of Systems: As per HPI otherwise 10 point review of systems negative.    Past Medical History:  Diagnosis Date  . Anxiety associated with depression   . Colon cancer [facility])    s/p resection 12/06  . Dementia   . Diabetes mellitus    diet mgt (11`/12)  . FTT (failure to thrive)    admited 08/2008  . History of cystoscopy    uretheral stricutre, 2005- Dr.Humphreys  . Hyperlipidemia   . Hypertension   . Loss of hearing    being  fitted for hearing aids (11/12)  . Osteoarthritis    hands  . Pneumonia   . Prostate cancer ([place])    s/p XRT  . SBO (small bowel obstruction) ([place]) 03/08/2014  . Urolithiasis     Past Surgical History:  Procedure Laterality Date  . CATARACT EXTRACTION, BILATERAL    . HEMICOLECTOMY  12/06  . TRANSURETHRAL RESECTION OF PROSTATE    . xrt     ERT for prostate ca     reports that he quit smoking about 48 years ago. He has never used smokeless tobacco. He reports that he does not drink alcohol or use drugs.  He is a widower.  He has four adult children, three daughters present tonight.  His daughter [name] is the POA, though he lives with a different daughter.  No Known Allergies  Family History  Problem Relation Age of Onset  . Diabetes Daughter   . Cancer Neg Hx   . Early death Neg Hx   . Heart disease Neg Hx   . Hyperlipidemia Neg Hx   . Hypertension Neg Hx   . Kidney disease Neg Hx   . Stroke Neg Hx   . Heart attack Neg Hx      Prior to Admission medications   Medication Sig Start Date End Date Taking? Authorizing Provider  ALPRAZolam Duanne Moron) 0.5 MG tablet TAKE 1 TABLET  BY MOUTH THREE TIMES DAILY AS NEEDED FOR ANXIETY Patient taking differently: TAKE 1 TABLET EVERY MORNING 09/12/15  Yes Janith Lima, MD  aspirin 81 MG tablet Take 81 mg by mouth daily.     Yes Historical Provider, MD  Azelastine-Fluticasone (DYMISTA) 137-50 MCG/ACT SUSP Place 1 Act into the nose 2 (two) times daily. Patient taking differently: Place 1 spray into both nostrils 2 (two) times daily as needed (for allergies).  09/29/15  Yes Janith Lima, MD  B Complex Vitamins (VITAMIN-B COMPLEX PO) Take 1 tablet by mouth daily.    Yes Historical Provider, MD  cefdinir (OMNICEF) 300 MG capsule Take 1 capsule (300 mg total) by mouth 2 (two) times daily. 12/13/15 12/23/15 Yes Janith Lima, MD  Cyanocobalamin (VITAMIN B-12 PO) Take 1 tablet by mouth daily with breakfast.   Yes Historical Provider, MD    fesoterodine (TOVIAZ) 8 MG TB24 tablet Take 1 tablet (8 mg total) by mouth daily. 05/13/14  Yes Janith Lima, MD  Ginkgo Biloba (GINKGO PO) Take 1 capsule by mouth 2 (two) times daily.    Yes Historical Provider, MD  hydrochlorothiazide (HYDRODIURIL) 25 MG tablet Take one tablet (25 mg) by mouth once daily as needed  07/18/15  Yes Historical Provider, MD  HYDROcodone-acetaminophen (NORCO/VICODIN) 5-325 MG tablet Take 1 tablet by mouth every 6 (six) hours as needed for moderate pain. 09/29/15  Yes Janith Lima, MD  ibuprofen (ADVIL,MOTRIN) 200 MG tablet Take 200-400 mg by mouth every 6 (six) hours as needed for headache or mild pain.   Yes Historical Provider, MD  losartan (COZAAR) 50 MG tablet Take 1 tablet (50 mg total) by mouth daily. 09/07/15  Yes Deboraha Sprang, MD  Multiple Vitamin (MULTI-VITAMIN PO) Take 1 tablet by mouth daily.   Yes Historical Provider, MD  polyethylene glycol (MIRALAX / GLYCOLAX) packet Take 17 g by mouth daily. Patient taking differently: Take 17 g by mouth daily. [place] AND DRINK 11/29/15  Yes Janith Lima, MD  promethazine-dextromethorphan (PROMETHAZINE-DM) 6.25-15 MG/5ML syrup Take 5 mLs by mouth 4 (four) times daily as needed for cough. 12/13/15  Yes Janith Lima, MD  sertraline (ZOLOFT) 100 MG tablet TAKE 1 TABLET BY MOUTH DAILY Patient taking differently: Take 100 mg by mouth once daily 07/08/15  Yes Janith Lima, MD  simvastatin (ZOCOR) 80 MG tablet Take 40 mg by mouth daily at 6 PM.  07/16/15  Yes Historical Provider, MD    Physical Exam: Vitals:   12/17/15 0000 12/17/15 0015 12/17/15 0030 12/17/15 0045  BP: 139/79 143/70 144/80 136/77  Pulse: 77 75 75 76  Resp: 19 18 18 21   Temp:      TempSrc:      SpO2: 99% 99% 99% 98%  Weight:      Height:          Constitutional: NAD, calm, comfortable, very pleasant and cooperative with exam Vitals:   12/17/15 0000 12/17/15 0015 12/17/15 0030 12/17/15 0045  BP: 139/79 143/70 144/80 136/77  Pulse: 77 75 75 76   Resp: 19 18 18 21   Temp:      TempSrc:      SpO2: 99% 99% 99% 98%  Weight:      Height:       Eyes: PERRL, lids and conjunctivae normal ENMT: Mucous membranes are moist. Posterior pharynx clear of any exudate or lesions. Poor dentition.  Neck: normal appearance, supple Respiratory: Bilaeral rales.  No wheezing.  Normal respiratory effort. No accessory  muscle use.  Cardiovascular: Normal rate, regular rhythm.  2-3+ pitting edema in both legs.  2+ pedal pulses. No carotid bruits.  GI: abdomen is soft and compressible.  No distention.  No tenderness.  No masses palpated.  Bowel sounds are present. Musculoskeletal:  No joint deformity in upper and lower extremities. Good ROM, no contractures. Normal muscle tone.  Skin: no rashes, warm and dry Neurologic: CN 2-12 grossly intact. Sensation intact, Strength symmetric bilaterally, 5/5  Psychiatric: Impaired insight due to history of dementia. Awake and alert. Normal mood.     Labs on Admission: I have personally reviewed following labs and imaging studies  CBC:  Recent Labs Lab 12/16/15 1532  WBC 3.7*  NEUTROABS 2.1  HGB 12.5*  HCT 36.9*  MCV 87.2  PLT [id number]   Basic Metabolic Panel:  Recent Labs Lab 12/16/15 1532  NA 134*  K 3.9  CL 106  CO2 20*  GLUCOSE 91  BUN 16  CREATININE 1.31*  CALCIUM 8.9   GFR: Estimated Creatinine Clearance: 34.2 mL/min (by C-G formula based on SCr of 1.31 mg/dL). Liver Function Tests:  Recent Labs Lab 12/16/15 1532  AST 45*  ALT 39  ALKPHOS 84  BILITOT 0.8  PROT 6.0*  ALBUMIN 3.4*   Coagulation Profile:  Recent Labs Lab 12/17/15 0121  INR 1.22   Cardiac Enzymes:  Recent Labs Lab 12/16/15 1945 12/17/15 0121  TROPONINI 0.18* 0.20*   BNP (last 3 results) 2189  Urine analysis:    Component Value Date/Time   COLORURINE YELLOW 12/13/2015 [place] 12/13/2015 1559   LABSPEC 1.010 12/13/2015 1559   PHURINE 6.0 12/13/2015 1559   GLUCOSEU NEGATIVE  12/13/2015 1559   HGBUR NEGATIVE 12/13/2015 1559   HGBUR large 08/03/2008 1258   BILIRUBINUR NEGATIVE 12/13/2015 1559   BILIRUBINUR neg 04/09/2014 1056   KETONESUR NEGATIVE 12/13/2015 1559   PROTEINUR NEGATIVE 11/23/2015 1300   UROBILINOGEN 0.2 12/13/2015 1559   NITRITE NEGATIVE 12/13/2015 1559   LEUKOCYTESUR TRACE (A) 12/13/2015 1559    Radiological Exams on Admission: Dg Chest 2 View  Result Date: 12/16/2015 CLINICAL DATA:  Shortness of breath and congestion.  Pneumonia. EXAM: CHEST  2 VIEW COMPARISON:  Two-view chest x-ray 12/13/2015 FINDINGS: The heart is enlarged. Bilateral pleural effusions have increased. Pulmonary vascular congestion is present. Bibasilar airspace disease has increased as well. Atherosclerotic calcifications are present at the aortic arch. Degenerate changes are noted at both shoulders. The visualized soft tissues and bony thorax are unremarkable. IMPRESSION: 1. Cardiomegaly with increasing bilateral pleural effusions and bibasilar airspace disease. This is most concerning for congestive heart failure. 2. Increasing pulmonary vascular congestion. 3. Bibasilar airspace disease likely reflects atelectasis. Early infection is not excluded. 4. Atherosclerosis of the thoracic aorta. Electronically Signed   By: San Morelle M.D.   On: 12/16/2015 17:19    EKG: Pending.  Assessment/Plan Principal Problem:   Acute CHF (congestive heart failure) (HCC) Active Problems:   Cough   AKI (acute kidney injury) ([place])   Diabetes mellitus type 2, diet-controlled ([place])   UTI (lower urinary tract infection)   Acute CHF ([place])      Acute on chronic combined CHF --Admit to telemetry --EKG pending --Serial troponin.  Low index of suspicion for ACS; I suspect troponin leak is related to CHF at this point. --Diuresis with IV lasix --Daily weights --Strict I/O --heart failure pathway per focused order set --1200cc fluid restriction --Echo in the AM  AKI --Hold ARB,  HCTZ --Monitor with diuresis --Suspect  he has underlying CKD  DM --SSI coverage TID before meals --Check A1c --Nutrition consult per family request  UTI --Stop omnicef and switch to levaquin, pharmacy to dose, based on culture data and sensitivities  DVT prophylaxis: Lovenox Code Status: FULL Family Communication: Three daughters present in the ED at time of admission. Disposition Plan: To be determined.  Currently has home health services. Consults called: NONE.  Consider heart failure consult in the AM. Admission status: Inpatient, telemetry   TIME SPENT: 70 minutes   Eber Jones MD [facility] Pager [phone]  If 7PM-7AM, please contact night-coverage [email or web address] Password [facility]  12/17/2015, 3:06 AM   EKG reviewed.  Has evidence of LVH with repolarization abnormality but no acute ST segment elevations.  NSR.

## 2015-12-18 ENCOUNTER — Inpatient Hospital Stay (HOSPITAL_COMMUNITY): Payer: PPO

## 2015-12-18 DIAGNOSIS — N390 Urinary tract infection, site not specified: Secondary | ICD-10-CM

## 2015-12-18 DIAGNOSIS — I509 Heart failure, unspecified: Secondary | ICD-10-CM | POA: Diagnosis not present

## 2015-12-18 DIAGNOSIS — N179 Acute kidney failure, unspecified: Secondary | ICD-10-CM | POA: Diagnosis not present

## 2015-12-18 DIAGNOSIS — I5041 Acute combined systolic (congestive) and diastolic (congestive) heart failure: Secondary | ICD-10-CM | POA: Diagnosis not present

## 2015-12-18 DIAGNOSIS — R05 Cough: Secondary | ICD-10-CM | POA: Diagnosis not present

## 2015-12-18 DIAGNOSIS — E119 Type 2 diabetes mellitus without complications: Secondary | ICD-10-CM

## 2015-12-18 DIAGNOSIS — I5021 Acute systolic (congestive) heart failure: Secondary | ICD-10-CM | POA: Diagnosis not present

## 2015-12-18 LAB — ECHOCARDIOGRAM COMPLETE
Height: 67 in
Weight: 2491.2 [oz_av]

## 2015-12-18 LAB — BASIC METABOLIC PANEL
ANION GAP: 14 (ref 5–15)
BUN: 20 mg/dL (ref 6–20)
CHLORIDE: 102 mmol/L (ref 101–111)
CO2: 23 mmol/L (ref 22–32)
Calcium: 9.2 mg/dL (ref 8.9–10.3)
Creatinine, Ser: 1.68 mg/dL — ABNORMAL HIGH (ref 0.61–1.24)
GFR calc Af Amer: 40 mL/min — ABNORMAL LOW (ref >60)
GFR calc non Af Amer: 34 mL/min — ABNORMAL LOW (ref >60)
GLUCOSE: 89 mg/dL (ref 65–99)
POTASSIUM: 4.7 mmol/L (ref 3.5–5.1)
Sodium: 139 mmol/L (ref 135–145)

## 2015-12-18 LAB — HEMOGLOBIN A1C
HEMOGLOBIN A1C: 5.4 % (ref 4.8–5.6)
MEAN PLASMA GLUCOSE: 108 mg/dL

## 2015-12-18 LAB — GLUCOSE, CAPILLARY
GLUCOSE-CAPILLARY: 130 mg/dL — AB (ref 65–99)
GLUCOSE-CAPILLARY: 137 mg/dL — AB (ref 65–99)
GLUCOSE-CAPILLARY: 119 mg/dL — AB (ref 65–99)
Glucose-Capillary: 98 mg/dL (ref 65–99)

## 2015-12-18 LAB — MAGNESIUM: Magnesium: 1.7 mg/dL (ref 1.7–2.4)

## 2015-12-18 MED ORDER — FUROSEMIDE 10 MG/ML IJ SOLN: 40.0000 mg | Freq: Every day | INTRAMUSCULAR | Status: DC

## 2015-12-18 MED ORDER — ENOXAPARIN SODIUM 30 MG/0.3ML ~~LOC~~ SOLN
30.0000 mg | Freq: Every day | SUBCUTANEOUS | Status: DC
  Administered 2015-12-19 – 2015-12-22 (×4): 30 mg via SUBCUTANEOUS
  Filled 2015-12-18 (×4): qty 0.3

## 2015-12-18 MED ORDER — MAGNESIUM SULFATE 50 % IJ SOLN
2.0000 g | Freq: Once | INTRAVENOUS | Status: DC
  Filled 2015-12-18 (×2): qty 4

## 2015-12-18 MED ORDER — LEVOFLOXACIN 500 MG PO TABS
500.0000 mg | ORAL_TABLET | ORAL | Status: DC
  Administered 2015-12-19 – 2015-12-21 (×2): 500 mg via ORAL
  Filled 2015-12-18 (×2): qty 1

## 2015-12-18 MED ORDER — MAGNESIUM SULFATE 2 GM/50ML IV SOLN
2.0000 g | Freq: Once | INTRAVENOUS | Status: AC
  Administered 2015-12-18: 2 g via INTRAVENOUS
  Filled 2015-12-18: qty 50

## 2015-12-18 NOTE — Progress Notes (Signed)
Pharmacy Antibiotic Note  Russell [name] is a 82 y.o. male admitted on 12/16/2015 with pneumonia.  Pharmacy has been consulted for Levaquin dosing for UTI coverage from urine culture done on 8/15 with Pseudomonas and Enterococcus (both sens to Levaquin). This will provide respiratory coverage as well.   Pt with worsening kidney function and in need for Levaquin dose change.  Will also change to PO per protocol.  Plan: -Decrease Levaquin to 500 mg PO q48h (first dose tomorrow) -Trend WBC, temp, renal function  -F/U infectious work-up  Height: 5\' 7"  (170.2 cm) Weight: 155 lb 11.2 oz (70.6 kg) IBW/kg (Calculated) : 66.1  Temp (24hrs), Avg:98.1 F (36.7 C), Min:97.9 F (36.6 C), Max:98.4 F (36.9 C)   Recent Labs Lab 12/16/15 1532 12/17/15 0520 12/18/15 0250  WBC 3.7*  --   --   CREATININE 1.31* 1.29* 1.68*    Estimated Creatinine Clearance: 27.3 mL/min (by C-G formula based on SCr of 1.68 mg/dL).    No Known Allergies  Shereka Lafortune L. Nicole Kindred, PharmD Infectious Diseases Clinical Pharmacist Pager: [phone] 12/18/2015 1:28 PM

## 2015-12-18 NOTE — Progress Notes (Signed)
[facility]                                                                              Patient Demographics  Russell [name], is a 82 y.o. male, DOB - Nov 01, 1923, [id number]  Admit date - 12/16/2015   Admitting Physician Lily Kocher, MD  Outpatient Primary MD for the patient is Scarlette Calico, MD  Outpatient specialists:   LOS - 2  days    Chief Complaint  Patient presents with  . Pneumonia       Brief summary   Russell [name] is a 82 y.o. gentleman with a history of dementia, HTN, HLD, DM, prostate and colon cancer who presents to the ED with several of his children for evaluation of persistent cough (refractory to antibiotics and anti-tussives), orthopnea, and PND.  The patient saw his PCP three days ago with similar complaints and was diagnosed with pneumonia as well as a UTI.  He was given a prescription for omnicef.  He has not improved.  He has significant lower extremity edema.  Shortness of breath is worse when lying flat.  He has had symptoms of PND.  The family does not check his weight daily and they do not follow any real dietary or fluid restrictions.   At baseline, he ambulates independently with a cane. ED Course: Chest xray suggestive of pulmonary edema.  BNP greater than 2000.  First troponin 0.18.  He does not have an oxygen requirement.  He has been given lasix 80mg  IV x one.  Of note, last echo in 2015 showed an EF of 42% with grade I diastolic dysfunction.  The patient has not been on lasix at home.  Family reports that the patient is followed by Dr. Caryl Comes of cardiology. Also, outpatient urine culture from earlier this week growing pseudomonas and enterococcus species.  Assessment & Plan    Acute on chronic combined CHF - Patient presented with shortness of breath, chest x-ray suggestive of pulmonary edema, BNP 2189 - Serial troponins positive, however patient presented with acute on chronic CHF.  - Follow 2-D echo, if patient has wall motion  abnormalities or depressed EF, will consult cardiology - Patient on IV Lasix, negative balance of 2.9 L however creatinine trended up to 1.6 today, hold IV Lasix today. Patient has already received 40 mg of IV Lasix this morning. - Daily weight, serial I's and O's  Acute kidney injury -Improving, likely due to UTI, - Hold ARB, HCTZ, Lasix  Diabetes mellitus -Continue sliding scale insulin, hemoglobin A1c    enterococcus, pseudomonas UTI -Continue levofloxacin for 7 days  Code Status: Full CODE STATUS  DVT Prophylaxis:  Lovenox  Family Communication: Discussed in detail with the patient, all imaging results, lab results explained to the patient   Disposition Plan: PT evaluation  Time Spent in minutes   75minutes  Procedures:    Consultants:     Antimicrobials:   Levaquin 8/19   Medications  Scheduled Meds: . ALPRAZolam  0.5 mg Oral q morning - 10a  . aspirin EC  81 mg Oral Daily  . atorvastatin  40 mg Oral q1800  . enoxaparin (LOVENOX)  injection  40 mg Subcutaneous Daily  . fesoterodine  8 mg Oral Daily  . insulin aspart  0-9 Units Subcutaneous TID WC  . levofloxacin (LEVAQUIN) IV  750 mg Intravenous Q48H  . magnesium sulfate LVP 250-500 ml  2 g Intravenous Once  . multivitamin with minerals  1 tablet Oral Daily  . polyethylene glycol  17 g Oral Daily  . sertraline  100 mg Oral Daily  . sodium chloride flush  3 mL Intravenous Q12H   Continuous Infusions:  PRN Meds:.sodium chloride, acetaminophen, HYDROcodone-acetaminophen, ondansetron (ZOFRAN) IV, sodium chloride flush   Antibiotics   Anti-infectives    Start     Dose/Rate Route Frequency Ordered Stop   12/17/15 0130  levofloxacin (LEVAQUIN) IVPB 750 mg     750 mg 100 mL/hr over 90 Minutes Intravenous Every 48 hours 12/17/15 0125          Subjective:   Russell [name] was seen and examined today.  Appears to be slightly confused, fumbling with the TV remote. Denies any chest pain or shortness of  breath. Patient denies dizziness,  abdominal pain, N/V/D/C, new weakness, numbess, tingling. No acute events overnight.  No fevers and chills.  Objective:   Vitals:   12/17/15 0842 12/17/15 1205 12/17/15 2148 12/18/15 0453  BP: 134/64 139/72 134/72 135/67  Pulse: 78 73 79 84  Resp: 18 18 18 18   Temp: 98.2 F (36.8 C) 97.6 F (36.4 C) 98.4 F (36.9 C) 98.1 F (36.7 C)  TempSrc: Oral Oral Oral Oral  SpO2: 98% 98% 100% 97%  Weight: 68 kg (150 lb)   70.6 kg (155 lb 11.2 oz)  Height: 5\' 7"  (1.702 m)       Intake/Output Summary (Last 24 hours) at 12/18/15 1039 Last data filed at 12/18/15 0930  Gross per 24 hour  Intake              538 ml  Output             1200 ml  Net             -662 ml     Wt Readings from Last 3 Encounters:  12/18/15 70.6 kg (155 lb 11.2 oz)  12/13/15 75.4 kg (166 lb 4 oz)  11/29/15 74.4 kg (164 lb)     Exam  General: Alert and oriented x 3, NAD  HEENT:    Neck: Supple, +JVD  Cardiovascular: S1 S2 clear, RRR  Respiratory:Decreased breath sounds at the bases  Gastrointestinal: Soft, nontender, nondistended, + bowel sounds  Ext: no cyanosis clubbing, 1+ edema  Neuro: No new deficits  Skin: No rashes  Psych: Normal affect and demeanor, alert and oriented x3    Data Reviewed:  I have personally reviewed following labs and imaging studies  Micro Results Recent Results (from the past 240 hour(s))  CULTURE, URINE COMPREHENSIVE     Status: None   Collection Time: 12/13/15  3:59 PM  Result Value Ref Range Status   Culture PSEUDOMONAS AERUGINOSA ENTEROCOCCUS SPECIES   Final   Colony Count 10,000-50,000 CFU/mL  Final   Colony Count 10,000-50,000 CFU/mL  Final   Organism ID, Bacteria PSEUDOMONAS AERUGINOSA  Final   Colony Count 10,000-50,000 CFU/mL  Final   Organism ID, Bacteria ENTEROCOCCUS SPECIES  Final      Susceptibility   Enterococcus species -  (no method available)    AMPICILLIN <=2 Sensitive     LEVOFLOXACIN 1 Sensitive      NITROFURANTOIN <=16 Sensitive  VANCOMYCIN 2 Sensitive     TETRACYCLINE >=16 Resistant    Pseudomonas aeruginosa -  (no method available)    PIP/TAZO 8 Sensitive     IMIPENEM >=16 Resistant     CEFTRIAXONE  Resistant     CEFTAZIDIME 4 Sensitive     CEFEPIME <=1 Sensitive     GENTAMICIN <=1 Sensitive     TOBRAMYCIN <=1 Sensitive     CIPROFLOXACIN <=0.25 Sensitive     LEVOFLOXACIN 1 Sensitive     Radiology Reports Dg Chest 2 View  Result Date: 12/16/2015 CLINICAL DATA:  Shortness of breath and congestion.  Pneumonia. EXAM: CHEST  2 VIEW COMPARISON:  Two-view chest x-ray 12/13/2015 FINDINGS: The heart is enlarged. Bilateral pleural effusions have increased. Pulmonary vascular congestion is present. Bibasilar airspace disease has increased as well. Atherosclerotic calcifications are present at the aortic arch. Degenerate changes are noted at both shoulders. The visualized soft tissues and bony thorax are unremarkable. IMPRESSION: 1. Cardiomegaly with increasing bilateral pleural effusions and bibasilar airspace disease. This is most concerning for congestive heart failure. 2. Increasing pulmonary vascular congestion. 3. Bibasilar airspace disease likely reflects atelectasis. Early infection is not excluded. 4. Atherosclerosis of the thoracic aorta. Electronically Signed   By: San Morelle M.D.   On: 12/16/2015 17:19   Dg Chest 2 View  Result Date: 12/13/2015 CLINICAL DATA:  Cough, chest congestion, shortness of breath, and chest pain for the past 2 days; history of hypertension diabetes. History of CHF, community-acquired pneumonia, colonic and prostate malignancy. Remote history of smoking. EXAM: CHEST  2 VIEW COMPARISON:  PA and lateral chest x-ray of November 23, 2015 FINDINGS: The lungs are adequately inflated. There is bibasilar atelectasis or infiltrate with small bilateral pleural effusions more conspicuous than on the previous study. The cardiac silhouette remains enlarged. The  pulmonary vascularity is mildly engorged but less conspicuous than on the previous study. There is calcification in the wall of the aortic arch. The observed bony thorax exhibits no acute abnormality. There is degenerative change of both shoulders. IMPRESSION: New bibasilar atelectasis or pneumonia is present. Persistent mild CHF with small bilateral pleural effusions. Followup PA and lateral chest X-ray is recommended in 3-4 weeks following trial of antibiotic therapy to ensure resolution and exclude underlying malignancy. Aortic atherosclerosis. Electronically Signed   By: David  Martinique M.D.   On: 12/13/2015 16:20   Dg Chest 2 View  Result Date: 11/23/2015 CLINICAL DATA:  Cough and weakness for 1 week. EXAM: CHEST  2 VIEW COMPARISON:  PA and lateral chest 09/29/2015 and 04/06/2015. FINDINGS: The patient has small bilateral pleural effusions. There is marked cardiomegaly and pulmonary vascular congestion. Aortic atherosclerosis is noted. IMPRESSION: Cardiomegaly and pulmonary vascular congestion with small bilateral pleural effusions. Atherosclerosis. Electronically Signed   By: Inge Rise M.D.   On: 11/23/2015 13:31  Ct Head Wo Contrast  Result Date: 11/23/2015 CLINICAL DATA:  Generalized weakness since yesterday. EXAM: CT HEAD WITHOUT CONTRAST TECHNIQUE: Contiguous axial images were obtained from the base of the skull through the vertex without intravenous contrast. COMPARISON:  03/08/2005 FINDINGS: There is no evidence for acute hemorrhage, hydrocephalus, mass lesion, or abnormal extra-axial fluid collection. No definite CT evidence for acute infarction. Diffuse loss of parenchymal volume is consistent with atrophy. Patchy low attenuation in the deep hemispheric and periventricular white matter is nonspecific, but likely reflects chronic microvascular ischemic demyelination. The visualized paranasal sinuses and mastoid air cells are clear. IMPRESSION: 1. Stable.  No acute intracranial abnormality.  2. Atrophy with chronic  small vessel white matter ischemic demyelination. Electronically Signed   By: Misty Stanley M.D.   On: 11/23/2015 14:15   Lab Data:  CBC:  Recent Labs Lab 12/16/15 1532  WBC 3.7*  NEUTROABS 2.1  HGB 12.5*  HCT 36.9*  MCV 87.2  PLT [id number]   Basic Metabolic Panel:  Recent Labs Lab 12/16/15 1532 12/17/15 0520 12/17/15 1457 12/18/15 0250  NA 134* 138  --  139  K 3.9 3.7  --  4.7  CL 106 106  --  102  CO2 20* 24  --  23  GLUCOSE 91 74  --  89  BUN 16 14  --  20  CREATININE 1.31* 1.29*  --  1.68*  CALCIUM 8.9 8.9  --  9.2  MG  --   --  1.7 1.7   GFR: Estimated Creatinine Clearance: 27.3 mL/min (by C-G formula based on SCr of 1.68 mg/dL). Liver Function Tests:  Recent Labs Lab 12/16/15 1532  AST 45*  ALT 39  ALKPHOS 84  BILITOT 0.8  PROT 6.0*  ALBUMIN 3.4*   No results for input(s): LIPASE, AMYLASE in the last 168 hours. No results for input(s): AMMONIA in the last 168 hours. Coagulation Profile:  Recent Labs Lab 12/17/15 0121  INR 1.22   Cardiac Enzymes:  Recent Labs Lab 12/16/15 1945 12/17/15 0121 12/17/15 0520 12/17/15 1300  TROPONINI 0.18* 0.20* 0.21* 0.19*   BNP (last 3 results) No results for input(s): PROBNP in the last 8760 hours. HbA1C: No results for input(s): HGBA1C in the last 72 hours. CBG:  Recent Labs Lab 12/17/15 1125 12/17/15 1608 12/17/15 2146 12/18/15 0646  GLUCAP 113* 120* 132* 98   Lipid Profile: No results for input(s): CHOL, HDL, LDLCALC, TRIG, CHOLHDL, LDLDIRECT in the last 72 hours. Thyroid Function Tests: No results for input(s): TSH, T4TOTAL, FREET4, T3FREE, THYROIDAB in the last 72 hours. Anemia Panel: No results for input(s): VITAMINB12, FOLATE, FERRITIN, TIBC, IRON, RETICCTPCT in the last 72 hours. Urine analysis:    Component Value Date/Time   COLORURINE YELLOW 12/13/2015 1559   APPEARANCEUR CLEAR 12/13/2015 1559   LABSPEC 1.010 12/13/2015 1559   PHURINE 6.0 12/13/2015 1559    GLUCOSEU NEGATIVE 12/13/2015 1559   HGBUR NEGATIVE 12/13/2015 1559   HGBUR large 08/03/2008 1258   BILIRUBINUR NEGATIVE 12/13/2015 1559   BILIRUBINUR neg 04/09/2014 1056   KETONESUR NEGATIVE 12/13/2015 1559   PROTEINUR NEGATIVE 11/23/2015 1300   UROBILINOGEN 0.2 12/13/2015 1559   NITRITE NEGATIVE 12/13/2015 1559   LEUKOCYTESUR TRACE (A) 12/13/2015 1559     Kanai Berrios M.D. [facility] 12/18/2015, 10:39 AM  Pager: [phone] Between 7am to 7pm - call Pager - [phone]  After 7pm go to [email or web address] - password [email or web address]  Call night coverage person covering after 7pm

## 2015-12-18 NOTE — Progress Notes (Signed)
Echocardiogram 2D Echocardiogram has been performed.  Russell [name] 12/18/2015, 12:50 PM

## 2015-12-19 ENCOUNTER — Other Ambulatory Visit (HOSPITAL_COMMUNITY): Payer: Self-pay

## 2015-12-19 DIAGNOSIS — I5023 Acute on chronic systolic (congestive) heart failure: Secondary | ICD-10-CM

## 2015-12-19 DIAGNOSIS — I509 Heart failure, unspecified: Secondary | ICD-10-CM | POA: Diagnosis not present

## 2015-12-19 DIAGNOSIS — I5021 Acute systolic (congestive) heart failure: Secondary | ICD-10-CM | POA: Diagnosis not present

## 2015-12-19 DIAGNOSIS — R05 Cough: Secondary | ICD-10-CM | POA: Diagnosis not present

## 2015-12-19 DIAGNOSIS — I5041 Acute combined systolic (congestive) and diastolic (congestive) heart failure: Secondary | ICD-10-CM | POA: Diagnosis not present

## 2015-12-19 DIAGNOSIS — N179 Acute kidney failure, unspecified: Secondary | ICD-10-CM | POA: Diagnosis not present

## 2015-12-19 LAB — GLUCOSE, CAPILLARY
GLUCOSE-CAPILLARY: 100 mg/dL — AB (ref 65–99)
GLUCOSE-CAPILLARY: 99 mg/dL (ref 65–99)
GLUCOSE-CAPILLARY: 125 mg/dL — AB (ref 65–99)
Glucose-Capillary: 149 mg/dL — ABNORMAL HIGH (ref 65–99)

## 2015-12-19 LAB — BASIC METABOLIC PANEL
ANION GAP: 7 (ref 5–15)
BUN: 22 mg/dL — ABNORMAL HIGH (ref 6–20)
CHLORIDE: 105 mmol/L (ref 101–111)
CO2: 27 mmol/L (ref 22–32)
Calcium: 8.7 mg/dL — ABNORMAL LOW (ref 8.9–10.3)
Creatinine, Ser: 1.84 mg/dL — ABNORMAL HIGH (ref 0.61–1.24)
GFR calc non Af Amer: 31 mL/min — ABNORMAL LOW (ref >60)
GFR, EST AFRICAN AMERICAN: 36 mL/min — AB (ref >60)
Glucose, Bld: 99 mg/dL (ref 65–99)
POTASSIUM: 4 mmol/L (ref 3.5–5.1)
SODIUM: 139 mmol/L (ref 135–145)

## 2015-12-19 MED ORDER — ALPRAZOLAM 0.5 MG PO TABS
0.5000 mg | ORAL_TABLET | Freq: Every day | ORAL | Status: DC
  Administered 2015-12-19 – 2015-12-21 (×3): 0.5 mg via ORAL
  Filled 2015-12-19 (×3): qty 1

## 2015-12-19 MED ORDER — CARVEDILOL 3.125 MG PO TABS
3.1250 mg | ORAL_TABLET | Freq: Two times a day (BID) | ORAL | Status: DC
  Administered 2015-12-19: 3.125 mg via ORAL
  Filled 2015-12-19: qty 1

## 2015-12-19 MED ORDER — FUROSEMIDE 20 MG PO TABS
20.0000 mg | ORAL_TABLET | Freq: Every day | ORAL | Status: DC
  Administered 2015-12-19 – 2015-12-22 (×4): 20 mg via ORAL
  Filled 2015-12-19 (×4): qty 1

## 2015-12-19 NOTE — Progress Notes (Signed)
[facility]                                                                              Patient Demographics  Russell [name], is a 82 y.o. male, DOB - 07/30/1923, [id number]  Admit date - 12/16/2015   Admitting Physician Lily Kocher, MD  Outpatient Primary MD for the patient is Scarlette Calico, MD  Outpatient specialists:   LOS - 3  days    Chief Complaint  Patient presents with  . Pneumonia       Brief summary   Russell [name] is a 82 y.o. gentleman with a history of dementia, HTN, HLD, DM, prostate and colon cancer who presents to the ED with several of his children for evaluation of persistent cough (refractory to antibiotics and anti-tussives), orthopnea, and PND.  The patient saw his PCP three days ago with similar complaints and was diagnosed with pneumonia as well as a UTI.  He was given a prescription for omnicef.  He has not improved.  He has significant lower extremity edema.  Shortness of breath is worse when lying flat.  He has had symptoms of PND.  The family does not check his weight daily and they do not follow any real dietary or fluid restrictions.   At baseline, he ambulates independently with a cane. ED Course: Chest xray suggestive of pulmonary edema.  BNP greater than 2000.  First troponin 0.18.  He does not have an oxygen requirement.  He has been given lasix 80mg  IV x one.  Of note, last echo in 2015 showed an EF of 42% with grade I diastolic dysfunction.  The patient has not been on lasix at home.  Family reports that the patient is followed by Dr. Caryl Comes of cardiology. Also, outpatient urine culture from earlier this week growing pseudomonas and enterococcus species.  Assessment & Plan    Acute on chronic combined CHF - Patient presented with shortness of breath, chest x-ray suggestive of pulmonary edema, BNP 2189 - Serial troponins positive, however patient presented with acute on chronic CHF.  - Patient on IV Lasix, negative balance of 2.9  L however creatinine trended up to 1.6, Hence IV Lasix held. -Negative balance of 4.2 L, weight down from 160-> 155lbs  - 2-D echo showed EF of 20% with diffuse hypokinesis, cardiology consulted - creatinine continues to trend up to 1.8, continue to hold Lasix  Acute kidney injury -Improving, likely due to UTI, - Hold ARB, HCTZ, Lasix  Diabetes mellitus -Continue sliding scale insulin, CBGs controlled - hemoglobin A1c 5.4   enterococcus, pseudomonas UTI -Continue levofloxacin for 7 days  Anxiety/insomnia - Continue Xanax, patient takes it twice a day and third time if needed in the evening  Code Status: Full CODE STATUS  DVT Prophylaxis:  Lovenox  Family Communication: Discussed in detail with the patient, all imaging results, lab results explained to the patient and daughters at the bedside  Disposition Plan: PT evaluation  Time Spent in minutes   61minutes  Procedures:    Consultants:     Antimicrobials:   Levaquin 8/19   Medications  Scheduled Meds: . ALPRAZolam  0.5 mg  Oral q morning - 10a  . ALPRAZolam  0.5 mg Oral QHS  . aspirin EC  81 mg Oral Daily  . atorvastatin  40 mg Oral q1800  . enoxaparin (LOVENOX) injection  30 mg Subcutaneous Daily  . fesoterodine  8 mg Oral Daily  . insulin aspart  0-9 Units Subcutaneous TID WC  . levofloxacin  500 mg Oral Q48H  . multivitamin with minerals  1 tablet Oral Daily  . polyethylene glycol  17 g Oral Daily  . sertraline  100 mg Oral Daily  . sodium chloride flush  3 mL Intravenous Q12H   Continuous Infusions:  PRN Meds:.sodium chloride, acetaminophen, HYDROcodone-acetaminophen, ondansetron (ZOFRAN) IV, sodium chloride flush   Antibiotics   Anti-infectives    Start     Dose/Rate Route Frequency Ordered Stop   12/19/15 0130  levofloxacin (LEVAQUIN) tablet 500 mg     500 mg Oral Every 48 hours 12/18/15 1327     12/17/15 0130  levofloxacin (LEVAQUIN) IVPB 750 mg  Status:  Discontinued     750 mg 100 mL/hr  over 90 Minutes Intravenous Every 48 hours 12/17/15 0125 12/18/15 1327        Subjective:   Russell [name] was seen and examined today.  Somewhat upset today, did not have good night sleep.2 daughters at the bedside. Denies any chest pain or shortness of breath. Patient denies dizziness,  abdominal pain, N/V/D/C, new weakness, numbess, tingling. No acute events overnight.  No fevers and chills.  Objective:   Vitals:   12/18/15 1138 12/18/15 2151 12/19/15 0532 12/19/15 1008  BP: 117/63 (!) 141/63 135/72 115/61  Pulse: 85 91 72 68  Resp: 18 18 18    Temp: 97.9 F (36.6 C) 98.2 F (36.8 C) 98.4 F (36.9 C)   TempSrc: Oral Oral Oral   SpO2: 94% 98% 100%   Weight:   70.3 kg (155 lb)   Height:        Intake/Output Summary (Last 24 hours) at 12/19/15 1211 Last data filed at 12/19/15 0957  Gross per 24 hour  Intake           568.67 ml  Output              901 ml  Net          -332.33 ml     Wt Readings from Last 3 Encounters:  12/19/15 70.3 kg (155 lb)  12/13/15 75.4 kg (166 lb 4 oz)  11/29/15 74.4 kg (164 lb)     Exam  General: Alert and oriented x 3, NAD  HEENT:    Neck: Supple,   Cardiovascular: S1 S2 clear, RRR  Respiratory:Decreased breath sounds at the bases  Gastrointestinal: Soft, nontender, nondistended, + bowel sounds  Ext: no cyanosis clubbing, 1+ edema  Neuro: No new deficits  Skin: No rashes  Psych: Normal affect and demeanor, alert and oriented x3    Data Reviewed:  I have personally reviewed following labs and imaging studies  Micro Results Recent Results (from the past 240 hour(s))  CULTURE, URINE COMPREHENSIVE     Status: None   Collection Time: 12/13/15  3:59 PM  Result Value Ref Range Status   Culture PSEUDOMONAS AERUGINOSA ENTEROCOCCUS SPECIES   Final   Colony Count 10,000-50,000 CFU/mL  Final   Colony Count 10,000-50,000 CFU/mL  Final   Organism ID, Bacteria PSEUDOMONAS AERUGINOSA  Final   Colony Count 10,000-50,000 CFU/mL   Final   Organism ID, Bacteria ENTEROCOCCUS SPECIES  Final  Susceptibility   Enterococcus species -  (no method available)    AMPICILLIN <=2 Sensitive     LEVOFLOXACIN 1 Sensitive     NITROFURANTOIN <=16 Sensitive     VANCOMYCIN 2 Sensitive     TETRACYCLINE >=16 Resistant    Pseudomonas aeruginosa -  (no method available)    PIP/TAZO 8 Sensitive     IMIPENEM >=16 Resistant     CEFTRIAXONE  Resistant     CEFTAZIDIME 4 Sensitive     CEFEPIME <=1 Sensitive     GENTAMICIN <=1 Sensitive     TOBRAMYCIN <=1 Sensitive     CIPROFLOXACIN <=0.25 Sensitive     LEVOFLOXACIN 1 Sensitive     Radiology Reports Dg Chest 2 View  Result Date: 12/16/2015 CLINICAL DATA:  Shortness of breath and congestion.  Pneumonia. EXAM: CHEST  2 VIEW COMPARISON:  Two-view chest x-ray 12/13/2015 FINDINGS: The heart is enlarged. Bilateral pleural effusions have increased. Pulmonary vascular congestion is present. Bibasilar airspace disease has increased as well. Atherosclerotic calcifications are present at the aortic arch. Degenerate changes are noted at both shoulders. The visualized soft tissues and bony thorax are unremarkable. IMPRESSION: 1. Cardiomegaly with increasing bilateral pleural effusions and bibasilar airspace disease. This is most concerning for congestive heart failure. 2. Increasing pulmonary vascular congestion. 3. Bibasilar airspace disease likely reflects atelectasis. Early infection is not excluded. 4. Atherosclerosis of the thoracic aorta. Electronically Signed   By: San Morelle M.D.   On: 12/16/2015 17:19   Dg Chest 2 View  Result Date: 12/13/2015 CLINICAL DATA:  Cough, chest congestion, shortness of breath, and chest pain for the past 2 days; history of hypertension diabetes. History of CHF, community-acquired pneumonia, colonic and prostate malignancy. Remote history of smoking. EXAM: CHEST  2 VIEW COMPARISON:  PA and lateral chest x-ray of November 23, 2015 FINDINGS: The lungs are  adequately inflated. There is bibasilar atelectasis or infiltrate with small bilateral pleural effusions more conspicuous than on the previous study. The cardiac silhouette remains enlarged. The pulmonary vascularity is mildly engorged but less conspicuous than on the previous study. There is calcification in the wall of the aortic arch. The observed bony thorax exhibits no acute abnormality. There is degenerative change of both shoulders. IMPRESSION: New bibasilar atelectasis or pneumonia is present. Persistent mild CHF with small bilateral pleural effusions. Followup PA and lateral chest X-ray is recommended in 3-4 weeks following trial of antibiotic therapy to ensure resolution and exclude underlying malignancy. Aortic atherosclerosis. Electronically Signed   By: David  Martinique M.D.   On: 12/13/2015 16:20   Dg Chest 2 View  Result Date: 11/23/2015 CLINICAL DATA:  Cough and weakness for 1 week. EXAM: CHEST  2 VIEW COMPARISON:  PA and lateral chest 09/29/2015 and 04/06/2015. FINDINGS: The patient has small bilateral pleural effusions. There is marked cardiomegaly and pulmonary vascular congestion. Aortic atherosclerosis is noted. IMPRESSION: Cardiomegaly and pulmonary vascular congestion with small bilateral pleural effusions. Atherosclerosis. Electronically Signed   By: Inge Rise M.D.   On: 11/23/2015 13:31  Ct Head Wo Contrast  Result Date: 11/23/2015 CLINICAL DATA:  Generalized weakness since yesterday. EXAM: CT HEAD WITHOUT CONTRAST TECHNIQUE: Contiguous axial images were obtained from the base of the skull through the vertex without intravenous contrast. COMPARISON:  03/08/2005 FINDINGS: There is no evidence for acute hemorrhage, hydrocephalus, mass lesion, or abnormal extra-axial fluid collection. No definite CT evidence for acute infarction. Diffuse loss of parenchymal volume is consistent with atrophy. Patchy low attenuation in the deep hemispheric and periventricular  white matter is  nonspecific, but likely reflects chronic microvascular ischemic demyelination. The visualized paranasal sinuses and mastoid air cells are clear. IMPRESSION: 1. Stable.  No acute intracranial abnormality. 2. Atrophy with chronic small vessel white matter ischemic demyelination. Electronically Signed   By: Misty Stanley M.D.   On: 11/23/2015 14:15   Lab Data:  CBC:  Recent Labs Lab 12/16/15 1532  WBC 3.7*  NEUTROABS 2.1  HGB 12.5*  HCT 36.9*  MCV 87.2  PLT [id number]   Basic Metabolic Panel:  Recent Labs Lab 12/16/15 1532 12/17/15 0520 12/17/15 1457 12/18/15 0250 12/19/15 0553  NA 134* 138  --  139 139  K 3.9 3.7  --  4.7 4.0  CL 106 106  --  102 105  CO2 20* 24  --  23 27  GLUCOSE 91 74  --  89 99  BUN 16 14  --  20 22*  CREATININE 1.31* 1.29*  --  1.68* 1.84*  CALCIUM 8.9 8.9  --  9.2 8.7*  MG  --   --  1.7 1.7  --    GFR: Estimated Creatinine Clearance: 24.9 mL/min (by C-G formula based on SCr of 1.84 mg/dL). Liver Function Tests:  Recent Labs Lab 12/16/15 1532  AST 45*  ALT 39  ALKPHOS 84  BILITOT 0.8  PROT 6.0*  ALBUMIN 3.4*   No results for input(s): LIPASE, AMYLASE in the last 168 hours. No results for input(s): AMMONIA in the last 168 hours. Coagulation Profile:  Recent Labs Lab 12/17/15 0121  INR 1.22   Cardiac Enzymes:  Recent Labs Lab 12/16/15 1945 12/17/15 0121 12/17/15 0520 12/17/15 1300  TROPONINI 0.18* 0.20* 0.21* 0.19*   BNP (last 3 results) No results for input(s): PROBNP in the last 8760 hours. HbA1C:  Recent Labs  12/17/15 0121  HGBA1C 5.4   CBG:  Recent Labs Lab 12/18/15 0646 12/18/15 1141 12/18/15 1609 12/18/15 2201 12/19/15 0654  GLUCAP 98 130* 137* 119* 100*   Lipid Profile: No results for input(s): CHOL, HDL, LDLCALC, TRIG, CHOLHDL, LDLDIRECT in the last 72 hours. Thyroid Function Tests: No results for input(s): TSH, T4TOTAL, FREET4, T3FREE, THYROIDAB in the last 72 hours. Anemia Panel: No results for  input(s): VITAMINB12, FOLATE, FERRITIN, TIBC, IRON, RETICCTPCT in the last 72 hours. Urine analysis:    Component Value Date/Time   COLORURINE YELLOW 12/13/2015 1559   APPEARANCEUR CLEAR 12/13/2015 1559   LABSPEC 1.010 12/13/2015 1559   PHURINE 6.0 12/13/2015 1559   GLUCOSEU NEGATIVE 12/13/2015 1559   HGBUR NEGATIVE 12/13/2015 1559   HGBUR large 08/03/2008 1258   BILIRUBINUR NEGATIVE 12/13/2015 1559   BILIRUBINUR neg 04/09/2014 1056   KETONESUR NEGATIVE 12/13/2015 1559   PROTEINUR NEGATIVE 11/23/2015 1300   UROBILINOGEN 0.2 12/13/2015 1559   NITRITE NEGATIVE 12/13/2015 1559   LEUKOCYTESUR TRACE (A) 12/13/2015 1559     Russell [name] M.D. [facility] 12/19/2015, 12:11 PM  Pager: [phone] Between 7am to 7pm - call Pager - [phone]  After 7pm go to [email or web address] - password [email or web address]  Call night coverage person covering after 7pm

## 2015-12-19 NOTE — Care Management Note (Signed)
Case Management Note  Patient Details  Name: Russell [name] MRN: [id number] Date of Birth: 06-Mar-1924  Subjective/Objective:    Admitted with CHF                Action/Plan: CM talked to patient's daughter Elisabeth Pigeon [phone]) concerning DCP; patient lives at home with his disabled daughter; [name] lives close by stated that she is the primary caregiver for the two of them; He has a personal care service that provides assistance 4 days a week for 2-3 hrs ; He has Biochem[employer], clinical. Awaiting on PT/ OT eval for disposition home vs short term SNF. Patient' daughter had no concerns or complaints about HHC or his PCS. CM will continue to follow for DCP; [name] was instructed to bring in her HCPOA papers to the hospital so that a copy will be placed on the shadow chart.  Expected Discharge Date:   possibly 12/23/2015               Expected Discharge Plan:  Skilled Nursing Facility  In-House Referral:   [facility]  Discharge planning Services  CM Consult  Post Acute Care Choice:    Choice offered to:  [facility] POA / Guardian  Status of Service:  In process, will continue to follow  Royston Bake, RN 12/19/2015, 10:20 AM

## 2015-12-19 NOTE — Consult Note (Signed)
   Russell [name]   12/19/2015  Russell [name] 1923/05/11 [id number]   Patient screened for potential [facility] services with his [employer] plan.  Patient is eligible for [facility]. Electronic medical record reveals patient's discharge plan is for possible skilled facility.  Notes reveals the patientis a 82 y.o.gentleman with a history of dementia, HTN, DM, prostate and colon cancer who presents to the ED with several of his children for evaluation of persistent cough (refractory to antibiotics and anti-tussives), orthopnea, and pneumonia. The patient saw his primary care provider three days ago with similar complaints and was diagnosed with pneumonia as well as a UTI. He was given a prescription for omnicef. He has not improved. He has significant lower extremity edema. Shortness of breath is worse when lying flat. The family does not check his weight daily and they do not follow any real dietary or fluid restrictions. Notes reviewed from inpatient RNCM and daughter is the primary caregiver and patient has some personal care services as well. Will follow for disposition and needs as appropriate. For questions please contact:   Natividad Brood, RN BSN [facility] Liaison  [phone] business mobile phone Toll free office [phone]

## 2015-12-19 NOTE — Evaluation (Signed)
Physical Therapy Evaluation Patient Details Name: Russell [name] MRN: [id number] DOB: 09/13/1923 Today's Date: 12/19/2015   History of Present Illness  82 y.o. male admitted with acute on chronic CHF. PMH: dementia, HTN, HLD, DM, prostate and colon cancer.   Clinical Impression  Pt requiring physical assistance with mobility and limited ambulation tolerance and stability at this time. Based upon the patient's current mobility, recommending SNF for further rehabilitation following acute stay. PT to continue to follow and to maximize independence and general mobility as tolerated.     Follow Up Recommendations SNF;Supervision/Assistance - 24 hour    Equipment Recommendations   (to be addressed at next venue)    Recommendations for Other Services       Precautions / Restrictions Precautions Precautions: Fall Restrictions Weight Bearing Restrictions: No      Mobility  Bed Mobility Overal bed mobility: Needs Assistance Bed Mobility: Supine to Sit     Supine to sit: Mod assist     General bed mobility comments: at trunk and with LEs, cues needed for sequence and hand placement for pt.   Transfers Overall transfer level: Needs assistance Equipment used: Rolling walker (2 wheeled) Transfers: Sit to/from Stand Sit to Stand: Mod assist         General transfer comment: verbal and tactile cues needed for sequence.   Ambulation/Gait Ambulation/Gait assistance: Min guard Ambulation Distance (Feet): 20 Feet Assistive device: Rolling walker (2 wheeled) Gait Pattern/deviations: Shuffle;Decreased step length - left;Decreased step length - right;Trunk flexed Gait velocity: very slow pattern   General Gait Details: Cues needed for staying close to rw as well as physical assist to maneuver rw around objects in room.   Stairs            Wheelchair Mobility    Modified Rankin (Stroke Patients Only)       Balance Overall balance assessment: Needs  assistance Sitting-balance support: No upper extremity supported Sitting balance-Leahy Scale: Fair     Standing balance support: Bilateral upper extremity supported Standing balance-Leahy Scale: Poor Standing balance comment: using rw                             Pertinent Vitals/Pain Pain Assessment: No/denies pain    Home Living Family/patient expects to be discharged to:: Private residence Living Arrangements: Children Available Help at Discharge: Personal care attendant;Family Type of Home: House           Additional Comments: Pt not responding to questions regarding his home. He does confirm that he has family around and care aides.     Prior Function Level of Independence: Independent with assistive device(s)         Comments: Pt reports using a cane for ambulation.      Hand Dominance        Extremity/Trunk Assessment   Upper Extremity Assessment: Generalized weakness           Lower Extremity Assessment: Generalized weakness (limited knee extension bilaterally)         Communication   Communication: HOH  Cognition Arousal/Alertness: Lethargic Behavior During Therapy: Flat affect Overall Cognitive Status: History of cognitive impairments - at baseline (no family present)   Orientation Level: Disoriented to;Place;Situation;Time   Memory: Decreased short-term memory Following Commands: Follows one step commands inconsistently Safety/Judgement: Decreased awareness of safety          General Comments General comments (skin integrity, edema, etc.): Pt initially drowsy but becoming more alert with  mobilization. SpO2 94% or greater during session.      Exercises        Assessment/Plan    PT Assessment Patient needs continued PT services  PT Diagnosis Difficulty walking;Generalized weakness   PT Problem List Decreased strength;Decreased range of motion;Decreased activity tolerance;Decreased balance;Decreased mobility  PT  Treatment Interventions DME instruction;Gait training;Functional mobility training;Therapeutic activities;Therapeutic exercise;Balance training;Patient/family education   PT Goals (Current goals can be found in the Care Plan section) Acute Rehab PT Goals Patient Stated Goal: not expressed PT Goal Formulation: With patient Time For Goal Achievement: 01/02/16 Potential to Achieve Goals: Fair    Frequency Min 2X/week   Barriers to discharge        Co-evaluation               End of Session Equipment Utilized During Treatment: Gait belt;Oxygen Activity Tolerance: Patient tolerated treatment well Patient left: in chair;with call bell/phone within reach;with chair alarm set Nurse Communication: Mobility status         Time: [id number] PT Time Calculation (min) (ACUTE ONLY): 23 min   Charges:   PT Evaluation $PT Eval Moderate Complexity: 1 Procedure PT Treatments $Gait Training: 8-22 mins   PT G Codes:        Cassell Clement, PT, CSCS Pager [phone] Office [phone]  12/19/2015, 2:12 PM

## 2015-12-19 NOTE — Consult Note (Signed)
Cardiology Consult    Patient ID: Russell [name] MRN: [id number], DOB/AGE: January 06, 1924   Admit date: 12/16/2015 Date of Consult: 12/19/2015  Primary Physician: Scarlette Calico, MD Primary Cardiologist: Dr. Caryl Comes Requesting Provider: Dr. Tana Coast Reason for Consultation: Decreased EF  Patient Profile    82 male with PMH of dementia, HTN, HLD, DM, combined CHF, prostate and colon CA who presented to the [facility] ED persistent cough, orthopnea and PND.   Past Medical History   Past Medical History:  Diagnosis Date  . Anxiety associated with depression   . Colon cancer [facility])    s/p resection 12/06  . Dementia   . Diabetes mellitus    diet mgt (11`/12)  . FTT (failure to thrive)    admited 08/2008  . History of cystoscopy    uretheral stricutre, 2005- Dr.Humphreys  . Hyperlipidemia   . Hypertension   . Loss of hearing    being fitted for hearing aids (11/12)  . Osteoarthritis    hands  . Pneumonia   . Prostate cancer ([place])    s/p XRT  . SBO (small bowel obstruction) ([place]) 03/08/2014  . Urolithiasis     Past Surgical History:  Procedure Laterality Date  . CATARACT EXTRACTION, BILATERAL    . HEMICOLECTOMY  12/06  . TRANSURETHRAL RESECTION OF PROSTATE    . xrt     ERT for prostate ca     Allergies  No Known Allergies  History of Present Illness    [name] is a 82 male patient of Dr. Olin Pia with PMH of dementia, HTN, HLD, DM, combined CHF, prostate and colon CA. He was last seen in the office by Dr. Caryl Comes on 09/07/2015 where he presented as a hospital follow up after a syncopal episode felt to be related to UTI and dehydration. At this visit his losartan was decreased from 100 to 50, and his lasix cut back to PRN dosing given his blood pressure was soft and hx of orthostatic intolerance. His last echo on 9/15 showed an EF of 42% with G1DD, moderate AI, mild to moderate MR and marked dilated LV.  He presented to the [facility] ED on 12/17/2015 with reports of persistent  cough, orthopnea and PND. Previously saw his PCP 3 days prior with similar complaints and was dx with PNA and UTI, placed on antibiotics. Family reported no improvement, and worsening PND, with orthopnea. Of note the patient is a poor historian, and majority of information has been obtained from the chart.   In the ED his labs were significant for BNP 2189, trop cycled with mild elevation and flat trend, Hgb 12.5, Cr with mild elevated. Chest xray with bilateral pleural effusions and increased vascular congestion. He was given IV lasix with UOP, and admitted to Internal Medicine for further work up.   Inpatient Medications    . ALPRAZolam  0.5 mg Oral q morning - 10a  . ALPRAZolam  0.5 mg Oral QHS  . aspirin EC  81 mg Oral Daily  . atorvastatin  40 mg Oral q1800  . enoxaparin (LOVENOX) injection  30 mg Subcutaneous Daily  . fesoterodine  8 mg Oral Daily  . insulin aspart  0-9 Units Subcutaneous TID WC  . levofloxacin  500 mg Oral Q48H  . multivitamin with minerals  1 tablet Oral Daily  . polyethylene glycol  17 g Oral Daily  . sertraline  100 mg Oral Daily  . sodium chloride flush  3 mL Intravenous Q12H    Family  History    Family History  Problem Relation Age of Onset  . Diabetes Daughter   . Cancer Neg Hx   . Early death Neg Hx   . Heart disease Neg Hx   . Hyperlipidemia Neg Hx   . Hypertension Neg Hx   . Kidney disease Neg Hx   . Stroke Neg Hx   . Heart attack Neg Hx     Social History    Social History   Social History  . Marital status: Widowed    Spouse name: N/A  . Number of children: N/A  . Years of education: N/A   Occupational History  . Not on file.   Social History Main Topics  . Smoking status: Former Smoker    Quit date: 03/20/1967  . Smokeless tobacco: Never Used  . Alcohol use No     Comment: quit '68  . Drug use: No  . Sexual activity: Not Currently   Other Topics Concern  . Not on file   Social History Narrative   7th grade education.  Married '51 - 9/09. 3 daughters- '51, '53, '66; 1 son- '54. Lives w/ daughter Russell [name] who has schizophrenia since age 73. 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  Cardiovascular:  See HPI Dermatological: No rash, lesions/masses Respiratory: See HPI Urologic: No hematuria, dysuria Abdominal:   No nausea, vomiting, diarrhea, bright red blood per rectum, melena, or hematemesis Neurologic:  No visual changes, wkns, changes in mental status. All other systems reviewed and are otherwise negative except as noted above.  Physical Exam    Blood pressure 115/61, pulse 68, temperature 98.4 F (36.9 C), temperature source Oral, resp. rate 18, height 5\' 7"  (1.702 m), weight 155 lb (70.3 kg), SpO2 100 %.  General: Pleasant older AA male, NAD Psych: Normal affect. Neuro: Alert and oriented to self and place. Moves all extremities spontaneously. HEENT: Normal  Neck: Supple without bruits or JVD. Lungs:  Resp regular and unlabored, Diminished bilaterally. Heart: RRR no s3, s4, 2/6 systolic murmur. Abdomen: Soft, non-tender, non-distended, BS + x 4.  Extremities: No clubbing, cyanosis, trace bilateral lower extremity edema. DP/PT/Radials 2+ and equal bilaterally.  Labs    Troponin (Point of Care Test) No results for input(s): TROPIPOC in the last 72 hours.  Recent Labs  12/16/15 1945 12/17/15 0121 12/17/15 0520 12/17/15 1300  TROPONINI 0.18* 0.20* 0.21* 0.19*   Lab Results  Component Value Date   WBC 3.7 (L) 12/16/2015   HGB 12.5 (L) 12/16/2015   HCT 36.9 (L) 12/16/2015   MCV 87.2 12/16/2015   PLT 214 12/16/2015    Recent Labs Lab 12/16/15 1532  12/19/15 0553  NA 134*  < > 139  K 3.9  < > 4.0  CL 106  < > 105  CO2 20*  < > 27  BUN 16  < > 22*  CREATININE 1.31*  < > 1.84*  CALCIUM 8.9  < > 8.7*  PROT 6.0*  --   --   BILITOT 0.8  --   --   ALKPHOS 84  --   --   ALT 39  --   --     AST 45*  --   --   GLUCOSE 91  < > 99  < > = values in this interval not displayed. Lab Results  Component Value Date   CHOL 124 01/25/2015   HDL 46.50 01/25/2015   LDLCALC 63 01/25/2015   TRIG 74.0 01/25/2015   No results found for: [facility]   Radiology Studies    Dg Chest 2 View  Result Date: 12/16/2015 CLINICAL DATA:  Shortness of breath and congestion.  Pneumonia. EXAM: CHEST  2 VIEW COMPARISON:  Two-view chest x-ray 12/13/2015 FINDINGS: The heart is enlarged. Bilateral pleural effusions have increased. Pulmonary vascular congestion is present. Bibasilar airspace disease has increased as well. Atherosclerotic calcifications are present at the aortic arch. Degenerate changes are noted at both shoulders. The visualized soft tissues and bony thorax are unremarkable. IMPRESSION: 1. Cardiomegaly with increasing bilateral pleural effusions and bibasilar airspace disease. This is most concerning for congestive heart failure. 2. Increasing pulmonary vascular congestion. 3. Bibasilar airspace disease likely reflects atelectasis. Early infection is not excluded. 4. Atherosclerosis of the thoracic aorta. Electronically Signed   By: San Morelle M.D.   On: 12/16/2015 17:19    ECG & Cardiac Imaging    EKG: SR with TW flatting in Lead I,II, III and inversion in v4, v5,v6 noted on previous tracings.  Echo: 12/18/2015  Study Conclusions  - Left ventricle: The cavity size was mildly dilated. Wall   thickness was normal. The estimated ejection fraction was 20%.   Diffuse hypokinesis. There is severe hypokinesis of the   mid-apicalanteroseptal, anterolateral, and inferior myocardium.   Prominent trabeculation noted at LV apex. No definitive LV mural   thrombus, although Definity contrast would delineate more   clearly. The study is not technically sufficient to allow   evaluation of LV diastolic function. - Aortic valve: Mildly to moderately calcified annulus. Trileaflet;   mildly  calcified leaflets. There was moderate regurgitation. - Aortic root: The aortic root was mildly dilated. - Mitral valve: Mildly thickened leaflets . There was trivial   regurgitation. - Tricuspid valve: There was trivial regurgitation. - Pulmonary arteries: Systolic pressure could not be accurately   estimated. - Pericardium, extracardiac: A small pericardial effusion was   identified anterior to the heart.  Impressions:  - Mildly dilated LV with LVEF approximately 20%. There is diffuse   hypokinesis, most severe in the mid to apical anteroseptal,   anterolateral, and inferior walls. Prominent apical LV   trabeculation, no definitive mural thrombus. Definity contrast   would delineate more clearly. Indeterminate diastolic function.   Mildly thickened mitral leaflets with trivial mitral   regurgitation. Mildly dilated aortic root. Sclerotic aortic valve   with moderate aortic regurgitation. Trivial tricuspid   regurgitation. Small anterior pericardial effusion.  Assessment & Plan    Mr. Reichert is a 40 male patient of Dr. Olin Pia with PMH of dementia, HTN, HLD, DM, combined CHF, prostate and colon CA.  1. Acute on chronic combined CHF: Presented to the [facility] ED with persistent cough, orthopnea and PND. Was recently seen at PCP office and dx with PNA and UTI, placed on antibiotics. In th ED BNP was noted to be above 2000, and chest x-ray concerning for pulmonary edema. Was given IV lasix with good UOP. Repeat 2D echo was done and showed a decrease in EF from 42% in 2015 to 20% with diffuse hypokinesis now.  -- Was continued on IV lasix but did have uptrend in Cr, so IV lasix was held. Currently net neg 4.2L. Weight with downward trend 160>>155lbs -- Does not appears to be markedly fluid overloaded at this time on exam, reports breathing easier today.  -- Given his  age, along with dementia and other co-morbidities, would not pursue further ischemic evaluation. Would add low dose coreg  and lasix 20mg  PO daily. No room to add ACE-I or ARB at this time given renal function.  -- Repeat 2D echo with contrast  2. HTN: Stable, monitor with the addition of BB.   Barnet Pall, NP-C Pager [phone] 12/19/2015, 2:34 PM As above; patient seen and examined; poor historian due to dementia; admitted with probable acute on chronic systolic congestive heart failure.initial BNP 2189. Troponin minimally elevated with no clear trend. He has been diuresed with mild increasing creatinine. Echocardiogram showed ejection fraction 20%, moderate aortic insufficiency and small pericardial effusion. LV apical thrombus could not be excluded.  1 acute on chronic systolic congestive heart failure-the patient's volume status appears to have improved. His creatinine is minimally elevated. Ejection fraction is 20%. He has had problems with orthostasis in the past. Would recommend treating with Lasix 20 mg daily and follow renal function. Add carvedilol 3.125 mg twice a day. Increase as tolerated but allow blood pressure to run higher given history of orthostasis. We'll hold on ACE inhibitor for now as renal function is slightly worse. This can be considered once it is clear renal function is stable. Note apical thrombus cannot be excluded on echocardiogram. Will plan limited study with Definity. 2 cardiomyopathy-plan as outlined in #1. 3 mild elevation in troponin-not diagnostic of acute coronary syndrome. Not a candidate for further aggressive cardiac evaluation. 4 acute kidney injury-following renal function closely with diuresis.  Kirk Ruths, MD

## 2015-12-19 NOTE — Plan of Care (Signed)
Problem: Safety: Goal: Ability to remain free from injury will improve Outcome: Progressing No fall or injury noted, safety precautions and fall preventions maintained  Problem: Pain Managment: Goal: General experience of comfort will improve Outcome: Progressing Medicated once for generalized discomfort  Problem: Skin Integrity: Goal: Risk for impaired skin integrity will decrease Outcome: Progressing No skin issues noted  Problem: Tissue Perfusion: Goal: Risk factors for ineffective tissue perfusion will decrease Outcome: Progressing No s/s of dvt noted  Problem: Activity: Goal: Risk for activity intolerance will decrease Outcome: Progressing On bedrest  Problem: Nutrition: Goal: Adequate nutrition will be maintained Outcome: Progressing Patient is on nutrition supplement, taking PO well  Problem: Bowel/Gastric: Goal: Will not experience complications related to bowel motility Outcome: Progressing No gastric or bowel issues reported. Denies nausea
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
## 2015-12-20 DIAGNOSIS — N179 Acute kidney failure, unspecified: Secondary | ICD-10-CM | POA: Diagnosis not present

## 2015-12-20 DIAGNOSIS — I5023 Acute on chronic systolic (congestive) heart failure: Secondary | ICD-10-CM | POA: Diagnosis not present

## 2015-12-20 DIAGNOSIS — R05 Cough: Secondary | ICD-10-CM | POA: Diagnosis not present

## 2015-12-20 DIAGNOSIS — I5041 Acute combined systolic (congestive) and diastolic (congestive) heart failure: Secondary | ICD-10-CM | POA: Diagnosis not present

## 2015-12-20 DIAGNOSIS — I5021 Acute systolic (congestive) heart failure: Secondary | ICD-10-CM | POA: Diagnosis not present

## 2015-12-20 DIAGNOSIS — I509 Heart failure, unspecified: Secondary | ICD-10-CM | POA: Diagnosis not present

## 2015-12-20 LAB — GLUCOSE, CAPILLARY
GLUCOSE-CAPILLARY: 73 mg/dL (ref 65–99)
GLUCOSE-CAPILLARY: 136 mg/dL — AB (ref 65–99)
GLUCOSE-CAPILLARY: 94 mg/dL (ref 65–99)
Glucose-Capillary: 97 mg/dL (ref 65–99)

## 2015-12-20 MED ORDER — CARVEDILOL 3.125 MG PO TABS
3.1250 mg | ORAL_TABLET | Freq: Two times a day (BID) | ORAL | Status: DC
  Administered 2015-12-20 – 2015-12-22 (×6): 3.125 mg via ORAL
  Filled 2015-12-20 (×6): qty 1

## 2015-12-20 MED ORDER — INSULIN ASPART 100 UNIT/ML ~~LOC~~ SOLN
0.0000 [IU] | Freq: Three times a day (TID) | SUBCUTANEOUS | Status: DC
  Administered 2015-12-22: 1 [IU] via SUBCUTANEOUS

## 2015-12-20 NOTE — Progress Notes (Signed)
[facility]                                                                              Patient Demographics  Russell [name], is a 82 y.o. male, DOB - [birth year], [id number]  Admit date - 12/16/2015   Admitting Physician Lily Kocher, MD  Outpatient Primary MD for the patient is Scarlette Calico, MD  Outpatient specialists:   LOS - 4  days    Chief Complaint  Patient presents with  . Pneumonia       Brief summary   Russell [name] is a 82 y.o. gentleman with a history of dementia, HTN, HLD, DM, prostate and colon cancer who presents to the ED with several of his children for evaluation of persistent cough (refractory to antibiotics and anti-tussives), orthopnea, and PND.  The patient saw his PCP three days ago with similar complaints and was diagnosed with pneumonia as well as a UTI.  He was given a prescription for omnicef.  He has not improved.  He has significant lower extremity edema.  Shortness of breath is worse when lying flat.  He has had symptoms of PND.  The family does not check his weight daily and they do not follow any real dietary or fluid restrictions.   At baseline, he ambulates independently with a cane. ED Course: Chest xray suggestive of pulmonary edema.  BNP greater than 2000.  First troponin 0.18.  He does not have an oxygen requirement.  He has been given lasix 80mg  IV x one.  Of note, last echo in 2015 showed an EF of 42% with grade I diastolic dysfunction.  The patient has not been on lasix at home.  Family reports that the patient is followed by Dr. Caryl Comes of cardiology. Also, outpatient urine culture from earlier this week growing pseudomonas and enterococcus species.  Assessment & Plan    Acute on chronic combined CHF - Patient presented with shortness of breath, chest x-ray suggestive of pulmonary edema, BNP 2189 - Serial troponins positive, however patient presented with acute on chronic CHF.  - Patient on IV Lasix, negative balance of 2.9  L however creatinine trended up to 1.6, Hence IV Lasix held. -Negative balance of 3.4 L, weight down from 160-> 153 lbs  - 2-D echo showed EF of 20% with diffuse hypokinesis, cardiology consulted - Creatinine had trended up to 1.8 yesterday, hence Lasix was held. Cardiology recommended to continue Lasix 20 mg daily, Coreg 3.125 mgBID   Acute kidney injury -Improving, likely due to UTI, - Hold ARB, HCTZ,  - Follow BMET in a.m.   Diabetes mellitus -Continue sliding scale insulin, CBGs controlled - hemoglobin A1c 5.4   enterococcus, pseudomonas UTI -Continue levofloxacin for 7 days  Anxiety/insomnia - Continue Xanax, patient takes it twice a day and third time if needed in the evening  Code Status: Full CODE STATUS  DVT Prophylaxis:  Lovenox  Family Communication: Discussed in detail with the patient, all imaging results, lab results explained to the patient and daughter yesterday  Disposition Plan: PT evaluation recommended skilled nursing facility, SW consult placed   Time Spent in minutes   84minutes  Procedures:    Consultants:     Antimicrobials:   Levaquin 8/19   Medications  Scheduled Meds: . ALPRAZolam  0.5 mg Oral q morning - 10a  . ALPRAZolam  0.5 mg Oral QHS  . aspirin EC  81 mg Oral Daily  . atorvastatin  40 mg Oral q1800  . carvedilol  3.125 mg Oral BID WC  . enoxaparin (LOVENOX) injection  30 mg Subcutaneous Daily  . fesoterodine  8 mg Oral Daily  . furosemide  20 mg Oral Daily  . insulin aspart  0-9 Units Subcutaneous TID WC  . levofloxacin  500 mg Oral Q48H  . multivitamin with minerals  1 tablet Oral Daily  . polyethylene glycol  17 g Oral Daily  . sertraline  100 mg Oral Daily  . sodium chloride flush  3 mL Intravenous Q12H   Continuous Infusions:  PRN Meds:.sodium chloride, acetaminophen, HYDROcodone-acetaminophen, ondansetron (ZOFRAN) IV, sodium chloride flush   Antibiotics   Anti-infectives    Start     Dose/Rate Route Frequency  Ordered Stop   12/19/15 0130  levofloxacin (LEVAQUIN) tablet 500 mg     500 mg Oral Every 48 hours 12/18/15 1327     12/17/15 0130  levofloxacin (LEVAQUIN) IVPB 750 mg  Status:  Discontinued     750 mg 100 mL/hr over 90 Minutes Intravenous Every 48 hours 12/17/15 0125 12/18/15 1327        Subjective:   Russell [name] was seen and examined today.  No complaints. No worsening shortness of breath. Patient denies dizziness,  abdominal pain, N/V/D/C, new weakness, numbess, tingling. No acute events overnight.  No fevers and chills.  Objective:   Vitals:   12/20/15 0537 12/20/15 0743 12/20/15 1013 12/20/15 1154  BP: 126/65 124/66 126/62 (!) 113/53  Pulse: 70 67 70 66  Resp: 20 (!) 21  (!) 21  Temp: 98.6 F (37 C) 97.5 F (36.4 C)  97.4 F (36.3 C)  TempSrc: Oral Axillary  Oral  SpO2: 98% 100%  100%  Weight: 69.6 kg (153 lb 6.4 oz)     Height:        Intake/Output Summary (Last 24 hours) at 12/20/15 1358 Last data filed at 12/20/15 1055  Gross per 24 hour  Intake              780 ml  Output              250 ml  Net              530 ml     Wt Readings from Last 3 Encounters:  12/20/15 69.6 kg (153 lb 6.4 oz)  12/13/15 75.4 kg (166 lb 4 oz)  11/29/15 74.4 kg (164 lb)     Exam  General: Alert and oriented,  NAD, frail  HEENT:    Neck: Supple,   Cardiovascular: S1 S2 clear, RRR  Respiratory:Decreased breath sounds at the bases  Gastrointestinal: Soft, nontender, nondistended, + bowel sounds  Ext: no cyanosis clubbing, 1+ edema  Neuro: No new deficits  Skin: No rashes  Psych: Normal affect and demeanor   Data Reviewed:  I have personally reviewed following labs and imaging studies  Micro Results Recent Results (from the past 240 hour(s))  CULTURE, URINE COMPREHENSIVE     Status: None   Collection Time: 12/13/15  3:59 PM  Result Value Ref Range Status   Culture PSEUDOMONAS AERUGINOSA ENTEROCOCCUS SPECIES   Final   Colony Count 10,000-50,000 CFU/mL   Final  Colony Count 10,000-50,000 CFU/mL  Final   Organism ID, Bacteria PSEUDOMONAS AERUGINOSA  Final   Colony Count 10,000-50,000 CFU/mL  Final   Organism ID, Bacteria ENTEROCOCCUS SPECIES  Final      Susceptibility   Enterococcus species -  (no method available)    AMPICILLIN <=2 Sensitive     LEVOFLOXACIN 1 Sensitive     NITROFURANTOIN <=16 Sensitive     VANCOMYCIN 2 Sensitive     TETRACYCLINE >=16 Resistant    Pseudomonas aeruginosa -  (no method available)    PIP/TAZO 8 Sensitive     IMIPENEM >=16 Resistant     CEFTRIAXONE  Resistant     CEFTAZIDIME 4 Sensitive     CEFEPIME <=1 Sensitive     GENTAMICIN <=1 Sensitive     TOBRAMYCIN <=1 Sensitive     CIPROFLOXACIN <=0.25 Sensitive     LEVOFLOXACIN 1 Sensitive     Radiology Reports Dg Chest 2 View  Result Date: 12/16/2015 CLINICAL DATA:  Shortness of breath and congestion.  Pneumonia. EXAM: CHEST  2 VIEW COMPARISON:  Two-view chest x-ray 12/13/2015 FINDINGS: The heart is enlarged. Bilateral pleural effusions have increased. Pulmonary vascular congestion is present. Bibasilar airspace disease has increased as well. Atherosclerotic calcifications are present at the aortic arch. Degenerate changes are noted at both shoulders. The visualized soft tissues and bony thorax are unremarkable. IMPRESSION: 1. Cardiomegaly with increasing bilateral pleural effusions and bibasilar airspace disease. This is most concerning for congestive heart failure. 2. Increasing pulmonary vascular congestion. 3. Bibasilar airspace disease likely reflects atelectasis. Early infection is not excluded. 4. Atherosclerosis of the thoracic aorta. Electronically Signed   By: San Morelle M.D.   On: 12/16/2015 17:19   Dg Chest 2 View  Result Date: 12/13/2015 CLINICAL DATA:  Cough, chest congestion, shortness of breath, and chest pain for the past 2 days; history of hypertension diabetes. History of CHF, community-acquired pneumonia, colonic and prostate  malignancy. Remote history of smoking. EXAM: CHEST  2 VIEW COMPARISON:  PA and lateral chest x-ray of November 23, 2015 FINDINGS: The lungs are adequately inflated. There is bibasilar atelectasis or infiltrate with small bilateral pleural effusions more conspicuous than on the previous study. The cardiac silhouette remains enlarged. The pulmonary vascularity is mildly engorged but less conspicuous than on the previous study. There is calcification in the wall of the aortic arch. The observed bony thorax exhibits no acute abnormality. There is degenerative change of both shoulders. IMPRESSION: New bibasilar atelectasis or pneumonia is present. Persistent mild CHF with small bilateral pleural effusions. Followup PA and lateral chest X-ray is recommended in 3-4 weeks following trial of antibiotic therapy to ensure resolution and exclude underlying malignancy. Aortic atherosclerosis. Electronically Signed   By: David  Martinique M.D.   On: 12/13/2015 16:20   Dg Chest 2 View  Result Date: 11/23/2015 CLINICAL DATA:  Cough and weakness for 1 week. EXAM: CHEST  2 VIEW COMPARISON:  PA and lateral chest 09/29/2015 and 04/06/2015. FINDINGS: The patient has small bilateral pleural effusions. There is marked cardiomegaly and pulmonary vascular congestion. Aortic atherosclerosis is noted. IMPRESSION: Cardiomegaly and pulmonary vascular congestion with small bilateral pleural effusions. Atherosclerosis. Electronically Signed   By: Inge Rise M.D.   On: 11/23/2015 13:31  Ct Head Wo Contrast  Result Date: 11/23/2015 CLINICAL DATA:  Generalized weakness since yesterday. EXAM: CT HEAD WITHOUT CONTRAST TECHNIQUE: Contiguous axial images were obtained from the base of the skull through the vertex without intravenous contrast. COMPARISON:  03/08/2005 FINDINGS: There is no  evidence for acute hemorrhage, hydrocephalus, mass lesion, or abnormal extra-axial fluid collection. No definite CT evidence for acute infarction. Diffuse loss of  parenchymal volume is consistent with atrophy. Patchy low attenuation in the deep hemispheric and periventricular white matter is nonspecific, but likely reflects chronic microvascular ischemic demyelination. The visualized paranasal sinuses and mastoid air cells are clear. IMPRESSION: 1. Stable.  No acute intracranial abnormality. 2. Atrophy with chronic small vessel white matter ischemic demyelination. Electronically Signed   By: Misty Stanley M.D.   On: 11/23/2015 14:15   Lab Data:  CBC:  Recent Labs Lab 12/16/15 1532  WBC 3.7*  NEUTROABS 2.1  HGB 12.5*  HCT 36.9*  MCV 87.2  PLT [id number]   Basic Metabolic Panel:  Recent Labs Lab 12/16/15 1532 12/17/15 0520 12/17/15 1457 12/18/15 0250 12/19/15 0553  NA 134* 138  --  139 139  K 3.9 3.7  --  4.7 4.0  CL 106 106  --  102 105  CO2 20* 24  --  23 27  GLUCOSE 91 74  --  89 99  BUN 16 14  --  20 22*  CREATININE 1.31* 1.29*  --  1.68* 1.84*  CALCIUM 8.9 8.9  --  9.2 8.7*  MG  --   --  1.7 1.7  --    GFR: Estimated Creatinine Clearance: 24.9 mL/min (by C-G formula based on SCr of 1.84 mg/dL). Liver Function Tests:  Recent Labs Lab 12/16/15 1532  AST 45*  ALT 39  ALKPHOS 84  BILITOT 0.8  PROT 6.0*  ALBUMIN 3.4*   No results for input(s): LIPASE, AMYLASE in the last 168 hours. No results for input(s): AMMONIA in the last 168 hours. Coagulation Profile:  Recent Labs Lab 12/17/15 0121  INR 1.22   Cardiac Enzymes:  Recent Labs Lab 12/16/15 1945 12/17/15 0121 12/17/15 0520 12/17/15 1300  TROPONINI 0.18* 0.20* 0.21* 0.19*   BNP (last 3 results) No results for input(s): PROBNP in the last 8760 hours. HbA1C: No results for input(s): HGBA1C in the last 72 hours. CBG:  Recent Labs Lab 12/19/15 1153 12/19/15 1631 12/19/15 2124 12/20/15 0743 12/20/15 1151  GLUCAP 99 149* 125* 73 136*   Lipid Profile: No results for input(s): CHOL, HDL, LDLCALC, TRIG, CHOLHDL, LDLDIRECT in the last 72 hours. Thyroid  Function Tests: No results for input(s): TSH, T4TOTAL, FREET4, T3FREE, THYROIDAB in the last 72 hours. Anemia Panel: No results for input(s): VITAMINB12, FOLATE, FERRITIN, TIBC, IRON, RETICCTPCT in the last 72 hours. Urine analysis:    Component Value Date/Time   COLORURINE YELLOW 12/13/2015 1559   APPEARANCEUR CLEAR 12/13/2015 1559   LABSPEC 1.010 12/13/2015 1559   PHURINE 6.0 12/13/2015 1559   GLUCOSEU NEGATIVE 12/13/2015 1559   HGBUR NEGATIVE 12/13/2015 1559   HGBUR large 08/03/2008 1258   BILIRUBINUR NEGATIVE 12/13/2015 1559   BILIRUBINUR neg 04/09/2014 1056   KETONESUR NEGATIVE 12/13/2015 1559   PROTEINUR NEGATIVE 11/23/2015 1300   UROBILINOGEN 0.2 12/13/2015 1559   NITRITE NEGATIVE 12/13/2015 1559   LEUKOCYTESUR TRACE (A) 12/13/2015 1559     Russell [name] M.D. [facility] 12/20/2015, 1:58 PM  Pager: [phone] Between 7am to 7pm - call Pager - [phone]  After 7pm go to [email or web address] - password [email or web address]  Call night coverage person covering after 7pm

## 2015-12-20 NOTE — Progress Notes (Signed)
    Subjective:  Denies CP or dyspnea   Objective:  Vitals:   12/19/15 2211 12/20/15 0537 12/20/15 0743 12/20/15 1013  BP: 134/68 126/65 124/66 126/62  Pulse: 86 70 67 70  Resp: 20 20 (!) 21   Temp: 98.2 F (36.8 C) 98.6 F (37 C) 97.5 F (36.4 C)   TempSrc: Oral Oral Axillary   SpO2: 100% 98% 100%   Weight:  153 lb 6.4 oz (69.6 kg)    Height:        Intake/Output from previous day:  Intake/Output Summary (Last 24 hours) at 12/20/15 1056 Last data filed at 12/20/15 1055  Gross per 24 hour  Intake             1020 ml  Output              250 ml  Net              770 ml    Physical Exam: Physical exam: Well-developed frail in no acute distress.  Skin is warm and dry.  HEENT is normal.  Neck is supple.  Chest is clear to auscultation with normal expansion.  Cardiovascular exam is regular rate and rhythm.  Abdominal exam nontender or distended. No masses palpated. Extremities show no edema. neuro grossly intact    Lab Results: Basic Metabolic Panel:  Recent Labs  12/17/15 1457 12/18/15 0250 12/19/15 0553  NA  --  139 139  K  --  4.7 4.0  CL  --  102 105  CO2  --  23 27  GLUCOSE  --  89 99  BUN  --  20 22*  CREATININE  --  1.68* 1.84*  CALCIUM  --  9.2 8.7*  MG 1.7 1.7  --    Cardiac Enzymes:  Recent Labs  12/17/15 1300  TROPONINI 0.19*     Assessment/Plan:  1 acute on chronic systolic congestive heart failure-pt appears to be euvolemic. Ejection fraction is 20%. He has had problems with orthostasis in the past. Would recommend continuing Lasix 20 mg daily. Continue carvedilol 3.125 mg twice a day. Increase as outpt as tolerated but allow blood pressure to run higher given history of orthostasis. We'll hold on ACE inhibitor for now as renal function is slightly worse. This can be considered once it is clear renal function is stable. Note apical thrombus cannot be excluded on echocardiogram. Will plan limited study with [facility]. 2  cardiomyopathy-plan as outlined in #1. 3 mild elevation in troponin-not diagnostic of acute coronary syndrome. Not a candidate for further aggressive cardiac evaluation. 4 acute kidney injury-following renal function closely with diuresis.  Kirk Ruths 12/20/2015, 10:56 AM

## 2015-12-20 NOTE — Care Management Important Message (Signed)
Important Message  Patient Details  Name: Sanskar Mandell MRN: [id number] Date of Birth: [birth year]   Medicare Important Message Given:  Yes    Tania Perrott 12/20/2015, 10:20 AM

## 2015-12-20 NOTE — Progress Notes (Signed)
Late entry: 12/19/15 1900 Author went in to speak with the daughter of patient the power of attorney in regards to some concerns she shared about nursing protocol and her behavior towards the staff. It was reported that the daughter was verbally abusive to the staff by using profanity and name calling. It was explained to the daughter that this behavior was hindering patient care and creating a hostile work environment for our nursing staff of which both is not acceptable. She was advised to contact me directly if there are any concerns that need to be addressed regarding staff or protocol. She was very receptive during this conversation.

## 2015-12-21 ENCOUNTER — Other Ambulatory Visit (HOSPITAL_COMMUNITY): Payer: Self-pay

## 2015-12-21 DIAGNOSIS — N390 Urinary tract infection, site not specified: Secondary | ICD-10-CM | POA: Diagnosis not present

## 2015-12-21 DIAGNOSIS — I5023 Acute on chronic systolic (congestive) heart failure: Secondary | ICD-10-CM | POA: Diagnosis not present

## 2015-12-21 DIAGNOSIS — I5043 Acute on chronic combined systolic (congestive) and diastolic (congestive) heart failure: Secondary | ICD-10-CM

## 2015-12-21 DIAGNOSIS — R5381 Other malaise: Secondary | ICD-10-CM

## 2015-12-21 DIAGNOSIS — N179 Acute kidney failure, unspecified: Secondary | ICD-10-CM | POA: Diagnosis not present

## 2015-12-21 DIAGNOSIS — E119 Type 2 diabetes mellitus without complications: Secondary | ICD-10-CM | POA: Diagnosis not present

## 2015-12-21 DIAGNOSIS — I5021 Acute systolic (congestive) heart failure: Secondary | ICD-10-CM | POA: Diagnosis not present

## 2015-12-21 LAB — BASIC METABOLIC PANEL
ANION GAP: 7 (ref 5–15)
BUN: 22 mg/dL — ABNORMAL HIGH (ref 6–20)
CHLORIDE: 106 mmol/L (ref 101–111)
CO2: 26 mmol/L (ref 22–32)
CREATININE: 1.43 mg/dL — AB (ref 0.61–1.24)
Calcium: 8.3 mg/dL — ABNORMAL LOW (ref 8.9–10.3)
GFR calc non Af Amer: 42 mL/min — ABNORMAL LOW (ref >60)
GFR, EST AFRICAN AMERICAN: 48 mL/min — AB (ref >60)
Glucose, Bld: 78 mg/dL (ref 65–99)
POTASSIUM: 4.1 mmol/L (ref 3.5–5.1)
Sodium: 139 mmol/L (ref 135–145)

## 2015-12-21 LAB — GLUCOSE, CAPILLARY
GLUCOSE-CAPILLARY: 89 mg/dL (ref 65–99)
GLUCOSE-CAPILLARY: 87 mg/dL (ref 65–99)
GLUCOSE-CAPILLARY: 79 mg/dL (ref 65–99)
Glucose-Capillary: 71 mg/dL (ref 65–99)

## 2015-12-21 NOTE — Clinical Social Work Note (Signed)
CSW called patient's daughter, [name] [facility]), to discuss PT recommendations for SNF. She would prefer that he try HHPT first before a nursing home because he told her today that he does not want to go to a nursing home. She was also glad that he was able to walk 20 feet on Monday. CSW explained that the [facility] would handle this and could likely set him up with a [facility] CSW as well in the event that he eventually needs SNF. Patient's daughter expressed understanding and is happy with this decision.  CSW sent notification to [facility] CSW signing off. Consult again if any social work needs arise.  Dayton Scrape, [place]

## 2015-12-21 NOTE — Care Management Important Message (Signed)
Important Message  Patient Details  Name: Russell [name] MRN: [id number] Date of Birth: 09/04/1923   Medicare Important Message Given:  Yes    [name] Montine Circle 12/21/2015, 11:23 AM

## 2015-12-21 NOTE — Progress Notes (Signed)
[facility]                                                                              Patient Demographics  Russell [name], is a 82 y.o. male, DOB - May 10, 1923, [id number]  Admit date - 12/16/2015   Admitting Physician Lily Kocher, MD  Outpatient Primary MD for the patient is Scarlette Calico, MD  Outpatient specialists:   LOS - 5  days    Chief Complaint  Patient presents with  . Pneumonia       Brief summary   Russell [name] is a 82 y.o. gentleman with a history of dementia, HTN, HLD, DM, prostate and colon cancer who presents to the ED with several of his children for evaluation of persistent cough (refractory to antibiotics and anti-tussives), orthopnea, and PND.  The patient saw his PCP three days ago with similar complaints and was diagnosed with pneumonia as well as a UTI.  He was given a prescription for omnicef.  He has not improved.  He has significant lower extremity edema.  Shortness of breath is worse when lying flat.  He has had symptoms of PND.  The family does not check his weight daily and they do not follow any real dietary or fluid restrictions.   At baseline, he ambulates independently with a cane. ED Course: Chest xray suggestive of pulmonary edema.  BNP greater than 2000.  First troponin 0.18.  He does not have an oxygen requirement.  He has been given lasix 80mg  IV x one.  Of note, last echo in 2015 showed an EF of 42% with grade I diastolic dysfunction.  The patient has not been on lasix at home.  Family reports that the patient is followed by Dr. Caryl Comes of cardiology. Also, outpatient urine culture from earlier this week growing pseudomonas and enterococcus species.  Assessment & Plan   Acute on chronic combined CHF - Patient presented with shortness of breath, chest x-ray suggestive of pulmonary edema, BNP 2189 - Serial troponins positive, however patient presented with acute on chronic CHF and denies CP. - Patient was initially on IV  Lasix, however creatinine trended up to 1.6, Hence IV Lasix held. -Negative balance of 3.4 L, weight down from 160-> 153 lbs since admission - 2-D echo showed EF of 20% with diffuse hypokinesis, cardiology was consulted and recommended to continue Lasix 20 mg daily now, Coreg 3.125 mgBID and to hold on ACE/ARB until renal function improves. -no further work up while inpatient   Acute kidney injury -Improving, likely due to UTI and diuresis  -will continue holding ARB, HCTZ,  -Follow BMET in a.m.   Diabetes mellitus -Continue sliding scale insulin, CBGs well controlled - hemoglobin A1c 5.4   enterococcus, pseudomonas UTI -will continue levofloxacin and complete a total of  7 days  Anxiety/insomnia - Continue Xanax, patient takes it twice a day and third time if needed in the evening  Code Status: Full CODE STATUS  DVT Prophylaxis:  Lovenox  Family Communication: Discussed in detail with the patient and son in law by phone.  Disposition Plan: PT evaluation recommended skilled nursing facility, but patient denies SNF and  will discharge to home with home health services.   Time Spent in minutes   25 minutes  Procedures:   See below for x-ray reports   2-D echo: pending  Consultants:   Cardiology   Antimicrobials:   Levaquin 8/19   Medications  Scheduled Meds: . ALPRAZolam  0.5 mg Oral q morning - 10a  . ALPRAZolam  0.5 mg Oral QHS  . aspirin EC  81 mg Oral Daily  . atorvastatin  40 mg Oral q1800  . carvedilol  3.125 mg Oral BID WC  . enoxaparin (LOVENOX) injection  30 mg Subcutaneous Daily  . fesoterodine  8 mg Oral Daily  . furosemide  20 mg Oral Daily  . insulin aspart  0-9 Units Subcutaneous TID WC  . levofloxacin  500 mg Oral Q48H  . multivitamin with minerals  1 tablet Oral Daily  . polyethylene glycol  17 g Oral Daily  . sertraline  100 mg Oral Daily  . sodium chloride flush  3 mL Intravenous Q12H   Continuous Infusions:  PRN Meds:.sodium chloride,  acetaminophen, HYDROcodone-acetaminophen, ondansetron (ZOFRAN) IV, sodium chloride flush   Antibiotics   Anti-infectives    Start     Dose/Rate Route Frequency Ordered Stop   12/19/15 0130  levofloxacin (LEVAQUIN) tablet 500 mg     500 mg Oral Every 48 hours 12/18/15 1327 12/23/15 2359   12/17/15 0130  levofloxacin (LEVAQUIN) IVPB 750 mg  Status:  Discontinued     750 mg 100 mL/hr over 90 Minutes Intravenous Every 48 hours 12/17/15 0125 12/18/15 1327      Subjective:   Russell [name] was seen and examined today.  No complaints. No worsening shortness of breath. Patient denies dizziness,  abdominal pain, N/V/D/C. Wants to go home at discharge.  Objective:   Vitals:   12/21/15 1123 12/21/15 1209 12/21/15 1547 12/21/15 1738  BP: (!) 121/55 (!) 107/54 (!) 127/57 127/60  Pulse: 69 69 69 72  Resp:  20 20   Temp: 97.7 F (36.5 C) 97.5 F (36.4 C) 97.7 F (36.5 C)   TempSrc: Oral Oral Oral   SpO2: 100% 96% 100%   Weight:      Height:        Intake/Output Summary (Last 24 hours) at 12/21/15 1743 Last data filed at 12/21/15 1424  Gross per 24 hour  Intake              600 ml  Output              450 ml  Net              150 ml     Wt Readings from Last 3 Encounters:  12/21/15 69 kg (152 lb 1.6 oz)  12/13/15 75.4 kg (166 lb 4 oz)  11/29/15 74.4 kg (164 lb)     Exam  General: Alert and oriented, Afebrile, frail and in no distress. Denies CP and SOB.  HEENT/NECK:  Supple, no bruits, no JVD appreciated. Fair dentition, no thrush  Cardiovascular: S1 S2 clear, RRR  Respiratory:Decreased breath sounds at the bases, no frank crackles, no wheezing.  Gastrointestinal: Soft, nontender, nondistended, + bowel sounds  Ext: no cyanosis clubbing, trace to 1+ edema  Neuro: No new focal deficits, able to follow commands   Skin: No rashes  Psych: slight flat affect, but normal demeanor   Data Reviewed:  I have personally reviewed following labs and imaging studies  Micro  Results Recent Results (from the past 240 hour(s))  CULTURE, URINE COMPREHENSIVE     Status: None   Collection Time: 12/13/15  3:59 PM  Result Value Ref Range Status   Culture PSEUDOMONAS AERUGINOSA ENTEROCOCCUS SPECIES   Final   Colony Count 10,000-50,000 CFU/mL  Final   Colony Count 10,000-50,000 CFU/mL  Final   Organism ID, Bacteria PSEUDOMONAS AERUGINOSA  Final   Colony Count 10,000-50,000 CFU/mL  Final   Organism ID, Bacteria ENTEROCOCCUS SPECIES  Final      Susceptibility   Enterococcus species -  (no method available)    AMPICILLIN <=2 Sensitive     LEVOFLOXACIN 1 Sensitive     NITROFURANTOIN <=16 Sensitive     VANCOMYCIN 2 Sensitive     TETRACYCLINE >=16 Resistant    Pseudomonas aeruginosa -  (no method available)    PIP/TAZO 8 Sensitive     IMIPENEM >=16 Resistant     CEFTRIAXONE  Resistant     CEFTAZIDIME 4 Sensitive     CEFEPIME <=1 Sensitive     GENTAMICIN <=1 Sensitive     TOBRAMYCIN <=1 Sensitive     CIPROFLOXACIN <=0.25 Sensitive     LEVOFLOXACIN 1 Sensitive     Radiology Reports Dg Chest 2 View  Result Date: 12/16/2015 CLINICAL DATA:  Shortness of breath and congestion.  Pneumonia. EXAM: CHEST  2 VIEW COMPARISON:  Two-view chest x-ray 12/13/2015 FINDINGS: The heart is enlarged. Bilateral pleural effusions have increased. Pulmonary vascular congestion is present. Bibasilar airspace disease has increased as well. Atherosclerotic calcifications are present at the aortic arch. Degenerate changes are noted at both shoulders. The visualized soft tissues and bony thorax are unremarkable. IMPRESSION: 1. Cardiomegaly with increasing bilateral pleural effusions and bibasilar airspace disease. This is most concerning for congestive heart failure. 2. Increasing pulmonary vascular congestion. 3. Bibasilar airspace disease likely reflects atelectasis. Early infection is not excluded. 4. Atherosclerosis of the thoracic aorta. Electronically Signed   By: San Morelle M.D.    On: 12/16/2015 17:19   Dg Chest 2 View  Result Date: 12/13/2015 CLINICAL DATA:  Cough, chest congestion, shortness of breath, and chest pain for the past 2 days; history of hypertension diabetes. History of CHF, community-acquired pneumonia, colonic and prostate malignancy. Remote history of smoking. EXAM: CHEST  2 VIEW COMPARISON:  PA and lateral chest x-ray of November 23, 2015 FINDINGS: The lungs are adequately inflated. There is bibasilar atelectasis or infiltrate with small bilateral pleural effusions more conspicuous than on the previous study. The cardiac silhouette remains enlarged. The pulmonary vascularity is mildly engorged but less conspicuous than on the previous study. There is calcification in the wall of the aortic arch. The observed bony thorax exhibits no acute abnormality. There is degenerative change of both shoulders. IMPRESSION: New bibasilar atelectasis or pneumonia is present. Persistent mild CHF with small bilateral pleural effusions. Followup PA and lateral chest X-ray is recommended in 3-4 weeks following trial of antibiotic therapy to ensure resolution and exclude underlying malignancy. Aortic atherosclerosis. Electronically Signed   By: David  Martinique M.D.   On: 12/13/2015 16:20   Dg Chest 2 View  Result Date: 11/23/2015 CLINICAL DATA:  Cough and weakness for 1 week. EXAM: CHEST  2 VIEW COMPARISON:  PA and lateral chest 09/29/2015 and 04/06/2015. FINDINGS: The patient has small bilateral pleural effusions. There is marked cardiomegaly and pulmonary vascular congestion. Aortic atherosclerosis is noted. IMPRESSION: Cardiomegaly and pulmonary vascular congestion with small bilateral pleural effusions. Atherosclerosis. Electronically Signed   By: Inge Rise M.D.   On: 11/23/2015 13:31  Ct Head  Wo Contrast  Result Date: 11/23/2015 CLINICAL DATA:  Generalized weakness since yesterday. EXAM: CT HEAD WITHOUT CONTRAST TECHNIQUE: Contiguous axial images were obtained from the base of  the skull through the vertex without intravenous contrast. COMPARISON:  03/08/2005 FINDINGS: There is no evidence for acute hemorrhage, hydrocephalus, mass lesion, or abnormal extra-axial fluid collection. No definite CT evidence for acute infarction. Diffuse loss of parenchymal volume is consistent with atrophy. Patchy low attenuation in the deep hemispheric and periventricular white matter is nonspecific, but likely reflects chronic microvascular ischemic demyelination. The visualized paranasal sinuses and mastoid air cells are clear. IMPRESSION: 1. Stable.  No acute intracranial abnormality. 2. Atrophy with chronic small vessel white matter ischemic demyelination. Electronically Signed   By: Misty Stanley M.D.   On: 11/23/2015 14:15   Lab Data:  CBC:  Recent Labs Lab 12/16/15 1532  WBC 3.7*  NEUTROABS 2.1  HGB 12.5*  HCT 36.9*  MCV 87.2  PLT [id number]   Basic Metabolic Panel:  Recent Labs Lab 12/16/15 1532 12/17/15 0520 12/17/15 1457 12/18/15 0250 12/19/15 0553 12/21/15 0314  NA 134* 138  --  139 139 139  K 3.9 3.7  --  4.7 4.0 4.1  CL 106 106  --  102 105 106  CO2 20* 24  --  23 27 26   GLUCOSE 91 74  --  89 99 78  BUN 16 14  --  20 22* 22*  CREATININE 1.31* 1.29*  --  1.68* 1.84* 1.43*  CALCIUM 8.9 8.9  --  9.2 8.7* 8.3*  MG  --   --  1.7 1.7  --   --    GFR: Estimated Creatinine Clearance: 32.1 mL/min (by C-G formula based on SCr of 1.43 mg/dL).   Liver Function Tests:  Recent Labs Lab 12/16/15 1532  AST 45*  ALT 39  ALKPHOS 84  BILITOT 0.8  PROT 6.0*  ALBUMIN 3.4*   Coagulation Profile:  Recent Labs Lab 12/17/15 0121  INR 1.22   Cardiac Enzymes:  Recent Labs Lab 12/16/15 1945 12/17/15 0121 12/17/15 0520 12/17/15 1300  TROPONINI 0.18* 0.20* 0.21* 0.19*   CBG:  Recent Labs Lab 12/20/15 1633 12/20/15 2106 12/21/15 0602 12/21/15 1140 12/21/15 1703  GLUCAP 94 97 71 89 87   Urine analysis:    Component Value Date/Time   COLORURINE YELLOW  12/13/2015 1559   APPEARANCEUR CLEAR 12/13/2015 1559   LABSPEC 1.010 12/13/2015 1559   PHURINE 6.0 12/13/2015 1559   GLUCOSEU NEGATIVE 12/13/2015 1559   HGBUR NEGATIVE 12/13/2015 1559   HGBUR large 08/03/2008 1258   BILIRUBINUR NEGATIVE 12/13/2015 1559   BILIRUBINUR neg 04/09/2014 1056   KETONESUR NEGATIVE 12/13/2015 1559   PROTEINUR NEGATIVE 11/23/2015 1300   UROBILINOGEN 0.2 12/13/2015 1559   NITRITE NEGATIVE 12/13/2015 1559   LEUKOCYTESUR TRACE (A) 12/13/2015 1559     Barton Dubois M.D. [facility] 12/21/2015, 5:43 PM  Pager: [phone]

## 2015-12-21 NOTE — Progress Notes (Signed)
Received call from Leodis Sias, the daughter Russell [name] plans to take her father home at discharge with [facility] services; TCT Russell [name] to offer [facility] choices, she requested [place] also Quillian Quince with [facility] to do his physical therapy. Referral placed as ordered. Mindi Slicker [facility] [phone]

## 2015-12-21 NOTE — Progress Notes (Signed)
Pharmacy Antibiotic Note  Russell [name] is a 82 y.o. male admitted on 12/16/2015 with pneumonia.  Pharmacy was consulted for Levaquin dosing for UTI coverage from urine culture done on 8/15 with Pseudomonas and Enterococcus (both sens to Levaquin). This will provide respiratory coverage as well.   Plan: -Continue Levaquin to 500 mg PO q48h -Plan is for 7 days (through 8/25) - stop date placed by pharmacy -Signing off  Height: 5\' 7"  (170.2 cm) Weight: 152 lb 1.6 oz (69 kg) IBW/kg (Calculated) : 66.1  Temp (24hrs), Avg:98 F (36.7 C), Min:97.7 F (36.5 C), Max:98.5 F (36.9 C)   Recent Labs Lab 12/16/15 1532 12/17/15 0520 12/18/15 0250 12/19/15 0553 12/21/15 0314  WBC 3.7*  --   --   --   --   CREATININE 1.31* 1.29* 1.68* 1.84* 1.43*    Estimated Creatinine Clearance: 32.1 mL/min (by C-G formula based on SCr of 1.43 mg/dL).    No Known Allergies  Renold Genta, PharmD, BCPS Clinical Pharmacist Phone for today - [place] - [phone] 12/21/2015 11:54 AM

## 2015-12-21 NOTE — Progress Notes (Signed)
    Subjective:  Denies CP or dyspnea   Objective:  Vitals:   12/20/15 1704 12/20/15 2020 12/21/15 0504 12/21/15 0733  BP: 126/74 (!) 130/56 (!) 124/53 117/69  Pulse: 68 66 69 72  Resp:  20 20   Temp:  98.2 F (36.8 C) 98.5 F (36.9 C) 97.7 F (36.5 C)  TempSrc:  Oral Oral Oral  SpO2:  97% 97% 99%  Weight:   152 lb 1.6 oz (69 kg)   Height:        Intake/Output from previous day:  Intake/Output Summary (Last 24 hours) at 12/21/15 1107 Last data filed at 12/21/15 0914  Gross per 24 hour  Intake              480 ml  Output              450 ml  Net               30 ml    Physical Exam: Physical exam: Well-developed frail in no acute distress.  Skin is warm and dry.  HEENT is normal.  Neck is supple.  Chest with diminished BS Cardiovascular exam is regular rate and rhythm.  Abdominal exam nontender or distended. No masses palpated. Extremities show no edema. neuro grossly intact    Lab Results: Basic Metabolic Panel:  Recent Labs  12/19/15 0553 12/21/15 0314  NA 139 139  K 4.0 4.1  CL 105 106  CO2 27 26  GLUCOSE 99 78  BUN 22* 22*  CREATININE 1.84* 1.43*  CALCIUM 8.7* 8.3*     Assessment/Plan:  1 acute on chronic systolic congestive heart failure-pt appears to be euvolemic. Ejection fraction is 20%. He has had problems with orthostasis in the past. Would recommend continuing Lasix 20 mg daily at DC. Continue carvedilol 3.125 mg twice a day. Increase as outpt as tolerated but allow blood pressure to run higher given history of orthostasis. We'll hold on ACE inhibitor for now; consider as outpt once it is clear BP and renal function stable. Note apical thrombus cannot be excluded on echocardiogram. Would plan limited study with Definity prior to DC. Will reorder. 2 cardiomyopathy-plan as outlined in #1. 3 mild elevation in troponin-not diagnostic of acute coronary syndrome. Not a candidate for further aggressive cardiac evaluation. 4 acute kidney  injury-renal function stable this AM We will sign off; please call with questions or if thrombus noted on fu limited echo with definity.  Kirk Ruths 12/21/2015, 11:07 AM

## 2015-12-22 ENCOUNTER — Other Ambulatory Visit (HOSPITAL_COMMUNITY): Payer: Self-pay

## 2015-12-22 ENCOUNTER — Inpatient Hospital Stay (HOSPITAL_COMMUNITY): Payer: PPO

## 2015-12-22 DIAGNOSIS — I5021 Acute systolic (congestive) heart failure: Secondary | ICD-10-CM | POA: Diagnosis not present

## 2015-12-22 DIAGNOSIS — I349 Nonrheumatic mitral valve disorder, unspecified: Secondary | ICD-10-CM

## 2015-12-22 DIAGNOSIS — R5381 Other malaise: Secondary | ICD-10-CM | POA: Diagnosis not present

## 2015-12-22 DIAGNOSIS — N390 Urinary tract infection, site not specified: Secondary | ICD-10-CM | POA: Diagnosis not present

## 2015-12-22 DIAGNOSIS — I5043 Acute on chronic combined systolic (congestive) and diastolic (congestive) heart failure: Secondary | ICD-10-CM | POA: Diagnosis not present

## 2015-12-22 DIAGNOSIS — E119 Type 2 diabetes mellitus without complications: Secondary | ICD-10-CM | POA: Diagnosis not present

## 2015-12-22 DIAGNOSIS — R7989 Other specified abnormal findings of blood chemistry: Secondary | ICD-10-CM

## 2015-12-22 DIAGNOSIS — R778 Other specified abnormalities of plasma proteins: Secondary | ICD-10-CM

## 2015-12-22 DIAGNOSIS — N179 Acute kidney failure, unspecified: Secondary | ICD-10-CM | POA: Diagnosis not present

## 2015-12-22 LAB — BASIC METABOLIC PANEL
Anion gap: 6 (ref 5–15)
BUN: 23 mg/dL — AB (ref 6–20)
CALCIUM: 8.5 mg/dL — AB (ref 8.9–10.3)
CHLORIDE: 106 mmol/L (ref 101–111)
CO2: 25 mmol/L (ref 22–32)
CREATININE: 1.46 mg/dL — AB (ref 0.61–1.24)
GFR calc non Af Amer: 41 mL/min — ABNORMAL LOW (ref >60)
GFR, EST AFRICAN AMERICAN: 47 mL/min — AB (ref >60)
Glucose, Bld: 75 mg/dL (ref 65–99)
Potassium: 3.7 mmol/L (ref 3.5–5.1)
SODIUM: 137 mmol/L (ref 135–145)

## 2015-12-22 LAB — ECHOCARDIOGRAM LIMITED
HEIGHTINCHES: 67 in
WEIGHTICAEL: 2452.8 [oz_av]

## 2015-12-22 LAB — GLUCOSE, CAPILLARY
GLUCOSE-CAPILLARY: 67 mg/dL (ref 65–99)
Glucose-Capillary: 115 mg/dL — ABNORMAL HIGH (ref 65–99)
Glucose-Capillary: 102 mg/dL — ABNORMAL HIGH (ref 65–99)
Glucose-Capillary: 136 mg/dL — ABNORMAL HIGH (ref 65–99)

## 2015-12-22 MED ORDER — PERFLUTREN LIPID MICROSPHERE
1.0000 mL | INTRAVENOUS | Status: AC | PRN
  Administered 2015-12-22: 2 mL via INTRAVENOUS
  Filled 2015-12-22: qty 10

## 2015-12-22 MED ORDER — CARVEDILOL 3.125 MG PO TABS: 3.1250 mg | ORAL_TABLET | Freq: Two times a day (BID) | ORAL | 1 refills | Status: DC

## 2015-12-22 MED ORDER — LEVOFLOXACIN 500 MG PO TABS: 500.0000 mg | ORAL_TABLET | ORAL | 0 refills | Status: DC

## 2015-12-22 MED ORDER — PERFLUTREN LIPID MICROSPHERE
INTRAVENOUS | Status: AC
  Filled 2015-12-22: qty 10

## 2015-12-22 MED ORDER — FUROSEMIDE 20 MG PO TABS: 20.0000 mg | ORAL_TABLET | Freq: Every day | ORAL | 1 refills | Status: DC

## 2015-12-22 NOTE — Progress Notes (Signed)
  Echocardiogram 2D Echocardiogram limited with Definity has been performed.  Diamond Nickel 12/22/2015, 3:13 PM

## 2015-12-22 NOTE — Progress Notes (Signed)
[facility] d/c instructions explained and given to pt and his daughter at bedside.  Verbalized understanding.  D/c off floor to awaiting transport via w/c.    Karie Kirks, Therapist, sports.

## 2015-12-22 NOTE — Progress Notes (Addendum)
Patient's am blood sugar was 67. Patient remained asymptomatic. He was given OJ. When rechecked it was 115.   Also paged Md Dyann Kief that patient had 5 beats of Vtach.

## 2015-12-22 NOTE — Discharge Summary (Signed)
Physician Discharge Summary  Russell [name] [id number] DOB: [birth year] DOA: 12/16/2015  PCP: Scarlette Calico, MD  Admit date: 12/16/2015 Discharge date: 12/22/2015  Time spent: 35 minutes  Recommendations for Outpatient Follow-up:  1. Repeat BMET to follow electrolytes and renal function  2. Reassess BP and adjust medications as needed    Discharge Diagnoses:  Principal Problem:   Acute CHF (congestive heart failure) ([place]) Active Problems:   Cough   AKI (acute kidney injury) ([place])   Diabetes mellitus type 2, diet-controlled ([place])   UTI (lower urinary tract infection)   Acute on chronic congestive heart failure (HCC)   Physical deconditioning   Elevated troponin   Discharge Condition: stable and improved. Discharge home with [facility] services and family care. Patient will follow up with PCP in 10 days and with cardiology service as instructed.   Diet recommendation: heart healthy diet and modified carbohydrates   Filed Weights   12/20/15 0537 12/21/15 0504 12/22/15 0524  Weight: 69.6 kg (153 lb 6.4 oz) 69 kg (152 lb 1.6 oz) 69.5 kg (153 lb 4.8 oz)    History of present illness:  82 y.o.gentleman with a history of dementia, HTN, HLD, DM, prostate and colon cancer who presents to the ED with several of his children for evaluation of persistent cough (refractory to antibiotics and anti-tussives), orthopnea, and PND. The patient saw his PCP three days ago with similar complaints and was diagnosed with pneumonia as well as a UTI. He was given a prescription for omnicef. He has not improved. He has significant lower extremity edema. Shortness of breath is worse when lying flat. He has had symptoms of PND. The family does not check his weight daily and they do not follow any real dietary or fluid restrictions.  At baseline, he ambulates independently with a cane. ED Course:Chest xray suggestive of pulmonary edema. BNP greater than 2000. First troponin 0.18. He does not have an  oxygen requirement. He has been given lasix 80mg  IV x one. Of note, last echo in 2015 showed an EF of 42% with grade I diastolic dysfunction. The patient has not been on lasix at home. Family reports that the patient is followed by Dr. Caryl Comes of cardiology. Also, outpatient urine culture from earlier this week growing pseudomonas and enterococcus species.  Hospital Course:  Acute on chronic combined CHF/elevated troponin  - Patient presented with shortness of breath, chest x-ray suggestive of pulmonary edema, BNP 2189 - Serial troponins positive, however patient presented with acute on chronic CHF and denies CP. - Patient was initially on IV Lasix, however creatinine trended up to 1.6-1.8, Hence IV Lasix held. -Negative balance of 3.4 L, weight down from 160-> 153 lbs since admission - 2-D echo showed EF of 20% with diffuse hypokinesis, cardiology was consulted and recommended to continue Lasix 20 mg daily now, Coreg 3.125 mgBID and to hold on ACE/ARB until renal function improves. -no further work up while inpatient  -outpatient follow up and reevaluation of his EF -mild elevation on troponin from demand ischemia. -continue ASA and statins  Acute kidney injury -Improving and close to normal at discharge; likely due to UTI and diuresis  -will continue holding ARB and his HCTZ has been discontinued  -Follow BMET at follow up visit   Diabetes mellitus -Continue diet management at home - hemoglobin A1c 5.4   enterococcus, pseudomonas UTI -will continue levofloxacin and complete a total of  7 days -2 more dosages at discharge (base on culture sensitivity, using levofloxacin every 48 hours)  Anxiety/insomnia - Continue Xanax, as needed   Physical deconditioning -discharge home with [facility] services and family care  Procedures:  See below for x-ray reports  2-D echo - Left ventricle: LVEF is severely depressed with diffuse   hypokinesis; mid/distal inferior and posterior  akinesis and   apical akinesis. Wall thickness was increased in a pattern of   mild LVH. Systolic function was severely reduced. The estimated   ejection fraction was in the range of 20% to 25%. No Apical thrombus seen - Aortic valve: There was mild regurgitation. - Mitral valve: There was mild regurgitation. - Left atrium: The atrium was mildly dilated. - Right ventricle: The cavity size was mildly dilated. Systolic   function was moderately to severely reduced. - Pericardium, extracardiac: A small pericardial effusion was   identified.    Consultations:  Cardiology   Discharge Exam: Vitals:   12/22/15 1000 12/22/15 1154  BP: 118/61 122/68  Pulse: 71 73  Resp:  18  Temp:  97.8 F (36.6 C)    General: Alert and oriented, Afebrile, frail and in no distress. Denies CP and SOB.  HEENT/NECK:  Supple, no bruits, no JVD appreciated. Fair dentition, no thrush  Cardiovascular: S1 S2 clear, RRR  Respiratory:Decreased breath sounds at the bases, no frank crackles, no wheezing.  Gastrointestinal: Soft, nontender, nondistended, + bowel sounds  Ext: no cyanosis clubbing, trace to 1+ edema  Neuro: No new focal deficits, able to follow commands   Skin: No rashes  Psych: slight flat affect, but normal demeanor   Discharge Instructions   Discharge Instructions    Diet - low sodium heart healthy    Complete by:  As directed   Discharge instructions    Complete by:  As directed   Take medications as prescribed Keep yourself well hydrated Follow up with cardiology service as instructed Arrange follow up with PCP in 10 days   Increase activity slowly    Complete by:  As directed     Current Discharge Medication List    START taking these medications   Details  carvedilol (COREG) 3.125 MG tablet Take 1 tablet (3.125 mg total) by mouth 2 (two) times daily with a meal. Qty: 60 tablet, Refills: 1    furosemide (LASIX) 20 MG tablet Take 1 tablet (20 mg total) by mouth  daily. Qty: 30 tablet, Refills: 1    levofloxacin (LEVAQUIN) 500 MG tablet Take 1 tablet (500 mg total) by mouth every other day. Qty: 2 tablet, Refills: 0      CONTINUE these medications which have NOT CHANGED   Details  ALPRAZolam (XANAX) 0.5 MG tablet TAKE 1 TABLET BY MOUTH THREE TIMES DAILY AS NEEDED FOR ANXIETY Qty: 60 tablet, Refills: 3    aspirin 81 MG tablet Take 81 mg by mouth daily.      Azelastine-Fluticasone (DYMISTA) 137-50 MCG/ACT SUSP Place 1 Act into the nose 2 (two) times daily. Qty: 1 Bottle, Refills: 11   Associated Diagnoses: Other seasonal allergic rhinitis    B Complex Vitamins (VITAMIN-B COMPLEX PO) Take 1 tablet by mouth daily.     Cyanocobalamin (VITAMIN B-12 PO) Take 1 tablet by mouth daily with breakfast.    fesoterodine (TOVIAZ) 8 MG TB24 tablet Take 1 tablet (8 mg total) by mouth daily. Qty: 30 tablet, Refills: 11    Ginkgo Biloba (GINKGO PO) Take 1 capsule by mouth 2 (two) times daily.     HYDROcodone-acetaminophen (NORCO/VICODIN) 5-325 MG tablet Take 1 tablet by mouth every 6 (six) hours  as needed for moderate pain. Qty: 65 tablet, Refills: 0   Associated Diagnoses: Primary osteoarthritis involving multiple joints    Multiple Vitamin (MULTI-VITAMIN PO) Take 1 tablet by mouth daily.    polyethylene glycol (MIRALAX / GLYCOLAX) packet Take 17 g by mouth daily. Qty: 14 each, Refills: 11   Associated Diagnoses: Slow transit constipation    promethazine-dextromethorphan (PROMETHAZINE-DM) 6.25-15 MG/5ML syrup Take 5 mLs by mouth 4 (four) times daily as needed for cough. Qty: 118 mL, Refills: 0   Associated Diagnoses: Cough; CAP (community acquired pneumonia)    sertraline (ZOLOFT) 100 MG tablet TAKE 1 TABLET BY MOUTH DAILY Qty: 90 tablet, Refills: 2    simvastatin (ZOCOR) 80 MG tablet Take 40 mg by mouth daily at 6 PM.  Refills: 3      STOP taking these medications     cefdinir (OMNICEF) 300 MG capsule      hydrochlorothiazide (HYDRODIURIL)  25 MG tablet      ibuprofen (ADVIL,MOTRIN) 200 MG tablet      losartan (COZAAR) 50 MG tablet        No Known Allergies Follow-up Information    [facility] .   Why:  They will do your home health care at your home Contact information: [address] [place] [address] [phone]        Scarlette Calico, MD. Schedule an appointment as soon as possible for a visit in 10 day(s).   Specialty:  Internal Medicine Contact information: [address] [address] [phone]           The results of significant diagnostics from this hospitalization (including imaging, microbiology, ancillary and laboratory) are listed below for reference.    Significant Diagnostic Studies: Dg Chest 2 View  Result Date: 12/16/2015 CLINICAL DATA:  Shortness of breath and congestion.  Pneumonia. EXAM: CHEST  2 VIEW COMPARISON:  Two-view chest x-ray 12/13/2015 FINDINGS: The heart is enlarged. Bilateral pleural effusions have increased. Pulmonary vascular congestion is present. Bibasilar airspace disease has increased as well. Atherosclerotic calcifications are present at the aortic arch. Degenerate changes are noted at both shoulders. The visualized soft tissues and bony thorax are unremarkable. IMPRESSION: 1. Cardiomegaly with increasing bilateral pleural effusions and bibasilar airspace disease. This is most concerning for congestive heart failure. 2. Increasing pulmonary vascular congestion. 3. Bibasilar airspace disease likely reflects atelectasis. Early infection is not excluded. 4. Atherosclerosis of the thoracic aorta. Electronically Signed   By: San Morelle M.D.   On: 12/16/2015 17:19   Dg Chest 2 View  Result Date: 12/13/2015 CLINICAL DATA:  Cough, chest congestion, shortness of breath, and chest pain for the past 2 days; history of hypertension diabetes. History of CHF, community-acquired pneumonia, colonic and prostate malignancy.  Remote history of smoking. EXAM: CHEST  2 VIEW COMPARISON:  PA and lateral chest x-ray of November 23, 2015 FINDINGS: The lungs are adequately inflated. There is bibasilar atelectasis or infiltrate with small bilateral pleural effusions more conspicuous than on the previous study. The cardiac silhouette remains enlarged. The pulmonary vascularity is mildly engorged but less conspicuous than on the previous study. There is calcification in the wall of the aortic arch. The observed bony thorax exhibits no acute abnormality. There is degenerative change of both shoulders. IMPRESSION: New bibasilar atelectasis or pneumonia is present. Persistent mild CHF with small bilateral pleural effusions. Followup PA and lateral chest X-ray is recommended in 3-4 weeks following trial of antibiotic therapy to ensure resolution and exclude  underlying malignancy. Aortic atherosclerosis. Electronically Signed   By: David  Martinique M.D.   On: 12/13/2015 16:20   Dg Chest 2 View  Result Date: 11/23/2015 CLINICAL DATA:  Cough and weakness for 1 week. EXAM: CHEST  2 VIEW COMPARISON:  PA and lateral chest 09/29/2015 and 04/06/2015. FINDINGS: The patient has small bilateral pleural effusions. There is marked cardiomegaly and pulmonary vascular congestion. Aortic atherosclerosis is noted. IMPRESSION: Cardiomegaly and pulmonary vascular congestion with small bilateral pleural effusions. Atherosclerosis. Electronically Signed   By: Inge Rise M.D.   On: 11/23/2015 13:31  Ct Head Wo Contrast  Result Date: 11/23/2015 CLINICAL DATA:  Generalized weakness since yesterday. EXAM: CT HEAD WITHOUT CONTRAST TECHNIQUE: Contiguous axial images were obtained from the base of the skull through the vertex without intravenous contrast. COMPARISON:  03/08/2005 FINDINGS: There is no evidence for acute hemorrhage, hydrocephalus, mass lesion, or abnormal extra-axial fluid collection. No definite CT evidence for acute infarction. Diffuse loss of parenchymal  volume is consistent with atrophy. Patchy low attenuation in the deep hemispheric and periventricular white matter is nonspecific, but likely reflects chronic microvascular ischemic demyelination. The visualized paranasal sinuses and mastoid air cells are clear. IMPRESSION: 1. Stable.  No acute intracranial abnormality. 2. Atrophy with chronic small vessel white matter ischemic demyelination. Electronically Signed   By: Misty Stanley M.D.   On: 11/23/2015 14:15   Microbiology: Recent Results (from the past 240 hour(s))  CULTURE, URINE COMPREHENSIVE     Status: None   Collection Time: 12/13/15  3:59 PM  Result Value Ref Range Status   Culture PSEUDOMONAS AERUGINOSA ENTEROCOCCUS SPECIES   Final   Colony Count 10,000-50,000 CFU/mL  Final   Colony Count 10,000-50,000 CFU/mL  Final   Organism ID, Bacteria PSEUDOMONAS AERUGINOSA  Final   Colony Count 10,000-50,000 CFU/mL  Final   Organism ID, Bacteria ENTEROCOCCUS SPECIES  Final      Susceptibility   Enterococcus species -  (no method available)    AMPICILLIN <=2 Sensitive     LEVOFLOXACIN 1 Sensitive     NITROFURANTOIN <=16 Sensitive     VANCOMYCIN 2 Sensitive     TETRACYCLINE >=16 Resistant    Pseudomonas aeruginosa -  (no method available)    PIP/TAZO 8 Sensitive     IMIPENEM >=16 Resistant     CEFTRIAXONE  Resistant     CEFTAZIDIME 4 Sensitive     CEFEPIME <=1 Sensitive     GENTAMICIN <=1 Sensitive     TOBRAMYCIN <=1 Sensitive     CIPROFLOXACIN <=0.25 Sensitive     LEVOFLOXACIN 1 Sensitive      Labs: Basic Metabolic Panel:  Recent Labs Lab 12/17/15 0520 12/17/15 1457 12/18/15 0250 12/19/15 0553 12/21/15 0314 12/22/15 0615  NA 138  --  139 139 139 137  K 3.7  --  4.7 4.0 4.1 3.7  CL 106  --  102 105 106 106  CO2 24  --  23 27 26 25   GLUCOSE 74  --  89 99 78 75  BUN 14  --  20 22* 22* 23*  CREATININE 1.29*  --  1.68* 1.84* 1.43* 1.46*  CALCIUM 8.9  --  9.2 8.7* 8.3* 8.5*  MG  --  1.7 1.7  --   --   --    Liver  Function Tests:  Recent Labs Lab 12/16/15 1532  AST 45*  ALT 39  ALKPHOS 84  BILITOT 0.8  PROT 6.0*  ALBUMIN 3.4*   CBC:  Recent Labs Lab 12/16/15  1532  WBC 3.7*  NEUTROABS 2.1  HGB 12.5*  HCT 36.9*  MCV 87.2  PLT 214   Cardiac Enzymes:  Recent Labs Lab 12/16/15 1945 12/17/15 0121 12/17/15 0520 12/17/15 1300  TROPONINI 0.18* 0.20* 0.21* 0.19*   BNP: BNP (last 3 results)  Recent Labs  12/16/15 1532  BNP 2,189.3*    CBG:  Recent Labs Lab 12/21/15 1703 12/21/15 2200 12/22/15 0636 12/22/15 0736 12/22/15 1129  GLUCAP 87 79 67 115* 102*    Signed:  Barton Dubois MD.  [facility] 12/22/2015, 4:30 PM

## 2015-12-22 NOTE — Progress Notes (Signed)
PT Cancellation Note  Patient Details Name: Russell [name] MRN: [id number] DOB: 1924/04/02   Cancelled Treatment:    Reason Eval/Treat Not Completed: Patient at procedure or test/unavailable.  Echocardiogram and will try later as time and pt allow.   Ramond Dial 12/22/2015, 2:45 PM    Mee Hives, PT MS Acute Rehab Dept. Number: [place]

## 2015-12-26 DIAGNOSIS — Z8546 Personal history of malignant neoplasm of prostate: Secondary | ICD-10-CM | POA: Diagnosis not present

## 2015-12-26 DIAGNOSIS — I110 Hypertensive heart disease with heart failure: Secondary | ICD-10-CM | POA: Diagnosis not present

## 2015-12-26 DIAGNOSIS — Z7982 Long term (current) use of aspirin: Secondary | ICD-10-CM | POA: Diagnosis not present

## 2015-12-26 DIAGNOSIS — I5023 Acute on chronic systolic (congestive) heart failure: Secondary | ICD-10-CM | POA: Diagnosis not present

## 2015-12-26 DIAGNOSIS — Z85038 Personal history of other malignant neoplasm of large intestine: Secondary | ICD-10-CM | POA: Diagnosis not present

## 2015-12-26 DIAGNOSIS — J189 Pneumonia, unspecified organism: Secondary | ICD-10-CM | POA: Diagnosis not present

## 2015-12-26 DIAGNOSIS — N390 Urinary tract infection, site not specified: Secondary | ICD-10-CM | POA: Diagnosis not present

## 2015-12-26 DIAGNOSIS — N179 Acute kidney failure, unspecified: Secondary | ICD-10-CM | POA: Diagnosis not present

## 2015-12-27 ENCOUNTER — Other Ambulatory Visit: Payer: Self-pay

## 2015-12-27 NOTE — Patient Outreach (Signed)
[facility]) Care Management  12/27/2015  Russell [name] 06-Oct-1923 [id number]       EMMI-HF RED ON EMMI ALERT Day # 3 Date: 12/26/15 Red Alert Reason: " Weighed themselves today? No   New/worsening problems? Yes  New/worsenning SOB? Yes"   Outreach attempt #1 to patient. No answer. RN CM left HIPAA compliant voicemail message along with contact info.    Plan: RN CM will make outreach attempt to patient within one business day if no return call from patient.    Enzo Montgomery, RN,BSN,CCM [place] Management Telephonic Care Management Coordinator Direct Phone: [phone] Toll Free: [phone] Fax: [phone]

## 2015-12-28 ENCOUNTER — Telehealth: Payer: Self-pay | Admitting: Emergency Medicine

## 2015-12-28 ENCOUNTER — Other Ambulatory Visit: Payer: Self-pay

## 2015-12-28 NOTE — Telephone Encounter (Signed)
[facility] called and needs verbal orders for PT- 1 time a week for 4 weeks. Its for balance, strength, gait and endurance. Please advise thanks.

## 2015-12-28 NOTE — Telephone Encounter (Signed)
FYI:   Left detailed message with verbal okay.

## 2015-12-28 NOTE — Patient Outreach (Signed)
[facility]) Care Management  12/28/2015  Russell [name] Jan 18, 1924 [id number]   EMMI-HF RED ON EMMI ALERT Day # 3 Date: 12/26/15 Red Alert Reason: " Weighed themselves today? No   New/worsening problems? Yes  New/worsenning SOB? Yes"    Outreach attempt #2 to patient. No answer at present and unable to leave message.    Plan: RN CM will send unsuccessful outreach letter to patient and close case if no response from patient within 10 business days.   Enzo Montgomery, RN,BSN,CCM [place] Management Telephonic Care Management Coordinator Direct Phone: [phone] Toll Free: [phone] Fax: [phone]

## 2015-12-29 ENCOUNTER — Ambulatory Visit (INDEPENDENT_AMBULATORY_CARE_PROVIDER_SITE_OTHER): Payer: PPO | Admitting: Internal Medicine

## 2015-12-29 ENCOUNTER — Telehealth: Payer: Self-pay | Admitting: Internal Medicine

## 2015-12-29 ENCOUNTER — Encounter: Payer: Self-pay | Admitting: Internal Medicine

## 2015-12-29 VITALS — BP 112/60 | HR 68 | Temp 97.6°F | Resp 16 | Ht 67.0 in | Wt 153.5 lb

## 2015-12-29 DIAGNOSIS — R059 Cough, unspecified: Secondary | ICD-10-CM

## 2015-12-29 DIAGNOSIS — R05 Cough: Secondary | ICD-10-CM | POA: Diagnosis not present

## 2015-12-29 DIAGNOSIS — I5041 Acute combined systolic (congestive) and diastolic (congestive) heart failure: Secondary | ICD-10-CM

## 2015-12-29 DIAGNOSIS — Z23 Encounter for immunization: Secondary | ICD-10-CM

## 2015-12-29 DIAGNOSIS — M159 Polyosteoarthritis, unspecified: Secondary | ICD-10-CM

## 2015-12-29 DIAGNOSIS — F0390 Unspecified dementia without behavioral disturbance: Secondary | ICD-10-CM | POA: Diagnosis not present

## 2015-12-29 DIAGNOSIS — J189 Pneumonia, unspecified organism: Secondary | ICD-10-CM

## 2015-12-29 DIAGNOSIS — F03A0 Unspecified dementia, mild, without behavioral disturbance, psychotic disturbance, mood disturbance, and anxiety: Secondary | ICD-10-CM

## 2015-12-29 DIAGNOSIS — M150 Primary generalized (osteo)arthritis: Secondary | ICD-10-CM

## 2015-12-29 MED ORDER — HYDROCODONE-ACETAMINOPHEN 5-325 MG PO TABS: 1.0000 | ORAL_TABLET | Freq: Four times a day (QID) | ORAL | 0 refills | Status: DC | PRN

## 2015-12-29 MED ORDER — PROMETHAZINE-DM 6.25-15 MG/5ML PO SYRP: 5.0000 mL | ORAL_SOLUTION | Freq: Four times a day (QID) | ORAL | 2 refills | Status: DC | PRN

## 2015-12-29 NOTE — Progress Notes (Signed)
Subjective:  Patient ID: Russell [name], male    DOB: Sep 20, 1923  Age: 82 y.o. MRN: [id number]  CC: Congestive Heart Failure   HPI Russell [name] presents for follow-up after recent admission for the combination of congestive heart failure and pneumonia. His daughter is with him today and she tells me that she feels overwhelmed with taking care of him. She says he has quite a few symptoms and most of them occur at night with orthopnea, wheezing, nonproductive cough. He has taken Phenergan DM for the cough and reports that it has helped some. There have been no fever, chills, hemoptysis, weight loss, nausea, vomiting, or loss of appetite.  No facility-administered medications prior to visit.    Outpatient Medications Prior to Visit  Medication Sig Dispense Refill  . ALPRAZolam (XANAX) 0.5 MG tablet TAKE 1 TABLET BY MOUTH THREE TIMES DAILY AS NEEDED FOR ANXIETY (Patient taking differently: TAKE 1 TABLET EVERY MORNING) 60 tablet 3  . aspirin 81 MG tablet Take 81 mg by mouth daily.      . Azelastine-Fluticasone (DYMISTA) 137-50 MCG/ACT SUSP Place 1 Act into the nose 2 (two) times daily. (Patient taking differently: Place 1 spray into both nostrils 2 (two) times daily as needed (for allergies). ) 1 Bottle 11  . carvedilol (COREG) 3.125 MG tablet Take 1 tablet (3.125 mg total) by mouth 2 (two) times daily with a meal. 60 tablet 1  . Cyanocobalamin (VITAMIN B-12 PO) Take 1 tablet by mouth daily with breakfast.    . fesoterodine (TOVIAZ) 8 MG TB24 tablet Take 1 tablet (8 mg total) by mouth daily. 30 tablet 11  . furosemide (LASIX) 20 MG tablet Take 1 tablet (20 mg total) by mouth daily. 30 tablet 1  . polyethylene glycol (MIRALAX / GLYCOLAX) packet Take 17 g by mouth daily. (Patient taking differently: Take 17 g by mouth daily. MIX AND DRINK) 14 each 11  . sertraline (ZOLOFT) 100 MG tablet TAKE 1 TABLET BY MOUTH DAILY (Patient taking differently: Take 100 mg by mouth once daily) 90 tablet 2  .  simvastatin (ZOCOR) 80 MG tablet Take 40 mg by mouth daily at 6 PM.   3  . B Complex Vitamins (VITAMIN-B COMPLEX PO) Take 1 tablet by mouth daily.     . Ginkgo Biloba (GINKGO PO) Take 1 capsule by mouth 2 (two) times daily.     Marland Kitchen HYDROcodone-acetaminophen (NORCO/VICODIN) 5-325 MG tablet Take 1 tablet by mouth every 6 (six) hours as needed for moderate pain. 65 tablet 0  . levofloxacin (LEVAQUIN) 500 MG tablet Take 1 tablet (500 mg total) by mouth every other day. 2 tablet 0  . Multiple Vitamin (MULTI-VITAMIN PO) Take 1 tablet by mouth daily.    . promethazine-dextromethorphan (PROMETHAZINE-DM) 6.25-15 MG/5ML syrup Take 5 mLs by mouth 4 (four) times daily as needed for cough. 118 mL 0    ROS Review of Systems  Constitutional: Positive for fatigue. Negative for activity change, appetite change, chills, diaphoresis, fever and unexpected weight change.  HENT: Negative.  Negative for sinus pressure and trouble swallowing.   Eyes: Negative.  Negative for visual disturbance.  Respiratory: Positive for cough, shortness of breath and wheezing.   Cardiovascular: Negative.  Negative for chest pain, palpitations and leg swelling.  Gastrointestinal: Negative for abdominal pain, constipation, diarrhea, nausea and vomiting.  Endocrine: Negative.   Genitourinary: Negative.  Negative for difficulty urinating.  Musculoskeletal: Positive for arthralgias. Negative for gait problem and myalgias.  Skin: Negative for color change and rash.  Allergic/Immunologic: Negative.   Neurological: Negative.  Negative for dizziness, tremors, weakness, light-headedness and numbness.  Hematological: Negative.  Negative for adenopathy. Does not bruise/bleed easily.  Psychiatric/Behavioral: Negative.     Objective:  BP 112/60 (BP Location: Left Arm, Patient Position: Sitting, Cuff Size: Normal)   Pulse 68   Temp 97.6 F (36.4 C) (Oral)   Ht 5\' 7"  (1.702 m)   Wt 153 lb 8 oz (69.6 kg)   SpO2 98%   BMI 24.04 kg/m   BP  Readings from Last 3 Encounters:  01/01/16 (!) 110/52  12/29/15 112/60  12/22/15 122/68    Wt Readings from Last 3 Encounters:  01/01/16 151 lb 9.6 oz (68.8 kg)  12/29/15 153 lb 8 oz (69.6 kg)  12/22/15 153 lb 4.8 oz (69.5 kg)    Physical Exam  Constitutional: He is oriented to person, place, and time. No distress.  HENT:  Mouth/Throat: Oropharynx is clear and moist. No oropharyngeal exudate.  Eyes: Conjunctivae are normal. Right eye exhibits no discharge. Left eye exhibits no discharge. No scleral icterus.  Neck: Normal range of motion. Neck supple. No JVD present. No tracheal deviation present. No thyromegaly present.  Cardiovascular: Normal rate, regular rhythm, normal heart sounds and intact distal pulses.  Exam reveals no gallop and no friction rub.   No murmur heard. Pulmonary/Chest: Effort normal. No accessory muscle usage or stridor. No tachypnea. No respiratory distress. He has decreased breath sounds in the right middle field, the right lower field, the left middle field and the left lower field. He has no wheezes. He has no rhonchi. He has no rales. He exhibits no tenderness.  Abdominal: Soft. He exhibits no distension and no mass. There is no tenderness. There is no rebound and no guarding.  Musculoskeletal: Normal range of motion. He exhibits no edema, tenderness or deformity.  Lymphadenopathy:    He has no cervical adenopathy.  Neurological: He is oriented to person, place, and time.  Skin: Skin is warm and dry. No rash noted. He is not diaphoretic. No erythema. No pallor.  Psychiatric: Judgment normal. His mood appears not anxious. His speech is delayed and tangential. His speech is not rapid and/or pressured. He is slowed and withdrawn. Cognition and memory are impaired. He does not exhibit a depressed mood. He expresses no homicidal and no suicidal ideation. He expresses no suicidal plans and no homicidal plans. He exhibits abnormal recent memory and abnormal remote  memory.  Vitals reviewed.   Lab Results  Component Value Date   WBC 4.2 12/31/2015   HGB 13.7 12/31/2015   HCT 41.5 12/31/2015   PLT 180 12/31/2015   GLUCOSE 163 (H) 12/31/2015   CHOL 124 01/25/2015   TRIG 74.0 01/25/2015   HDL 46.50 01/25/2015   LDLCALC 63 01/25/2015   ALT 29 12/31/2015   AST 52 (H) 12/31/2015   NA 133 (L) 12/31/2015   K 4.6 12/31/2015   CL 104 12/31/2015   CREATININE 1.22 12/31/2015   BUN 23 (H) 12/31/2015   CO2 22 12/31/2015   TSH 1.86 01/25/2015   PSA 1.94 02/20/2013   INR 1.22 12/17/2015   HGBA1C 5.4 12/17/2015   MICROALBUR 2.2 (H) 01/25/2015    Dg Chest 2 View  Result Date: 12/16/2015 CLINICAL DATA:  Shortness of breath and congestion.  Pneumonia. EXAM: CHEST  2 VIEW COMPARISON:  Two-view chest x-ray 12/13/2015 FINDINGS: The heart is enlarged. Bilateral pleural effusions have increased. Pulmonary vascular congestion is present. Bibasilar airspace disease has increased as well. Atherosclerotic  calcifications are present at the aortic arch. Degenerate changes are noted at both shoulders. The visualized soft tissues and bony thorax are unremarkable. IMPRESSION: 1. Cardiomegaly with increasing bilateral pleural effusions and bibasilar airspace disease. This is most concerning for congestive heart failure. 2. Increasing pulmonary vascular congestion. 3. Bibasilar airspace disease likely reflects atelectasis. Early infection is not excluded. 4. Atherosclerosis of the thoracic aorta. Electronically Signed   By: San Morelle M.D.   On: 12/16/2015 17:19    Assessment & Plan:   Donat was seen today for congestive heart failure.  Diagnoses and all orders for this visit:  Acute combined systolic and diastolic heart failure - EF 42% on echo 12/2013- medical therapy- I think hospice would be a good option for the patient and his family and that his situation is appropriate for hospice intervention so absent a referral. For now will continue Phenergan DM as  needed for symptom relief and he will continue to take the diuretics and beta blocker that were prescribed during the recent admission. -     Ambulatory referral to Hospice -     promethazine-dextromethorphan (PROMETHAZINE-DM) 6.25-15 MG/5ML syrup; Take 5 mLs by mouth 4 (four) times daily as needed for cough.  Mild dementia -     Ambulatory referral to Hospice  Cough -     promethazine-dextromethorphan (PROMETHAZINE-DM) 6.25-15 MG/5ML syrup; Take 5 mLs by mouth 4 (four) times daily as needed for cough.  CAP (community acquired pneumonia)- this has been adequately treated.  Primary osteoarthritis involving multiple joints -     HYDROcodone-acetaminophen (NORCO/VICODIN) 5-325 MG tablet; Take 1 tablet by mouth every 6 (six) hours as needed for moderate pain.  Need for prophylactic vaccination and inoculation against influenza -     Flu vaccine HIGH DOSE PF (Fluzone High dose)  Need for shingles vaccine -     Varicella-zoster vaccine subcutaneous   I have discontinued Mr. Wimpy B Complex Vitamins (VITAMIN-B COMPLEX PO), Multiple Vitamin (MULTI-VITAMIN PO), Ginkgo Biloba (GINKGO PO), levofloxacin, and cefdinir. I am also having him maintain his aspirin, fesoterodine, sertraline, simvastatin, ALPRAZolam, Azelastine-Fluticasone, polyethylene glycol, Cyanocobalamin (VITAMIN B-12 PO), carvedilol, furosemide, promethazine-dextromethorphan, and HYDROcodone-acetaminophen.  Meds ordered this encounter  Medications  . DISCONTD: losartan (COZAAR) 50 MG tablet  . DISCONTD: cefdinir (OMNICEF) 300 MG capsule  . promethazine-dextromethorphan (PROMETHAZINE-DM) 6.25-15 MG/5ML syrup    Sig: Take 5 mLs by mouth 4 (four) times daily as needed for cough.    Dispense:  118 mL    Refill:  2  . HYDROcodone-acetaminophen (NORCO/VICODIN) 5-325 MG tablet    Sig: Take 1 tablet by mouth every 6 (six) hours as needed for moderate pain.    Dispense:  90 tablet    Refill:  0     Follow-up: Return if symptoms  worsen or fail to improve.  Scarlette Calico, MD

## 2015-12-29 NOTE — Telephone Encounter (Signed)
PLEASE NOTE: All timestamps contained within this report are represented as [place] Standard Time. CONFIDENTIALTY NOTICE: This fax transmission is intended only for the addressee. It contains information that is legally privileged, confidential or otherwise protected from use or disclosure. If you are not the intended recipient, you are strictly prohibited from reviewing, disclosing, copying using or disseminating any of this information or taking any action in reliance on or regarding this information. If you have received this fax in error, please notify us immediately by telephone so that we can arrange for its return to [place]. Phone: [phone], Toll-Free: [phone], Fax: [phone] Page: 1 of 1 Call Id: [id number] [facility] - Client [place] Patient Name: Russell [name] DOB: [birth year] Initial Comment Caller states her Dad just got out of the hospital for pneumonia, wheezing and breathing hard. Coughing. Nurse Assessment Nurse: Dimas Chyle, RN, Dellis Filbert Date/Time Eilene Ghazi Time): 12/29/2015 10:42:06 AM Confirm and document reason for call. If symptomatic, describe symptoms. You must click the next button to save text entered. ---Caller states her Dad just got out of the hospital for pneumonia, wheezing and breathing hard. Coughing. Discharged from hospital on Friday. Has the patient traveled out of the country within the last 30 days? ---No Does the patient have any new or worsening symptoms? ---Yes Will a triage be completed? ---Yes Related visit to physician within the last 2 weeks? ---Yes Does the PT have any chronic conditions? (i.e. diabetes, asthma, etc.) ---Yes List chronic conditions. ---HTN, Diabetes type 2, CHF Is this a behavioral health or substance abuse call? ---No Guidelines Guideline Title Affirmed Question Affirmed Notes Pneumonia on Antibiotic Post- Hospitalization Follow-up Call [1] MILD difficulty  breathing (e.g., minimal/no SOB at rest, SOB with walking, pulse <100) AND [2] worse than when discharged from hospital Final Disposition User See Physician within [place], RN, [employer] Referrals REFERRED TO PCP OFFICE Disagree/Comply: Leta Baptist

## 2015-12-29 NOTE — Patient Instructions (Signed)

## 2015-12-29 NOTE — Progress Notes (Signed)
Pre visit review using our clinic review tool, if applicable. No additional management support is needed unless otherwise documented below in the visit note. 

## 2015-12-31 ENCOUNTER — Inpatient Hospital Stay (HOSPITAL_COMMUNITY)
Admission: EM | Admit: 2015-12-31 | Discharge: 2016-01-04 | DRG: 291 | Disposition: A | Discharge status: Home Health | Payer: PPO | Attending: Internal Medicine | Admitting: Internal Medicine

## 2015-12-31 ENCOUNTER — Encounter (HOSPITAL_COMMUNITY): Payer: Self-pay | Admitting: Emergency Medicine

## 2015-12-31 DIAGNOSIS — Z923 Personal history of irradiation: Secondary | ICD-10-CM | POA: Diagnosis not present

## 2015-12-31 DIAGNOSIS — Z7982 Long term (current) use of aspirin: Secondary | ICD-10-CM | POA: Diagnosis not present

## 2015-12-31 DIAGNOSIS — F418 Other specified anxiety disorders: Secondary | ICD-10-CM | POA: Diagnosis not present

## 2015-12-31 DIAGNOSIS — E871 Hypo-osmolality and hyponatremia: Secondary | ICD-10-CM | POA: Diagnosis present

## 2015-12-31 DIAGNOSIS — N3281 Overactive bladder: Secondary | ICD-10-CM | POA: Diagnosis not present

## 2015-12-31 DIAGNOSIS — E1122 Type 2 diabetes mellitus with diabetic chronic kidney disease: Secondary | ICD-10-CM | POA: Diagnosis not present

## 2015-12-31 DIAGNOSIS — I5023 Acute on chronic systolic (congestive) heart failure: Secondary | ICD-10-CM | POA: Diagnosis not present

## 2015-12-31 DIAGNOSIS — E876 Hypokalemia: Secondary | ICD-10-CM | POA: Diagnosis present

## 2015-12-31 DIAGNOSIS — N179 Acute kidney failure, unspecified: Secondary | ICD-10-CM | POA: Diagnosis not present

## 2015-12-31 DIAGNOSIS — J90 Pleural effusion, not elsewhere classified: Secondary | ICD-10-CM

## 2015-12-31 DIAGNOSIS — I509 Heart failure, unspecified: Secondary | ICD-10-CM

## 2015-12-31 DIAGNOSIS — J9811 Atelectasis: Secondary | ICD-10-CM | POA: Diagnosis not present

## 2015-12-31 DIAGNOSIS — Z8505 Personal history of malignant neoplasm of liver: Secondary | ICD-10-CM | POA: Diagnosis not present

## 2015-12-31 DIAGNOSIS — I5022 Chronic systolic (congestive) heart failure: Secondary | ICD-10-CM | POA: Diagnosis present

## 2015-12-31 DIAGNOSIS — I130 Hypertensive heart and chronic kidney disease with heart failure and stage 1 through stage 4 chronic kidney disease, or unspecified chronic kidney disease: Principal | ICD-10-CM | POA: Diagnosis present

## 2015-12-31 DIAGNOSIS — E785 Hyperlipidemia, unspecified: Secondary | ICD-10-CM | POA: Diagnosis present

## 2015-12-31 DIAGNOSIS — Z85038 Personal history of other malignant neoplasm of large intestine: Secondary | ICD-10-CM | POA: Diagnosis not present

## 2015-12-31 DIAGNOSIS — Z87891 Personal history of nicotine dependence: Secondary | ICD-10-CM | POA: Diagnosis not present

## 2015-12-31 DIAGNOSIS — Z9842 Cataract extraction status, left eye: Secondary | ICD-10-CM

## 2015-12-31 DIAGNOSIS — R7989 Other specified abnormal findings of blood chemistry: Secondary | ICD-10-CM | POA: Diagnosis not present

## 2015-12-31 DIAGNOSIS — Z8546 Personal history of malignant neoplasm of prostate: Secondary | ICD-10-CM | POA: Diagnosis not present

## 2015-12-31 DIAGNOSIS — F0390 Unspecified dementia without behavioral disturbance: Secondary | ICD-10-CM | POA: Diagnosis present

## 2015-12-31 DIAGNOSIS — Z9841 Cataract extraction status, right eye: Secondary | ICD-10-CM

## 2015-12-31 DIAGNOSIS — N184 Chronic kidney disease, stage 4 (severe): Secondary | ICD-10-CM | POA: Diagnosis present

## 2015-12-31 DIAGNOSIS — R0602 Shortness of breath: Secondary | ICD-10-CM | POA: Diagnosis not present

## 2015-12-31 DIAGNOSIS — E119 Type 2 diabetes mellitus without complications: Secondary | ICD-10-CM

## 2015-12-31 DIAGNOSIS — I700 Atherosclerosis of aorta: Secondary | ICD-10-CM | POA: Diagnosis present

## 2015-12-31 DIAGNOSIS — F03A0 Unspecified dementia, mild, without behavioral disturbance, psychotic disturbance, mood disturbance, and anxiety: Secondary | ICD-10-CM

## 2015-12-31 DIAGNOSIS — I5043 Acute on chronic combined systolic (congestive) and diastolic (congestive) heart failure: Secondary | ICD-10-CM | POA: Diagnosis present

## 2015-12-31 DIAGNOSIS — N183 Chronic kidney disease, stage 3 unspecified: Secondary | ICD-10-CM | POA: Diagnosis present

## 2015-12-31 DIAGNOSIS — I10 Essential (primary) hypertension: Secondary | ICD-10-CM | POA: Diagnosis present

## 2015-12-31 DIAGNOSIS — R778 Other specified abnormalities of plasma proteins: Secondary | ICD-10-CM | POA: Diagnosis present

## 2015-12-31 DIAGNOSIS — I110 Hypertensive heart disease with heart failure: Secondary | ICD-10-CM | POA: Diagnosis not present

## 2015-12-31 LAB — CBC WITH DIFFERENTIAL/PLATELET
BASOS PCT: 0 %
Basophils Absolute: 0 10*3/uL (ref 0.0–0.1)
EOS ABS: 0.2 10*3/uL (ref 0.0–0.7)
Eosinophils Relative: 6 %
HCT: 41.5 % (ref 39.0–52.0)
Hemoglobin: 13.7 g/dL (ref 13.0–17.0)
Lymphocytes Relative: 20 %
Lymphs Abs: 0.8 10*3/uL (ref 0.7–4.0)
MCH: 29 pg (ref 26.0–34.0)
MCHC: 33 g/dL (ref 30.0–36.0)
MCV: 87.7 fL (ref 78.0–100.0)
MONO ABS: 0.4 10*3/uL (ref 0.1–1.0)
MONOS PCT: 10 %
Neutro Abs: 2.7 10*3/uL (ref 1.7–7.7)
Neutrophils Relative %: 64 %
Platelets: 180 10*3/uL (ref 150–400)
RBC: 4.73 MIL/uL (ref 4.22–5.81)
RDW: 15.2 % (ref 11.5–15.5)
WBC: 4.2 10*3/uL (ref 4.0–10.5)

## 2015-12-31 NOTE — ED Triage Notes (Signed)
Pt arrived to ED via POV, complaining of SOB, wheezing.  Pt denies any pain, he was just discharged from [facility] after being diagnosed w/ pneumonia, SOB.  Family reports he has a "low heart rate" and CHF.

## 2015-12-31 NOTE — ED Provider Notes (Signed)
[facility] DEPT Provider Note   CSN: [id number] Arrival date & time: 12/31/15  2332  By signing my name below, I, Soijett Blue, attest that this documentation has been prepared under the direction and in the presence of Carmin Muskrat, MD. Electronically Signed: Soijett Blue, ED Scribe. 12/31/15. 12:28 AM.    History   Chief Complaint Chief Complaint  Patient presents with  . Shortness of Breath    HPI Russell [name] is a 82 y.o. male with a medical hx of CHF, HTN, DM, prostate CA, liver CA, hyperlipidemia, who presents to the Emergency Department complaining of SOB onset tonight. Family reports that the pt was recently dx with pneumonia and admitted for his symptoms on 12/16/15. Family states that the pt symptoms are worse at nighttime and since he has been discharged from the hospital he has had increased work of breathing and wheezing. Pt denies any pain at this time. Pt is having associated symptoms of wheezing and increased work of breathing. He notes that he has not tried any medications for the relief of his symptoms. He denies CP, abdominal pain, and any other symptoms.     The history is provided by the patient. No language interpreter was used.    Past Medical History:  Diagnosis Date  . Anxiety associated with depression   . Colon cancer [facility])    s/p resection 12/06  . Dementia   . Diabetes mellitus    diet mgt (11`/12)  . FTT (failure to thrive)    admited 08/2008  . History of cystoscopy    uretheral stricutre, 2005- Dr.Humphreys  . Hyperlipidemia   . Hypertension   . Loss of hearing    being fitted for hearing aids (11/12)  . Osteoarthritis    hands  . Pneumonia   . Prostate cancer ([place])    s/p XRT  . SBO (small bowel obstruction) ([place]) 03/08/2014  . Urolithiasis     Patient Active Problem List   Diagnosis Date Noted  . Acute on chronic systolic CHF (congestive heart failure) ([place]) 01/01/2016  . Hyponatremia 01/01/2016  . Depression with anxiety  01/01/2016  . Acute exacerbation of congestive heart failure ([place]) 01/01/2016  . Elevated troponin I level   . Physical deconditioning   . Other seasonal allergic rhinitis 09/29/2015  . AKI (acute kidney injury) ([place]) 07/19/2015  . Diabetes mellitus type 2, diet-controlled ([place]) 07/19/2015  . Constipation 05/30/2015  . Routine general medical examination at a health care facility 01/27/2015  . Hyperglycemia 01/25/2015  . Mild dementia 11/05/2014  . Acute combined systolic and diastolic heart failure - EF 42% on echo 12/2013- medical therapy 01/25/2014  . DJD (degenerative joint disease) 02/20/2013  . Bradycardia 01/21/2012  . Major depressive disorder, recurrent episode, severe, without mention of psychotic behavior 08/08/2011  . PVC's (premature ventricular contractions) 01/31/2009  . URINARY INCONTINENCE 07/16/2008  . ADENOCARCINOMA, COLON 05/13/2006  . ADENOCARCINOMA, PROSTATE 05/13/2006  . Hyperlipidemia with target LDL less than 100 05/13/2006  . Essential hypertension, benign 05/13/2006    Past Surgical History:  Procedure Laterality Date  . CATARACT EXTRACTION, BILATERAL    . HEMICOLECTOMY  12/06  . TRANSURETHRAL RESECTION OF PROSTATE    . xrt     ERT for prostate ca       Home Medications    Prior to Admission medications   Medication Sig Start Date End Date Taking? Authorizing Provider  ALPRAZolam (XANAX) 0.5 MG tablet TAKE 1 TABLET BY MOUTH THREE TIMES DAILY AS NEEDED FOR ANXIETY  Patient taking differently: TAKE 1 TABLET EVERY MORNING 09/12/15  Yes Janith Lima, MD  aspirin 81 MG tablet Take 81 mg by mouth daily.     Yes Historical Provider, MD  Azelastine-Fluticasone (DYMISTA) 137-50 MCG/ACT SUSP Place 1 Act into the nose 2 (two) times daily. Patient taking differently: Place 1 spray into both nostrils 2 (two) times daily as needed (for allergies).  09/29/15  Yes Janith Lima, MD  carvedilol (COREG) 3.125 MG tablet Take 1 tablet (3.125 mg total) by mouth 2 (two)  times daily with a meal. 12/22/15  Yes Barton Dubois, MD  Cyanocobalamin (VITAMIN B-12 PO) Take 1 tablet by mouth daily with breakfast.   Yes Historical Provider, MD  fesoterodine (TOVIAZ) 8 MG TB24 tablet Take 1 tablet (8 mg total) by mouth daily. 05/13/14  Yes Janith Lima, MD  furosemide (LASIX) 20 MG tablet Take 1 tablet (20 mg total) by mouth daily. 12/22/15  Yes Barton Dubois, MD  HYDROcodone-acetaminophen (NORCO/VICODIN) 5-325 MG tablet Take 1 tablet by mouth every 6 (six) hours as needed for moderate pain. 12/29/15  Yes Janith Lima, MD  polyethylene glycol [facility] / GLYCOLAX) packet Take 17 g by mouth daily. Patient taking differently: Take 17 g by mouth daily. [place] AND DRINK 11/29/15  Yes Janith Lima, MD  promethazine-dextromethorphan (PROMETHAZINE-DM) 6.25-15 MG/5ML syrup Take 5 mLs by mouth 4 (four) times daily as needed for cough. 12/29/15  Yes Janith Lima, MD  sertraline (ZOLOFT) 100 MG tablet TAKE 1 TABLET BY MOUTH DAILY Patient taking differently: Take 100 mg by mouth once daily 07/08/15  Yes Janith Lima, MD  simvastatin (ZOCOR) 80 MG tablet Take 40 mg by mouth daily at 6 PM.  07/16/15  Yes Historical Provider, MD    Family History Family History  Problem Relation Age of Onset  . Diabetes Daughter   . Cancer Neg Hx   . Early death Neg Hx   . Heart disease Neg Hx   . Hyperlipidemia Neg Hx   . Hypertension Neg Hx   . Kidney disease Neg Hx   . Stroke Neg Hx   . Heart attack Neg Hx     Social History Social History  Substance Use Topics  . Smoking status: Former Smoker    Quit date: 03/20/1967  . Smokeless tobacco: Never Used  . Alcohol use No     Comment: quit '68     Allergies   Review of patient's allergies indicates no known allergies.   Review of Systems Review of Systems  Constitutional:       Per HPI, otherwise negative  HENT:       Per HPI, otherwise negative  Respiratory:       Per HPI, otherwise negative  Cardiovascular:       Per HPI,  otherwise negative  Gastrointestinal: Negative for vomiting.  Endocrine:       Negative aside from HPI  Genitourinary:       Neg aside from HPI   Musculoskeletal:       Per HPI, otherwise negative  Skin: Negative.   Neurological: Negative for syncope.     Physical Exam Updated Vital Signs BP 183/90   Pulse 104   Temp 97.8 F (36.6 C) (Oral)   Resp (!) 28   SpO2 96%   Physical Exam  Constitutional: He is oriented to person, place, and time. He appears well-developed. No distress.  HENT:  Head: Normocephalic and atraumatic.  Eyes: Conjunctivae and EOM are  normal.  Cardiovascular: Normal rate and regular rhythm.   Pulmonary/Chest: Effort normal. No stridor. Tachypnea noted. No respiratory distress.  Tachypneic. Mildly diminished on the left. Increased work of breathing.  Abdominal: He exhibits no distension.  Soft abdominal wall hernia, soft.   Musculoskeletal: He exhibits no edema.  Neurological: He is alert and oriented to person, place, and time.  Skin: Skin is warm and dry.  Psychiatric: He has a normal mood and affect.  Nursing note and vitals reviewed.    ED Treatments / Results  DIAGNOSTIC STUDIES: Oxygen Saturation is 96% on RA, nl by my interpretation.    COORDINATION OF CARE: 12:25 AM Discussed treatment plan with pt at bedside which includes labs, CXR, EKG, breathing treatment, and pt agreed to plan.   Labs (all labs ordered are listed, but only abnormal results are displayed) Labs Reviewed  COMPREHENSIVE METABOLIC PANEL - Abnormal; Notable for the following:       Result Value   Sodium 133 (*)    Glucose, Bld 163 (*)    BUN 23 (*)    Calcium 8.7 (*)    Total Protein 6.0 (*)    Albumin 3.3 (*)    AST 52 (*)    GFR calc non Af Amer 50 (*)    GFR calc Af Amer 58 (*)    All other components within normal limits  TROPONIN I - Abnormal; Notable for the following:    Troponin I 0.06 (*)    All other components within normal limits  BRAIN NATRIURETIC  PEPTIDE - Abnormal; Notable for the following:    B Natriuretic Peptide 2,335.5 (*)    All other components within normal limits  CBC WITH DIFFERENTIAL/PLATELET  I-STAT CG4 LACTIC ACID, ED    EKG  EKG Interpretation  Date/Time:  Saturday December 31 2015 23:45:05 EDT Ventricular Rate:  102 PR Interval:    QRS Duration: 98 QT Interval:  343 QTC Calculation: 447 R Axis:   70 Text Interpretation:  Sinus tachycardia Multiple ventricular premature complexes Prolonged PR interval Probable left atrial enlargement Anterior infarct, old Abnormal T, consider ischemia, diffuse leads Similar V3 ST elevation in 11/23/15 EKG. No STEMI.  Confirmed by LONG MD, JOSHUA [id number]) on 12/31/2015 11:50:52 PM       Radiology Dg Chest Port 1 View  Result Date: 01/01/2016 CLINICAL DATA:  Worsening shortness of breath and wheezing this evening. EXAM: PORTABLE CHEST 1 VIEW COMPARISON:  [id number] FINDINGS: Cardiac enlargement. Pulmonary vascularity is prominent. Bilateral pleural effusions with basilar atelectasis or infiltration. Changes demonstrate mild improvement since previous study. No pneumothorax. Calcification of the aorta. Degenerative changes in the shoulders IMPRESSION: Cardiac enlargement with pulmonary vascular congestion, bilateral pleural effusions, and basilar atelectasis or infiltration. There is some improvement since previous study. Electronically Signed   By: Lucienne Capers M.D.   On: 01/01/2016 00:40    Procedures Procedures (including critical care time)  Medications Ordered in ED Medications  albuterol (PROVENTIL) (2.5 MG/3ML) 0.083% nebulizer solution 2.5 mg (2.5 mg Nebulization Given 01/01/16 0042)  furosemide (LASIX) injection 40 mg (40 mg Intravenous Given 01/01/16 0112)     Initial Impression / Assessment and Plan / ED Course  I have reviewed the triage vital signs and the nursing notes.  Pertinent labs & imaging results that were available during my care of the patient were reviewed by  me and considered in my medical decision making (see chart for details).  Clinical Course    Following provision of Lasix, albuterol, the patient  feels better, is producing urine. I discussed all findings, results, thoughts with the patient and multiple family members. With concern for progression TO person failure, the patient will require admission for further evaluation and management. Patient does have slight elevation in troponin, though he currently denies chest pain, and is likely demand related ischemia, given the elevated BNP, bilateral pleural effusions, and his history.   Final Clinical Impressions(s) / ED Diagnoses   Final diagnoses:  Acute on chronic congestive heart failure, unspecified congestive heart failure type (HCC)  Pleural effusion, bilateral     I personally performed the services described in this documentation, which was scribed in my presence. The recorded information has been reviewed and is accurate.        Carmin Muskrat, MD 01/01/16 [id number]

## 2016-01-01 ENCOUNTER — Encounter: Payer: Self-pay | Admitting: Internal Medicine

## 2016-01-01 ENCOUNTER — Emergency Department (HOSPITAL_COMMUNITY): Payer: PPO

## 2016-01-01 ENCOUNTER — Encounter (HOSPITAL_COMMUNITY): Payer: Self-pay | Admitting: Family Medicine

## 2016-01-01 DIAGNOSIS — I5023 Acute on chronic systolic (congestive) heart failure: Secondary | ICD-10-CM | POA: Diagnosis not present

## 2016-01-01 DIAGNOSIS — N184 Chronic kidney disease, stage 4 (severe): Secondary | ICD-10-CM | POA: Diagnosis not present

## 2016-01-01 DIAGNOSIS — Z9841 Cataract extraction status, right eye: Secondary | ICD-10-CM | POA: Diagnosis not present

## 2016-01-01 DIAGNOSIS — E871 Hypo-osmolality and hyponatremia: Secondary | ICD-10-CM | POA: Diagnosis not present

## 2016-01-01 DIAGNOSIS — J9811 Atelectasis: Secondary | ICD-10-CM | POA: Diagnosis not present

## 2016-01-01 DIAGNOSIS — R0602 Shortness of breath: Secondary | ICD-10-CM | POA: Insufficient documentation

## 2016-01-01 DIAGNOSIS — E785 Hyperlipidemia, unspecified: Secondary | ICD-10-CM | POA: Diagnosis not present

## 2016-01-01 DIAGNOSIS — I5043 Acute on chronic combined systolic (congestive) and diastolic (congestive) heart failure: Secondary | ICD-10-CM | POA: Diagnosis not present

## 2016-01-01 DIAGNOSIS — Z8505 Personal history of malignant neoplasm of liver: Secondary | ICD-10-CM | POA: Diagnosis not present

## 2016-01-01 DIAGNOSIS — N3281 Overactive bladder: Secondary | ICD-10-CM | POA: Diagnosis not present

## 2016-01-01 DIAGNOSIS — F418 Other specified anxiety disorders: Secondary | ICD-10-CM | POA: Diagnosis present

## 2016-01-01 DIAGNOSIS — Z7982 Long term (current) use of aspirin: Secondary | ICD-10-CM | POA: Diagnosis not present

## 2016-01-01 DIAGNOSIS — R7989 Other specified abnormal findings of blood chemistry: Secondary | ICD-10-CM | POA: Diagnosis not present

## 2016-01-01 DIAGNOSIS — J90 Pleural effusion, not elsewhere classified: Secondary | ICD-10-CM | POA: Diagnosis not present

## 2016-01-01 DIAGNOSIS — Z923 Personal history of irradiation: Secondary | ICD-10-CM | POA: Diagnosis not present

## 2016-01-01 DIAGNOSIS — Z8546 Personal history of malignant neoplasm of prostate: Secondary | ICD-10-CM | POA: Diagnosis not present

## 2016-01-01 DIAGNOSIS — I700 Atherosclerosis of aorta: Secondary | ICD-10-CM | POA: Diagnosis not present

## 2016-01-01 DIAGNOSIS — I130 Hypertensive heart and chronic kidney disease with heart failure and stage 1 through stage 4 chronic kidney disease, or unspecified chronic kidney disease: Secondary | ICD-10-CM | POA: Diagnosis not present

## 2016-01-01 DIAGNOSIS — I509 Heart failure, unspecified: Secondary | ICD-10-CM

## 2016-01-01 DIAGNOSIS — N179 Acute kidney failure, unspecified: Secondary | ICD-10-CM | POA: Diagnosis not present

## 2016-01-01 DIAGNOSIS — E1122 Type 2 diabetes mellitus with diabetic chronic kidney disease: Secondary | ICD-10-CM | POA: Diagnosis not present

## 2016-01-01 DIAGNOSIS — Z85038 Personal history of other malignant neoplasm of large intestine: Secondary | ICD-10-CM | POA: Diagnosis not present

## 2016-01-01 DIAGNOSIS — I5022 Chronic systolic (congestive) heart failure: Secondary | ICD-10-CM | POA: Diagnosis present

## 2016-01-01 DIAGNOSIS — E876 Hypokalemia: Secondary | ICD-10-CM | POA: Diagnosis not present

## 2016-01-01 DIAGNOSIS — N183 Chronic kidney disease, stage 3 unspecified: Secondary | ICD-10-CM | POA: Diagnosis present

## 2016-01-01 DIAGNOSIS — Z9842 Cataract extraction status, left eye: Secondary | ICD-10-CM | POA: Diagnosis not present

## 2016-01-01 DIAGNOSIS — Z87891 Personal history of nicotine dependence: Secondary | ICD-10-CM | POA: Diagnosis not present

## 2016-01-01 DIAGNOSIS — I110 Hypertensive heart disease with heart failure: Secondary | ICD-10-CM | POA: Diagnosis not present

## 2016-01-01 DIAGNOSIS — F0390 Unspecified dementia without behavioral disturbance: Secondary | ICD-10-CM | POA: Diagnosis not present

## 2016-01-01 LAB — BRAIN NATRIURETIC PEPTIDE: B NATRIURETIC PEPTIDE 5: 2335.5 pg/mL — AB (ref 0.0–100.0)

## 2016-01-01 LAB — COMPREHENSIVE METABOLIC PANEL
ALBUMIN: 3.3 g/dL — AB (ref 3.5–5.0)
ALK PHOS: 95 U/L (ref 38–126)
ALT: 29 U/L (ref 17–63)
AST: 52 U/L — ABNORMAL HIGH (ref 15–41)
Anion gap: 7 (ref 5–15)
BUN: 23 mg/dL — ABNORMAL HIGH (ref 6–20)
CALCIUM: 8.7 mg/dL — AB (ref 8.9–10.3)
CO2: 22 mmol/L (ref 22–32)
CREATININE: 1.22 mg/dL (ref 0.61–1.24)
Chloride: 104 mmol/L (ref 101–111)
GFR calc Af Amer: 58 mL/min — ABNORMAL LOW (ref >60)
GFR calc non Af Amer: 50 mL/min — ABNORMAL LOW (ref >60)
GLUCOSE: 163 mg/dL — AB (ref 65–99)
Potassium: 4.6 mmol/L (ref 3.5–5.1)
SODIUM: 133 mmol/L — AB (ref 135–145)
Total Bilirubin: 0.5 mg/dL (ref 0.3–1.2)
Total Protein: 6 g/dL — ABNORMAL LOW (ref 6.5–8.1)

## 2016-01-01 LAB — I-STAT CG4 LACTIC ACID, ED: Lactic Acid, Venous: 1.15 mmol/L (ref 0.5–1.9)

## 2016-01-01 LAB — GLUCOSE, CAPILLARY: Glucose-Capillary: 99 mg/dL (ref 65–99)

## 2016-01-01 LAB — TROPONIN I: TROPONIN I: 0.06 ng/mL — AB (ref <0.03)

## 2016-01-01 MED ORDER — SODIUM CHLORIDE 0.9% FLUSH
3.0000 mL | Freq: Two times a day (BID) | INTRAVENOUS | Status: DC
  Administered 2016-01-01 (×2): 3 mL via INTRAVENOUS

## 2016-01-01 MED ORDER — HEPARIN SODIUM (PORCINE) 5000 UNIT/ML IJ SOLN
5000.0000 [IU] | Freq: Three times a day (TID) | INTRAMUSCULAR | Status: DC
  Administered 2016-01-01 – 2016-01-04 (×10): 5000 [IU] via SUBCUTANEOUS
  Filled 2016-01-01 (×10): qty 1

## 2016-01-01 MED ORDER — DEXTROMETHORPHAN POLISTIREX ER 30 MG/5ML PO SUER
30.0000 mg | Freq: Two times a day (BID) | ORAL | Status: DC | PRN
  Filled 2016-01-01: qty 5

## 2016-01-01 MED ORDER — HYDROCODONE-ACETAMINOPHEN 5-325 MG PO TABS: 1.0000 | ORAL_TABLET | Freq: Four times a day (QID) | ORAL | Status: DC | PRN

## 2016-01-01 MED ORDER — ASPIRIN EC 81 MG PO TBEC
81.0000 mg | DELAYED_RELEASE_TABLET | Freq: Every day | ORAL | Status: DC
  Administered 2016-01-01 – 2016-01-04 (×4): 81 mg via ORAL
  Filled 2016-01-01 (×4): qty 1

## 2016-01-01 MED ORDER — ALBUTEROL SULFATE (2.5 MG/3ML) 0.083% IN NEBU
2.5000 mg | INHALATION_SOLUTION | Freq: Once | RESPIRATORY_TRACT | Status: AC
  Administered 2016-01-01: 2.5 mg via RESPIRATORY_TRACT
  Filled 2016-01-01: qty 3

## 2016-01-01 MED ORDER — FUROSEMIDE 10 MG/ML IJ SOLN
40.0000 mg | Freq: Once | INTRAMUSCULAR | Status: AC
  Administered 2016-01-01: 40 mg via INTRAVENOUS
  Filled 2016-01-01: qty 4

## 2016-01-01 MED ORDER — POLYETHYLENE GLYCOL 3350 17 G PO PACK
17.0000 g | PACK | Freq: Every day | ORAL | Status: DC
  Administered 2016-01-01 – 2016-01-04 (×4): 17 g via ORAL
  Filled 2016-01-01 (×4): qty 1

## 2016-01-01 MED ORDER — ATORVASTATIN CALCIUM 40 MG PO TABS
40.0000 mg | ORAL_TABLET | Freq: Every day | ORAL | Status: DC
  Administered 2016-01-01 – 2016-01-03 (×3): 40 mg via ORAL
  Filled 2016-01-01 (×3): qty 1

## 2016-01-01 MED ORDER — AZELASTINE-FLUTICASONE 137-50 MCG/ACT NA SUSP: 1.0000 | Freq: Two times a day (BID) | NASAL | Status: DC | PRN

## 2016-01-01 MED ORDER — SODIUM CHLORIDE 0.9% FLUSH: 3.0000 mL | INTRAVENOUS | Status: DC | PRN

## 2016-01-01 MED ORDER — IPRATROPIUM-ALBUTEROL 0.5-2.5 (3) MG/3ML IN SOLN
3.0000 mL | Freq: Once | RESPIRATORY_TRACT | Status: DC
  Filled 2016-01-01: qty 3

## 2016-01-01 MED ORDER — PROMETHAZINE-DM 6.25-15 MG/5ML PO SYRP: 5.0000 mL | ORAL_SOLUTION | Freq: Four times a day (QID) | ORAL | Status: DC | PRN

## 2016-01-01 MED ORDER — SERTRALINE HCL 50 MG PO TABS
100.0000 mg | ORAL_TABLET | Freq: Every day | ORAL | Status: DC
  Administered 2016-01-01 – 2016-01-04 (×4): 100 mg via ORAL
  Filled 2016-01-01 (×5): qty 2

## 2016-01-01 MED ORDER — FLUTICASONE PROPIONATE 50 MCG/ACT NA SUSP
1.0000 | Freq: Two times a day (BID) | NASAL | Status: DC | PRN
  Filled 2016-01-01: qty 16

## 2016-01-01 MED ORDER — CARVEDILOL 3.125 MG PO TABS
3.1250 mg | ORAL_TABLET | Freq: Two times a day (BID) | ORAL | Status: DC
  Administered 2016-01-01 – 2016-01-03 (×6): 3.125 mg via ORAL
  Filled 2016-01-01 (×6): qty 1

## 2016-01-01 MED ORDER — ACETAMINOPHEN 325 MG PO TABS: 650.0000 mg | ORAL_TABLET | ORAL | Status: DC | PRN

## 2016-01-01 MED ORDER — FUROSEMIDE 10 MG/ML IJ SOLN
40.0000 mg | Freq: Two times a day (BID) | INTRAMUSCULAR | Status: DC
  Administered 2016-01-01 – 2016-01-03 (×5): 40 mg via INTRAVENOUS
  Filled 2016-01-01 (×5): qty 4

## 2016-01-01 MED ORDER — SODIUM CHLORIDE 0.9 % IV SOLN: 250.0000 mL | INTRAVENOUS | Status: DC | PRN

## 2016-01-01 MED ORDER — ISOSORB DINITRATE-HYDRALAZINE 20-37.5 MG PO TABS
1.0000 | ORAL_TABLET | Freq: Three times a day (TID) | ORAL | Status: DC
  Administered 2016-01-01 – 2016-01-04 (×10): 1 via ORAL
  Filled 2016-01-01 (×10): qty 1

## 2016-01-01 MED ORDER — FESOTERODINE FUMARATE ER 8 MG PO TB24
8.0000 mg | ORAL_TABLET | Freq: Every day | ORAL | Status: DC
  Administered 2016-01-01 – 2016-01-03 (×3): 8 mg via ORAL
  Filled 2016-01-01 (×4): qty 1

## 2016-01-01 MED ORDER — AZELASTINE HCL 0.1 % NA SOLN
1.0000 | Freq: Two times a day (BID) | NASAL | Status: DC | PRN
  Filled 2016-01-01: qty 30

## 2016-01-01 MED ORDER — ONDANSETRON HCL 4 MG/2ML IJ SOLN: 4.0000 mg | Freq: Four times a day (QID) | INTRAMUSCULAR | Status: DC | PRN

## 2016-01-01 MED ORDER — ALPRAZOLAM 0.25 MG PO TABS
0.5000 mg | ORAL_TABLET | Freq: Every morning | ORAL | Status: DC
  Administered 2016-01-01 – 2016-01-04 (×4): 0.5 mg via ORAL
  Filled 2016-01-01 (×4): qty 2

## 2016-01-01 NOTE — H&P (Signed)
History and Physical    Russell [name] [id number] DOB: 1923-09-22 DOA: 12/31/2015  PCP: Scarlette Calico, MD   Patient coming from: Home   Chief Complaint: Dyspnea, cough, orthopnea, PND   HPI: Russell [name] is a 82 y.o. male with medical history significant for chronic systolic CHF with EF 0000000, mild dementia, depression, anxiety, colon cancer status post resection, and prostate cancer status post radiation therapy who presents to the emergency department for evaluation of dyspnea, cough, orthopnea, and PND. Patient was admitted to the hospital from 12/16/2015 - 12/22/2015 under similar circumstances and was treated for acute exacerbation and CHF. The was discharged home in much improved and stable condition after being net -3.4 L and losing close to 10 pounds. He continued to have a chronic cough at home but was breathing well initially. Over the past several days, his respiratory status has re-worsened and he has been dyspneic at rest, particularly at night when he lays down to sleep. He has been sleeping on 3 pillows, but continues to experience orthopnea and PND. Tonight, when the patient couldn't lie on 3 pillows due to dyspnea, he asked his daughter to take him to the hospital. There's been no recent fevers or chills. He denies chest pain or palpitations. There has been an increase in his chronic bilateral lower extremity edema.   ED Course: Upon arrival to the ED, patient is found to be afebrile, saturating adequately on room air, tachypneic in the high 20s, and tachycardic in the low 100s with hypertension. EKG features a sinus tachycardia with rate 102, PVCs, first-degree AV nodal block, and diffuse T wave changes. Chest x-ray is notable for cardiomegaly with pulmonary vascular congestion and bilateral pleural effusions. Basilar atelectasis versus infiltrate is also noted on the chest x-ray but improved some from the recent admission. Chemistry panel features a hyponatremia to 133, BUN of 23, and  serum creatinine 1.22 which appears consistent with his baseline. CBC is unremarkable, lactic acid is reassuring at 1.15, troponin is mildly elevated to 0.06, and BNP is markedly elevated to a value of 2336. Patient was treated with an albuterol nebulizer in the emergency department and a 40 mg IV push of Lasix. He has begun to diurese and reports some subjective improvement since with that. Tachypnea has resolved, but patient continues to be mildly hypertensive and tachycardic. He is in no acute respiratory distress and continues to deny chest pain or palpitations. He will be admitted to the telemetry unit for ongoing evaluation and management of dyspnea with orthopnea and PND suspected secondary to acute exacerbation of chronic systolic CHF.  Review of Systems:  All other systems reviewed and apart from HPI, are negative.  Past Medical History:  Diagnosis Date  . Anxiety associated with depression   . Colon cancer [facility])    s/p resection 12/06  . Dementia   . Diabetes mellitus    diet mgt (11`/12)  . FTT (failure to thrive)    admited 08/2008  . History of cystoscopy    uretheral stricutre, 2005- Dr.Humphreys  . Hyperlipidemia   . Hypertension   . Loss of hearing    being fitted for hearing aids (11/12)  . Osteoarthritis    hands  . Pneumonia   . Prostate cancer ([place])    s/p XRT  . SBO (small bowel obstruction) ([place]) 03/08/2014  . Urolithiasis     Past Surgical History:  Procedure Laterality Date  . CATARACT EXTRACTION, BILATERAL    . HEMICOLECTOMY  12/06  . TRANSURETHRAL RESECTION  OF PROSTATE    . xrt     ERT for prostate ca     reports that he quit smoking about 48 years ago. He has never used smokeless tobacco. He reports that he does not drink alcohol or use drugs.  No Known Allergies  Family History  Problem Relation Age of Onset  . Diabetes Daughter   . Cancer Neg Hx   . Early death Neg Hx   . Heart disease Neg Hx   . Hyperlipidemia Neg Hx   . Hypertension Neg  Hx   . Kidney disease Neg Hx   . Stroke Neg Hx   . Heart attack Neg Hx      Prior to Admission medications   Medication Sig Start Date End Date Taking? Authorizing Provider  ALPRAZolam (XANAX) 0.5 MG tablet TAKE 1 TABLET BY MOUTH THREE TIMES DAILY AS NEEDED FOR ANXIETY Patient taking differently: TAKE 1 TABLET EVERY MORNING 09/12/15  Yes Janith Lima, MD  aspirin 81 MG tablet Take 81 mg by mouth daily.     Yes Historical Provider, MD  Azelastine-Fluticasone (DYMISTA) 137-50 MCG/ACT SUSP Place 1 Act into the nose 2 (two) times daily. Patient taking differently: Place 1 spray into both nostrils 2 (two) times daily as needed (for allergies).  09/29/15  Yes Janith Lima, MD  carvedilol (COREG) 3.125 MG tablet Take 1 tablet (3.125 mg total) by mouth 2 (two) times daily with a meal. 12/22/15  Yes Barton Dubois, MD  Cyanocobalamin (VITAMIN B-12 PO) Take 1 tablet by mouth daily with breakfast.   Yes Historical Provider, MD  fesoterodine (TOVIAZ) 8 MG TB24 tablet Take 1 tablet (8 mg total) by mouth daily. 05/13/14  Yes Janith Lima, MD  furosemide (LASIX) 20 MG tablet Take 1 tablet (20 mg total) by mouth daily. 12/22/15  Yes Barton Dubois, MD  HYDROcodone-acetaminophen (NORCO/VICODIN) 5-325 MG tablet Take 1 tablet by mouth every 6 (six) hours as needed for moderate pain. 12/29/15  Yes Janith Lima, MD  polyethylene glycol [facility] / GLYCOLAX) packet Take 17 g by mouth daily. Patient taking differently: Take 17 g by mouth daily. [place] AND DRINK 11/29/15  Yes Janith Lima, MD  promethazine-dextromethorphan (PROMETHAZINE-DM) 6.25-15 MG/5ML syrup Take 5 mLs by mouth 4 (four) times daily as needed for cough. 12/29/15  Yes Janith Lima, MD  sertraline (ZOLOFT) 100 MG tablet TAKE 1 TABLET BY MOUTH DAILY Patient taking differently: Take 100 mg by mouth once daily 07/08/15  Yes Janith Lima, MD  simvastatin (ZOCOR) 80 MG tablet Take 40 mg by mouth daily at 6 PM.  07/16/15  Yes Historical Provider, MD     Physical Exam: Vitals:   12/31/15 2345 12/31/15 2346 01/01/16 0000 01/01/16 0015  BP: (!) 158/103  152/88 183/90  Pulse: 102  98 104  Resp: 23  (!) 27 (!) 28  Temp: 97.8 F (36.6 C)     TempSrc: Oral     SpO2: 97% 97% 96% 96%      Constitutional: NAD, calm, comfortable Eyes: PERTLA, lids and conjunctivae normal ENMT: Mucous membranes are moist. Posterior pharynx clear of any exudate or lesions.   Neck: normal, supple, no masses, no thyromegaly Respiratory: Coarse upper airways noise, crackles at b/l mid-lung zones. Normal respiratory effort.    Cardiovascular: Rate ~100 and regular with soft systolic murmur throughout precordium. 2+ b/l pretibial edema. Mild neck vein distension while upright.   Abdomen: No distension, no tenderness, no masses palpated. Bowel sounds normal.  Musculoskeletal: no clubbing / cyanosis. No joint deformity upper and lower extremities. Normal muscle tone.  Skin: no significant rashes, lesions, ulcers. Warm, dry, well-perfused. Neurologic: CN 2-12 grossly intact. Sensation intact, DTR normal. Strength 5/5 in all 4 limbs.  Psychiatric: Normal judgment and insight. Alert and oriented to person and place, but not month or year. Normal mood and affect.     Labs on Admission: I have personally reviewed following labs and imaging studies  CBC:  Recent Labs Lab 12/31/15 2350  WBC 4.2  NEUTROABS 2.7  HGB 13.7  HCT 41.5  MCV 87.7  PLT 99991111   Basic Metabolic Panel:  Recent Labs Lab 12/31/15 2350  NA 133*  K 4.6  CL 104  CO2 22  GLUCOSE 163*  BUN 23*  CREATININE 1.22  CALCIUM 8.7*   GFR: Estimated Creatinine Clearance: 37.6 mL/min (by C-G formula based on SCr of 1.22 mg/dL). Liver Function Tests:  Recent Labs Lab 12/31/15 2350  AST 52*  ALT 29  ALKPHOS 95  BILITOT 0.5  PROT 6.0*  ALBUMIN 3.3*   No results for input(s): LIPASE, AMYLASE in the last 168 hours. No results for input(s): AMMONIA in the last 168 hours. Coagulation  Profile: No results for input(s): INR, PROTIME in the last 168 hours. Cardiac Enzymes:  Recent Labs Lab 12/31/15 2350  TROPONINI 0.06*   BNP (last 3 results) No results for input(s): PROBNP in the last 8760 hours. HbA1C: No results for input(s): HGBA1C in the last 72 hours. CBG: No results for input(s): GLUCAP in the last 168 hours. Lipid Profile: No results for input(s): CHOL, HDL, LDLCALC, TRIG, CHOLHDL, LDLDIRECT in the last 72 hours. Thyroid Function Tests: No results for input(s): TSH, T4TOTAL, FREET4, T3FREE, THYROIDAB in the last 72 hours. Anemia Panel: No results for input(s): VITAMINB12, FOLATE, FERRITIN, TIBC, IRON, RETICCTPCT in the last 72 hours. Urine analysis:    Component Value Date/Time   COLORURINE YELLOW 12/13/2015 1559   APPEARANCEUR CLEAR 12/13/2015 1559   LABSPEC 1.010 12/13/2015 1559   PHURINE 6.0 12/13/2015 1559   GLUCOSEU NEGATIVE 12/13/2015 1559   HGBUR NEGATIVE 12/13/2015 1559   HGBUR large 08/03/2008 1258   BILIRUBINUR NEGATIVE 12/13/2015 1559   BILIRUBINUR neg 04/09/2014 1056   KETONESUR NEGATIVE 12/13/2015 1559   PROTEINUR NEGATIVE 11/23/2015 1300   UROBILINOGEN 0.2 12/13/2015 1559   NITRITE NEGATIVE 12/13/2015 1559   LEUKOCYTESUR TRACE (A) 12/13/2015 1559   Sepsis Labs: @LABRCNTIP (procalcitonin:4,lacticidven:4) )No results found for this or any previous visit (from the past 240 hour(s)).   Radiological Exams on Admission: Dg Chest Port 1 View  Result Date: 01/01/2016 CLINICAL DATA:  Worsening shortness of breath and wheezing this evening. EXAM: PORTABLE CHEST 1 VIEW COMPARISON:  [id number] FINDINGS: Cardiac enlargement. Pulmonary vascularity is prominent. Bilateral pleural effusions with basilar atelectasis or infiltration. Changes demonstrate mild improvement since previous study. No pneumothorax. Calcification of the aorta. Degenerative changes in the shoulders IMPRESSION: Cardiac enlargement with pulmonary vascular congestion, bilateral pleural  effusions, and basilar atelectasis or infiltration. There is some improvement since previous study. Electronically Signed   By: Lucienne Capers M.D.   On: 01/01/2016 00:40    EKG: Independently reviewed. Sinus tachycardia (rate 102), PVC's, first degree AV block, diffuse T-wave changes   Assessment/Plan  1. Acute on chronic systolic CHF  - Discharged 12/22/15 after treatment for same; had diuresed a net -3.4 L and lost 10 lbs prior to discharge  - TTE (12/22/15) with EF 20-25%, diffuse HK, focal areas of AK, mild LVH,  mild AR, mild MR, and mild LAE  - Per his daughters at the bedside, they do not check weights or keep track of what he eats and how much he drinks, though 1 one them acknowledges that they were educated about this prior to the discharge  - He presents with JVD, peripheral edema, orthopnea, BNP 2336  - Given a dose of Lasix 40 mg IV in ED, has been diuresing, and reports some improvement already  - Unfortunately he hasn't been weighed yet tonight and his voids weren't collected  - Monitor on telemetry, continue diuresis with Lasix 40 mg IV q12h  - Follow BMP qAM  - Resume Coreg tomorrow as tolerated; no ACE/ARB d/t renal insufficiency; TTE done 1 wk ago, no plan to repeat at this time     2. Elevated troponin - Troponin elevated to 0.06 on admission in the absence of angina  - During the recent admission, troponin was 0.18 on admission, peaked at 0.21, and was 0.19 on discharge  - EKG features diffuse T-wave abnormalities, similar to some of his priors, but worse  - Suspect this represents a demand ischemia in the setting of acute CHF with CKD - Monitor on telemetry, trend troponin, repeat EKG in am   3. CKD stage III  - SCr 1.22 on admission, appears consistent with his baseline renal fxn  - Follow daily BMP during diuresis  - Developed AKI last admission, limiting his diuresis    4. Hypertension - Hypertensive currently in setting of acute CHF  - Anticipate improvement  with diuresis  - Resume his Coreg tomorrow as tolerated  - Hydralazine IVP's available prn    5. Depression, anxiety  - Appears to be stable - Continue current management with Zoloft and low-dose Xanax   6. Hyponatremia  - Serum sodium 133 on admission - Likely secondary to CHF, anticipate improvement with diuresis  - Follow BMP qAM while diuresing   DVT prophylaxis: sq heparin  Code Status: Full  Family Communication: Daughters updated at bedside  Disposition Plan: Admit to telemetry Consults called: None Admission status: Inpatient    Vianne Bulls, MD [facility] Pager [phone]  If 7PM-7AM, please contact night-coverage [email or web address] Password [facility]  01/01/2016, 2:37 AM

## 2016-01-01 NOTE — Progress Notes (Signed)
PROGRESS NOTE                                                                                                                                                                                                             Patient Demographics:    Russell [name], is a 82 y.o. male, DOB - March 28, 1924, [id number]  Admit date - 12/31/2015   Admitting Physician Vianne Bulls, MD  Outpatient Primary MD for the patient is Russell Calico, MD  LOS - 0  Chief Complaint  Patient presents with  . Shortness of Breath       Brief Narrative  Russell [name] is a 82 y.o. male with medical history significant for chronic systolic CHF with EF 0000000, mild dementia, depression, anxiety, colon cancer status post resection, and prostate cancer status post radiation therapy who presents to the emergency department for evaluation of dyspnea, cough, orthopnea, and PND. Patient was admitted to the hospital from 12/16/2015 - 12/22/2015 under similar circumstances and was treated for acute exacerbation and CHF. The was discharged home in much improved and stable condition after being net -3.4 L and losing close to 10 pounds. Now back with CHF.   Subjective:    Russell [name] today has, No headache, No chest pain, No abdominal pain - No Nausea, No new weakness tingling or numbness, No Cough - improved SOB.     Assessment  & Plan :    1.Acute on chronic combined systolic and diastolic CHF with EF 0000000 on recent echocardiogram with diffuse hypokinesis. Limited health education for patient and family members at home, continue IV Lasix along with Coreg, not on ACE or ARB with due to renal failure, will add BiDil, monitor intake and output and weight, shortness of breath is improved. Monitor I&O   Filed Weights   01/01/16 0355  Weight: 68.8 kg (151 lb 9.6 oz)    2. Dyslipidemia. Continue statin.  3. Hypertension. For now Coreg and BiDil along with  diuretics.  4. Overactive bladder. Continue Toviaz.  5. Chronic kidney disease stage IV. Baseline creatinine close to 2.2, monitor.    Family Communication  :  none  Code Status :  Full  Diet : Heart healthy  Disposition Plan  :  TBD  Consults  :  Cards in am  Procedures  :    TTE 11-2015  Left ventricle: LVEF is severely depressed with diffuse hypokinesis; mid/distal inferior and posterior akinesis and apical akinesis. Wall thickness was increased in a pattern of   mild LVH. Systolic function was severely reduced. The estimated ejection fraction was in the range of 20% to 25%. - Aortic valve: There was mild regurgitation. - Mitral valve: There was mild regurgitation. - Left atrium: The atrium was mildly dilated. - Right ventricle: The cavity size was mildly dilated. Systolic function was moderately to severely reduced. - Pericardium, extracardiac: A small pericardial effusion was identified.   DVT Prophylaxis  :    Heparin    Lab Results  Component Value Date   PLT 180 12/31/2015    Inpatient Medications  Scheduled Meds: . ALPRAZolam  0.5 mg Oral q morning - 10a  . aspirin EC  81 mg Oral Daily  . atorvastatin  40 mg Oral q1800  . carvedilol  3.125 mg Oral BID WC  . fesoterodine  8 mg Oral Daily  . furosemide  40 mg Intravenous BID  . heparin  5,000 Units Subcutaneous Q8H  . polyethylene glycol  17 g Oral Daily  . sertraline  100 mg Oral Daily   Continuous Infusions:  PRN Meds:.acetaminophen, azelastine, dextromethorphan, fluticasone, HYDROcodone-acetaminophen, ondansetron (ZOFRAN) IV  Antibiotics  :    Anti-infectives    None         Objective:   Vitals:   01/01/16 0300 01/01/16 0315 01/01/16 0355 01/01/16 0823  BP: 125/80 138/58 133/73 (!) 130/57  Pulse: 65 73 70 63  Resp: 13 21 20    Temp:   98.2 F (36.8 C)   TempSrc:   Oral   SpO2: 99% 99% 99%   Weight:   68.8 kg (151 lb 9.6 oz)     Wt Readings from Last 3 Encounters:  01/01/16 68.8 kg  (151 lb 9.6 oz)  12/29/15 69.6 kg (153 lb 8 oz)  12/22/15 69.5 kg (153 lb 4.8 oz)     Intake/Output Summary (Last 24 hours) at 01/01/16 1117 Last data filed at 01/01/16 0946  Gross per 24 hour  Intake                3 ml  Output              200 ml  Net             -197 ml     Physical Exam  Awake Alert, Oriented X 3, No new F.N deficits, Normal affect [place].AT,PERRAL Supple Neck,No JVD, No cervical lymphadenopathy appriciated.  Symmetrical Chest wall movement, Good air movement bilaterally, few rales RRR,No Gallops,Rubs or new Murmurs, No Parasternal Heave +ve B.Sounds, Abd Soft, No tenderness, No organomegaly appriciated, No rebound - guarding or rigidity. No Cyanosis, Clubbing , trace edema, No new Rash or bruise      Data Review:    CBC  Recent Labs Lab 12/31/15 2350  WBC 4.2  HGB 13.7  HCT 41.5  PLT 180  MCV 87.7  MCH 29.0  MCHC 33.0  RDW 15.2  LYMPHSABS 0.8  MONOABS 0.4  EOSABS 0.2  BASOSABS 0.0    Chemistries   Recent Labs Lab 12/31/15 2350  NA 133*  K 4.6  CL 104  CO2 22  GLUCOSE 163*  BUN 23*  CREATININE 1.22  CALCIUM 8.7*  AST 52*  ALT 29  ALKPHOS 95  BILITOT 0.5   ------------------------------------------------------------------------------------------------------------------ No results for input(s): CHOL, HDL, LDLCALC, TRIG, CHOLHDL, LDLDIRECT in the last 72 hours.  Lab Results  Component Value Date   HGBA1C 5.4 12/17/2015   ------------------------------------------------------------------------------------------------------------------ No results for input(s): TSH, T4TOTAL, T3FREE, THYROIDAB in the last 72 hours.  Invalid input(s): FREET3 ------------------------------------------------------------------------------------------------------------------ No results for input(s): VITAMINB12, FOLATE, FERRITIN, TIBC, IRON, RETICCTPCT in the last 72 hours.  Coagulation profile No results for input(s): INR, PROTIME in the last 168  hours.  No results for input(s): DDIMER in the last 72 hours.  Cardiac Enzymes  Recent Labs Lab 12/31/15 2350  TROPONINI 0.06*   ------------------------------------------------------------------------------------------------------------------    Component Value Date/Time   BNP 2,335.5 (H) 12/31/2015 2350    Micro Results No results found for this or any previous visit (from the past 240 hour(s)).  Radiology Reports  Dg Chest Port 1 View  Result Date: 01/01/2016 CLINICAL DATA:  Worsening shortness of breath and wheezing this evening. EXAM: PORTABLE CHEST 1 VIEW COMPARISON:  [id number] FINDINGS: Cardiac enlargement. Pulmonary vascularity is prominent. Bilateral pleural effusions with basilar atelectasis or infiltration. Changes demonstrate mild improvement since previous study. No pneumothorax. Calcification of the aorta. Degenerative changes in the shoulders IMPRESSION: Cardiac enlargement with pulmonary vascular congestion, bilateral pleural effusions, and basilar atelectasis or infiltration. There is some improvement since previous study. Electronically Signed   By: Lucienne Capers M.D.   On: 01/01/2016 00:40    Time Spent in minutes  30   Tanesha Arambula K M.D on 01/01/2016 at 11:17 AM  Between 7am to 7pm - Pager - [phone]  After 7pm go to [email or web address] - password [facility]  [facility] -  Office  [phone]

## 2016-01-01 NOTE — Evaluation (Signed)
Physical Therapy Evaluation Patient Details Name: Russell [name] MRN: [id number] DOB: Jun 02, 1923 Today's Date: 01/01/2016   History of Present Illness  Patient is a 82 yo male admitted 12/31/15 with CHF exacerbation.    PMH:  CHF, EF 20-25%, mild dementia, depression, anxiety, HOH, HTN, CKD, DM    Clinical Impression  Patient presents with problems listed below.  Will benefit from acute PT to maximize functional mobility prior to discharge home with family.  Patient has 24 hour assist.  Was receiving HHPT pta.  Recommend HHPT continue at d/c.    Follow Up Recommendations Home health PT;Supervision/Assistance - 24 hour    Equipment Recommendations  None recommended by PT    Recommendations for Other Services       Precautions / Restrictions Precautions Precautions: Fall Precaution Comments: 1 fall in past 6 months - in bathroom Restrictions Weight Bearing Restrictions: No      Mobility  Bed Mobility Overal bed mobility: Needs Assistance Bed Mobility: Supine to Sit;Sit to Supine     Supine to sit: Min assist Sit to supine: Min assist   General bed mobility comments: Assist to raise trunk to sitting.  Patient with good sitting balance.  Assist to bring LE's onto bed to return to supine.  Transfers Overall transfer level: Needs assistance Equipment used: Rolling walker (2 wheeled) Transfers: Sit to/from Stand Sit to Stand: Min guard         General transfer comment: Verbal cues for hand placement.  Assist for safety only.  Ambulation/Gait Ambulation/Gait assistance: Min guard Ambulation Distance (Feet): 180 Feet Assistive device: Rolling walker (2 wheeled) Gait Pattern/deviations: Step-through pattern;Decreased stride length Gait velocity: decreased Gait velocity interpretation: Below normal speed for age/gender General Gait Details: Verbal cues to stay close to RW for safety.  Slow steady gait with RW.  Stairs            Wheelchair Mobility    Modified  Rankin (Stroke Patients Only)       Balance   Sitting-balance support: No upper extremity supported;Feet supported Sitting balance-Leahy Scale: Good     Standing balance support: Bilateral upper extremity supported Standing balance-Leahy Scale: Poor                               Pertinent Vitals/Pain Pain Assessment: No/denies pain    Home Living Family/patient expects to be discharged to:: Private residence Living Arrangements: Children Available Help at Discharge: Family;Personal care attendant;Available 24 hours/day (Aide 4 days/week, 2-3 hours/day) Type of Home: House Home Access: Ramped entrance     Home Layout: One level Home Equipment: Walker - 2 wheels;Walker - 4 wheels;Cane - single point;Bedside commode;Shower seat      Prior Function Level of Independence: Independent with assistive device(s);Needs assistance   Gait / Transfers Assistance Needed: Uses cane for ambulation  ADL's / Homemaking Assistance Needed: Aide assists patient with bath, meal prep, housekeeping.   Three daughters assist as well.        Hand Dominance        Extremity/Trunk Assessment   Upper Extremity Assessment: Generalized weakness           Lower Extremity Assessment: Generalized weakness      Cervical / Trunk Assessment: Normal  Communication   Communication: HOH  Cognition Arousal/Alertness: Awake/alert Behavior During Therapy: WFL for tasks assessed/performed;Flat affect Overall Cognitive Status: History of cognitive impairments - at baseline   Orientation Level: Disoriented to;Time;Situation   Memory: Decreased  short-term memory Following Commands: Follows one step commands consistently            General Comments      Exercises        Assessment/Plan    PT Assessment Patient needs continued PT services  PT Diagnosis Abnormality of gait;Generalized weakness;Altered mental status   PT Problem List Decreased strength;Decreased activity  tolerance;Decreased balance;Decreased mobility;Decreased cognition;Decreased knowledge of use of DME;Cardiopulmonary status limiting activity  PT Treatment Interventions DME instruction;Gait training;Functional mobility training;Therapeutic activities;Therapeutic exercise;Patient/family education   PT Goals (Current goals can be found in the Care Plan section) Acute Rehab PT Goals Patient Stated Goal: Wants to walk PT Goal Formulation: With patient/family Time For Goal Achievement: 01/08/16 Potential to Achieve Goals: Good    Frequency Min 3X/week   Barriers to discharge        Co-evaluation               End of Session Equipment Utilized During Treatment: Gait belt Activity Tolerance: Patient tolerated treatment well Patient left: in bed;with call bell/phone within reach;with bed alarm set;with family/visitor present Nurse Communication: Mobility status         Time: [id number] PT Time Calculation (min) (ACUTE ONLY): 26 min   Charges:   PT Evaluation $PT Eval Moderate Complexity: 1 Procedure PT Treatments $Gait Training: 8-22 mins   PT G CodesDespina Pole 2016-01-17, 8:10 PM Carita Pian. Sanjuana Kava, [place] Pager [phone]

## 2016-01-02 DIAGNOSIS — N183 Chronic kidney disease, stage 3 (moderate): Secondary | ICD-10-CM

## 2016-01-02 DIAGNOSIS — F418 Other specified anxiety disorders: Secondary | ICD-10-CM

## 2016-01-02 DIAGNOSIS — I10 Essential (primary) hypertension: Secondary | ICD-10-CM

## 2016-01-02 DIAGNOSIS — I5023 Acute on chronic systolic (congestive) heart failure: Secondary | ICD-10-CM

## 2016-01-02 DIAGNOSIS — R7989 Other specified abnormal findings of blood chemistry: Secondary | ICD-10-CM

## 2016-01-02 DIAGNOSIS — E871 Hypo-osmolality and hyponatremia: Secondary | ICD-10-CM

## 2016-01-02 LAB — BASIC METABOLIC PANEL
ANION GAP: 4 — AB (ref 5–15)
BUN: 26 mg/dL — AB (ref 6–20)
CALCIUM: 8.3 mg/dL — AB (ref 8.9–10.3)
CO2: 28 mmol/L (ref 22–32)
CREATININE: 1.3 mg/dL — AB (ref 0.61–1.24)
Chloride: 105 mmol/L (ref 101–111)
GFR calc Af Amer: 54 mL/min — ABNORMAL LOW (ref >60)
GFR calc non Af Amer: 47 mL/min — ABNORMAL LOW (ref >60)
GLUCOSE: 93 mg/dL (ref 65–99)
Potassium: 3.7 mmol/L (ref 3.5–5.1)
Sodium: 137 mmol/L (ref 135–145)

## 2016-01-02 LAB — MAGNESIUM: Magnesium: 1.7 mg/dL (ref 1.7–2.4)

## 2016-01-02 MED ORDER — SODIUM CHLORIDE 0.9 % IV SOLN
1.0000 g | Freq: Once | INTRAVENOUS | Status: AC
  Administered 2016-01-02: 1 g via INTRAVENOUS
  Filled 2016-01-02: qty 10

## 2016-01-02 NOTE — Care Management Note (Signed)
Case Management Note  Patient Details  Name: Russell [name] MRN: [id number] Date of Birth: [birth year]  Subjective/Objective:  Patient lives at home alone, but his daughter [name], is over often to check on him and also his other daughter. He has a pcp, medication coverage and transport at dc.  He has a rolling walker , and a bsc at home.  He has an aide that comes in on Monday at 9 am for 2 hrs, then Tuedays for for 3 hrs, and Thur /Friday for 3 hrs.  [name] takes him to his MD apts. And his daughter Joaquim Lai has a maid who fixes his meals for him , breakfast, lunch and dinner.  [name] states she can not afford the co pay for a RN right now so they would just like to get the HHPT for now, she states he is active with [facility], referral made to [facility].                    Action/Plan:   Expected Discharge Date:                  Expected Discharge Plan:  [place]  In-House Referral:     Discharge planning Services  CM Consult  Post Acute Care Choice:  Home Health, Resumption of Svcs/PTA Provider Choice offered to:  Patient  DME Arranged:    DME Agency:     HH Arranged:  PT [place]:  [place]  Status of Service:  In process, will continue to follow  If discussed at Long Length of Stay Meetings, dates discussed:    Additional Comments:  Zenon Mayo, RN 01/02/2016, 12:27 PM

## 2016-01-02 NOTE — Consult Note (Signed)
Patient ID: Russell [name] MRN: [id number], DOB/AGE: 30-Oct-1923   Admit date: 12/31/2015   Primary Physician: Scarlette Calico, MD Primary Cardiologist: Dr. Caryl Comes   Pt. Profile:  82 y/o AAM with h/o combined systolic + diastolic HF, EF 123456 by recent echo 11/2015  PVCs, hypertension, hyperlipidemia, diabetes, prostate and colon cancer. Also with a h/o orthostasis which has limited medical therapy, who was recently admitted last month for a/c CHF, readmitted for recurrent  HF.   Problem List  Past Medical History:  Diagnosis Date  . Anxiety associated with depression   . Colon cancer [facility])    s/p resection 12/06  . Dementia   . Diabetes mellitus    diet mgt (11`/12)  . FTT (failure to thrive)    admited 08/2008  . History of cystoscopy    uretheral stricutre, 2005- Dr.Humphreys  . Hyperlipidemia   . Hypertension   . Loss of hearing    being fitted for hearing aids (11/12)  . Osteoarthritis    hands  . Pneumonia   . Prostate cancer ([place])    s/p XRT  . SBO (small bowel obstruction) ([place]) 03/08/2014  . Urolithiasis     Past Surgical History:  Procedure Laterality Date  . CATARACT EXTRACTION, BILATERAL    . HEMICOLECTOMY  12/06  . TRANSURETHRAL RESECTION OF PROSTATE    . xrt     ERT for prostate ca     Allergies  No Known Allergies  HPI  82 y/o [place] American male followed by Dr. Caryl Comes. He has a h/o combined systolic + diastolic HF, EF 123456 by recent echo 11/2015. He has no documented history of CAD. He is felt not to be a cath candidate. His history is also significant for PVCs, hypertension, hyperlipidemia, diabetes, prostate and colon cancer. Also with a h/o orthostasis which has limited medical therapy.   He was recently admitted 12/16/15 with a/c CHF. Cardiology was consulted 8/21. He was seen by Dr. Stanford Breed. He was treated with lasix and discharged home on 20 mg daily, PO, + 3.125 mg of Coreg BID. BP and renal function  did not allow room to initiate ACE-I. He was  discharged home on 8/24.  He presented [facility] to [facility] on [facility] with complaint of Dyspnea, cough, orthopnea, PND. BNP abnormal at 2,335. This is slightly up from previous BNP, which was 2,189. SCr is 1.30. BUN 26. CXR shows Cardiac enlargement with pulmonary vascular congestion, bilateral pleural effusions, and basilar atelectasis or infiltration. There is some improvement since previous study. He reports that he is resting comfortably now. No chest pain. Breathing on RA. Getting IV lasix. Diuresing well. -1.8L out.     Home Medications  Prior to Admission medications   Medication Sig Start Date End Date Taking? Authorizing Provider  ALPRAZolam (XANAX) 0.5 MG tablet TAKE 1 TABLET BY MOUTH THREE TIMES DAILY AS NEEDED FOR ANXIETY Patient taking differently: TAKE 1 TABLET EVERY MORNING 09/12/15  Yes Janith Lima, MD  aspirin 81 MG tablet Take 81 mg by mouth daily.     Yes Historical Provider, MD  Azelastine-Fluticasone (DYMISTA) 137-50 MCG/ACT SUSP Place 1 Act into the nose 2 (two) times daily. Patient taking differently: Place 1 spray into both nostrils 2 (two) times daily as needed (for allergies).  09/29/15  Yes Janith Lima, MD  carvedilol (COREG) 3.125 MG tablet Take 1 tablet (3.125 mg total) by mouth 2 (two) times daily with a meal. 12/22/15  Yes Barton Dubois, MD  Cyanocobalamin (VITAMIN B-12  PO) Take 1 tablet by mouth daily with breakfast.   Yes Historical Provider, MD  fesoterodine (TOVIAZ) 8 MG TB24 tablet Take 1 tablet (8 mg total) by mouth daily. 05/13/14  Yes Janith Lima, MD  furosemide (LASIX) 20 MG tablet Take 1 tablet (20 mg total) by mouth daily. 12/22/15  Yes Barton Dubois, MD  HYDROcodone-acetaminophen (NORCO/VICODIN) 5-325 MG tablet Take 1 tablet by mouth every 6 (six) hours as needed for moderate pain. 12/29/15  Yes Janith Lima, MD  polyethylene glycol [facility] / GLYCOLAX) packet Take 17 g by mouth daily. Patient taking differently: Take 17 g by mouth daily. [place] AND DRINK 11/29/15   Yes Janith Lima, MD  promethazine-dextromethorphan (PROMETHAZINE-DM) 6.25-15 MG/5ML syrup Take 5 mLs by mouth 4 (four) times daily as needed for cough. 12/29/15  Yes Janith Lima, MD  sertraline (ZOLOFT) 100 MG tablet TAKE 1 TABLET BY MOUTH DAILY Patient taking differently: Take 100 mg by mouth once daily 07/08/15  Yes Janith Lima, MD  simvastatin (ZOCOR) 80 MG tablet Take 40 mg by mouth daily at 6 PM.  07/16/15  Yes Historical Provider, MD    Family History  Family History  Problem Relation Age of Onset  . Diabetes Daughter   . Cancer Neg Hx   . Early death Neg Hx   . Heart disease Neg Hx   . Hyperlipidemia Neg Hx   . Hypertension Neg Hx   . Kidney disease Neg Hx   . Stroke Neg Hx   . Heart attack Neg Hx     Social History  Social History   Social History  . Marital status: Widowed    Spouse name: N/A  . Number of children: N/A  . Years of education: N/A   Occupational History  . Not on file.   Social History Main Topics  . Smoking status: Former Smoker    Quit date: 03/20/1967  . Smokeless tobacco: Never Used  . Alcohol use No     Comment: quit '68  . Drug use: No  . Sexual activity: Not Currently   Other Topics Concern  . Not on file   Social History Narrative   7th grade education. Married '51 - 9/09. 3 daughters- '51, '53, '66; 1 son- '54. Lives w/ daughter Dub Mikes who has schizophrenia since age 79. 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  Cardiovascular:  No chest pain, dyspnea on exertion, edema, orthopnea, palpitations, paroxysmal nocturnal dyspnea. Dermatological: No rash, lesions/masses Respiratory: No cough, dyspnea Urologic: No hematuria, dysuria Abdominal:   No nausea, vomiting, diarrhea, bright red blood per rectum, melena, or hematemesis Neurologic:  No visual changes, wkns, changes in mental status. All other systems reviewed and are otherwise  negative except as noted above.  Physical Exam  Blood pressure 113/70, pulse 68, temperature 98.1 F (36.7 C), temperature source Oral, resp. rate 18, weight 147 lb 8 oz (66.9 kg), SpO2 99 %.  General: Pleasant, NAD Psych: Normal affect. Neuro: Alert and oriented X 3. Moves all extremities spontaneously. HEENT: Normal  Neck: Supple without bruits or JVD. Lungs:  Resp regular and unlabored, CTA. Heart: RRR no s3, s4, or murmurs. Abdomen: Soft, non-tender, non-distended, BS + x 4.  Extremities: No clubbing, cyanosis or edema. DP/PT/Radials 2+ and equal bilaterally.  Labs  Troponin [facility] of Care Test) No results for input(s):  TROPIPOC in the last 72 hours.  Recent Labs  12/31/15 2350  TROPONINI 0.06*   Lab Results  Component Value Date   WBC 4.2 12/31/2015   HGB 13.7 12/31/2015   HCT 41.5 12/31/2015   MCV 87.7 12/31/2015   PLT 180 12/31/2015    Recent Labs Lab 12/31/15 2350 01/02/16 0218  NA 133* 137  K 4.6 3.7  CL 104 105  CO2 22 28  BUN 23* 26*  CREATININE 1.22 1.30*  CALCIUM 8.7* 8.3*  PROT 6.0*  --   BILITOT 0.5  --   ALKPHOS 95  --   ALT 29  --   AST 52*  --   GLUCOSE 163* 93   Lab Results  Component Value Date   CHOL 124 01/25/2015   HDL 46.50 01/25/2015   LDLCALC 63 01/25/2015   TRIG 74.0 01/25/2015   No results found for: DDIMER   Radiology/Studies  Dg Chest 2 View  Result Date: 12/16/2015 CLINICAL DATA:  Shortness of breath and congestion.  Pneumonia. EXAM: CHEST  2 VIEW COMPARISON:  Two-view chest x-ray 12/13/2015 FINDINGS: The heart is enlarged. Bilateral pleural effusions have increased. Pulmonary vascular congestion is present. Bibasilar airspace disease has increased as well. Atherosclerotic calcifications are present at the aortic arch. Degenerate changes are noted at both shoulders. The visualized soft tissues and bony thorax are unremarkable. IMPRESSION: 1. Cardiomegaly with increasing bilateral pleural effusions and bibasilar airspace  disease. This is most concerning for congestive heart failure. 2. Increasing pulmonary vascular congestion. 3. Bibasilar airspace disease likely reflects atelectasis. Early infection is not excluded. 4. Atherosclerosis of the thoracic aorta. Electronically Signed   By: San Morelle M.D.   On: 12/16/2015 17:19   Dg Chest 2 View  Result Date: 12/13/2015 CLINICAL DATA:  Cough, chest congestion, shortness of breath, and chest pain for the past 2 days; history of hypertension diabetes. History of CHF, community-acquired pneumonia, colonic and prostate malignancy. Remote history of smoking. EXAM: CHEST  2 VIEW COMPARISON:  PA and lateral chest x-ray of November 23, 2015 FINDINGS: The lungs are adequately inflated. There is bibasilar atelectasis or infiltrate with small bilateral pleural effusions more conspicuous than on the previous study. The cardiac silhouette remains enlarged. The pulmonary vascularity is mildly engorged but less conspicuous than on the previous study. There is calcification in the wall of the aortic arch. The observed bony thorax exhibits no acute abnormality. There is degenerative change of both shoulders. IMPRESSION: New bibasilar atelectasis or pneumonia is present. Persistent mild CHF with small bilateral pleural effusions. Followup PA and lateral chest X-ray is recommended in 3-4 weeks following trial of antibiotic therapy to ensure resolution and exclude underlying malignancy. Aortic atherosclerosis. Electronically Signed   By: David  Martinique M.D.   On: 12/13/2015 16:20   Dg Chest Port 1 View  Result Date: 01/01/2016 CLINICAL DATA:  Worsening shortness of breath and wheezing this evening. EXAM: PORTABLE CHEST 1 VIEW COMPARISON:  [id number] FINDINGS: Cardiac enlargement. Pulmonary vascularity is prominent. Bilateral pleural effusions with basilar atelectasis or infiltration. Changes demonstrate mild improvement since previous study. No pneumothorax. Calcification of the aorta. Degenerative  changes in the shoulders IMPRESSION: Cardiac enlargement with pulmonary vascular congestion, bilateral pleural effusions, and basilar atelectasis or infiltration. There is some improvement since previous study. Electronically Signed   By: Lucienne Capers M.D.   On: 01/01/2016 00:40    ECG  Sinus tach with PVCs   ASSESSMENT AND PLAN  Principal Problem:   Acute on chronic systolic CHF (congestive  heart failure) (HCC) Active Problems:   Essential hypertension, benign   Elevated troponin I level   Hyponatremia   Depression with anxiety   Acute exacerbation of congestive heart failure (HCC)   Acute on chronic systolic (congestive) heart failure (HCC)   CKD (chronic kidney disease), stage III   1. Acute on Chronic Combined Systolic + Diastolic CHF: EF 123456. ? If tachy mediated by frequent PVCs. Not a cath candidate given advanced age, renal function and prostate and colon cancer. BNP abnormal at 2,335. This is slightly up from previous BNP, which was 2,189. SCr is 1.30. BUN 26. CXR shows Cardiac enlargement with pulmonary vascular congestion, bilateral pleural effusions, and basilar atelectasis or infiltration. There is some improvement since previous study. He reports that he is resting comfortably now. No chest pain. Breathing on RA. Getting IV lasix. Diuresing well. -1.8L out. Renal function, K and BP ok. Volume appears stable, however given he bounced back so quickly, would recommend consideration of volume assessment with use of HF study vest to help guide diuresis. Continue Coreg and Bidil. Low sodium diet, daily weights + strict I/Os. Continue to monitor renal function, K and BP closely.    Signed, Lyda Jester, PA-C 01/02/2016, 12:43 PM  The patient was seen and examined, and I agree with the history, physical exam, assessment and plan as documented above which has been discussed with B. Simmons, with modifications as noted below. Pt feeling better and resting comfortably. Another  900 cc of urinary output noted yet to be recorded. Continue current medical therapy.  Would consider HF study vest.  Kate Sable, MD, [facility]  01/02/2016 2:04 PM

## 2016-01-02 NOTE — Progress Notes (Signed)
PROGRESS NOTE                                                                                                                                                                                                             Patient Demographics:    Russell [name], is a 82 y.o. male, DOB - 1924-04-13, [id number]  Admit date - 12/31/2015   Admitting Physician Vianne Bulls, MD  Outpatient Primary MD for the patient is Scarlette Calico, MD  LOS - 1  Chief Complaint  Patient presents with  . Shortness of Breath       Brief Narrative  Russell [name] is a 82 y.o. male with medical history significant for chronic systolic CHF with EF 0000000, mild dementia, depression, anxiety, colon cancer status post resection, and prostate cancer status post radiation therapy who presents to the emergency department for evaluation of dyspnea, cough, orthopnea, and PND. Patient was admitted to the hospital from 12/16/2015 - 12/22/2015 under similar circumstances and was treated for acute exacerbation and CHF. The was discharged home in much improved and stable condition after being net -3.4 L and losing close to 10 pounds. Now back with CHF.   Subjective:    Russell [name] today has, No headache, No chest pain, No abdominal pain - No Nausea, No new weakness tingling or numbness, No Cough - improved SOB.     Assessment  & Plan :    1.Acute on chronic combined systolic and diastolic CHF with EF 0000000 on recent echocardiogram with diffuse hypokinesis. Limited health education for patient and family members at home, continue IV Lasix along with Coreg, not on ACE or ARB with due to renal failure, have added BiDil, monitor intake and output and weight, shortness of breath is improved. - 2 lits  Filed Weights   01/01/16 0355 01/02/16 0500  Weight: 68.8 kg (151 lb 9.6 oz) 66.9 kg (147 lb 8 oz)    2. Dyslipidemia. Continue statin.  3. Hypertension. For now  Coreg and BiDil along with diuretics.  4. Overactive bladder. Continue Toviaz.  5. Chronic kidney disease stage IV. Baseline creatinine close to 1.4, monitor.    Family Communication  :  none  Code Status :  Full  Diet : Heart healthy  Disposition Plan  :  TBD  Consults  :  Cards in am  Procedures  :    TTE 11-2015  Left ventricle: LVEF is severely depressed with diffuse hypokinesis; mid/distal inferior and posterior akinesis and apical akinesis. Wall thickness was increased in a pattern of   mild LVH. Systolic function was severely reduced. The estimated ejection fraction was in the range of 20% to 25%. - Aortic valve: There was mild regurgitation. - Mitral valve: There was mild regurgitation. - Left atrium: The atrium was mildly dilated. - Right ventricle: The cavity size was mildly dilated. Systolic function was moderately to severely reduced. - Pericardium, extracardiac: A small pericardial effusion was identified.   DVT Prophylaxis  :    Heparin    Lab Results  Component Value Date   PLT 180 12/31/2015    Inpatient Medications  Scheduled Meds: . ALPRAZolam  0.5 mg Oral q morning - 10a  . aspirin EC  81 mg Oral Daily  . atorvastatin  40 mg Oral q1800  . carvedilol  3.125 mg Oral BID WC  . fesoterodine  8 mg Oral Daily  . furosemide  40 mg Intravenous BID  . heparin  5,000 Units Subcutaneous Q8H  . isosorbide-hydrALAZINE  1 tablet Oral TID  . polyethylene glycol  17 g Oral Daily  . sertraline  100 mg Oral Daily   Continuous Infusions:  PRN Meds:.acetaminophen, azelastine, dextromethorphan, fluticasone, HYDROcodone-acetaminophen, ondansetron (ZOFRAN) IV  Antibiotics  :    Anti-infectives    None         Objective:   Vitals:   01/01/16 0823 01/01/16 1202 01/01/16 2118 01/02/16 0500  BP: (!) 130/57 (!) 110/52 (!) 106/59 113/70  Pulse: 63 63 63 68  Resp:  20 18   Temp:  98.7 F (37.1 C) 98.2 F (36.8 C) 98.1 F (36.7 C)  TempSrc:  Oral Oral Oral   SpO2:  95% 99% 99%  Weight:    66.9 kg (147 lb 8 oz)    Wt Readings from Last 3 Encounters:  01/02/16 66.9 kg (147 lb 8 oz)  12/29/15 69.6 kg (153 lb 8 oz)  12/22/15 69.5 kg (153 lb 4.8 oz)     Intake/Output Summary (Last 24 hours) at 01/02/16 1157 Last data filed at 01/02/16 1100  Gross per 24 hour  Intake              700 ml  Output             2352 ml  Net            -1652 ml     Physical Exam  Awake Alert, Oriented X 3, No new F.N deficits, Normal affect [place].AT,PERRAL Supple Neck,No JVD, No cervical lymphadenopathy appriciated.  Symmetrical Chest wall movement, Good air movement bilaterally, few rales RRR,No Gallops,Rubs or new Murmurs, No Parasternal Heave +ve B.Sounds, Abd Soft, No tenderness, No organomegaly appriciated, No rebound - guarding or rigidity. No Cyanosis, Clubbing , trace edema, No new Rash or bruise      Data Review:    CBC  Recent Labs Lab 12/31/15 2350  WBC 4.2  HGB 13.7  HCT 41.5  PLT 180  MCV 87.7  MCH 29.0  MCHC 33.0  RDW 15.2  LYMPHSABS 0.8  MONOABS 0.4  EOSABS 0.2  BASOSABS 0.0    Chemistries   Recent Labs Lab 12/31/15 2350 01/02/16 0218  NA 133* 137  K 4.6 3.7  CL 104 105  CO2 22 28  GLUCOSE 163* 93  BUN 23* 26*  CREATININE 1.22 1.30*  CALCIUM 8.7* 8.3*  MG  --  1.7  AST 52*  --   ALT 29  --   ALKPHOS 95  --   BILITOT 0.5  --    ------------------------------------------------------------------------------------------------------------------ No results for input(s): CHOL, HDL, LDLCALC, TRIG, CHOLHDL, LDLDIRECT in the last 72 hours.  Lab Results  Component Value Date   HGBA1C 5.4 12/17/2015   ------------------------------------------------------------------------------------------------------------------ No results for input(s): TSH, T4TOTAL, T3FREE, THYROIDAB in the last 72 hours.  Invalid input(s):  FREET3 ------------------------------------------------------------------------------------------------------------------ No results for input(s): VITAMINB12, FOLATE, FERRITIN, TIBC, IRON, RETICCTPCT in the last 72 hours.  Coagulation profile No results for input(s): INR, PROTIME in the last 168 hours.  No results for input(s): DDIMER in the last 72 hours.  Cardiac Enzymes  Recent Labs Lab 12/31/15 2350  TROPONINI 0.06*   ------------------------------------------------------------------------------------------------------------------    Component Value Date/Time   BNP 2,335.5 (H) 12/31/2015 2350    Micro Results No results found for this or any previous visit (from the past 240 hour(s)).  Radiology Reports  Dg Chest Port 1 View  Result Date: 01/01/2016 CLINICAL DATA:  Worsening shortness of breath and wheezing this evening. EXAM: PORTABLE CHEST 1 VIEW COMPARISON:  8187 FINDINGS: Cardiac enlargement. Pulmonary vascularity is prominent. Bilateral pleural effusions with basilar atelectasis or infiltration. Changes demonstrate mild improvement since previous study. No pneumothorax. Calcification of the aorta. Degenerative changes in the shoulders IMPRESSION: Cardiac enlargement with pulmonary vascular congestion, bilateral pleural effusions, and basilar atelectasis or infiltration. There is some improvement since previous study. Electronically Signed   By: Lucienne Capers M.D.   On: 01/01/2016 00:40    Time Spent in minutes  30   Jerelle Virden K M.D on 01/02/2016 at 11:57 AM  Between 7am to 7pm - Pager - [phone]  After 7pm go to [email or web address] - password [facility]  [facility] -  Office  [phone]

## 2016-01-03 LAB — BASIC METABOLIC PANEL
ANION GAP: 8 (ref 5–15)
BUN: 27 mg/dL — ABNORMAL HIGH (ref 6–20)
CHLORIDE: 102 mmol/L (ref 101–111)
CO2: 28 mmol/L (ref 22–32)
CREATININE: 1.38 mg/dL — AB (ref 0.61–1.24)
Calcium: 8.5 mg/dL — ABNORMAL LOW (ref 8.9–10.3)
GFR calc non Af Amer: 43 mL/min — ABNORMAL LOW (ref >60)
GFR, EST AFRICAN AMERICAN: 50 mL/min — AB (ref >60)
Glucose, Bld: 83 mg/dL (ref 65–99)
POTASSIUM: 4.2 mmol/L (ref 3.5–5.1)
SODIUM: 138 mmol/L (ref 135–145)

## 2016-01-03 MED ORDER — MAGNESIUM SULFATE IN D5W 1-5 GM/100ML-% IV SOLN
1.0000 g | Freq: Once | INTRAVENOUS | Status: AC
  Administered 2016-01-03: 1 g via INTRAVENOUS
  Filled 2016-01-03 (×2): qty 100

## 2016-01-03 MED ORDER — FUROSEMIDE 40 MG PO TABS
40.0000 mg | ORAL_TABLET | Freq: Every day | ORAL | Status: DC
  Administered 2016-01-03 – 2016-01-04 (×2): 40 mg via ORAL
  Filled 2016-01-03 (×2): qty 1

## 2016-01-03 NOTE — Clinical Social Work Note (Signed)
CSW acknowledges SNF consult. PT eval recommending HHPT.  CSW signing off. Consult again if any social work needs arise.  Dayton Scrape, [place]

## 2016-01-03 NOTE — Progress Notes (Signed)
ReDS vest used to obtain fluid measurement.  Reading is 22-- which is below upper normal limit of 35.  Patient very SOB upon standing and sitting to fit vest.

## 2016-01-03 NOTE — Progress Notes (Signed)
Physical Therapy Treatment Patient Details Name: Russell [name] MRN: [id number] DOB: 1923-05-06 Today's Date: 01/16/16    History of Present Illness Patient is a 82 yo male admitted 12/31/15 with CHF exacerbation.    PMH:  CHF, EF 20-25%, mild dementia, depression, anxiety, HOH, HTN, CKD, DM    PT Comments    Patient tolerated ambulation without any signs of distress.  Seems distressed at the thought of rehab.  Spoke by phone with daughter and reported PT recommendations were for home with 24 hour assist and HHPT.  No changes in recommendations.  Will continue to follow acutely.  Follow Up Recommendations  Home health PT;Supervision/Assistance - 24 hour     Equipment Recommendations  None recommended by PT    Recommendations for Other Services       Precautions / Restrictions Precautions Precautions: Fall    Mobility  Bed Mobility Overal bed mobility: Needs Assistance       Supine to sit: Min assist     General bed mobility comments: assist to lift trunk upright, pt used rail with other hand  Transfers   Equipment used: Rolling walker (2 wheeled) Transfers: Sit to/from Stand Sit to Stand: Min assist            Ambulation/Gait Ambulation/Gait assistance: Field seismologist (Feet): 200 Feet Assistive device: Rolling walker (2 wheeled) Gait Pattern/deviations: Step-through pattern;Decreased stride length;Shuffle;Trunk flexed     General Gait Details: increased time with turns   Financial trader Rankin (Stroke Patients Only)       Balance Overall balance assessment: Needs assistance         Standing balance support: Bilateral upper extremity supported Standing balance-Leahy Scale: Poor Standing balance comment: reliant on UE support for balance                    Cognition Arousal/Alertness: Awake/alert Behavior During Therapy: Anxious (tearful stating someone said he had to go  to rehab and stated he does not want to go) Overall Cognitive Status: History of cognitive impairments - at baseline       Memory: Decreased short-term memory              Exercises      General Comments General comments (skin integrity, edema, etc.): SpO2 98% on RA during ambulation      Pertinent Vitals/Pain Pain Assessment: No/denies pain    Home Living                      Prior Function            PT Goals (current goals can now be found in the care plan section) Progress towards PT goals: Progressing toward goals    Frequency  Min 3X/week    PT Plan Current plan remains appropriate    Co-evaluation             End of Session Equipment Utilized During Treatment: Gait belt Activity Tolerance: Patient tolerated treatment well Patient left: in chair;with call bell/phone within reach;with chair alarm set     Time: [id number] PT Time Calculation (min) (ACUTE ONLY): 26 min  Charges:  $Gait Training: 23-37 mins                    G Codes:      Reginia Naas Jan 16, 2016, 11:49 AM  Magda Kiel, [place] 2016-01-16

## 2016-01-03 NOTE — Consult Note (Signed)
   [facility] CM Inpatient Consult   01/03/2016  Russell [name] 12-12-1923 [id number]  Patient is currently active with [facility] for chronic disease management services.  Patient has been engaged by a Research scientist (medical).  Patient was asleep but awakened to verbal stimuli.  He states his full name and DOB.  No family at the bedside, was trying to attempt to get a written consent form signed but, patient drifts back off to sleep.  Review of inpatient RNCM note, the patient lives home alone, has DME and an aide Mondays, Tuesdays, Thursdays and Fridays.  Patient with a HX with [facility]. Left a brochure with contact information on the bedside table.  Will plan to follow up.  For additional questions or referrals please contact:  Natividad Brood, RN BSN [facility] Liaison  [phone] business mobile phone Toll free office [phone]

## 2016-01-03 NOTE — Progress Notes (Signed)
PROGRESS NOTE                                                                                                                                                                                                             Patient Demographics:    Russell [name], is a 82 y.o. male, DOB - [birth year], [id number]  Admit date - 12/31/2015   Admitting Physician Vianne Bulls, MD  Outpatient Primary MD for the patient is Scarlette Calico, MD  LOS - 2  Chief Complaint  Patient presents with  . Shortness of Breath       Brief Narrative  Russell [name] is a 82 y.o. male with medical history significant for chronic systolic CHF with EF 0000000, mild dementia, depression, anxiety, colon cancer status post resection, and prostate cancer status post radiation therapy who presents to the emergency department for evaluation of dyspnea, cough, orthopnea, and PND. Patient was admitted to the hospital from 12/16/2015 - 12/22/2015 under similar circumstances and was treated for acute exacerbation and CHF. The was discharged home in much improved and stable condition after being net -3.4 L and losing close to 10 pounds. Now back with CHF.   Subjective:    Russell [name] today has, Russell headache, Russell chest pain, Russell abdominal pain - Russell Nausea, Russell new weakness tingling or numbness, Russell Cough - improved SOB.     Assessment  & Plan :    1.Acute on chronic combined systolic and diastolic CHF with EF 0000000 on recent echocardiogram with diffuse hypokinesis. Limited health education for patient and family members at home, continue IV Lasix along with Coreg, not on ACE or ARB with due to renal failure, have added BiDil, monitor intake and output and weight, shortness of breath is improved. - 3 lits  Filed Weights   01/01/16 0355 01/02/16 0500 01/03/16 0300  Weight: 68.8 kg (151 lb 9.6 oz) 66.9 kg (147 lb 8 oz) 66.4 kg (146 lb 4.8 oz)    2. Dyslipidemia. Continue  statin.  3. Hypertension. For now Coreg and BiDil along with diuretics.  4. Overactive bladder. Continue Toviaz.  5. Chronic kidney disease stage IV. Baseline creatinine close to 1.4, monitor.    Family Communication  :  none  Code Status :  Full  Diet : Heart healthy  Disposition Plan  :  TBD  Consults  :  Cards in am  Procedures  :    TTE 11-2015  Left ventricle: LVEF is severely depressed with diffuse hypokinesis; mid/distal inferior and posterior akinesis and apical akinesis. Wall thickness was increased in a pattern of   mild LVH. Systolic function was severely reduced. The estimated ejection fraction was in the range of 20% to 25%. - Aortic valve: There was mild regurgitation. - Mitral valve: There was mild regurgitation. - Left atrium: The atrium was mildly dilated. - Right ventricle: The cavity size was mildly dilated. Systolic function was moderately to severely reduced. - Pericardium, extracardiac: A small pericardial effusion was identified.   DVT Prophylaxis  :    Heparin    Lab Results  Component Value Date   PLT 180 12/31/2015    Inpatient Medications  Scheduled Meds: . ALPRAZolam  0.5 mg Oral q morning - 10a  . aspirin EC  81 mg Oral Daily  . atorvastatin  40 mg Oral q1800  . carvedilol  3.125 mg Oral BID WC  . fesoterodine  8 mg Oral Daily  . furosemide  40 mg Intravenous BID  . heparin  5,000 Units Subcutaneous Q8H  . isosorbide-hydrALAZINE  1 tablet Oral TID  . polyethylene glycol  17 g Oral Daily  . sertraline  100 mg Oral Daily   Continuous Infusions:  PRN Meds:.acetaminophen, azelastine, dextromethorphan, fluticasone, HYDROcodone-acetaminophen, ondansetron (ZOFRAN) IV  Antibiotics  :    Anti-infectives    None         Objective:   Vitals:   01/02/16 2032 01/02/16 2330 01/03/16 0300 01/03/16 0454  BP: (!) 99/52 125/65  (!) 116/59  Pulse: 67 66  66  Resp: 18   20  Temp: 97.4 F (36.3 C)   98.8 F (37.1 C)  TempSrc: Oral    Oral  SpO2: 95%   94%  Weight:   66.4 kg (146 lb 4.8 oz)     Wt Readings from Last 3 Encounters:  01/03/16 66.4 kg (146 lb 4.8 oz)  12/29/15 69.6 kg (153 lb 8 oz)  12/22/15 69.5 kg (153 lb 4.8 oz)     Intake/Output Summary (Last 24 hours) at 01/03/16 1050 Last data filed at 01/03/16 0948  Gross per 24 hour  Intake              720 ml  Output             2001 ml  Net            -1281 ml     Physical Exam  Awake Alert, Oriented X 3, Russell new F.N deficits, Normal affect Russell [name],Russell [name],Russell [name], Russell cervical lymphadenopathy [name].  Symmetrical Chest wall [name], Russell [name], Russell [name],Russell [name],Russell [name], Russell [name], Russell [name], Russell [name], Russell [name], Russell rebound - guarding or rigidity. Russell Cyanosis, Clubbing , trace edema, Russell new Rash or bruise      Data Review:    CBC  Recent Labs Lab 12/31/15 2350  WBC 4.2  HGB 13.7  HCT 41.5  PLT 180  MCV 87.7  MCH 29.0  MCHC 33.0  RDW 15.2  LYMPHSABS 0.8  MONOABS 0.4  EOSABS 0.2  BASOSABS 0.0    Chemistries   Recent Labs Lab 12/31/15 2350 01/02/16 0218 01/03/16 0319  NA 133* 137 138  K 4.6 3.7 4.2  CL 104 105 102  CO2 22 28 28   GLUCOSE 163* 93 83  BUN 23* 26* 27*  CREATININE 1.22 1.30* 1.38*  CALCIUM 8.7* 8.3* 8.5*  MG  --  1.7  --   AST 52*  --   --   ALT 29  --   --   ALKPHOS 95  --   --   BILITOT 0.5  --   --    ------------------------------------------------------------------------------------------------------------------ Russell results for input(s): CHOL, HDL, LDLCALC, TRIG, CHOLHDL, LDLDIRECT in the last 72 hours.  Lab Results  Component Value Date   HGBA1C 5.4 12/17/2015   ------------------------------------------------------------------------------------------------------------------ Russell results for input(s): TSH, T4TOTAL, T3FREE, THYROIDAB in the last 72 hours.  Invalid input(s):  FREET3 ------------------------------------------------------------------------------------------------------------------ Russell results for input(s): VITAMINB12, FOLATE, FERRITIN, TIBC, IRON, RETICCTPCT in the last 72 hours.  Coagulation profile Russell results for input(s): INR, PROTIME in the last 168 hours.  Russell results for input(s): DDIMER in the last 72 hours.  Cardiac Enzymes  Recent Labs Lab 12/31/15 2350  TROPONINI 0.06*   ------------------------------------------------------------------------------------------------------------------    Component Value Date/Time   BNP 2,335.5 (H) 12/31/2015 2350    Micro Results Russell results found for this or any previous visit (from the past 240 hour(s)).  Radiology Reports  Dg Chest Port 1 View  Result Date: 01/01/2016 CLINICAL DATA:  Worsening shortness of breath and wheezing this evening. EXAM: PORTABLE CHEST 1 VIEW COMPARISON:  8187 FINDINGS: Cardiac enlargement. Pulmonary vascularity is prominent. Bilateral pleural effusions with basilar atelectasis or infiltration. Changes demonstrate mild improvement since previous study. Russell pneumothorax. Calcification of the aorta. Degenerative changes in the shoulders IMPRESSION: Cardiac enlargement with pulmonary vascular congestion, bilateral pleural effusions, and basilar atelectasis or infiltration. There is some improvement since previous study. Electronically Signed   By: Lucienne Capers M.D.   On: 01/01/2016 00:40    Time Spent in minutes  30   Syaire Saber K M.D on 01/03/2016 at 10:50 AM  Between 7am to 7pm - Pager - [phone]  After 7pm go to [email or web address] - password [facility]  [facility] -  Office  [phone]

## 2016-01-03 NOTE — Progress Notes (Addendum)
     SUBJECTIVE: Breathing is better.   BP (!) 116/59 (BP Location: Left Arm)   Pulse 66   Temp 98.8 F (37.1 C) (Oral)   Resp 20   Wt 146 lb 4.8 oz (66.4 kg)   SpO2 94%   BMI 22.91 kg/m   Intake/Output Summary (Last 24 hours) at 01/03/16 0920 Last data filed at 01/03/16 0152  Gross per 24 hour  Intake              480 ml  Output             2001 ml  Net            -1521 ml    PHYSICAL EXAM General: Thin, cachectic elderly male. No acute distress. Alert and oriented x 3.  Psych:  Good affect, responds appropriately Neck: No JVD. No masses noted.  Lungs: Clear bilaterally with no wheezes or rhonci noted.  Heart: RRR with no murmurs noted. Abdomen: Bowel sounds are present. Soft, non-tender.  Extremities: No lower extremity edema.   LABS: Basic Metabolic Panel:  Recent Labs  01/02/16 0218 01/03/16 0319  NA 137 138  K 3.7 4.2  CL 105 102  CO2 28 28  GLUCOSE 93 83  BUN 26* 27*  CREATININE 1.30* 1.38*  CALCIUM 8.3* 8.5*  MG 1.7  --    CBC:  Recent Labs  12/31/15 2350  WBC 4.2  NEUTROABS 2.7  HGB 13.7  HCT 41.5  MCV 87.7  PLT 180   Cardiac Enzymes:  Recent Labs  12/31/15 2350  TROPONINI 0.06*    Current Meds: . ALPRAZolam  0.5 mg Oral q morning - 10a  . aspirin EC  81 mg Oral Daily  . atorvastatin  40 mg Oral q1800  . carvedilol  3.125 mg Oral BID WC  . fesoterodine  8 mg Oral Daily  . furosemide  40 mg Intravenous BID  . heparin  5,000 Units Subcutaneous Q8H  . isosorbide-hydrALAZINE  1 tablet Oral TID  . polyethylene glycol  17 g Oral Daily  . sertraline  100 mg Oral Daily     ASSESSMENT AND PLAN: 82 yo AAM with h/o combined systolic + diastolic CHF, EF 123456 by echo 11/2015 PVCs, hypertension, hyperlipidemia, diabetes, prostate and colon cancer and orthostatic hypotension which has limited medical therapy who is admitted with recurrent CHF.   1. Acute on chronic systolic and diastolic CHF: He is diuresing well on IV Lasix. He is negative  3.2 liters since admission. His weight is down from 153 lbs at time of discharge on 12/22/15 to 146 lbs today. His breathing is better. He reports no dyspnea this am. He seems to be euvolemic on exam. The weekend rounding team has recommended contacting the CHF team to assist with non-invasive fluid status assessment (Vest). Will contact the CHF team. Will continue Coreg and Bidil. Will make further recs on Lasix after I discuss with the CHF team.   Vest reading is 22 suggesting he is euvolemic. Would change to Lasix 40 mg po daily. He is probably ready for d/c. He will need close cardiology follow up, one week.   Will sign off. Please call with questions.    Lauree Chandler  9/5/20179:20 AM

## 2016-01-04 ENCOUNTER — Ambulatory Visit: Payer: Self-pay | Admitting: Internal Medicine

## 2016-01-04 LAB — BASIC METABOLIC PANEL
ANION GAP: 14 (ref 5–15)
BUN: 26 mg/dL — ABNORMAL HIGH (ref 6–20)
CALCIUM: 8 mg/dL — AB (ref 8.9–10.3)
CO2: 24 mmol/L (ref 22–32)
Chloride: 97 mmol/L — ABNORMAL LOW (ref 101–111)
Creatinine, Ser: 1.31 mg/dL — ABNORMAL HIGH (ref 0.61–1.24)
GFR calc non Af Amer: 46 mL/min — ABNORMAL LOW (ref >60)
GFR, EST AFRICAN AMERICAN: 54 mL/min — AB (ref >60)
Glucose, Bld: 90 mg/dL (ref 65–99)
Potassium: 3.4 mmol/L — ABNORMAL LOW (ref 3.5–5.1)
SODIUM: 135 mmol/L (ref 135–145)

## 2016-01-04 LAB — MAGNESIUM: MAGNESIUM: 1.9 mg/dL (ref 1.7–2.4)

## 2016-01-04 MED ORDER — ISOSORB DINITRATE-HYDRALAZINE 20-37.5 MG PO TABS: 1.0000 | ORAL_TABLET | Freq: Three times a day (TID) | ORAL | 0 refills | Status: DC

## 2016-01-04 MED ORDER — POTASSIUM CHLORIDE CRYS ER 20 MEQ PO TBCR: 20.0000 meq | EXTENDED_RELEASE_TABLET | Freq: Every day | ORAL | 0 refills | Status: DC

## 2016-01-04 MED ORDER — FUROSEMIDE 40 MG PO TABS: 40.0000 mg | ORAL_TABLET | Freq: Every day | ORAL | 0 refills | Status: DC

## 2016-01-04 MED ORDER — POTASSIUM CHLORIDE CRYS ER 20 MEQ PO TBCR
40.0000 meq | EXTENDED_RELEASE_TABLET | Freq: Four times a day (QID) | ORAL | Status: DC
  Administered 2016-01-04: 40 meq via ORAL
  Filled 2016-01-04: qty 2

## 2016-01-04 NOTE — Care Management Important Message (Signed)
Important Message  Patient Details  Name: Russell [name] MRN: [id number] Date of Birth: 30-Aug-1923   Medicare Important Message Given:  Yes    Cuca Benassi Montine Circle 01/04/2016, 9:36 AM

## 2016-01-04 NOTE — Consult Note (Signed)
   [facility] CM Inpatient Consult   01/04/2016  Rydan Gulyas [birth year] [id number]  Patient is currently active with [facility] for chronic disease management services.  Patient has been engaged by a International aid/development worker.  Met with patient and his daughter Cyndra Numbers at the bedside regarding services through his [employer] plan.  Cyndra Numbers, [place] and she wanted this Probation officer to speak with [name] (patient's daughter as shared POA).  Spoke with [name], HIPAA verified via phone. Consent form signed and folder given with contact information.    Patient will receive a post discharge transition of care call and will be evaluated for monthly home visits for assessments and disease process education.  Made Inpatient Case Manager aware that [facility] following. Of note, [facility] Care Management services does not replace or interfere with any services that are needed or arranged by inpatient case management or social work.  For additional questions or referrals please contact:  Natividad Brood, RN BSN [facility] Liaison  [phone] business mobile phone Toll free office [phone]

## 2016-01-04 NOTE — Discharge Instructions (Signed)
Follow with Primary MD Scarlette Calico, MD and your primary cardiologist in 7 days   Get CBC, CMP, 2 view Chest X ray checked  by Primary MD or SNF MD in 5-7 days ( we routinely change or add medications that can affect your baseline labs and fluid status, therefore we recommend that you get the mentioned basic workup next visit with your PCP, your PCP may decide not to get them or add new tests based on their clinical decision)   Activity: As tolerated with Full fall precautions use walker/cane & assistance as needed   Disposition Home     Diet:   Heart Healthy   Check your Weight same time everyday, if you gain over 2 pounds, or you develop in leg swelling, experience more shortness of breath or chest pain, call your Primary MD immediately. Follow Cardiac Low Salt Diet and 1.5 lit/day fluid restriction.   On your next visit with your primary care physician please Get Medicines reviewed and adjusted.   Please request your Prim.MD to go over all Hospital Tests and Procedure/Radiological results at the follow up, please get all Hospital records sent to your Prim MD by signing hospital release before you go home.   If you experience worsening of your admission symptoms, develop shortness of breath, life threatening emergency, suicidal or homicidal thoughts you must seek medical attention immediately by calling 911 or calling your MD immediately  if symptoms less severe.  You Must read complete instructions/literature along with all the possible adverse reactions/side effects for all the Medicines you take and that have been prescribed to you. Take any new Medicines after you have completely understood and accpet all the possible adverse reactions/side effects.   Do not drive, operate heavy machinery, perform activities at heights, swimming or participation in water activities or provide baby sitting services if your were admitted for syncope or siezures until you have seen by Primary MD or a  Neurologist and advised to do so again.  Do not drive when taking Pain medications.    Do not take more than prescribed Pain, Sleep and Anxiety Medications  Special Instructions: If you have smoked or chewed Tobacco  in the last 2 yrs please stop smoking, stop any regular Alcohol  and or any Recreational drug use.  Wear Seat belts while driving.   Please note  You were cared for by a hospitalist during your hospital stay. If you have any questions about your discharge medications or the care you received while you were in the hospital after you are discharged, you can call the unit and asked to speak with the hospitalist on call if the hospitalist that took care of you is not available. Once you are discharged, your primary care physician will handle any further medical issues. Please note that NO REFILLS for any discharge medications will be authorized once you are discharged, as it is imperative that you return to your primary care physician (or establish a relationship with a primary care physician if you do not have one) for your aftercare needs so that they can reassess your need for medications and monitor your lab values.

## 2016-01-04 NOTE — Care Management Note (Addendum)
Case Management Note  Patient Details  Name: Russell [name] MRN: [id number] Date of Birth: 1923/06/18  Subjective/Objective:  Spoke with patient and his daughter, Russell [name], with his verbal permission given at the bedside. Russell [name] tells me she stays with her father and assists him along with her sister. Does not wish to have SNF at this time and is in agreement with resumption of HHPT and Aide. No HHRN at this time. Aware they can call PCP if they change their minds.                  Action/Plan:CM contacted, Manuela Schwartz,  [facility] rep to confirm HHPT with [facility] at discharge. This is a reumption of care with [facility].   Anticipate discharge home with HHPT today. No further CM needs but will be available should additional discharge needs arise.  Choice offered to:  Patient, Adult Children  DME Arranged:  N/A DME Agency:  NA  HH Arranged:  PT (MD ordered [facility] and Aide but family is requesting only HHPT.) [place]:  [place]  Status of Service:  Completed, signed off  If discussed at [place] of Stay Meetings, dates discussed:    Additional Comments:  Delrae Sawyers, RN 01/04/2016, 10:12 AM

## 2016-01-04 NOTE — Discharge Summary (Signed)
Russell [name] [id number] DOB: [birth year] DOA: 12/31/2015  PCP: Scarlette Calico, MD  Admit date: 12/31/2015  Discharge date: 01/04/2016  Admitted From: Home  Disposition:  Home   Recommendations for Outpatient Follow-up:   Follow up with PCP in 1-2 weeks  PCP Please obtain BMP/CBC, 2 view CXR in 1week,  (see Discharge instructions)   PCP Please follow up on the following pending results: None   Home Health: HHPT-RN   Equipment/Devices: None  Consultations: Cards Discharge Condition: Fair   CODE STATUS: Full   Diet Recommendation: Heart healthy with 1.5 L total fluid restriction daily   Chief Complaint  Patient presents with  . Shortness of Breath     Brief history of present illness from the day of admission and additional interim summary    Russell [name] a 82 y.o.malewith medical history significant for chronic systolic CHF with EF 0000000, mild dementia, depression, anxiety, colon cancer status post resection, and prostate cancer status post radiation therapy who presents to the emergency department for evaluation of dyspnea, cough, orthopnea, and PND. Patient was admitted to the hospital from 12/16/2015 - 12/22/2015 under similar circumstances and was treated for acute exacerbation and CHF. The was discharged home in much improved and stable condition after being net-3.4 L and losing close to 10 pounds. Now back with CHF  Hospital issues addressed     1.Acute on chronic combined systolic and diastolic CHF with EF 0000000 on recent echocardiogram with diffuse hypokinesis. Limited health education for patient and family members at home, As diuresed with IV Lasix and switch to oral Lasix yesterday by cardiology, now close to baseline and asymptomatic, increase home dose Lasix from 20 mg daily to 40 mg daily,  not on ACE or ARB with due to renal failure, have added BiDil, he is now symptom-free and eager to go home, will be discharged with outpatient PCP and primary cardiologist follow-up. Request PCP to check blood pressure, weight, BMP in 5-7 days.  2. Dyslipidemia. Continue statin.  3. Hypertension. For now Coreg and BiDil along with diuretics.  4. Overactive bladder. Continue Toviaz.  5. Chronic kidney disease stage IV. Baseline creatinine close to 1.4, stable no acute issues.  6. Hypokalemia. Replaced. Request PCP to recheck next visit.   Discharge diagnosis     Principal Problem:   Acute on chronic systolic CHF (congestive heart failure) (HCC) Active Problems:   Essential hypertension, benign   Elevated troponin I level   Hyponatremia   Depression with anxiety   Acute exacerbation of congestive heart failure (HCC)   Acute on chronic systolic (congestive) heart failure (HCC)   CKD (chronic kidney disease), stage III    Discharge instructions    Discharge Instructions    Discharge instructions    Complete by:  As directed   Follow with Primary MD Scarlette Calico, MD and your primary cardiologist in 7 days   Get CBC, CMP, 2 view Chest X ray checked  by Primary MD or SNF MD in 5-7 days ( we routinely change or add  medications that can affect your baseline labs and fluid status, therefore we recommend that you get the mentioned basic workup next visit with your PCP, your PCP may decide not to get them or add new tests based on their clinical decision)   Activity: As tolerated with Full fall precautions use walker/cane & assistance as needed   Disposition Home     Diet:   Heart Healthy   Check your Weight same time everyday, if you gain over 2 pounds, or you develop in leg swelling, experience more shortness of breath or chest pain, call your Primary MD immediately. Follow Cardiac Low Salt Diet and 1.5 lit/day fluid restriction.   On your next visit with your primary care  physician please Get Medicines reviewed and adjusted.   Please request your Prim.MD to go over all Hospital Tests and Procedure/Radiological results at the follow up, please get all Hospital records sent to your Prim MD by signing hospital release before you go home.   If you experience worsening of your admission symptoms, develop shortness of breath, life threatening emergency, suicidal or homicidal thoughts you must seek medical attention immediately by calling 911 or calling your MD immediately  if symptoms less severe.  You Must read complete instructions/literature along with all the possible adverse reactions/side effects for all the Medicines you take and that have been prescribed to you. Take any new Medicines after you have completely understood and accpet all the possible adverse reactions/side effects.   Do not drive, operate heavy machinery, perform activities at heights, swimming or participation in water activities or provide baby sitting services if your were admitted for syncope or siezures until you have seen by Primary MD or a Neurologist and advised to do so again.  Do not drive when taking Pain medications.    Do not take more than prescribed Pain, Sleep and Anxiety Medications  Special Instructions: If you have smoked or chewed Tobacco  in the last 2 yrs please stop smoking, stop any regular Alcohol  and or any Recreational drug use.  Wear Seat belts while driving.   Please note  You were cared for by a hospitalist during your hospital stay. If you have any questions about your discharge medications or the care you received while you were in the hospital after you are discharged, you can call the unit and asked to speak with the hospitalist on call if the hospitalist that took care of you is not available. Once you are discharged, your primary care physician will handle any further medical issues. Please note that NO REFILLS for any discharge medications will be  authorized once you are discharged, as it is imperative that you return to your primary care physician (or establish a relationship with a primary care physician if you do not have one) for your aftercare needs so that they can reassess your need for medications and monitor your lab values.   Increase activity slowly    Complete by:  As directed      Discharge Medications     Medication List    TAKE these medications   ALPRAZolam 0.5 MG tablet Commonly known as:  XANAX TAKE 1 TABLET BY MOUTH THREE TIMES DAILY AS NEEDED FOR ANXIETY What changed:  See the new instructions.   aspirin 81 MG tablet Take 81 mg by mouth daily.   Azelastine-Fluticasone 137-50 MCG/ACT Susp Commonly known as:  DYMISTA Place 1 Act into the nose 2 (two) times daily. What changed:  how much to take  how to take this  when to take this  reasons to take this   carvedilol 3.125 MG tablet Commonly known as:  COREG Take 1 tablet (3.125 mg total) by mouth 2 (two) times daily with a meal.   fesoterodine 8 MG Tb24 tablet Commonly known as:  TOVIAZ Take 1 tablet (8 mg total) by mouth daily.   furosemide 40 MG tablet Commonly known as:  LASIX Take 1 tablet (40 mg total) by mouth daily. What changed:  medication strength  how much to take   HYDROcodone-acetaminophen 5-325 MG tablet Commonly known as:  NORCO/VICODIN Take 1 tablet by mouth every 6 (six) hours as needed for moderate pain.   isosorbide-hydrALAZINE 20-37.5 MG tablet Commonly known as:  BIDIL Take 1 tablet by mouth 3 (three) times daily.   polyethylene glycol packet Commonly known as:  MIRALAX / GLYCOLAX Take 17 g by mouth daily. What changed:  additional instructions   potassium chloride SA 20 MEQ tablet Commonly known as:  K-DUR,KLOR-CON Take 1 tablet (20 mEq total) by mouth daily.   promethazine-dextromethorphan 6.25-15 MG/5ML syrup Commonly known as:  PROMETHAZINE-DM Take 5 mLs by mouth 4 (four) times daily as needed for  cough.   sertraline 100 MG tablet Commonly known as:  ZOLOFT TAKE 1 TABLET BY MOUTH DAILY What changed:  See the new instructions.   simvastatin 80 MG tablet Commonly known as:  ZOCOR Take 40 mg by mouth daily at 6 PM.   VITAMIN B-12 PO Take 1 tablet by mouth daily with breakfast.       Follow-up Information    Scarlette Calico, MD. Schedule an appointment as soon as possible for a visit in 1 week(s).   Specialty:  Internal Medicine Contact information: [address] [address] [phone]        Virl Axe, MD. Schedule an appointment as soon as possible for a visit in 1 week(s).   Specialty:  Cardiology Contact information: [id number] [place] [address] [phone]           Major procedures and Radiology Reports - PLEASE review detailed and final reports thoroughly  -     TTE 11-2015  Left ventricle: LVEF is severely depressed with diffusehypokinesis; mid/distal inferior and posterior akinesis andapical akinesis. Wall thickness was increased in a pattern of mild LVH. Systolic function was severely reduced. The estimatedejection fraction was in the range of 20% to 25%. - Aortic valve: There was mild regurgitation. - Mitral valve: There was mild regurgitation. - Left atrium: The atrium was mildly dilated. - Right ventricle: The cavity size was mildly dilated. Systolic function was moderately to severely reduced. - Pericardium, extracardiac: A small pericardial effusion wasidentified.   Dg Chest 2 View  Result Date: 12/16/2015 CLINICAL DATA:  Shortness of breath and congestion.  Pneumonia. EXAM: CHEST  2 VIEW COMPARISON:  Two-view chest x-ray 12/13/2015 FINDINGS: The heart is enlarged. Bilateral pleural effusions have increased. Pulmonary vascular congestion is present. Bibasilar airspace disease has increased as well. Atherosclerotic calcifications are present at the aortic arch. Degenerate changes are  noted at both shoulders. The visualized soft tissues and bony thorax are unremarkable. IMPRESSION: 1. Cardiomegaly with increasing bilateral pleural effusions and bibasilar airspace disease. This is most concerning for congestive heart failure. 2. Increasing pulmonary vascular congestion. 3. Bibasilar airspace disease likely reflects atelectasis. Early infection is not excluded. 4. Atherosclerosis of the thoracic aorta. Electronically Signed   By: San Morelle M.D.   On:  12/16/2015 17:19   Dg Chest 2 View  Result Date: 12/13/2015 CLINICAL DATA:  Cough, chest congestion, shortness of breath, and chest pain for the past 2 days; history of hypertension diabetes. History of CHF, community-acquired pneumonia, colonic and prostate malignancy. Remote history of smoking. EXAM: CHEST  2 VIEW COMPARISON:  PA and lateral chest x-ray of November 23, 2015 FINDINGS: The lungs are adequately inflated. There is bibasilar atelectasis or infiltrate with small bilateral pleural effusions more conspicuous than on the previous study. The cardiac silhouette remains enlarged. The pulmonary vascularity is mildly engorged but less conspicuous than on the previous study. There is calcification in the wall of the aortic arch. The observed bony thorax exhibits no acute abnormality. There is degenerative change of both shoulders. IMPRESSION: New bibasilar atelectasis or pneumonia is present. Persistent mild CHF with small bilateral pleural effusions. Followup PA and lateral chest X-ray is recommended in 3-4 weeks following trial of antibiotic therapy to ensure resolution and exclude underlying malignancy. Aortic atherosclerosis. Electronically Signed   By: David  Martinique M.D.   On: 12/13/2015 16:20   Dg Chest Port 1 View  Result Date: 01/01/2016 CLINICAL DATA:  Worsening shortness of breath and wheezing this evening. EXAM: PORTABLE CHEST 1 VIEW COMPARISON:  [id number] FINDINGS: Cardiac enlargement. Pulmonary vascularity is prominent.  Bilateral pleural effusions with basilar atelectasis or infiltration. Changes demonstrate mild improvement since previous study. No pneumothorax. Calcification of the aorta. Degenerative changes in the shoulders IMPRESSION: Cardiac enlargement with pulmonary vascular congestion, bilateral pleural effusions, and basilar atelectasis or infiltration. There is some improvement since previous study. Electronically Signed   By: Lucienne Capers M.D.   On: 01/01/2016 00:40    Micro Results     No results found for this or any previous visit (from the past 240 hour(s)).  Today   Subjective    Ishmael Paller today has no headache,no chest abdominal pain,no new weakness tingling or numbness, feels much better wants to go home today.     Objective   Blood pressure 127/63, pulse 60, temperature 98.5 F (36.9 C), temperature source Oral, resp. rate 20, weight 66.4 kg (146 lb 4.8 oz), SpO2 98 %.   Intake/Output Summary (Last 24 hours) at 01/04/16 0942 Last data filed at 01/04/16 0529  Gross per 24 hour  Intake              780 ml  Output             1100 ml  Net             -320 ml    Exam Awake Alert, Oriented x 3, No new F.N deficits, Normal affect [place].AT,PERRAL Supple Neck,No JVD, No cervical lymphadenopathy appriciated.  Symmetrical Chest wall movement, Good air movement bilaterally, CTAB RRR,No Gallops,Rubs or new Murmurs, No Parasternal Heave +ve B.Sounds, Abd Soft, Non tender, No organomegaly appriciated, No rebound -guarding or rigidity. No Cyanosis, Clubbing or edema, No new Rash or bruise   Data Review   CBC w Diff: Lab Results  Component Value Date   WBC 4.2 12/31/2015   HGB 13.7 12/31/2015   HGB 14.3 09/20/2006   HCT 41.5 12/31/2015   HCT 41.4 09/20/2006   PLT 180 12/31/2015   PLT 219 09/20/2006   LYMPHOPCT 20 12/31/2015   LYMPHOPCT 16.5 09/20/2006   MONOPCT 10 12/31/2015   MONOPCT 16.6 (H) 09/20/2006   EOSPCT 6 12/31/2015   EOSPCT 9.7 (H) 09/20/2006   BASOPCT 0  12/31/2015   BASOPCT 0.4 09/20/2006  CMP: Lab Results  Component Value Date   NA 135 01/04/2016   K 3.4 (L) 01/04/2016   CL 97 (L) 01/04/2016   CO2 24 01/04/2016   BUN 26 (H) 01/04/2016   CREATININE 1.31 (H) 01/04/2016   PROT 6.0 (L) 12/31/2015   ALBUMIN 3.3 (L) 12/31/2015   BILITOT 0.5 12/31/2015   ALKPHOS 95 12/31/2015   AST 52 (H) 12/31/2015   ALT 29 12/31/2015  .   Total Time in preparing paper work, data evaluation and todays exam - 35 minutes  Thurnell Lose M.D on 01/04/2016 at 9:42 AM  [facility]   Office  [phone]

## 2016-01-06 ENCOUNTER — Encounter: Payer: Self-pay | Admitting: *Deleted

## 2016-01-06 ENCOUNTER — Other Ambulatory Visit: Payer: Self-pay | Admitting: *Deleted

## 2016-01-06 ENCOUNTER — Telehealth: Payer: Self-pay | Admitting: *Deleted

## 2016-01-06 NOTE — Telephone Encounter (Signed)
Tried Scientist, research (medical) (Mrs. Russell [name]) concerning TCM hosp appt. No answer LMOM RTC . Per chart appt was scheduled by the hosp needing to verify if pt/family is aware & complete TCM call...[name]

## 2016-01-06 NOTE — Patient Outreach (Signed)
[name]) Care Management [place] Telephone Outreach, Transition of Care day 1 01/06/2016  Russell [name] 11-29-1923 [id number]  Successful telephone outreach to Russell [name], daughter/caregiver, on [facility] written consent, for patient Russell [name], 82 y/o male referred to [place] for transition of care after recent hospitalization September 2-6, 2017 for dyspnea, cough, orthopnea, and CHF exacerbation.  Patient was diuresed during hospital visit and discharged home with Home Health [facility]) services for RN and PT.  Patient has history of HTN, CHF with EF of 20-25%, mild dementia and CKD.  Patient has had two recent hospitalizations for CHF exacerbation.  HIPPA/ identity verified through [facility].  Russell [name] reports today that patient is doing well after his recent discharge home from the hospital.  I went through each page of patient's AVS/ discharge summary with Russell [name] today.  Medications: Caregiver Russell [name] reports patient has all of his medication and is able to take independently, without difficulty.  Caregiver supervises medications and uses pill box.  Medication reconciliation performed during today's phone call; patient was recently discharged from hospital and all medications were reviewed during today's call.  Russell [name] verbalizes a good general understanding of the purpose, dosing, and scheduling of patient's medications.  Home Health [facility]) services: Caregiver Russell [name] reports [facility] PT through [place] has been out to visit patient already this week, and states sessions going well; working on balancing, strength, and ambulation.  Caregiver reports no recent falls, stated patient has fallen "once this year, 5 months ago," without reported injury.  Caregiver states patient occasionally uses a cane and walker, but can get around his home well without use of assistive devices.  Russell [name] stated that she declined [facility] RN services, stating that patient's insurance would not  pay for nurse.  Caregiver states that she lives close to patient and that patient has a Psychologist, sport and exercise through social services who assists regularly in his care as well.  Community resource needs:  Currently denied by caregiver Russell [name]; Russell [name] stated that she has tried in the past to get Meals on Wheels services for patient, stating he did not qualify for services at that time.  Caregiver reports she may look into this again after "things calm down" after patient's discharge home.   Provider appointments:  Caregiver Russell [name] and accurate reporting of upcoming patient provider appointments, and states that she will transport patient to these appointments.  Hospital follow up appointment with PCP scheduled for "next week."  Self-health management of chronic disease state of CHF:  Caregiver Russell [name] currently endorsing fluid restriction to 1.5 L/day, which she reports patient is compliant with.  Russell [name] states that she is currently unable to weigh patient daily, as she herself can not see the scale readings due to her cataracts. Caregiver verbalizes plan to have patient's personal care assistant to monitor and record weights on the days she works with patient.  CHF zones and corresponding action plan was discussed with caregiver, along with reasons she should contact patient's medical providers/ or EMS.    Caregiver/ daughter Russell [name] denies further questions, concerns, issues or problems today.  [place] services were discussed with caregiver, and she provided verbal consent for services during call.  I provided caregiver with the [facility] 24-h nurse advice line number, the main [facility] CM phone number, and my direct phone number.  I let caregiver know that another [facility] RN CM would be calling next week, and I would resume contact the following week.  Today we were able to  schedule [facility] CM initial home visit for later this month.   Plan:  Russell [name] will take his medications as they are  prescribed and will attend all scheduled provider appointments.  Russell [name] will monitor and record his weights on the days his personal assistant works with him, and his caregiver will monitor for signs/ symptoms of fluid overload.  Russell [name] will adhere to fluid restriction of 1.5 L/day.  Russell [name] caregiver/ family will notify providers promptly for any concerns, issues, or problems that arise.  Russell [name] outreach for transition of care to continue with phone call next week and in-home visit scheduled for later this month.  Oneta Rack, RN, BSN, [facility] Care Management  [phone]

## 2016-01-07 ENCOUNTER — Other Ambulatory Visit: Payer: Self-pay | Admitting: Internal Medicine

## 2016-01-09 ENCOUNTER — Telehealth: Payer: Self-pay

## 2016-01-09 NOTE — Telephone Encounter (Signed)
Paperwork signed, faxed, copy sent to scan 

## 2016-01-09 NOTE — Telephone Encounter (Signed)
Home Health Cert/Plan of Care received (11/25/2015 - 01/23/2016) and placed on MD's desk for signature

## 2016-01-09 NOTE — Telephone Encounter (Signed)
Called pt daughter completed TCM call below..../lmb  Transition Care Management Follow-up Telephone Call  Date discharged? 01/04/16   How have you been since you were released from the hospital? Daughter states dad is doing alright   Do you understand why you were in the hospital? YES   Do you understand the discharge instructions? YES   Where were you discharged to? Assisted Living Facility   Items Reviewed:  Medications reviewed: YES  Allergies reviewed: YES  Dietary changes reviewed: NO  Referrals reviewed: no referral needed   Functional Questionnaire:   Activities of Daily Living (ADLs):   She states he are independent in the following: ambulation, bathing and hygiene, feeding, continence, grooming, toileting and dressing States they require assistance with the following: ambulation sometimes   Any transportation issues/concerns?: NO   Any patient concerns? NO   Confirmed importance and date/time of follow-up visits scheduled YES, appt was made by hospital ist for 9/14 which they where aware.   Provider Appointment booked with Dr. Ronnald Ramp  Confirmed with patient if condition begins to worsen call PCP or go to the ER.  Patient was given the office number and encouraged to call back with question or concerns.  : YES

## 2016-01-10 ENCOUNTER — Other Ambulatory Visit: Payer: Self-pay | Admitting: *Deleted

## 2016-01-10 MED ORDER — ONETOUCH DELICA LANCETS 33G MISC: 11 refills | Status: DC

## 2016-01-11 ENCOUNTER — Other Ambulatory Visit: Payer: Self-pay

## 2016-01-11 ENCOUNTER — Ambulatory Visit (INDEPENDENT_AMBULATORY_CARE_PROVIDER_SITE_OTHER): Payer: PPO | Admitting: Neurology

## 2016-01-11 ENCOUNTER — Encounter: Payer: Self-pay | Admitting: Neurology

## 2016-01-11 VITALS — BP 118/60 | HR 71 | Temp 97.7°F | Ht 67.0 in | Wt 146.2 lb

## 2016-01-11 DIAGNOSIS — F03B18 Unspecified dementia, moderate, with other behavioral disturbance: Secondary | ICD-10-CM

## 2016-01-11 DIAGNOSIS — E119 Type 2 diabetes mellitus without complications: Secondary | ICD-10-CM

## 2016-01-11 DIAGNOSIS — R531 Weakness: Secondary | ICD-10-CM

## 2016-01-11 DIAGNOSIS — F0391 Unspecified dementia with behavioral disturbance: Secondary | ICD-10-CM | POA: Diagnosis not present

## 2016-01-11 DIAGNOSIS — F0390 Unspecified dementia without behavioral disturbance: Secondary | ICD-10-CM

## 2016-01-11 DIAGNOSIS — N390 Urinary tract infection, site not specified: Secondary | ICD-10-CM

## 2016-01-11 MED ORDER — DIVALPROEX SODIUM ER 250 MG PO TB24: 250.0000 mg | ORAL_TABLET | Freq: Every day | ORAL | 11 refills | Status: DC

## 2016-01-11 NOTE — Patient Outreach (Signed)
[place] [facility]) Care Management  01/11/2016  Dontavius Ashley 30-Mar-1924 [id number]     Difficult Case Discussion 01/10/16  [name] DOB: 19-Mar-1924 PCP: Dr. Scarlette Calico  Heart: Current Status: Patient has been UTR by [facility] staff Risk Impact: PMH:  82yr old [place] male. PMH:  chronic systolic CHF with EF 0000000, mild dementia, CKD stage 4, depression, anxiety, colon cancer status post resection, and prostate cancer status post radiation therapy. Rehab/Short term Care: No recent SNF admits, two inpatient admits within last 30days. Last admission 12/31/15 to 01/04/16. Symptoms: presented to ED with dyspnea and orthopnea Medical Summary: He was recently admitted 12/16/15 with CHF, diureses and lost close to 10 lbs of fluid then d/c on 12/22/15. Presented to ER on 12/31/15 with CHF exacerbation.   Assessment Plan: Per notes rehab was recommended but patient tearful during discussion and does not want rehab. Patient d/c home on 01/04/16. He has an aide that comes in on Monday at 9 am for 2 hrs, then Tuesdays for for 3 hrs, and Thur /Friday for 3 hrs.  Brenda(Dtr) takes him to his MD appts. And his other daughter Joaquim Lai has a maid who fixes his meals. Dtr states she can't afford the co pay for a RN right now so they would just like to get the HHPT for now. MD ordered [facility] aide at discharge but patient and family declined. Lasix home dosage increased from 20mg  daily to 40mg , not on ACE or ARB due to renal issues, BiDil added. SNF Denials: N/A Resolution/Recommendations: Patient/dtr contacted by [facility] community nurse. Patient in agreement with [facility] services and currently in [facility] program. Follow Up From Plan: HV scheduled by community RN for further assessment of care needs.  Final Resolution: Pending        Enzo Montgomery, RN,BSN,CCM [place] Management Telephonic Care Management Coordinator Direct Phone: [phone] Toll Free: [phone] Fax: [phone]

## 2016-01-11 NOTE — Patient Outreach (Signed)
[place] [facility]) Care Management  01/11/2016  Janette Douros 10/23/1923 [id number]  Assessment: 82 year old with recent admission for heart failure exacerbation. RNCM called for transition of care call. No answer. HIPPA compliant message left.  Plan: await return call. Update assigned RNCM.  Covering RNCM: Thea Silversmith, RN, MSN, [place] Coordinator Cell: [phone]

## 2016-01-11 NOTE — Patient Instructions (Addendum)
1. Start Depakote ER 250mg : take 1 tablet at night 2. Continue 24/7 care 3. Follow-up in 6 months
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
## 2016-01-12 ENCOUNTER — Ambulatory Visit (INDEPENDENT_AMBULATORY_CARE_PROVIDER_SITE_OTHER)
Admission: RE | Admit: 2016-01-12 | Discharge: 2016-01-12 | Disposition: A | Discharge status: Home / Self Care | Payer: PPO | Source: Ambulatory Visit | Attending: Family | Admitting: Family

## 2016-01-12 ENCOUNTER — Ambulatory Visit (INDEPENDENT_AMBULATORY_CARE_PROVIDER_SITE_OTHER): Payer: PPO | Admitting: Family

## 2016-01-12 ENCOUNTER — Encounter: Payer: Self-pay | Admitting: Family

## 2016-01-12 ENCOUNTER — Other Ambulatory Visit (INDEPENDENT_AMBULATORY_CARE_PROVIDER_SITE_OTHER): Payer: PPO

## 2016-01-12 VITALS — BP 120/58 | HR 54 | Temp 97.7°F | Resp 14 | Ht 67.0 in | Wt 147.0 lb

## 2016-01-12 DIAGNOSIS — Z5189 Encounter for other specified aftercare: Secondary | ICD-10-CM | POA: Diagnosis not present

## 2016-01-12 DIAGNOSIS — I5023 Acute on chronic systolic (congestive) heart failure: Secondary | ICD-10-CM

## 2016-01-12 DIAGNOSIS — F418 Other specified anxiety disorders: Secondary | ICD-10-CM

## 2016-01-12 DIAGNOSIS — J189 Pneumonia, unspecified organism: Secondary | ICD-10-CM | POA: Diagnosis not present

## 2016-01-12 LAB — BASIC METABOLIC PANEL
BUN: 28 mg/dL — AB (ref 6–23)
CHLORIDE: 108 meq/L (ref 96–112)
CO2: 29 mEq/L (ref 19–32)
Calcium: 8.7 mg/dL (ref 8.4–10.5)
Creatinine, Ser: 1.37 mg/dL (ref 0.40–1.50)
GFR: 62.72 mL/min (ref >60.00)
Glucose, Bld: 72 mg/dL (ref 70–99)
POTASSIUM: 3.9 meq/L (ref 3.5–5.1)
SODIUM: 142 meq/L (ref 135–145)

## 2016-01-12 LAB — CBC
HCT: 39.3 % (ref 39.0–52.0)
Hemoglobin: 13.1 g/dL (ref 13.0–17.0)
MCHC: 33.3 g/dL (ref 30.0–36.0)
MCV: 87.5 fl (ref 78.0–100.0)
Platelets: 213 K/uL (ref 150.0–400.0)
RBC: 4.5 Mil/uL (ref 4.22–5.81)
RDW: 15 % (ref 11.5–15.5)
WBC: 4.2 K/uL (ref 4.0–10.5)

## 2016-01-12 NOTE — Progress Notes (Signed)
Subjective:    Patient ID: Russell [name], male    DOB: Nov 09, 1923, 82 y.o.   MRN: [id number]  Chief Complaint  Patient presents with  . Hospitalization Follow-up    HPI:  Russell [name] is a 82 y.o. male who  has a past medical history of Anxiety associated with depression; Colon cancer ([place]); Dementia; Diabetes mellitus; FTT (failure to thrive); History of cystoscopy; Hyperlipidemia; Hypertension; Loss of hearing; Osteoarthritis; Pneumonia; Prostate cancer ([place]); SBO (small bowel obstruction) ([place]) (03/08/2014); and Urolithiasis. and presents today for a hospitalization follow up.  Recently evaluated in the emergency department and admitted to the hospital with complaints of shortness of breath with symptoms worsen nighttime since he had been recently discharged from pneumonia with increased work of breathing and wheezing. There is no chest pain, abdominal pain, or other symptoms. Physical exam revealed tachypnea and mildly diminished sounds in the left with increased work of breathing. Chest x-ray showed cardiac enlargement with pulmonary vascular congestion, bilateral pleural effusions, and basilar atelectasis or infiltration. He was given Lasix and albuterol and noted to have improved symptoms. His troponin was slightly elevated which was related to demand ischemia. His be naturally peptide was 2335. He was diagnosed with acute on chronic combined systolic and diastolic heart failure with ejection fraction 2025%. He was diuresed with IV Lasix and switched oral Lasix and became asymptomatic. His home dose of Lasix was changed from 20 to 40 mg. Hospitalist recommended follow-up for basic metabolic profile, CBC, and 2 view chest x-ray. He was also sent home with home health physical therapy and nursing. All ED and hospital records, imaging, and labs were reviewed in detail.  Since leaving the hospital he has not had any further episodes of shortness of breath.  He has been crying more lately and  eating less. Seen by neurology and started on Depakote and has only taken one dose thus far. He continues to work with home health RN and PT. Previously referred to hospice with family requesting update. Family notes he generally sleeps during the day and is awake at night.   No Known Allergies    Outpatient Medications Prior to Visit  Medication Sig Dispense Refill  . ALPRAZolam (XANAX) 0.5 MG tablet TAKE 1 TABLET BY MOUTH THREE TIMES DAILY AS NEEDED FOR ANXIETY (Patient taking differently: TAKE 1 TABLET EVERY MORNING) 60 tablet 3  . aspirin 81 MG tablet Take 81 mg by mouth daily.      . Azelastine-Fluticasone (DYMISTA) 137-50 MCG/ACT SUSP Place 1 Act into the nose 2 (two) times daily. (Patient taking differently: Place 1 spray into both nostrils 2 (two) times daily as needed (for allergies). ) 1 Bottle 11  . carvedilol (COREG) 3.125 MG tablet Take 1 tablet (3.125 mg total) by mouth 2 (two) times daily with a meal. 60 tablet 1  . Cyanocobalamin (VITAMIN B-12 PO) Take 1 tablet by mouth daily with breakfast.    . divalproex (DEPAKOTE ER) 250 MG 24 hr tablet Take 1 tablet (250 mg total) by mouth daily. 30 tablet 11  . fesoterodine (TOVIAZ) 8 MG TB24 tablet Take 1 tablet (8 mg total) by mouth daily. 30 tablet 11  . furosemide (LASIX) 40 MG tablet Take 1 tablet (40 mg total) by mouth daily. 30 tablet 0  . HYDROcodone-acetaminophen (NORCO/VICODIN) 5-325 MG tablet Take 1 tablet by mouth every 6 (six) hours as needed for moderate pain. 90 tablet 0  . isosorbide-hydrALAZINE (BIDIL) 20-37.5 MG tablet Take 1 tablet by mouth 3 (three) times daily.  90 tablet 0  . ONETOUCH DELICA LANCETS 99991111 MISC USE TO TEST BLOOD SUGAR UP TO THREE TIMES DAILY Dx E11.9 100 each 11  . polyethylene glycol (MIRALAX / GLYCOLAX) packet Take 17 g by mouth daily. (Patient taking differently: Take 17 g by mouth daily. MIX AND DRINK) 14 each 11  . potassium chloride SA (K-DUR,KLOR-CON) 20 MEQ tablet Take 1 tablet (20 mEq total) by  mouth daily. 30 tablet 0  . promethazine-dextromethorphan (PROMETHAZINE-DM) 6.25-15 MG/5ML syrup Take 5 mLs by mouth 4 (four) times daily as needed for cough. 118 mL 2  . sertraline (ZOLOFT) 100 MG tablet TAKE 1 TABLET BY MOUTH DAILY (Patient taking differently: Take 100 mg by mouth once daily) 90 tablet 2  . simvastatin (ZOCOR) 80 MG tablet Take 40 mg by mouth daily at 6 PM.   3   No facility-administered medications prior to visit.       Past Surgical History:  Procedure Laterality Date  . CATARACT EXTRACTION, BILATERAL    . HEMICOLECTOMY  12/06  . TRANSURETHRAL RESECTION OF PROSTATE    . xrt     ERT for prostate ca      Past Medical History:  Diagnosis Date  . Anxiety associated with depression   . Colon cancer [facility])    s/p resection 12/06  . Dementia   . Diabetes mellitus    diet mgt (11`/12)  . FTT (failure to thrive)    admited 08/2008  . History of cystoscopy    uretheral stricutre, 2005- Dr.Humphreys  . Hyperlipidemia   . Hypertension   . Loss of hearing    being fitted for hearing aids (11/12)  . Osteoarthritis    hands  . Pneumonia   . Prostate cancer ([place])    s/p XRT  . SBO (small bowel obstruction) ([place]) 03/08/2014  . Urolithiasis       Review of Systems  Unable to perform ROS: Dementia      Objective:    BP (!) 120/58 (BP Location: Left Arm, Patient Position: Sitting, Cuff Size: Normal)   Pulse (!) 54   Temp 97.7 F (36.5 C) (Oral)   Resp 14   Ht 5\' 7"  (1.702 m)   Wt 147 lb (66.7 kg)   SpO2 97%   BMI 23.02 kg/m  Nursing note and vital signs reviewed.  Physical Exam  Constitutional: He is oriented to person, place, and time. He appears well-developed and well-nourished. He appears lethargic. He is cooperative. He is easily aroused.  Non-toxic appearance. He does not have a sickly appearance. He does not appear ill. No distress.  Cardiovascular: Normal rate, regular rhythm, normal heart sounds and intact distal pulses.   Pulmonary/Chest:  Effort normal and breath sounds normal. No respiratory distress. He has no wheezes. He has no rales. He exhibits no tenderness.  Neurological: He is oriented to person, place, and time and easily aroused. He appears lethargic.  Skin: Skin is warm and dry.  Psychiatric: He has a normal mood and affect. His behavior is normal. Judgment and thought content normal.  Tearful at times.       Assessment & Plan:   Problem List Items Addressed This Visit      Cardiovascular and Mediastinum   Acute on chronic systolic (congestive) heart failure (HCC) - Primary    Recently admitted to the hospital for acute exacerbation of acute on chronic systolic heart failure with significant improvement in symptoms and no continued shortness of breath or weight gain. Continues to take  his Lasix as prescribed. Obtain chest x-ray, basic metabolic panel, and CBC. Continue current dosage of isosorbide-hydralazine, furosemide, and carvedilol. Reduce sedating medications as much as possible.      Relevant Orders   CBC (Completed)   Basic Metabolic Panel (BMET) (Completed)   DG Chest 2 View (Completed)     Other   Depression with anxiety    Other Visit Diagnoses   None.      I am having Mr. Coday maintain his aspirin, fesoterodine, sertraline, simvastatin, ALPRAZolam, Azelastine-Fluticasone, polyethylene glycol, Cyanocobalamin (VITAMIN B-12 PO), carvedilol, promethazine-dextromethorphan, HYDROcodone-acetaminophen, furosemide, potassium chloride SA, isosorbide-hydrALAZINE, ONETOUCH DELICA LANCETS 99991111, and divalproex.   Follow-up: Return in about 1 month (around 02/11/2016), or if symptoms worsen or fail to improve.  Mauricio Po, FNP

## 2016-01-12 NOTE — Patient Instructions (Addendum)
Thank you for choosing [facility].  SUMMARY AND INSTRUCTIONS:  Please continue to monitor weight at home.   Eat as tolerated.   We will check on the referral to hospice.  Medication:  Your prescription(s) have been submitted to your pharmacy or been printed and provided for you. Please take as directed and contact our office if you believe you are having problem(s) with the medication(s) or have any questions.  Labs:  Please stop by the lab on the lower level of the building for your blood work. Your results will be released to [place] (or called to you) after review, usually within 72 hours after test completion. If any changes need to be made, you will be notified at that same time.  1.) The lab is open from 7:30am to 5:30 pm Monday-Friday 2.) No appointment is necessary 3.) Fasting (if needed) is 6-8 hours after food and drink; black coffee and water are okay   Imaging / Radiology:  Please stop by radiology on the basement level of the building for your x-rays. Your results will be released to [place] (or called to you) after review, usually within 72 hours after test completion. If any treatments or changes are necessary, you will be notified at that same time.  Follow up:  If your symptoms worsen or fail to improve, please contact our office for further instruction, or in case of emergency go directly to the emergency room at the closest medical facility.

## 2016-01-12 NOTE — Assessment & Plan Note (Signed)
Recently admitted to the hospital for acute exacerbation of acute on chronic systolic heart failure with significant improvement in symptoms and no continued shortness of breath or weight gain. Continues to take his Lasix as prescribed. Obtain chest x-ray, basic metabolic panel, and CBC. Continue current dosage of isosorbide-hydralazine, furosemide, and carvedilol. Reduce sedating medications as much as possible.

## 2016-01-18 ENCOUNTER — Telehealth: Payer: Self-pay | Admitting: Emergency Medicine

## 2016-01-18 ENCOUNTER — Telehealth: Payer: Self-pay | Admitting: Internal Medicine

## 2016-01-18 DIAGNOSIS — Z87891 Personal history of nicotine dependence: Secondary | ICD-10-CM | POA: Diagnosis not present

## 2016-01-18 DIAGNOSIS — F0390 Unspecified dementia without behavioral disturbance: Secondary | ICD-10-CM | POA: Diagnosis not present

## 2016-01-18 DIAGNOSIS — Z8546 Personal history of malignant neoplasm of prostate: Secondary | ICD-10-CM | POA: Diagnosis not present

## 2016-01-18 DIAGNOSIS — Z7982 Long term (current) use of aspirin: Secondary | ICD-10-CM | POA: Diagnosis not present

## 2016-01-18 DIAGNOSIS — Z85038 Personal history of other malignant neoplasm of large intestine: Secondary | ICD-10-CM | POA: Diagnosis not present

## 2016-01-18 DIAGNOSIS — I5023 Acute on chronic systolic (congestive) heart failure: Secondary | ICD-10-CM | POA: Diagnosis not present

## 2016-01-18 DIAGNOSIS — N183 Chronic kidney disease, stage 3 (moderate): Secondary | ICD-10-CM | POA: Diagnosis not present

## 2016-01-18 DIAGNOSIS — R300 Dysuria: Secondary | ICD-10-CM

## 2016-01-18 DIAGNOSIS — J189 Pneumonia, unspecified organism: Secondary | ICD-10-CM | POA: Diagnosis not present

## 2016-01-18 DIAGNOSIS — N390 Urinary tract infection, site not specified: Secondary | ICD-10-CM | POA: Diagnosis not present

## 2016-01-18 DIAGNOSIS — F419 Anxiety disorder, unspecified: Secondary | ICD-10-CM | POA: Diagnosis not present

## 2016-01-18 DIAGNOSIS — I130 Hypertensive heart and chronic kidney disease with heart failure and stage 1 through stage 4 chronic kidney disease, or unspecified chronic kidney disease: Secondary | ICD-10-CM | POA: Diagnosis not present

## 2016-01-18 DIAGNOSIS — F329 Major depressive disorder, single episode, unspecified: Secondary | ICD-10-CM | POA: Diagnosis not present

## 2016-01-18 NOTE — Telephone Encounter (Signed)
Pts daughter called and stated her dads urine is cloudy, and has a white discharge in it. She said the physical therapist said he was also going to call. She wants to know what to do. She states her dad might need hospice and she is concerned about what to do. Please advise thanks.

## 2016-01-18 NOTE — Telephone Encounter (Signed)
Seen patient today.  Patient seems lethargic.  Urine is cloudy.  Possible protein in urine.  Concerned patient has UTI again.  Spoke with daughter about hospice referral.  Daughter states Dr. Ronnald Ramp had spoke about this as well.  States Daughter is interested in this and would like this to be pushed through.  States vital signs are normal today and patient did not have a fever.  Requesting pending potential hospice referral for extending home health orders for 1 time a week for two weeks.

## 2016-01-19 ENCOUNTER — Other Ambulatory Visit: Payer: Self-pay

## 2016-01-19 ENCOUNTER — Other Ambulatory Visit: Payer: Self-pay | Admitting: *Deleted

## 2016-01-19 DIAGNOSIS — R300 Dysuria: Secondary | ICD-10-CM | POA: Diagnosis not present

## 2016-01-19 MED ORDER — ONETOUCH DELICA LANCETS 33G MISC: 2 refills | Status: DC

## 2016-01-19 NOTE — Telephone Encounter (Signed)
Sending request for okay to have a urin specimen brought in for culture in PCP absence?   Lowella Fairy, he stated is not with pt today. Shanon Brow stated that the pt is showing signs of significant decline. Shanon Brow did request for verbal for 2 more visits. Verbal okay given as a back up to [place] hospice referral was entered. Contact Hospice and gave verbal for Hospice eval. They will review Epic for order and dx for referral. Dr. Ronnald Ramp is the attending physician.   LVM for pt dts (BRENDA) to call back as soon as possible.  RE: is she able to bring in a urine specimen (sterile).

## 2016-01-19 NOTE — Telephone Encounter (Signed)
Russell [name] called back and stated that they would be able to bring a sterile specimen to the lab. Russell [name] spouse will pick up a cup from the office today.

## 2016-01-19 NOTE — Telephone Encounter (Signed)
Verbal okay from Terri Piedra, FNP to order Urine Culture.   Cup placed up front.

## 2016-01-19 NOTE — Patient Outreach (Signed)
[name]) Care Management [place] Telephone Outreach, Transition of Care day 14 01/19/2016  Russell [name] 11/27/1923 [id number]   Successful telephone outreach to Russell [name], daughter/caregiver, on [name] written consent, for patient Russell [name], 82 y/o male referred to [place] for transition of care after recent hospitalization September 2-6, 2017 for dyspnea, cough, orthopnea, and CHF exacerbation.  Patient was diuresed during hospital visit and discharged home with [facility]) services for RN and PT.  Patient has history of HTN, CHF with EF of 20-25%, mild dementia and CKD.  Patient has had two recent hospitalizations for CHF exacerbation.  HIPPA/ identity verified through [facility].    Today, Russell [name] reports that patient is doing "pretty good," and she confirms that Russell [name] services have continued and "are going well."  Russell [name] reports that patient's personal care assistant has continued to be involved in his care as well, and verbalizes that patient's assistant has been monitoring and recording patient's weights on the days she is present.  Russell [name] confirms that patient attended his PCP appointment on January 11, 2016, and that "nothing was changed" during the appointment.  Russell [name] also confirms that patient's family has continued monitoring his fluid intake, which she states has been "going well."  Russell [name] states that she believes patient is "sleeping too much."  Russell [name] states that she has discussed this with patient's PCP.  Russell [name] states, "he has good days and bad days, but overall things are about the same."  Russell [name] denies further questions, concerns, problems or issues today, and we confirmed our previously scheduled in-home visit for next week.   Plan:  Russell [name] will take his medications as they are prescribed and will attend all scheduled provider appointments.  Russell [name] will monitor and record his weights on the days his personal assistant  works with him, and his caregiver will monitor for signs/ symptoms of fluid overload.  Russell [name] will adhere to fluid restriction of 1.5 L/day.  Russell [name] caregiver/ family will notify providers promptly for any concerns, issues, or problems that arise.  [place] outreach for transition of care to continue with in-home visit scheduled for next week.   Oneta Rack, RN, BSN, [employer] [facility] Care Management  [phone]

## 2016-01-22 LAB — CULTURE, URINE COMPREHENSIVE

## 2016-01-24 ENCOUNTER — Telehealth: Payer: Self-pay | Admitting: Emergency Medicine

## 2016-01-24 MED ORDER — FUROSEMIDE 40 MG PO TABS: 40.0000 mg | ORAL_TABLET | Freq: Every day | ORAL | 1 refills | Status: DC

## 2016-01-24 MED ORDER — CARVEDILOL 3.125 MG PO TABS: 3.1250 mg | ORAL_TABLET | Freq: Two times a day (BID) | ORAL | 1 refills | Status: DC

## 2016-01-24 NOTE — Telephone Encounter (Signed)
Pt dtr informed that no meds have been changed.  erx sent.

## 2016-01-24 NOTE — Telephone Encounter (Signed)
Pts daughter called and wants to know if he still needs to take carvedilol (COREG) 3.125 MG tablet. If so he is currently out of this medication and needs a refill. Pt also needs a refill on furosemide (LASIX) 40 MG tablet. Pharmacy is [facility]. Please advise thanks.

## 2016-01-26 ENCOUNTER — Encounter: Payer: Self-pay | Admitting: *Deleted

## 2016-01-26 ENCOUNTER — Other Ambulatory Visit: Payer: Self-pay | Admitting: *Deleted

## 2016-01-26 NOTE — Patient Outreach (Signed)
[place] [facility]) Care Management  [place] Initial Home Visit, Transition of Care day 21 01/26/2016  Russell [name] 1923-12-09 [id number]  Russell [name] is an 82 y.o. male  referred to [place] for transition of care after recent hospitalization September 2-6, 2017 for dyspnea, cough, orthopnea, and CHF exacerbation.  Patient was diuresed during hospital visit and discharged home with [facility]) services for PT.  Patient has history of HTN, CHF with EF of 20-25%, mild dementia and CKD.  Patient has had two recent hospitalizations for CHF exacerbation.  HIPAA/ identity verified with patient today, and patient's daughters Russell [name] and Russell [name] are present during visit.    Today, [name] reports that he thinks he is doing "pretty good."  [name] states that he gets "tired a lot," which he attributes to his age.  Patient's family confirm that [facility] PT services have continued, and family reports that patient "doesn't do much when it's just [place] here."  Family verbalizes that they would like for patient to get out of bed on his own and be more active on a daily basis.  Patient/ family deny new falls, and patient uses a cane when ambulating in his home; ambulation is steady today, but patient reports that he "gets off-balance sometimes."  I encouraged patient to continue working with [facility] PT, and to get up and about in his home when he feels able to do so safely.  No obvious fall risks identified in patient's home today; general education for fall prevention discussed with patient and family.  [name] reports that patient's personal care assistant has continued to be involved in his care as well, and verbalizes that patient's assistant has been monitoring and recording patient's weights on the days she is present.  We reviewed patients recorded weights since his discharge home, and weight range has been 140-153 lbs, with a weight today of 140 lbs. Family members also report that  they have continued monitoring patient's fluid restrictions, currently at 1500 cc/day, by filling a water pitcher which they keep in the refrigerator for patient use only.  Education was provided/ reinforced for self-health management of chronic disease state of CHF, and CHF packet was provided to patient's family members and discussed thoroughly. CHF zones/ action plan and reasons that would necessitate calling 911 were discussed with all.  Family/ care assistant have also been monitoring and recording patient's fasting morning blood sugars, which have ranged from 69-112 since hospital discharge.  BP's have been monitored and recorded as well, with a general range of 106-138/47-60 since hospital discharge.  Positive reinforcement was provided today that patient/ family have been monitoring/ recording all values, with encouragement provided to continue this practice.  Family reports that they administer patient's medications each day using a pill box, and medication reconciliation was performed in patient's home today.  Patient was recently discharged from hospital and all medications were thoroughly reviewed with patient and his family members.  Family members continue to deny community resource needs, as social services are currently involved in patient's care, and family reports patient has an active Optician, dispensing through social services.  Family reports that they have an upcoming appointment "soon" with patient's social services case worker.      Russell [name], and patient deny further questions, concerns, problems or issues today.  Subjective: "I am doing pretty good, I think, considering my age."  Objective:    BP 122/60   Pulse 66   Resp 14  Wt 140 lb (63.5 kg)   SpO2 97%   BMI 21.93 kg/m    Review of Systems  Constitutional: Positive for malaise/fatigue. Negative for fever.  Respiratory: Negative.  Negative for cough, sputum production, shortness of breath and wheezing.    Cardiovascular: Negative.  Negative for chest pain and leg swelling.  Gastrointestinal: Negative.  Negative for abdominal pain and nausea.  Genitourinary: Negative.   Musculoskeletal: Negative for falls and myalgias.  Skin: Positive for itching.  Neurological: Negative.   Psychiatric/Behavioral: Negative for depression. The patient is not nervous/anxious.     Physical Exam  Constitutional: He is oriented to person, place, and time. He appears well-developed and well-nourished. No distress.  Cardiovascular: Normal rate, regular rhythm and intact distal pulses.   Pulses:      Radial pulses are 2+ on the right side, and 2+ on the left side.  Heart sounds distant  Respiratory: Effort normal and breath sounds normal. No respiratory distress. He has no wheezes. He has no rales.  GI: Soft. Bowel sounds are normal.  Musculoskeletal: He exhibits no edema.  Neurological: He is alert and oriented to person, place, and time.  Skin: Skin is warm and dry. No rash noted. He is not diaphoretic. No erythema.  Psychiatric: He has a normal mood and affect. His behavior is normal.    Encounter Medications:   Outpatient Encounter Prescriptions as of 01/26/2016  Medication Sig  . ALPRAZolam (XANAX) 0.5 MG tablet TAKE 1 TABLET BY MOUTH THREE TIMES DAILY AS NEEDED FOR ANXIETY (Patient taking differently: TAKE 1 TABLET EVERY MORNING)  . aspirin 81 MG tablet Take 81 mg by mouth daily.    . Azelastine-Fluticasone (DYMISTA) 137-50 MCG/ACT SUSP Place 1 Act into the nose 2 (two) times daily. (Patient taking differently: Place 1 spray into both nostrils 2 (two) times daily as needed (for allergies). )  . carvedilol (COREG) 3.125 MG tablet Take 1 tablet (3.125 mg total) by mouth 2 (two) times daily with a meal.  . Cyanocobalamin (VITAMIN B-12 PO) Take 1 tablet by mouth daily with breakfast.  . divalproex (DEPAKOTE ER) 250 MG 24 hr tablet Take 1 tablet (250 mg total) by mouth daily.  . fesoterodine (TOVIAZ) 8 MG  TB24 tablet Take 1 tablet (8 mg total) by mouth daily.  . furosemide (LASIX) 40 MG tablet Take 1 tablet (40 mg total) by mouth daily.  Marland Kitchen HYDROcodone-acetaminophen (NORCO/VICODIN) 5-325 MG tablet Take 1 tablet by mouth every 6 (six) hours as needed for moderate pain.  . isosorbide-hydrALAZINE (BIDIL) 20-37.5 MG tablet Take 1 tablet by mouth 3 (three) times daily.  Glory Rosebush DELICA LANCETS [id number] MISC Use to test blood sugar 1-2 times per day. Dx E11.9  . polyethylene glycol (MIRALAX / GLYCOLAX) packet Take 17 g by mouth daily. (Patient taking differently: Take 17 g by mouth daily. [place])  . potassium chloride SA (K-DUR,KLOR-CON) 20 MEQ tablet Take 1 tablet (20 mEq total) by mouth daily.  . promethazine-dextromethorphan (PROMETHAZINE-DM) 6.25-15 MG/5ML syrup Take 5 mLs by mouth 4 (four) times daily as needed for cough.  . sertraline (ZOLOFT) 100 MG tablet TAKE 1 TABLET BY MOUTH DAILY (Patient taking differently: Take 100 mg by mouth once daily)  . simvastatin (ZOCOR) 80 MG tablet Take 40 mg by mouth daily at 6 PM.    No facility-administered encounter medications on file as of 01/26/2016.     Functional Status:   In your present state of health, do you have any difficulty performing the following  activities: 01/06/2016 01/01/2016  Hearing? Tempie Donning  Vision? N N  Difficulty concentrating or making decisions? N N  Walking or climbing stairs? Y Y  Dressing or bathing? Y Y  Doing errands, shopping? Tempie Donning  Preparing Food and eating ? Y -  Using the Toilet? Y -  In the past six months, have you accidently leaked urine? Y -  Do you have problems with loss of bowel control? N -  Managing your Medications? N -  Managing your Finances? Y -  Housekeeping or managing your Housekeeping? Y -  Some recent data might be hidden    Fall/Depression Screening:    PHQ 2/9 Scores 01/06/2016 01/27/2015  PHQ - 2 Score 1 4  PHQ- 9 Score - 9    Assessment:  Mr. Schrandt continues to feel better after his recent  hospitalization, but his family caregivers are concerned that he is not getting enough activity.  [place] services for PT are currently active, and patient has a Optician, dispensing through social services.  Mr. Marine has a very supportive and involved family support system which is actively involved in his day-to-day care.  Mr. Aftab family/ personal care assistant have continued monitoring and recording his daily weights and fluid restriction guidelines.  Education was provided and discussed with patient and his family today re: self-health management of chronic disease state of CHF and fall prevention.   Plan:   Mr. Harkcom will take his medications as they are prescribed and will attend all scheduled provider appointments.   Mr. Ess will monitor and record his weights on the days his personal assistant works with him, and his caregiver will monitor for signs/ symptoms of fluid overload.   Mr. Dacosta will adhere to fluid restriction of 1.5 L/day.   Mr. Hafeman caregiver/ family will notify providers promptly for any concerns, issues, or problems that arise.   [place] outreach for transition of care to continue with scheduled phone call  next week.  I appreciate the opportunity to participate in Mr. Wessman care,   Oneta Rack, RN, BSN, [place] Coordinator [facility] Care Management  [phone]

## 2016-01-27 DIAGNOSIS — N390 Urinary tract infection, site not specified: Secondary | ICD-10-CM | POA: Diagnosis not present

## 2016-01-27 DIAGNOSIS — J189 Pneumonia, unspecified organism: Secondary | ICD-10-CM | POA: Diagnosis not present

## 2016-01-27 DIAGNOSIS — I5023 Acute on chronic systolic (congestive) heart failure: Secondary | ICD-10-CM | POA: Diagnosis not present

## 2016-01-27 DIAGNOSIS — Z7982 Long term (current) use of aspirin: Secondary | ICD-10-CM | POA: Diagnosis not present

## 2016-01-27 DIAGNOSIS — Z85038 Personal history of other malignant neoplasm of large intestine: Secondary | ICD-10-CM | POA: Diagnosis not present

## 2016-01-27 DIAGNOSIS — Z8546 Personal history of malignant neoplasm of prostate: Secondary | ICD-10-CM | POA: Diagnosis not present

## 2016-01-27 DIAGNOSIS — I130 Hypertensive heart and chronic kidney disease with heart failure and stage 1 through stage 4 chronic kidney disease, or unspecified chronic kidney disease: Secondary | ICD-10-CM | POA: Diagnosis not present

## 2016-01-30 ENCOUNTER — Encounter: Payer: Self-pay | Admitting: Physician Assistant

## 2016-01-30 ENCOUNTER — Ambulatory Visit (INDEPENDENT_AMBULATORY_CARE_PROVIDER_SITE_OTHER): Payer: PPO | Admitting: Physician Assistant

## 2016-01-30 VITALS — BP 122/50 | HR 70 | Ht 67.0 in | Wt 139.8 lb

## 2016-01-30 DIAGNOSIS — I5022 Chronic systolic (congestive) heart failure: Secondary | ICD-10-CM | POA: Diagnosis not present

## 2016-01-30 DIAGNOSIS — N183 Chronic kidney disease, stage 3 unspecified: Secondary | ICD-10-CM

## 2016-01-30 DIAGNOSIS — E785 Hyperlipidemia, unspecified: Secondary | ICD-10-CM

## 2016-01-30 DIAGNOSIS — I493 Ventricular premature depolarization: Secondary | ICD-10-CM

## 2016-01-30 DIAGNOSIS — I10 Essential (primary) hypertension: Secondary | ICD-10-CM

## 2016-01-30 NOTE — Patient Instructions (Addendum)
Medication Instructions:  Your physician recommends that you continue on your current medications as directed. Please refer to the Current Medication list given to you today.  Labwork: TODAY BMET  Testing/Procedures: NONE  Follow-Up: 05/08/16 WITH DR. Caryl Comes  Any Other Special Instructions Will Be Listed Below (If Applicable).  If you need a refill on your cardiac medications before your next appointment, please call your pharmacy.

## 2016-01-30 NOTE — Progress Notes (Signed)
Cardiology Office Note:    Date:  01/30/2016   ID:  Russell [name], DOB 12-19-1923, MRN [id number]  PCP:  Scarlette Calico, MD  Cardiologist:  Dr. Virl Axe   Neurologist: Dr. Delice Lesch  Referring MD: Janith Lima, MD   Chief Complaint  Patient presents with  . Hospitalization Follow-up    CHF    History of Present Illness:    Russell [name] is a 82 y.o. male with a hx of PVCs with prior Holter monitoring indicating burden of PVCs 10-20% tx medically, HTN, HL, DM, dementia, prostate CA, colon CA, orthostatic hypotension.  .  In 9/15, he was evaluated for dyspnea on exertion and Echo was noted to show worsening LVEF (previously 50-55% >> now 42%).  The patient declined aggressive evaluation at that time and med Rx was continued.  He was admitted in 3/17 with syncope 2/2 dehydration in the setting of UTI. Last seen in clinic by Dr. Virl Axe in 5/17.  Admitted 12/16/15-12/22/15 with a/c combined systolic and diastolic CHF c/b Pseudomonas UTI and AKI.  Echo demonstrated his EF was worse at 20-25%. He had minimally elevated Troponin levels without clear trend.  This was not diagnostic of ACS and he was not felt to be a candidate for aggressive, cardiac evaluation. Med Rx was planned. He was followed by cardiology.  ACE was held until renal function could improve (as an outpt).  It was felt he should be allowed to have a higher blood pressure given hx of orthostatic BP drop.  Readmitted 12/31/15-01/04/16 with a/c combined systolic and diastolic CHF.  DC weight 146 lbs.  He did have CHF Vest assessment prior to DC. The reading was 22, suggesting euvolemic status.  BiDil was added to his regimen.  He was kept off of ACE inhibitor.  Returns for FU.  Here with his daughter today.  She helps with the hx.  The patient denies and worsening dyspnea on exertion.  He denies chest pain.  He denies orthopnea, PND, edema.  He denies syncope.  His appetite is not that good. But, he tries to drink Ensure twice a day.       Prior CV studies that were reviewed today include:    Limited Echo with Definity 12/22/15 Diff HK, inf and post AK, apical AK, mild LVH, EF 20-25%, mild AI, mild MR, mild LAE, mod to severe reduced RVSF, small pericardial effusion  Echo 12/18/15 EF 20%, diff HK, ant-septal, ant-lateral inf severe HK, prominent trabeculation noted in LV apex, mod AI, trivial MR, trivial TR, small pericardial effusion   Past Medical History:  Diagnosis Date  . Anxiety associated with depression   . Chronic systolic CHF (congestive heart failure) ([place])    a. Echo 8/17: EF 20%, diff HK, ant-septal, ant-lateral inf severe HK, prominent trabeculation noted in LV apex, mod AI, trivial MR, trivial TR, small pericardial effusion // b. Limited Echo 8/17: Diff HK, inf and post AK, apical AK, mild LVH, EF 20-25%, mild AI, mild MR, mild LAE, mod to severe reduced RVSF, small pericardial effusion  . Colon cancer [facility])    s/p resection 12/06  . Dementia   . Diabetes mellitus    diet mgt (11`/12)  . FTT (failure to thrive)    admited 08/2008  . History of cystoscopy    uretheral stricutre, 2005- Dr.Humphreys  . Hyperlipidemia   . Hypertension   . Loss of hearing    being fitted for hearing aids (11/12)  . Osteoarthritis    hands  .  Pneumonia   . Prostate cancer ([place])    s/p XRT  . SBO (small bowel obstruction) 03/08/2014  . Urolithiasis     Past Surgical History:  Procedure Laterality Date  . CATARACT EXTRACTION, BILATERAL    . HEMICOLECTOMY  12/06  . TRANSURETHRAL RESECTION OF PROSTATE    . xrt     ERT for prostate ca    Current Medications: Current Meds  Medication Sig  . ALPRAZolam (XANAX) 0.5 MG tablet Take 0.5 mg by mouth 3 (three) times daily as needed for anxiety.  Marland Kitchen aspirin 81 MG tablet Take 81 mg by mouth daily.    . Azelastine-Fluticasone 137-50 MCG/ACT SUSP Place 1 spray into the nose 2 (two) times daily as needed.  . carvedilol (COREG) 3.125 MG tablet Take 1 tablet (3.125 mg total)  by mouth 2 (two) times daily with a meal.  . Cyanocobalamin (VITAMIN B-12 PO) Take 1 tablet by mouth daily with breakfast.  . divalproex (DEPAKOTE ER) 250 MG 24 hr tablet Take 1 tablet (250 mg total) by mouth daily.  . fesoterodine (TOVIAZ) 8 MG TB24 tablet Take 1 tablet (8 mg total) by mouth daily.  . furosemide (LASIX) 40 MG tablet Take 1 tablet (40 mg total) by mouth daily.  Marland Kitchen HYDROcodone-acetaminophen (NORCO/VICODIN) 5-325 MG tablet Take 1 tablet by mouth every 6 (six) hours as needed for moderate pain.  . isosorbide-hydrALAZINE (BIDIL) 20-37.5 MG tablet Take 1 tablet by mouth 3 (three) times daily.  . ONE TOUCH ULTRA TEST test strip   . ONETOUCH DELICA LANCETS 99991111 MISC Use to test blood sugar 1-2 times per day. Dx E11.9  . polyethylene glycol (MIRALAX / GLYCOLAX) packet Take 17 g by mouth daily.  . potassium chloride SA (K-DUR,KLOR-CON) 20 MEQ tablet Take 1 tablet (20 mEq total) by mouth daily.  . promethazine-dextromethorphan (PROMETHAZINE-DM) 6.25-15 MG/5ML syrup Take 5 mLs by mouth 4 (four) times daily as needed for cough.  . sertraline (ZOLOFT) 100 MG tablet Take 100 mg by mouth daily.  . simvastatin (ZOCOR) 80 MG tablet Take 40 mg by mouth daily at 6 PM.   . [DISCONTINUED] Azelastine-Fluticasone (DYMISTA) 137-50 MCG/ACT SUSP Place 1 Act into the nose 2 (two) times daily. (Patient taking differently: Place 1 spray into both nostrils 2 (two) times daily as needed (for allergies). )     Allergies:   Review of patient's allergies indicates no known allergies.   Social History   Social History  . Marital status: Widowed    Spouse name: N/A  . Number of children: N/A  . Years of education: N/A   Social History Main Topics  . Smoking status: Former Smoker    Quit date: 03/20/1967  . Smokeless tobacco: Never Used  . Alcohol use No     Comment: quit '68  . Drug use: No  . Sexual activity: Not Currently   Other Topics Concern  . None   Social History Narrative   7th grade  education. Married '51 - 9/09. 3 daughters- '51, '53, '66; 1 son- '54. Lives w/ daughter Dub Mikes who has schizophrenia since age 44. 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   ROS:   Please see the history of present illness.    Review of Systems  Constitution: Positive for chills, decreased appetite, malaise/fatigue and weight loss.  Cardiovascular: Positive for irregular heartbeat and leg  swelling.  Respiratory: Positive for cough, shortness of breath and snoring.   Hematologic/Lymphatic: Bruises/bleeds easily.  Skin: Positive for rash.  Musculoskeletal: Positive for back pain, joint pain and myalgias.  Gastrointestinal: Positive for abdominal pain, constipation and diarrhea.  Neurological: Positive for loss of balance.  Psychiatric/Behavioral: Positive for depression. The patient is nervous/anxious.    All other systems reviewed and are negative.   EKGs/Labs/Other Test Reviewed:    EKG:  EKG is  ordered today.  The ekg ordered today demonstrates NSR, HR 69, normal axis, LVH, TWI V5-6, QTc 437 ms, increased artifact.  Recent Labs: 12/31/2015: ALT 29; B Natriuretic Peptide 2,335.5 01/04/2016: Magnesium 1.9 01/12/2016: BUN 28; Creatinine, Ser 1.37; Hemoglobin 13.1; Platelets 213.0; Potassium 3.9; Sodium 142   Recent Lipid Panel    Component Value Date/Time   CHOL 124 01/25/2015 1357   TRIG 74.0 01/25/2015 1357   HDL 46.50 01/25/2015 1357   CHOLHDL 3 01/25/2015 1357   VLDL 14.8 01/25/2015 1357   LDLCALC 63 01/25/2015 1357     Physical Exam:    VS:  BP (!) 122/50   Pulse 70   Ht 5\' 7"  (1.702 m)   Wt 139 lb 12.8 oz (63.4 kg)   BMI 21.90 kg/m     Wt Readings from Last 3 Encounters:  01/30/16 139 lb 12.8 oz (63.4 kg)  01/26/16 140 lb (63.5 kg)  01/12/16 147 lb (66.7 kg)     Physical Exam  Constitutional: He is oriented to person, place, and time. He appears well-developed. He  appears cachectic. No distress.  HENT:  Head: Normocephalic and atraumatic.  Eyes: No scleral icterus.  Neck: No JVD present.  Cardiovascular: Normal rate and regular rhythm.   Murmur heard.  Low-pitched systolic murmur is present with a grade of 2/6  at the lower left sternal border Pulmonary/Chest: He has decreased breath sounds. He has no wheezes. He has no rhonchi. He has no rales.  Abdominal: Soft. There is no tenderness.  Musculoskeletal: He exhibits no edema.  Neurological: He is alert and oriented to person, place, and time.  Skin: Skin is warm and dry.  Psychiatric: He has a normal mood and affect.    ASSESSMENT:    1. Chronic systolic CHF (congestive heart failure) ([place])   2. Essential hypertension, benign   3. Hyperlipidemia with target LDL less than 100   4. PVC's (premature ventricular contractions)   5. CKD (chronic kidney disease), stage III    PLAN:    In order of problems listed above:  1. Chronic systolic CHF - EF 0000000 by Echo during recent admit. Etiology of DCM not clear but given WMA, it is possible this is an ischemic CM.  He is not felt to be a candidate for aggressive cardiac evaluation (ie Cardiac catheterization).  He is tx medically.  He was not placed on ACE/ARB last admit due to worsening renal function.  He is currently on beta-blocker, hydralazine, nitrates.  It is felt that avoiding aggressive lowering of his BP should be avoided given his hx of orthostatic hypotension.  Continue current regimen.  BMET today.  FU with Dr. Virl Axe in 3 mos.   2. HTN - BP controlled on current regimen.  3. HL - Continue statin.   4. PVCs - Prior Holter with 10-20% PVC burden. This was tx medically in the past.  No PVCs on ECG today.   5. CKD - SCr at FU with PCP on 9/14 was stable at 1.37. He has lost  more weight since then.  Will get repeat BMET today.     Medication Adjustments/Labs and Tests Ordered: Current medicines are reviewed at length with the  patient today.  Concerns regarding medicines are outlined above.  Medication changes, Labs and Tests ordered today are outlined in the Patient Instructions noted below. Patient Instructions  Medication Instructions:  Your physician recommends that you continue on your current medications as directed. Please refer to the Current Medication list given to you today.  Labwork: TODAY BMET  Testing/Procedures: NONE  Follow-Up: 05/08/16 WITH DR. Caryl Comes  Any Other Special Instructions Will Be Listed Below (If Applicable).  If you need a refill on your cardiac medications before your next appointment, please call your pharmacy.  Signed, Richardson Dopp, PA-C  01/30/2016 4:55 PM    [facility] [place], [place], [place]  [address] Phone: [phone]; Fax: [phone]

## 2016-01-31 ENCOUNTER — Telehealth: Payer: Self-pay | Admitting: *Deleted

## 2016-01-31 LAB — BASIC METABOLIC PANEL
BUN: 27 mg/dL — AB (ref 7–25)
CALCIUM: 9.1 mg/dL (ref 8.6–10.3)
CHLORIDE: 103 mmol/L (ref 98–110)
CO2: 28 mmol/L (ref 20–31)
Creat: 1.39 mg/dL — ABNORMAL HIGH (ref 0.70–1.11)
GLUCOSE: 73 mg/dL (ref 65–99)
POTASSIUM: 4.6 mmol/L (ref 3.5–5.3)
Sodium: 143 mmol/L (ref 135–146)

## 2016-01-31 NOTE — Telephone Encounter (Signed)
DPR for daughter [name] who has been notified of pt's lab results by phone with verbal understanding.

## 2016-02-01 ENCOUNTER — Encounter: Payer: Self-pay | Admitting: Internal Medicine

## 2016-02-01 ENCOUNTER — Ambulatory Visit (INDEPENDENT_AMBULATORY_CARE_PROVIDER_SITE_OTHER): Payer: PPO | Admitting: Internal Medicine

## 2016-02-01 ENCOUNTER — Telehealth: Payer: Self-pay | Admitting: Internal Medicine

## 2016-02-01 ENCOUNTER — Ambulatory Visit: Payer: Self-pay | Admitting: Internal Medicine

## 2016-02-01 ENCOUNTER — Other Ambulatory Visit: Payer: Self-pay | Admitting: *Deleted

## 2016-02-01 VITALS — BP 150/60 | HR 65 | Temp 98.0°F | Resp 16 | Ht 67.0 in | Wt 140.8 lb

## 2016-02-01 DIAGNOSIS — I519 Heart disease, unspecified: Secondary | ICD-10-CM

## 2016-02-01 DIAGNOSIS — B356 Tinea cruris: Secondary | ICD-10-CM

## 2016-02-01 DIAGNOSIS — I5022 Chronic systolic (congestive) heart failure: Secondary | ICD-10-CM | POA: Diagnosis not present

## 2016-02-01 DIAGNOSIS — I10 Essential (primary) hypertension: Secondary | ICD-10-CM

## 2016-02-01 MED ORDER — KETOCONAZOLE 2 % EX CREA: 1.0000 "application " | TOPICAL_CREAM | Freq: Two times a day (BID) | CUTANEOUS | 2 refills | Status: DC

## 2016-02-01 MED ORDER — TORSEMIDE 20 MG PO TABS: 20.0000 mg | ORAL_TABLET | Freq: Every day | ORAL | 1 refills | Status: DC

## 2016-02-01 MED ORDER — DRONABINOL 2.5 MG PO CAPS: 2.5000 mg | ORAL_CAPSULE | Freq: Two times a day (BID) | ORAL | 3 refills | Status: DC

## 2016-02-01 MED ORDER — POTASSIUM CHLORIDE CRYS ER 20 MEQ PO TBCR: 20.0000 meq | EXTENDED_RELEASE_TABLET | Freq: Every day | ORAL | 11 refills | Status: DC

## 2016-02-01 NOTE — Patient Instructions (Signed)

## 2016-02-01 NOTE — Patient Outreach (Signed)
[facility]) Care Management [place] Telephone Outreach, Transition of Care day 27 02/01/2016  [name] 03-10-1924 [id number]   Unsuccessful telephone outreach to Russell [name] 82 y.o. male referred to [facility] for transition of care after recent hospitalization September 2-6, 2017 for dyspnea, cough, orthopnea, and CHF exacerbation. Patient was diuresed during hospital visit and discharged home with [facility]) services for PT. Patient has history of HTN, CHF with EF of 20-25%, mild dementia and CKD. Patient has had two recent hospitalizations for CHF exacerbation. HIPAA compliant voice mail message left, asking for return call.  Plan:  If I do not hear back from patient this week, will re-attempt transition of care telephone outreach next week.    Oneta Rack, RN, BSN, [employer] [facility] Care Management  [phone]

## 2016-02-01 NOTE — Telephone Encounter (Signed)
Patient Name: Russell [name]  DOB: 22-Apr-1924    Initial Comment Caller states her fathers BP is 117/35.   Nurse Assessment  Nurse: Mallie Mussel, RN, Alveta Heimlich Date/Time Eilene Ghazi Time): 02/01/2016 8:35:45 AM  Confirm and document reason for call. If symptomatic, describe symptoms. You must click the next button to save text entered. ---Caller states that her father's BP was 117/35. She is not with her father. She is with her sister, Cyndra Numbers. The BP reading was about 10 minutes ago. 114/50 is the current reading. He denies being dizzy and light headed. He has been up and walking this morning.  Has the patient traveled out of the country within the last 30 days? ---No  Does the patient have any new or worsening symptoms? ---Yes  Will a triage be completed? ---Yes  Related visit to physician within the last 2 weeks? ---No  Does the PT have any chronic conditions? (i.e. diabetes, asthma, etc.) ---Yes  List chronic conditions. ---HTN, Anxiety, Hypercholesterolemia, Depression, Arthritis, Dementia  Is this a behavioral health or substance abuse call? ---No     Guidelines    Guideline Title Affirmed Question Affirmed Notes  Low Blood Pressure [id number] Fall in systolic BP > 20 mm Hg from normal AND [2] NOT dizzy, lightheaded, or weak (all triage questions negative)    Final Disposition User   See PCP When Office is Open (within 3 days) Mallie Mussel, RN, Alveta Heimlich    Comments  BP normally runs 138/57.  Caller requested an appointment for today. Dr. Ronnald Ramp does not have any availability today or tomorrow. Appointment scheduled for today at 5:15pm with Dr. Cathlean Cower.   Referrals  REFERRED TO PCP OFFICE   Disagree/Comply: Comply

## 2016-02-01 NOTE — Progress Notes (Signed)
Pre visit review using our clinic review tool, if applicable. No additional management support is needed unless otherwise documented below in the visit note. 

## 2016-02-01 NOTE — Progress Notes (Signed)
Subjective:  Patient ID: Russell [name], male    DOB: [birth year]  Age: 82 y.o. MRN: [id number]  CC: Rash and Hypertension   HPI Russell [name] presents for concerns about a groin rash and fluctuating blood pressure. He tells me in the morning that when he takes furosemide about an hour later his blood pressure drops down to about 114/35 and he feels fatigued, orthostatic, lightheaded, and dizzy. He denies chest pain, shortness of breath, edema, palpitations, or near-syncope.  He and his family also concerned about a poor appetite. He is losing weight rapidly. They think over the last month his weight has decreased from 156 pounds to 140 pounds.  He had so complains about an intermittent rash in his groin around the inguinal folds and his scrotum.  Outpatient Medications Prior to Visit  Medication Sig Dispense Refill  . ALPRAZolam (XANAX) 0.5 MG tablet Take 0.5 mg by mouth 3 (three) times daily as needed for anxiety.    Marland Kitchen aspirin 81 MG tablet Take 81 mg by mouth daily.      . Azelastine-Fluticasone 137-50 MCG/ACT SUSP Place 1 spray into the nose 2 (two) times daily as needed.    . carvedilol (COREG) 3.125 MG tablet Take 1 tablet (3.125 mg total) by mouth 2 (two) times daily with a meal. 60 tablet 1  . Cyanocobalamin (VITAMIN B-12 PO) Take 1 tablet by mouth daily with breakfast.    . divalproex (DEPAKOTE ER) 250 MG 24 hr tablet Take 1 tablet (250 mg total) by mouth daily. 30 tablet 11  . fesoterodine (TOVIAZ) 8 MG TB24 tablet Take 1 tablet (8 mg total) by mouth daily. 30 tablet 11  . HYDROcodone-acetaminophen (NORCO/VICODIN) 5-325 MG tablet Take 1 tablet by mouth every 6 (six) hours as needed for moderate pain. 90 tablet 0  . isosorbide-hydrALAZINE (BIDIL) 20-37.5 MG tablet Take 1 tablet by mouth 3 (three) times daily. 90 tablet 0  . ONE TOUCH ULTRA TEST test strip     . ONETOUCH DELICA LANCETS 99991111 MISC Use to test blood sugar 1-2 times per day. Dx E11.9 100 each 2  . polyethylene glycol  (MIRALAX / GLYCOLAX) packet Take 17 g by mouth daily.    . sertraline (ZOLOFT) 100 MG tablet Take 100 mg by mouth daily.    . simvastatin (ZOCOR) 80 MG tablet Take 40 mg by mouth daily at 6 PM.   3  . furosemide (LASIX) 40 MG tablet Take 1 tablet (40 mg total) by mouth daily. 30 tablet 1  . potassium chloride SA (K-DUR,KLOR-CON) 20 MEQ tablet Take 1 tablet (20 mEq total) by mouth daily. 30 tablet 0  . promethazine-dextromethorphan (PROMETHAZINE-DM) 6.25-15 MG/5ML syrup Take 5 mLs by mouth 4 (four) times daily as needed for cough. 118 mL 2   No facility-administered medications prior to visit.     ROS Review of Systems  Constitutional: Positive for activity change, appetite change, fatigue and unexpected weight change. Negative for chills, diaphoresis and fever.       He complains of loss of appetite, 16 pound weight loss, generalized weakness and fatigue  HENT: Negative.  Negative for sore throat and trouble swallowing.   Eyes: Negative.   Respiratory: Negative.  Negative for cough, choking, chest tightness, shortness of breath and stridor.   Cardiovascular: Negative.  Negative for chest pain and leg swelling.  Gastrointestinal: Negative.  Negative for abdominal pain, blood in stool, constipation, diarrhea, nausea and vomiting.  Endocrine: Negative.   Genitourinary: Negative.   Musculoskeletal: Negative.  Negative for back pain, myalgias and neck pain.  Skin: Positive for rash. Negative for color change and wound.  Allergic/Immunologic: Negative.   Neurological: Positive for weakness. Negative for dizziness, syncope, numbness and headaches.  Hematological: Negative.  Negative for adenopathy. Does not bruise/bleed easily.    Objective:  BP (!) 150/60 (BP Location: Left Arm, Patient Position: Sitting, Cuff Size: Normal)   Pulse 65   Temp 98 F (36.7 C) (Oral)   Resp 16   Ht 5\' 7"  (1.702 m)   Wt 140 lb 12 oz (63.8 kg)   SpO2 98%   BMI 22.04 kg/m   BP Readings from Last 3  Encounters:  02/01/16 (!) 150/60  01/30/16 (!) 122/50  01/26/16 122/60    Wt Readings from Last 3 Encounters:  02/01/16 140 lb 12 oz (63.8 kg)  01/30/16 139 lb 12.8 oz (63.4 kg)  01/26/16 140 lb (63.5 kg)    Physical Exam  Constitutional: He is oriented to person, place, and time. No distress.  HENT:  Mouth/Throat: Oropharynx is clear and moist. No oropharyngeal exudate.  Eyes: Conjunctivae are normal. Right eye exhibits no discharge. Left eye exhibits no discharge. No scleral icterus.  Neck: Normal range of motion. Neck supple. No JVD present. No tracheal deviation present. No thyromegaly present.  Cardiovascular: Normal rate, regular rhythm, normal heart sounds and intact distal pulses.  Exam reveals no gallop and no friction rub.   No murmur heard. Pulmonary/Chest: Effort normal and breath sounds normal. No stridor. No respiratory distress. He has no wheezes. He has no rales. He exhibits no tenderness.  Abdominal: Soft. Bowel sounds are normal. He exhibits no distension and no mass. There is no tenderness. There is no rebound and no guarding. Hernia confirmed negative in the right inguinal area and confirmed negative in the left inguinal area.  Genitourinary: Testes normal and penis normal. Right testis shows no mass, no swelling and no tenderness. Right testis is descended. Left testis shows no mass, no swelling and no tenderness. Left testis is descended. Circumcised. No phimosis, paraphimosis, hypospadias, penile erythema or penile tenderness. No discharge found.  Genitourinary Comments: There is mild erythema in the intertriginous folds in the groin  Musculoskeletal: Normal range of motion. He exhibits no edema, tenderness or deformity.  Lymphadenopathy:    He has no cervical adenopathy.       Right: No inguinal adenopathy present.       Left: No inguinal adenopathy present.  Neurological: He is oriented to person, place, and time.  Skin: Skin is warm and dry. Rash noted. He is  not diaphoretic. No erythema. No pallor.  Vitals reviewed.   Lab Results  Component Value Date   WBC 4.2 01/12/2016   HGB 13.1 01/12/2016   HCT 39.3 01/12/2016   PLT 213.0 01/12/2016   GLUCOSE 73 01/30/2016   CHOL 124 01/25/2015   TRIG 74.0 01/25/2015   HDL 46.50 01/25/2015   LDLCALC 63 01/25/2015   ALT 29 12/31/2015   AST 52 (H) 12/31/2015   NA 143 01/30/2016   K 4.6 01/30/2016   CL 103 01/30/2016   CREATININE 1.39 (H) 01/30/2016   BUN 27 (H) 01/30/2016   CO2 28 01/30/2016   TSH 1.86 01/25/2015   PSA 1.94 02/20/2013   INR 1.22 12/17/2015   HGBA1C 5.4 12/17/2015   MICROALBUR 2.2 (H) 01/25/2015    Dg Chest 2 View  Result Date: 01/12/2016 CLINICAL DATA:  Follow-up pneumonia. EXAM: CHEST  2 VIEW COMPARISON:  01/01/2016 FINDINGS: There is  no focal parenchymal opacity. There are trace bilateral pleural effusions. There is no pneumothorax. There is stable cardiomegaly. The osseous structures are unremarkable. IMPRESSION: Trace bilateral pleural effusions. Electronically Signed   By: Kathreen Devoid   On: 01/12/2016 16:24    Assessment & Plan:   Jasiel was seen today for rash and hypertension.  Diagnoses and all orders for this visit:  Chronic systolic CHF (congestive heart failure) ([place])- he has a normal volume status but is not tolerating furosemide well, will change to torsemide for more smooth mechanism of action and blood pressure control. -     torsemide (DEMADEX) 20 MG tablet; Take 1 tablet (20 mg total) by mouth daily.  Essential hypertension, benign- I think torsemide will be a more smooth diuretic then furosemide. Will discontinue furosemide and start torsemide once a day. Also continue potassium replacement therapy. -     torsemide (DEMADEX) 20 MG tablet; Take 1 tablet (20 mg total) by mouth daily. -     potassium chloride SA (K-DUR,KLOR-CON) 20 MEQ tablet; Take 1 tablet (20 mEq total) by mouth daily.  Cachexia associated with cardiac disease- he has had a drastic  weight loss, will try Marinol as an appetite stimulator -     dronabinol (MARINOL) 2.5 MG capsule; Take 1 capsule (2.5 mg total) by mouth 2 (two) times daily before a meal.  Tinea cruris -     ketoconazole (NIZORAL) 2 % cream; Apply 1 application topically 2 (two) times daily.   I have discontinued Mr. Rigor promethazine-dextromethorphan and furosemide. I am also having him start on torsemide, dronabinol, and ketoconazole. Additionally, I am having him maintain his aspirin, fesoterodine, simvastatin, Cyanocobalamin (VITAMIN B-12 PO), HYDROcodone-acetaminophen, isosorbide-hydrALAZINE, divalproex, ONETOUCH DELICA LANCETS 99991111, carvedilol, ALPRAZolam, ONE TOUCH ULTRA TEST, Azelastine-Fluticasone, polyethylene glycol, sertraline, and potassium chloride SA.  Meds ordered this encounter  Medications  . torsemide (DEMADEX) 20 MG tablet    Sig: Take 1 tablet (20 mg total) by mouth daily.    Dispense:  90 tablet    Refill:  1  . dronabinol (MARINOL) 2.5 MG capsule    Sig: Take 1 capsule (2.5 mg total) by mouth 2 (two) times daily before a meal.    Dispense:  60 capsule    Refill:  3  . ketoconazole (NIZORAL) 2 % cream    Sig: Apply 1 application topically 2 (two) times daily.    Dispense:  60 g    Refill:  2  . potassium chloride SA (K-DUR,KLOR-CON) 20 MEQ tablet    Sig: Take 1 tablet (20 mEq total) by mouth daily.    Dispense:  30 tablet    Refill:  11     Follow-up: Return in about 3 months (around 05/03/2016).  Scarlette Calico, MD

## 2016-02-02 ENCOUNTER — Telehealth: Payer: Self-pay | Admitting: Podiatry

## 2016-02-02 NOTE — Telephone Encounter (Signed)
Left message for patient to schedule an appointment with Melody to be measured for Diabetic shoes. °

## 2016-02-06 ENCOUNTER — Other Ambulatory Visit: Payer: Self-pay | Admitting: Internal Medicine

## 2016-02-06 ENCOUNTER — Telehealth: Payer: Self-pay | Admitting: *Deleted

## 2016-02-06 ENCOUNTER — Other Ambulatory Visit: Payer: Self-pay | Admitting: *Deleted

## 2016-02-06 DIAGNOSIS — I5022 Chronic systolic (congestive) heart failure: Secondary | ICD-10-CM

## 2016-02-06 MED ORDER — ISOSORB DINITRATE-HYDRALAZINE 20-37.5 MG PO TABS: 1.0000 | ORAL_TABLET | Freq: Three times a day (TID) | ORAL | 11 refills | Status: DC

## 2016-02-06 NOTE — Telephone Encounter (Signed)
Pt daughter left msg on triage stating dad was rx Bidil while he was in the hospital, and he has ran out of med. Wanting to know if Md want him to continue taking if so will need rx sent to [facility]...lmb

## 2016-02-06 NOTE — Patient Outreach (Signed)
[facility]) Care Management [place] Telephone Outreach, Transition of Care day 32 02/06/2016  Lucan Gilberg [birth year] [id number]  Unsuccessful telephone outreach to Drema Balzarine, daughter/ caregiver (on [facility] written consent) for Khi, [age] y.o.malereferred to [place] for transition of care after recent hospitalization September 2-6, 2017 for dyspnea, cough, orthopnea, and CHF exacerbation. Patient was diuresed during hospital visit and discharged home with Home Health [facility]) services for PT. Patient has history of HTN, CHF with EF of 20-25%, mild dementia and CKD. Patient has had two recent hospitalizations for CHF exacerbation. HIPAA compliant message left with person answering phone, asking for return call from Ms. Herbin.  Plan:  If I do not hear back from Ms. Herbin will re-attempt transition of care telephone outreach later this week.    Oneta Rack, RN, BSN, [employer] [facility] Care Management  [phone]

## 2016-02-06 NOTE — Telephone Encounter (Signed)
Notified daughter MD sent new rx to [facility]...[name]

## 2016-02-06 NOTE — Telephone Encounter (Signed)
RX sent

## 2016-02-07 ENCOUNTER — Encounter: Payer: Self-pay | Admitting: *Deleted

## 2016-02-07 ENCOUNTER — Other Ambulatory Visit: Payer: Self-pay | Admitting: *Deleted

## 2016-02-07 NOTE — Patient Outreach (Signed)
[place] [facility]) Care Management [place] Telephone Outreach 02/07/2016  Russell [name] 01/02/1924 [id number]  Successful telephone outreach to Russell [name], daughter/ caregiver (on [facility] written consent) for patient Russell [name], 82 y.o. male referred to [place] for transition of care after recent hospitalization September 2-6, 2017 for dyspnea, cough, orthopnea, and CHF exacerbation. Patient was diuresed during hospital visit and discharged home with Home Health [facility]) services for PT. Patient has history of HTN, CHF with EF of 20-25%, mild dementia and CKD. Patient has had two recent hospitalizations for CHF exacerbation. HIPAA/ identity verified with Ms. Russell [name] today.  Today, Russell [name] reports that patient "is doing real good," and confirms that he attended his recent PCP appointment, "last week," where she reports that "some of his medications were changed, and it is helping."  Russell [name] reports that patient has continued working with [facility] services as they are ordered, and also reports that his personal assistant through social services has continued to be involved in his weekly care as well, stating, "she comes to check on Daddy 4 days every week."  Russell [name] also reports that she has an upcoming visit with patient's case worker "next week," where patient's resource needs will be updated.  Russell [name] reports today that patient has continued monitoring and recording his daily weights with the assistance of his personal assistant through social services/ family members, and also confirms that they have been continuing to restrict patient's fluids to 1.5 L/day, as advised by patient's PCP.  Russell [name] verbalizes a good understanding of CHF zones/ action plans.  Russell [name] denies further questions, concerns, problems or issues today.  We acknowledged that patient has successfully met his previously established [place] CM goals for transition of care after his recent hospital visit, and  Russell [name] denies further care coordination needs at present, stating, "I think we've got everything we need, and I think we know what to do to keep him as healthy as we can."  Positive reinforcement and encouragement was provided to [place] today.  Russell [name] agreed that patient was ready for [place] discharge, and I confirmed that she had my direct telephone number, the main number for [facility] CM, and the [facility] 24-hour nurse line phone number, should patient have needs in the future.  Plan:  Will close [place] case, as patient has successfully met his transition of care goals, and patient's caregiver denies further care coordination needs at this time.  Will make patient's PCP aware of same.  Oneta Rack, RN, BSN, [employer] [facility] Care Management  [phone]

## 2016-02-08 ENCOUNTER — Ambulatory Visit: Payer: Self-pay | Admitting: *Deleted

## 2016-02-14 ENCOUNTER — Telehealth: Payer: Self-pay | Admitting: Emergency Medicine

## 2016-02-14 NOTE — Telephone Encounter (Signed)
Hospice called and patient can now be admitted to hospice. She also wants to make sure MD wants to be the attending of care? Please advise thanks.

## 2016-02-15 NOTE — Telephone Encounter (Signed)
Hospice is calling back about verbal order to admit patient to Hospice and Md being the attending of care. Thanks.

## 2016-02-15 NOTE — Telephone Encounter (Signed)
Ok with me 

## 2016-02-15 NOTE — Telephone Encounter (Signed)
Called Hospice and spoke to [place]. Verbal order for pt to be admitted to hospice given. PCP will be attending and Hospice MD will take care of palliative orders.

## 2016-02-21 ENCOUNTER — Other Ambulatory Visit: Payer: PPO

## 2016-02-21 ENCOUNTER — Ambulatory Visit: Payer: PPO | Admitting: Sports Medicine

## 2016-02-22 ENCOUNTER — Ambulatory Visit: Payer: Self-pay | Admitting: Neurology

## 2016-02-25 ENCOUNTER — Other Ambulatory Visit: Payer: Self-pay | Admitting: Internal Medicine

## 2016-02-29 ENCOUNTER — Encounter: Payer: Self-pay | Admitting: Neurology

## 2016-02-29 ENCOUNTER — Ambulatory Visit (INDEPENDENT_AMBULATORY_CARE_PROVIDER_SITE_OTHER): Payer: PPO | Admitting: Neurology

## 2016-02-29 VITALS — BP 140/62 | HR 66 | Ht 67.0 in | Wt 147.3 lb

## 2016-02-29 DIAGNOSIS — F03B18 Unspecified dementia, moderate, with other behavioral disturbance: Secondary | ICD-10-CM

## 2016-02-29 DIAGNOSIS — F0391 Unspecified dementia with behavioral disturbance: Secondary | ICD-10-CM | POA: Diagnosis not present

## 2016-02-29 MED ORDER — DIVALPROEX SODIUM ER 250 MG PO TB24: 250.0000 mg | ORAL_TABLET | Freq: Every day | ORAL | 11 refills | Status: DC
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
## 2016-02-29 NOTE — Patient Instructions (Signed)
1. Continue Depakote 2. Continue 24/7 care 3. Follow-up in 6 months, call for any changes

## 2016-03-06 ENCOUNTER — Encounter: Payer: Self-pay | Admitting: Podiatry

## 2016-03-06 ENCOUNTER — Ambulatory Visit (INDEPENDENT_AMBULATORY_CARE_PROVIDER_SITE_OTHER): Payer: PPO | Admitting: Podiatry

## 2016-03-06 ENCOUNTER — Other Ambulatory Visit: Payer: PPO

## 2016-03-06 VITALS — BP 132/58 | HR 65 | Resp 14

## 2016-03-06 DIAGNOSIS — M79675 Pain in left toe(s): Secondary | ICD-10-CM

## 2016-03-06 DIAGNOSIS — B351 Tinea unguium: Secondary | ICD-10-CM | POA: Diagnosis not present

## 2016-03-06 DIAGNOSIS — M79674 Pain in right toe(s): Secondary | ICD-10-CM

## 2016-03-06 NOTE — Patient Instructions (Signed)
Diabetes and Foot Care Diabetes may cause you to have problems because of poor blood supply (circulation) to your feet and legs. This may cause the skin on your feet to become thinner, break easier, and heal more slowly. Your skin may become dry, and the skin may peel and crack. You may also have nerve damage in your legs and feet causing decreased feeling in them. You may not notice minor injuries to your feet that could lead to infections or more serious problems. Taking care of your feet is one of the most important things you can do for yourself.  HOME CARE INSTRUCTIONS  Wear shoes at all times, even in the house. Do not go barefoot. Bare feet are easily injured.  Check your feet daily for blisters, cuts, and redness. If you cannot see the bottom of your feet, use a mirror or ask someone for help.  Wash your feet with warm water (do not use hot water) and mild soap. Then pat your feet and the areas between your toes until they are completely dry. Do not soak your feet as this can dry your skin.  Apply a moisturizing lotion or petroleum jelly (that does not contain alcohol and is unscented) to the skin on your feet and to dry, brittle toenails. Do not apply lotion between your toes.  Trim your toenails straight across. Do not dig under them or around the cuticle. File the edges of your nails with an emery board or nail file.  Do not cut corns or calluses or try to remove them with medicine.  Wear clean socks or stockings every day. Make sure they are not too tight. Do not wear knee-high stockings since they may decrease blood flow to your legs.  Wear shoes that fit properly and have enough cushioning. To break in new shoes, wear them for just a few hours a day. This prevents you from injuring your feet. Always look in your shoes before you put them on to be sure there are no objects inside.  Do not cross your legs. This may decrease the blood flow to your feet.  If you find a minor scrape,  cut, or break in the skin on your feet, keep it and the skin around it clean and dry. These areas may be cleansed with mild soap and water. Do not cleanse the area with peroxide, alcohol, or iodine.  When you remove an adhesive bandage, be sure not to damage the skin around it.  If you have a wound, look at it several times a day to make sure it is healing.  Do not use heating pads or hot water bottles. They may burn your skin. If you have lost feeling in your feet or legs, you may not know it is happening until it is too late.  Make sure your health care provider performs a complete foot exam at least annually or more often if you have foot problems. Report any cuts, sores, or bruises to your health care provider immediately. SEEK MEDICAL CARE IF:   You have an injury that is not healing.  You have cuts or breaks in the skin.  You have an ingrown nail.  You notice redness on your legs or feet.  You feel burning or tingling in your legs or feet.  You have pain or cramps in your legs and feet.  Your legs or feet are numb.  Your feet always feel cold. SEEK IMMEDIATE MEDICAL CARE IF:   There is increasing redness,   swelling, or pain in or around a wound.  There is a red line that goes up your leg.  Pus is coming from a wound.  You develop a fever or as directed by your health care provider.  You notice a bad smell coming from an ulcer or wound.   This information is not intended to replace advice given to you by your health care provider. Make sure you discuss any questions you have with your health care provider.   Document Released: 04/13/2000 Document Revised: 12/17/2012 Document Reviewed: 09/23/2012 Elsevier Interactive Patient Education 2016 [employer] Inc[employer]  

## 2016-03-06 NOTE — Progress Notes (Signed)
Patient ID: Russell [name], male   DOB: 1923/12/27, 82 y.o.   MRN: [id number]    Subjective: This patient presents today complaining of painful toenails walking wearing shoes and is requesting nail debridement. The patient's daughter is present in the treatment room today since daughters request replacement diabetic shoes  Objective: Patient appears orientated 3 DP and PT pulses 2/4 bilaterally Sensation to 10 g monofilament wire intact 0/5 bilaterally Vibratory sensation nonreactive bilaterally Ankle reflexes weakly reactive bilaterally Hammertoe second bilaterally Slow gait requiring cane No open skin lesions noted bilaterally Mild distal keratoses hallux bilaterally The toenails are elongated, brittle, deformed, hypertrophic and tender to direct palpation 6-10  Assessment: Symptomatic onychomycoses 6-10 Type II diabetic diet-controlled Diabetic neuropathy Hammertoe second bilaterally Gait disturbance  Plan: Debridement toenails 10 mechanical and electrically without any bleeding Obtain certification for diabetic shoes, pending  Reappoint 3 months

## 2016-03-07 ENCOUNTER — Telehealth: Payer: Self-pay | Admitting: *Deleted

## 2016-03-07 NOTE — Telephone Encounter (Signed)
Called daughter back verified info per chart Md has never order med from [facility]. Pt had doctors mix up she was needing to call his urologist Dr. Arther Dames...Russell [name]

## 2016-03-07 NOTE — Telephone Encounter (Signed)
Daughter left msg on triage stating dad is almost out of his Lisbeth Ply. Needing medication reorder from manufacturer...[name]

## 2016-03-13 ENCOUNTER — Telehealth: Payer: Self-pay | Admitting: Internal Medicine

## 2016-03-13 NOTE — Telephone Encounter (Signed)
Let me know if there is anything that I need to do to assist.

## 2016-03-13 NOTE — Telephone Encounter (Signed)
[name] came in and dropped off a MOST form for completion. Sending directly to dr Ronnald Ramp. Please call brenda when completed

## 2016-03-14 NOTE — Telephone Encounter (Signed)
Forwarding to PCP as an Russell [name] to inform that form needs to be filled out prior to PCP signing. Russell [name] is coming in tomorrow and she will bring the Living Will that has all of the pt wishes documented. Russell [name] stated that she needs help with understanding what the MOST form is asking for.

## 2016-03-19 ENCOUNTER — Encounter: Payer: Self-pay | Admitting: Internal Medicine

## 2016-03-19 ENCOUNTER — Ambulatory Visit (INDEPENDENT_AMBULATORY_CARE_PROVIDER_SITE_OTHER): Payer: PPO | Admitting: Internal Medicine

## 2016-03-19 VITALS — BP 140/58 | HR 81 | Temp 98.0°F | Resp 16 | Ht 67.0 in | Wt 145.5 lb

## 2016-03-19 DIAGNOSIS — I10 Essential (primary) hypertension: Secondary | ICD-10-CM

## 2016-03-19 DIAGNOSIS — I5022 Chronic systolic (congestive) heart failure: Secondary | ICD-10-CM | POA: Diagnosis not present

## 2016-03-19 MED ORDER — PROMETHAZINE-DM 6.25-15 MG/5ML PO SYRP: 5.0000 mL | ORAL_SOLUTION | Freq: Four times a day (QID) | ORAL | 1 refills | Status: DC | PRN

## 2016-03-19 NOTE — Progress Notes (Signed)
Pre visit review using our clinic review tool, if applicable. No additional management support is needed unless otherwise documented below in the visit note. 

## 2016-03-19 NOTE — Progress Notes (Signed)
Subjective:  Patient ID: Russell [name], male    DOB: 1924/03/16  Age: 82 y.o. MRN: [id number]  CC: Hypertension and Congestive Heart Failure   HPI Almalik Tritto presents for f/up - He complains of rare, nonproductive cough that is controlled with Phenergan DM. He has had no recent episodes of chest pain, shortness of breath, edema, palpitations, or fatigue.  He is with his 2 daughters today and they want to have the MOST form completed. They want him to receive all levels of medical care and support.  Outpatient Medications Prior to Visit  Medication Sig Dispense Refill  . ALPRAZolam (XANAX) 0.5 MG tablet Take 0.5 mg by mouth 3 (three) times daily as needed for anxiety.    Marland Kitchen aspirin 81 MG tablet Take 81 mg by mouth daily.      . Azelastine-Fluticasone 137-50 MCG/ACT SUSP Place 1 spray into the nose 2 (two) times daily as needed.    . carvedilol (COREG) 3.125 MG tablet Take 1 tablet (3.125 mg total) by mouth 2 (two) times daily with a meal. 60 tablet 1  . Cyanocobalamin (VITAMIN B-12 PO) Take 1 tablet by mouth daily with breakfast.    . divalproex (DEPAKOTE ER) 250 MG 24 hr tablet Take 1 tablet (250 mg total) by mouth daily. 30 tablet 11  . dronabinol (MARINOL) 2.5 MG capsule Take 1 capsule (2.5 mg total) by mouth 2 (two) times daily before a meal. 60 capsule 3  . fesoterodine (TOVIAZ) 8 MG TB24 tablet Take 1 tablet (8 mg total) by mouth daily. 30 tablet 11  . HYDROcodone-acetaminophen (NORCO/VICODIN) 5-325 MG tablet Take 1 tablet by mouth every 6 (six) hours as needed for moderate pain. 90 tablet 0  . isosorbide-hydrALAZINE (BIDIL) 20-37.5 MG tablet Take 1 tablet by mouth 3 (three) times daily. 90 tablet 11  . ketoconazole (NIZORAL) 2 % cream Apply 1 application topically 2 (two) times daily. 60 g 2  . ONE TOUCH ULTRA TEST test strip     . ONETOUCH DELICA LANCETS 99991111 MISC Use to test blood sugar 1-2 times per day. Dx E11.9 100 each 2  . polyethylene glycol (MIRALAX / GLYCOLAX) packet Take  17 g by mouth daily.    . potassium chloride SA (K-DUR,KLOR-CON) 20 MEQ tablet Take 1 tablet (20 mEq total) by mouth daily. 30 tablet 11  . sertraline (ZOLOFT) 100 MG tablet Take 100 mg by mouth daily.    . simvastatin (ZOCOR) 80 MG tablet Take 40 mg by mouth daily at 6 PM.   3  . torsemide (DEMADEX) 20 MG tablet Take 1 tablet (20 mg total) by mouth daily. 90 tablet 1   No facility-administered medications prior to visit.     ROS Review of Systems  Constitutional: Negative.  Negative for activity change, appetite change, diaphoresis, fatigue and unexpected weight change.  HENT: Negative.  Negative for sore throat and trouble swallowing.   Eyes: Negative for visual disturbance.  Respiratory: Positive for cough. Negative for apnea, choking, chest tightness, shortness of breath and stridor.   Cardiovascular: Negative.  Negative for chest pain, palpitations and leg swelling.  Gastrointestinal: Negative for abdominal pain, constipation, diarrhea, nausea and vomiting.  Endocrine: Negative.   Genitourinary: Negative.   Musculoskeletal: Negative.  Negative for back pain, myalgias and neck pain.  Skin: Negative.  Negative for color change and rash.  Allergic/Immunologic: Negative.   Neurological: Negative.  Negative for dizziness, weakness, light-headedness and numbness.  Hematological: Negative.  Negative for adenopathy. Does not bruise/bleed easily.  Psychiatric/Behavioral: Negative.     Objective:  BP (!) 140/58 (BP Location: Left Arm, Patient Position: Sitting, Cuff Size: Normal)   Pulse 81   Temp 98 F (36.7 C) (Oral)   Resp 16   Ht 5\' 7"  (1.702 m)   Wt 145 lb 8 oz (66 kg)   SpO2 96%   BMI 22.79 kg/m   BP Readings from Last 3 Encounters:  03/19/16 (!) 140/58  03/06/16 (!) 132/58  02/29/16 140/62    Wt Readings from Last 3 Encounters:  03/19/16 145 lb 8 oz (66 kg)  02/29/16 147 lb 5 oz (66.8 kg)  02/01/16 140 lb 12 oz (63.8 kg)    Physical Exam  Constitutional: He is  oriented to person, place, and time. No distress.  HENT:  Mouth/Throat: Oropharynx is clear and moist. No oropharyngeal exudate.  Eyes: Conjunctivae are normal. Right eye exhibits no discharge. Left eye exhibits no discharge. No scleral icterus.  Neck: Normal range of motion. Neck supple. No JVD present. No tracheal deviation present. No thyromegaly present.  Cardiovascular: Normal rate, regular rhythm, normal heart sounds and intact distal pulses.  Exam reveals no gallop and no friction rub.   No murmur heard. Pulmonary/Chest: Effort normal and breath sounds normal. No stridor. No respiratory distress. He has no wheezes. He has no rales. He exhibits no tenderness.  Abdominal: Soft. Bowel sounds are normal. He exhibits no distension and no mass. There is no tenderness. There is no rebound and no guarding.  Musculoskeletal: Normal range of motion. He exhibits no edema, tenderness or deformity.  Lymphadenopathy:    He has no cervical adenopathy.  Neurological: He is oriented to person, place, and time.  Skin: Skin is warm and dry. No rash noted. He is not diaphoretic. No erythema. No pallor.  Vitals reviewed.   Lab Results  Component Value Date   WBC 4.2 01/12/2016   HGB 13.1 01/12/2016   HCT 39.3 01/12/2016   PLT 213.0 01/12/2016   GLUCOSE 73 01/30/2016   CHOL 124 01/25/2015   TRIG 74.0 01/25/2015   HDL 46.50 01/25/2015   LDLCALC 63 01/25/2015   ALT 29 12/31/2015   AST 52 (H) 12/31/2015   NA 143 01/30/2016   K 4.6 01/30/2016   CL 103 01/30/2016   CREATININE 1.39 (H) 01/30/2016   BUN 27 (H) 01/30/2016   CO2 28 01/30/2016   TSH 1.86 01/25/2015   PSA 1.94 02/20/2013   INR 1.22 12/17/2015   HGBA1C 5.4 12/17/2015   MICROALBUR 2.2 (H) 01/25/2015    Dg Chest 2 View  Result Date: 01/12/2016 CLINICAL DATA:  Follow-up pneumonia. EXAM: CHEST  2 VIEW COMPARISON:  01/01/2016 FINDINGS: There is no focal parenchymal opacity. There are trace bilateral pleural effusions. There is no  pneumothorax. There is stable cardiomegaly. The osseous structures are unremarkable. IMPRESSION: Trace bilateral pleural effusions. Electronically Signed   By: Kathreen Devoid   On: 01/12/2016 16:24    Assessment & Plan:   Dalston was seen today for hypertension and congestive heart failure.  Diagnoses and all orders for this visit:  Essential hypertension, benign- his blood pressures adequately well-controlled. Will continue the current 4 drug regimen of carvedilol, torsemide, isosorbide, and hydralazine for blood pressure control.  Chronic systolic CHF (congestive heart failure) ([place])- he has a normal volume status today. Will continue Phenergan DM as needed for the cough. Will continue torsemide for diuresis and isosorbide and hydralazine for blood pressure control and afterload reduction. -     promethazine-dextromethorphan (PROMETHAZINE-DM)  6.25-15 MG/5ML syrup; Take 5 mLs by mouth 4 (four) times daily as needed for cough.   I am having Mr. Warrenfeltz start on promethazine-dextromethorphan. I am also having him maintain his aspirin, fesoterodine, simvastatin, Cyanocobalamin (VITAMIN B-12 PO), HYDROcodone-acetaminophen, ONETOUCH DELICA LANCETS 99991111, carvedilol, ALPRAZolam, ONE TOUCH ULTRA TEST, Azelastine-Fluticasone, polyethylene glycol, sertraline, torsemide, dronabinol, ketoconazole, potassium chloride SA, isosorbide-hydrALAZINE, and divalproex.  Meds ordered this encounter  Medications  . promethazine-dextromethorphan (PROMETHAZINE-DM) 6.25-15 MG/5ML syrup    Sig: Take 5 mLs by mouth 4 (four) times daily as needed for cough.    Dispense:  240 mL    Refill:  1     Follow-up: No Follow-up on file.  Scarlette Calico, MD

## 2016-03-19 NOTE — Patient Instructions (Signed)
Heart Failure °Heart failure is a condition in which the heart has trouble pumping blood because it has become weak or stiff. This means that the heart does not pump blood efficiently for the body to work well. For some people with heart failure, fluid may back up into the lungs and there may be swelling (edema) in the lower legs. Heart failure is usually a long-term (chronic) condition. It is important for you to take good care of yourself and follow the treatment plan from your health care provider. °What are the causes? °This condition is caused by some health problems, including: °· High blood pressure (hypertension). Hypertension causes the heart muscle to work harder than normal. High blood pressure eventually causes the heart to become stiff and weak. °· Coronary artery disease (CAD). CAD is the buildup of cholesterol and fat (plaques) in the arteries of the heart. °· Heart attack (myocardial infarction). Injured tissue, which is caused by the heart attack, does not contract as well and the heart's ability to pump blood is weakened. °· Abnormal heart valves. When the heart valves do not open and close properly, the heart muscle must pump harder to keep the blood flowing. °· Heart muscle disease (cardiomyopathy or myocarditis). Heart muscle disease is damage to the heart muscle from a variety of causes, such as drug or alcohol abuse, infections, or unknown causes. These can increase the risk of heart failure. °· Lung disease. When the lungs do not work properly, the heart must work harder. ° °What increases the risk? °Risk of heart failure increases as a person ages. This condition is also more likely to develop in people who: °· Are overweight. °· Are male. °· Smoke or chew tobacco. °· Abuse alcohol or illegal drugs. °· Have taken medicines that can damage the heart, such as chemotherapy drugs. °· Have diabetes. °? High blood sugar (glucose) is associated with high fat (lipid) levels in the blood. °? Diabetes  can also damage tiny blood vessels that carry nutrients to the heart muscle. °· Have abnormal heart rhythms. °· Have thyroid problems. °· Have low blood counts (anemia). ° °What are the signs or symptoms? °Symptoms of this condition include: °· Shortness of breath with activity, such as when climbing stairs. °· Persistent cough. °· Swelling of the feet, ankles, legs, or abdomen. °· Unexplained weight gain. °· Difficulty breathing when lying flat (orthopnea). °· Waking from sleep because of the need to sit up and get more air. °· Rapid heartbeat. °· Fatigue and loss of energy. °· Feeling light-headed, dizzy, or close to fainting. °· Loss of appetite. °· Nausea. °· Increased urination during the night (nocturia). °· Confusion. ° °How is this diagnosed? °This condition is diagnosed based on: °· Medical history, symptoms, and a physical exam. °· Diagnostic tests, which may include: °? Echocardiogram. °? Electrocardiogram (ECG). °? Chest X-ray. °? Blood tests. °? Exercise stress test. °? Radionuclide scans. °? Cardiac catheterization and angiogram. ° °How is this treated? °Treatment for this condition is aimed at managing the symptoms of heart failure. Medicines, behavioral changes, or other treatments may be necessary to treat heart failure. °Medicines °These may include: °· Angiotensin-converting enzyme (ACE) inhibitors. This type of medicine blocks the effects of a blood protein called angiotensin-converting enzyme. ACE inhibitors relax (dilate) the blood vessels and help to lower blood pressure. °· Angiotensin receptor blockers (ARBs). This type of medicine blocks the actions of a blood protein called angiotensin. ARBs dilate the blood vessels and help to lower blood pressure. °· Water   pills (diuretics). Diuretics cause the kidneys to remove salt and water from the blood. The extra fluid is removed through urination, leaving a lower volume of blood that the heart has to pump. °· Beta blockers. These improve heart  muscle strength and they prevent the heart from beating too quickly. °· Digoxin. This increases the force of the heartbeat. ° °Healthy behavior changes °These may include: °· Reaching and maintaining a healthy weight. °· Stopping smoking or chewing tobacco. °· Eating heart-healthy foods. °· Limiting or avoiding alcohol. °· Stopping use of street drugs (illegal drugs). °· Physical activity. ° °Other treatments °These may include: °· Surgery to open blocked coronary arteries or repair damaged heart valves. °· Placement of a biventricular pacemaker to improve heart muscle function (cardiac resynchronization therapy). This device paces both the right ventricle and left ventricle. °· Placement of a device to treat serious abnormal heart rhythms (implantable cardioverter defibrillator, or ICD). °· Placement of a device to improve the pumping ability of the heart (left ventricular assist device, or LVAD). °· Heart transplant. This can cure heart failure, and it is considered for certain patients who do not improve with other therapies. ° °Follow these instructions at home: °Medicines °· Take over-the-counter and prescription medicines only as told by your health care provider. Medicines are important in reducing the workload of your heart, slowing the progression of heart failure, and improving your symptoms. °? Do not stop taking your medicine unless your health care provider told you to do that. °? Do not skip any dose of medicine. °? Refill your prescriptions before you run out of medicine. You need your medicines every day. °Eating and drinking ° °· Eat heart-healthy foods. Talk with a dietitian to make an eating plan that is right for you. °? Choose foods that contain no trans fat and are low in saturated fat and cholesterol. Healthy choices include fresh or frozen fruits and vegetables, fish, lean meats, legumes, fat-free or low-fat dairy products, and whole-grain or high-fiber foods. °? Limit salt (sodium) if  directed by your health care provider. Sodium restriction may reduce symptoms of heart failure. Ask a dietitian to recommend heart-healthy seasonings. °? Use healthy cooking methods instead of frying. Healthy methods include roasting, grilling, broiling, baking, poaching, steaming, and stir-frying. °· Limit your fluid intake if directed by your health care provider. Fluid restriction may reduce symptoms of heart failure. °Lifestyle °· Stop smoking or using chewing tobacco. Nicotine and tobacco can damage your heart and your blood vessels. Do not use nicotine gum or patches before talking to your health care provider. °· Limit alcohol intake to no more than 1 drink per day for non-pregnant women and 2 drinks per day for men. One drink equals 12 oz of beer, 5 oz of wine, or 1½ oz of hard liquor. °? Drinking more than that is harmful to your heart. Tell your health care provider if you drink alcohol several times a week. °? Talk with your health care provider about whether any level of alcohol use is safe for you. °? If your heart has already been damaged by alcohol or you have severe heart failure, drinking alcohol should be stopped completely. °· Stop use of illegal drugs. °· Lose weight if directed by your health care provider. Weight loss may reduce symptoms of heart failure. °· Do moderate physical activity if directed by your health care provider. People who are elderly and people with severe heart failure should consult with a health care provider for physical activity recommendations. °  Monitor important information °· Weigh yourself every day. Keeping track of your weight daily helps you to notice excess fluid sooner. °? Weigh yourself every morning after you urinate and before you eat breakfast. °? Wear the same amount of clothing each time you weigh yourself. °? Record your daily weight. Provide your health care provider with your weight record. °· Monitor and record your blood pressure as told by your health  care provider. °· Check your pulse as told by your health care provider. °Dealing with extreme temperatures °· If the weather is extremely hot: °? Avoid vigorous physical activity. °? Use air conditioning or fans or seek a cooler location. °? Avoid caffeine and alcohol. °? Wear loose-fitting, lightweight, and light-colored clothing. °· If the weather is extremely cold: °? Avoid vigorous physical activity. °? Layer your clothes. °? Wear mittens or gloves, a hat, and a scarf when you go outside. °? Avoid alcohol. °General instructions °· Manage other health conditions such as hypertension, diabetes, thyroid disease, or abnormal heart rhythms as told by your health care provider. °· Learn to manage stress. If you need help to do this, ask your health care provider. °· Plan rest periods when fatigued. °· Get ongoing education and support as needed. °· Participate in or seek rehabilitation as needed to maintain or improve independence and quality of life. °· Stay up to date with immunizations. Keeping current on pneumococcal and influenza immunizations is especially important to prevent respiratory infections. °· Keep all follow-up visits as told by your health care provider. This is important. °Contact a health care provider if: °· You have a rapid weight gain. °· You have increasing shortness of breath that is unusual for you. °· You are unable to participate in your usual physical activities. °· You tire easily. °· You cough more than normal, especially with physical activity. °· You have any swelling or more swelling in areas such as your hands, feet, ankles, or abdomen. °· You are unable to sleep because it is hard to breathe. °· You feel like your heart is beating quickly (palpitations). °· You become dizzy or light-headed when you stand up. °Get help right away if: °· You have difficulty breathing. °· You notice or your family notices a change in your awareness, such as having trouble staying awake or having  difficulty with concentration. °· You have pain or discomfort in your chest. °· You have an episode of fainting (syncope). °This information is not intended to replace advice given to you by your health care provider. Make sure you discuss any questions you have with your health care provider. °Document Released: 04/16/2005 Document Revised: 12/20/2015 Document Reviewed: 11/09/2015 °Elsevier Interactive Patient Education © 2017 [employer] ° °

## 2016-03-21 ENCOUNTER — Other Ambulatory Visit: Payer: Self-pay | Admitting: Internal Medicine

## 2016-03-26 NOTE — Telephone Encounter (Signed)
rx faxed to [facility].  

## 2016-03-27 ENCOUNTER — Telehealth: Payer: Self-pay | Admitting: Internal Medicine

## 2016-03-27 ENCOUNTER — Other Ambulatory Visit: Payer: Self-pay | Admitting: Internal Medicine

## 2016-03-27 DIAGNOSIS — I10 Essential (primary) hypertension: Secondary | ICD-10-CM

## 2016-03-27 MED ORDER — POTASSIUM CHLORIDE CRYS ER 20 MEQ PO TBCR: 20.0000 meq | EXTENDED_RELEASE_TABLET | Freq: Every day | ORAL | 3 refills | Status: DC

## 2016-03-27 NOTE — Telephone Encounter (Signed)
Please advise 

## 2016-03-27 NOTE — Telephone Encounter (Signed)
Requesting potassium chloride SA (K-DUR,KLOR-CON) 20 MEQ tablet [id number]   be sent to the pharmacy on file.

## 2016-03-30 ENCOUNTER — Telehealth: Payer: Self-pay | Admitting: Internal Medicine

## 2016-03-30 ENCOUNTER — Other Ambulatory Visit: Payer: Self-pay | Admitting: Internal Medicine

## 2016-03-30 MED ORDER — POLYETHYLENE GLYCOL 3350 17 G PO PACK: 17.0000 g | PACK | Freq: Every day | ORAL | 5 refills | Status: DC

## 2016-03-30 NOTE — Telephone Encounter (Signed)
Russell [name] is calling for the following   Needing refills on  divalproex (DEPAKOTE ER) 250 MG 24 hr tablet [id number]  miralax  She also wanted to note that the patient has been gaining weight-  On 03/24/2016- 143.5 03/30/2016 - 148.1

## 2016-03-30 NOTE — Telephone Encounter (Signed)
Please advise on the weight gain.   Called [facility]osp[facility] and left message for Vicky.   Depakote refills are at the phamacy. ERx for miralax sent.

## 2016-03-30 NOTE — Telephone Encounter (Signed)
Contacted Vicky and informed of MD response.

## 2016-03-30 NOTE — Telephone Encounter (Signed)
Would recommend to double torsemide to 40 mg daily for 3-4 days then return to 20 mg daily. Call if no change.

## 2016-03-31 ENCOUNTER — Other Ambulatory Visit: Payer: Self-pay | Admitting: Internal Medicine

## 2016-03-31 DIAGNOSIS — F339 Major depressive disorder, recurrent, unspecified: Secondary | ICD-10-CM

## 2016-04-04 ENCOUNTER — Telehealth: Payer: Self-pay | Admitting: *Deleted

## 2016-04-04 ENCOUNTER — Other Ambulatory Visit: Payer: Self-pay | Admitting: *Deleted

## 2016-04-04 MED ORDER — ONETOUCH DELICA LANCETS 33G MISC: 5 refills | Status: DC

## 2016-04-04 MED ORDER — GLUCOSE BLOOD VI STRP: 1.0000 | ORAL_STRIP | Freq: Three times a day (TID) | 5 refills | Status: DC

## 2016-04-04 NOTE — Telephone Encounter (Signed)
Rec'd call daughter states MD gave BS monitor one touch verio to check dad BS. Needing rx for strips sent to his pharmacy. Inform daughter will send to [facility]...Russell [name]

## 2016-04-04 NOTE — Addendum Note (Signed)
Addended by: Earnstine Regal on: 04/04/2016 10:08 AM   Modules accepted: Orders

## 2016-04-17 ENCOUNTER — Other Ambulatory Visit: Payer: Self-pay | Admitting: Internal Medicine

## 2016-04-17 ENCOUNTER — Ambulatory Visit (INDEPENDENT_AMBULATORY_CARE_PROVIDER_SITE_OTHER): Payer: PPO | Admitting: Podiatry

## 2016-04-17 DIAGNOSIS — E0842 Diabetes mellitus due to underlying condition with diabetic polyneuropathy: Secondary | ICD-10-CM

## 2016-04-17 DIAGNOSIS — M2041 Other hammer toe(s) (acquired), right foot: Secondary | ICD-10-CM | POA: Diagnosis not present

## 2016-04-17 DIAGNOSIS — M2042 Other hammer toe(s) (acquired), left foot: Secondary | ICD-10-CM

## 2016-04-17 NOTE — Progress Notes (Signed)
Patient ID: Russell [name], male   DOB: Jan 10, 1924, 82 y.o.   MRN: [id number]  Subjective: As patient presents today with family members present for dispensing of diabetic shoes   Objective: Patient appears orientated 3 DP and PT pulses 2/4 bilaterally Sensation to 10 g monofilament wire intact 0/5 bilaterally Vibratory sensation nonreactive bilaterally Ankle reflexes weakly reactive bilaterally Hammertoe second bilaterally Slow gait requiring cane No open skin lesions noted bilaterally Mild distal keratoses hallux bilaterally The toenails are elongated, brittle, deformed, hypertrophic  Hush puppies 11 wide  [id number] fit satisfactorily 2 Custom molded insoles fit satisfactorily 6  Assessment: Type II diabetic with neuropathy Hammertoe second bilaterally  Plan: Diabetic shoes 2 and custom insoles 6 dispensed The insoles were adjusted to fit in shoes satisfactorily Written wearing instruction provided  Reappoint for previously scheduled skin a nail debridement or sooner if patient has concern

## 2016-04-17 NOTE — Patient Instructions (Signed)
Back shoes dispensed wears instructed  Diabetes and Foot Care Diabetes may cause you to have problems because of poor blood supply (circulation) to your feet and legs. This may cause the skin on your feet to become thinner, break easier, and heal more slowly. Your skin may become dry, and the skin may peel and crack. You may also have nerve damage in your legs and feet causing decreased feeling in them. You may not notice minor injuries to your feet that could lead to infections or more serious problems. Taking care of your feet is one of the most important things you can do for yourself. Follow these instructions at home:  Wear shoes at all times, even in the house. Do not go barefoot. Bare feet are easily injured.  Check your feet daily for blisters, cuts, and redness. If you cannot see the bottom of your feet, use a mirror or ask someone for help.  Wash your feet with warm water (do not use hot water) and mild soap. Then pat your feet and the areas between your toes until they are completely dry. Do not soak your feet as this can dry your skin.  Apply a moisturizing lotion or petroleum jelly (that does not contain alcohol and is unscented) to the skin on your feet and to dry, brittle toenails. Do not apply lotion between your toes.  Trim your toenails straight across. Do not dig under them or around the cuticle. File the edges of your nails with an emery board or nail file.  Do not cut corns or calluses or try to remove them with medicine.  Wear clean socks or stockings every day. Make sure they are not too tight. Do not wear knee-high stockings since they may decrease blood flow to your legs.  Wear shoes that fit properly and have enough cushioning. To break in new shoes, wear them for just a few hours a day. This prevents you from injuring your feet. Always look in your shoes before you put them on to be sure there are no objects inside.  Do not cross your legs. This may decrease the blood  flow to your feet.  If you find a minor scrape, cut, or break in the skin on your feet, keep it and the skin around it clean and dry. These areas may be cleansed with mild soap and water. Do not cleanse the area with peroxide, alcohol, or iodine.  When you remove an adhesive bandage, be sure not to damage the skin around it.  If you have a wound, look at it several times a day to make sure it is healing.  Do not use heating pads or hot water bottles. They may burn your skin. If you have lost feeling in your feet or legs, you may not know it is happening until it is too late.  Make sure your health care provider performs a complete foot exam at least annually or more often if you have foot problems. Report any cuts, sores, or bruises to your health care provider immediately. Contact a health care provider if:  You have an injury that is not healing.  You have cuts or breaks in the skin.  You have an ingrown nail.  You notice redness on your legs or feet.  You feel burning or tingling in your legs or feet.  You have pain or cramps in your legs and feet.  Your legs or feet are numb.  Your feet always feel cold. Get help right  away if:  There is increasing redness, swelling, or pain in or around a wound.  There is a red line that goes up your leg.  Pus is coming from a wound.  You develop a fever or as directed by your health care provider.  You notice a bad smell coming from an ulcer or wound. This information is not intended to replace advice given to you by your health care provider. Make sure you discuss any questions you have with your health care provider. Document Released: 04/13/2000 Document Revised: 09/22/2015 Document Reviewed: 09/23/2012 Elsevier Interactive Patient Education  2017 [employer].

## 2016-04-20 ENCOUNTER — Telehealth: Payer: Self-pay | Admitting: Emergency Medicine

## 2016-04-20 NOTE — Telephone Encounter (Signed)
Received call from Hospice nurse to give an update on pt.   Pt had a fall on 04/16/16 , no injuries after fall, minimal swelling to right lower leg. States today during visit his legs looked great.   Concerned about weight gain, increase of 5 lbs in a week. Pt is now weighing 04/13/16 154lbs with no symptoms, 04/20/16 weighing 148lbs. BP is trending up to 138/52 typically runs 106/60 ish.   03/30/16 if pt becomes symptomatic increase torsemide to 40 mg daily for 3-4 days.   Please contact hospice with any changes that may need to be made to pts care.   CB [id number] Loletha Carrow, [place] office--  [phone]

## 2016-04-20 NOTE — Telephone Encounter (Signed)
error 

## 2016-05-03 ENCOUNTER — Other Ambulatory Visit: Payer: Self-pay | Admitting: Internal Medicine

## 2016-05-03 ENCOUNTER — Ambulatory Visit: Payer: Self-pay | Admitting: Internal Medicine

## 2016-05-08 ENCOUNTER — Ambulatory Visit: Payer: Self-pay | Admitting: Internal Medicine

## 2016-05-09 ENCOUNTER — Ambulatory Visit: Payer: PPO | Admitting: Internal Medicine

## 2016-05-16 ENCOUNTER — Ambulatory Visit: Payer: PPO | Admitting: Internal Medicine

## 2016-05-22 ENCOUNTER — Ambulatory Visit (INDEPENDENT_AMBULATORY_CARE_PROVIDER_SITE_OTHER): Payer: PPO | Admitting: Internal Medicine

## 2016-05-22 VITALS — BP 120/60 | HR 55 | Temp 97.5°F | Ht 67.0 in | Wt 151.0 lb

## 2016-05-22 DIAGNOSIS — K4091 Unilateral inguinal hernia, without obstruction or gangrene, recurrent: Secondary | ICD-10-CM | POA: Insufficient documentation

## 2016-05-22 DIAGNOSIS — M150 Primary generalized (osteo)arthritis: Secondary | ICD-10-CM | POA: Diagnosis not present

## 2016-05-22 DIAGNOSIS — M159 Polyosteoarthritis, unspecified: Secondary | ICD-10-CM

## 2016-05-22 MED ORDER — HYDROCODONE-ACETAMINOPHEN 5-325 MG PO TABS: 1.0000 | ORAL_TABLET | Freq: Four times a day (QID) | ORAL | 0 refills | Status: DC | PRN

## 2016-05-22 NOTE — Patient Instructions (Signed)
Inguinal Hernia, Adult Introduction An inguinal hernia is when fat or the intestines push through the area where the leg meets the lower abdomen (groin) and create a rounded lump (bulge). This condition develops over time. There are three types of inguinal hernias. These types include:  Hernias that can be pushed back into the belly (are reducible).  Hernias that are not reducible (are incarcerated).  Hernias that are not reducible and lose their blood supply (are strangulated). This type of hernia requires emergency surgery. What are the causes? This condition is caused by having a weak spot in the muscles or tissue. This weakness lets the hernia poke through. This condition can be triggered by:  Suddenly straining the muscles of the lower abdomen.  Lifting heavy objects.  Straining to have a bowel movement. Difficult bowel movements (constipation) can lead to this.  Coughing. What increases the risk? This condition is more likely to develop in:  Men.  Pregnant women.  People who:  Are overweight.  Work in jobs that require long periods of standing or heavy lifting.  Have had an inguinal hernia before.  Smoke or have lung disease. These factors can lead to long-lasting (chronic) coughing. What are the signs or symptoms? Symptoms can depend on the size of the hernia. Often, a small inguinal hernia has no symptoms. Symptoms of a larger hernia include:  A lump in the groin. This is easier to see when the person is standing. It might not be visible when he or she is lying down.  Pain or burning in the groin. This occurs especially when lifting, straining, or coughing.  A dull ache or a feeling of pressure in the groin.  A lump in the scrotum in men. Symptoms of a strangulated inguinal hernia can include:  A bulge in the groin that is very painful and tender to the touch.  A bulge that turns red or purple.  Fever, nausea, and vomiting.  The inability to have a bowel  movement or to pass gas. How is this diagnosed? This condition is diagnosed with a medical history and physical exam. Your health care provider may feel your groin area and ask you to cough. How is this treated? Treatment for this condition varies depending on the size of your hernia and whether you have symptoms. If you do not have symptoms, your health care provider may have you watch your hernia carefully and come in for follow-up visits. If your hernia is larger or if you have symptoms, your treatment will include surgery. Follow these instructions at home: Lifestyle  Drink enough fluid to keep your urine clear or pale yellow.  Eat a diet that includes a lot of fiber. Eat plenty of fruits, vegetables, and whole grains. Talk with your health care provider if you have questions.  Avoid lifting heavy objects.  Avoid standing for long periods of time.  Do not use tobacco products, including cigarettes, chewing tobacco, or e-cigarettes. If you need help quitting, ask your health care provider.  Maintain a healthy weight. General instructions  Do not try to force the hernia back in.  Watch your hernia for any changes in color or size. Let your health care provider know if any changes occur.  Take over-the-counter and prescription medicines only as told by your health care provider.  Keep all follow-up visits as told by your health care provider. This is important. Contact a health care provider if:  You have a fever.  You have new symptoms.  Your symptoms  get worse. Get help right away if:  You have pain in the groin that suddenly gets worse.  A bulge in the groin gets bigger suddenly and does not go down.  You are a man and you have a sudden pain in the scrotum, or the size of your scrotum suddenly changes.  A bulge in the groin area becomes red or purple and is painful to the touch.  You have nausea or vomiting that does not go away.  You feel your heart beating a lot  more quickly than normal.  You cannot have a bowel movement or pass gas. This information is not intended to replace advice given to you by your health care provider. Make sure you discuss any questions you have with your health care provider. Document Released: 09/02/2008 Document Revised: 09/22/2015 Document Reviewed: 02/24/2014  2017 Elsevier

## 2016-05-22 NOTE — Progress Notes (Signed)
Pre visit review using our clinic review tool, if applicable. No additional management support is needed unless otherwise documented below in the visit note. 

## 2016-05-22 NOTE — Progress Notes (Signed)
Subjective:  Patient ID: Russell [name], male    DOB: [birth year]  Age: 83 y.o. MRN: [id number]  CC: Groin Swelling   HPI Xzavier Neitzke presents for A one-week history of swelling in his groin and testicular discomfort.  Outpatient Medications Prior to Visit  Medication Sig Dispense Refill  . ALPRAZolam (XANAX) 0.5 MG tablet TAKE 1 TABLET BY MOUTH THREE TIMES DAILY AS NEEDED FOR ANXIETY 60 tablet 5  . aspirin 81 MG tablet Take 81 mg by mouth daily.      . Azelastine-Fluticasone 137-50 MCG/ACT SUSP Place 1 spray into the nose 2 (two) times daily as needed.    . carvedilol (COREG) 3.125 MG tablet TAKE 1 TABLET(3.125 MG) BY MOUTH TWICE DAILY WITH A MEAL 60 tablet 11  . Cyanocobalamin (VITAMIN B-12 PO) Take 1 tablet by mouth daily with breakfast.    . divalproex (DEPAKOTE ER) 250 MG 24 hr tablet Take 1 tablet (250 mg total) by mouth daily. 30 tablet 11  . dronabinol (MARINOL) 2.5 MG capsule Take 1 capsule (2.5 mg total) by mouth 2 (two) times daily before a meal. 60 capsule 3  . fesoterodine (TOVIAZ) 8 MG TB24 tablet Take 1 tablet (8 mg total) by mouth daily. 30 tablet 11  . isosorbide-hydrALAZINE (BIDIL) 20-37.5 MG tablet Take 1 tablet by mouth 3 (three) times daily. 90 tablet 11  . ketoconazole (NIZORAL) 2 % cream Apply 1 application topically 2 (two) times daily. 60 g 2  . ONETOUCH DELICA LANCETS 99991111 MISC Use to test blood sugar 1-2 times per day. Dx E11.9 100 each 5  . polyethylene glycol (MIRALAX / GLYCOLAX) packet Take 17 g by mouth daily. 14 each 5  . potassium chloride SA (K-DUR,KLOR-CON) 20 MEQ tablet Take 1 tablet (20 mEq total) by mouth daily. 90 tablet 3  . sertraline (ZOLOFT) 100 MG tablet Take 100 mg by mouth daily.    . sertraline (ZOLOFT) 100 MG tablet TAKE 1 TABLET BY MOUTH DAILY 90 tablet 3  . simvastatin (ZOCOR) 80 MG tablet Take 40 mg by mouth daily at 6 PM.   3  . simvastatin (ZOCOR) 80 MG tablet TAKE 1/2 TABLET BY MOUTH EVERY EVENING 90 tablet 1  . torsemide (DEMADEX) 20  MG tablet Take 1 tablet (20 mg total) by mouth daily. 90 tablet 1  . glucose blood (ONETOUCH VERIO) test strip 1 each by Other route 3 (three) times daily. Use to check blood sugars three times a day Dx E11.9 100 each 5  . HYDROcodone-acetaminophen (NORCO/VICODIN) 5-325 MG tablet Take 1 tablet by mouth every 6 (six) hours as needed for moderate pain. 90 tablet 0  . promethazine-dextromethorphan (PROMETHAZINE-DM) 6.25-15 MG/5ML syrup Take 5 mLs by mouth 4 (four) times daily as needed for cough. 240 mL 1   No facility-administered medications prior to visit.     ROS Review of Systems  Constitutional: Negative for chills, fatigue and fever.  HENT: Negative.   Eyes: Negative for visual disturbance.  Respiratory: Negative for cough, chest tightness, shortness of breath, wheezing and stridor.   Cardiovascular: Negative for chest pain, palpitations and leg swelling.  Gastrointestinal: Negative for abdominal pain, diarrhea, nausea and vomiting.  Endocrine: Negative.   Genitourinary: Positive for scrotal swelling and testicular pain. Negative for decreased urine volume, difficulty urinating, discharge, dysuria, frequency, hematuria, penile pain, penile swelling and urgency.  Musculoskeletal: Positive for arthralgias. Negative for back pain.  Skin: Negative.   Allergic/Immunologic: Negative.   Neurological: Negative.   Hematological: Negative for adenopathy. Does  not bruise/bleed easily.  Psychiatric/Behavioral: Negative.     Objective:  BP 120/60 (BP Location: Left Arm, Patient Position: Sitting, Cuff Size: Normal)   Pulse (!) 55   Temp 97.5 F (36.4 C) (Oral)   Ht 5\' 7"  (1.702 m)   Wt 151 lb 0.6 oz (68.5 kg)   SpO2 98%   BMI 23.66 kg/m   BP Readings from Last 3 Encounters:  05/22/16 120/60  03/19/16 (!) 140/58  03/06/16 (!) 132/58    Wt Readings from Last 3 Encounters:  05/22/16 151 lb 0.6 oz (68.5 kg)  03/19/16 145 lb 8 oz (66 kg)  02/29/16 147 lb 5 oz (66.8 kg)    Physical  Exam  Constitutional: He is oriented to person, place, and time. No distress.  HENT:  Mouth/Throat: Oropharynx is clear and moist. No oropharyngeal exudate.  Eyes: Conjunctivae are normal. Right eye exhibits no discharge. Left eye exhibits no discharge. No scleral icterus.  Neck: Normal range of motion. Neck supple. No JVD present. No tracheal deviation present. No thyromegaly present.  Cardiovascular: Normal rate, regular rhythm, normal heart sounds and intact distal pulses.  Exam reveals no gallop and no friction rub.   No murmur heard. Pulmonary/Chest: Effort normal and breath sounds normal. No stridor. No respiratory distress. He has no wheezes. He has no rales. He exhibits no tenderness.  Abdominal: Soft. Bowel sounds are normal. He exhibits no distension and no mass. There is no tenderness. There is no rebound and no guarding. A hernia is present. Hernia confirmed positive in the right inguinal area and confirmed positive in the left inguinal area.  Genitourinary: Penis normal. Right testis shows swelling. Right testis shows no mass and no tenderness. Right testis is descended. Cremasteric reflex is not absent on the right side. Left testis shows no mass, no swelling and no tenderness. Left testis is descended. Cremasteric reflex is not absent on the left side. Uncircumcised. No phimosis, paraphimosis, hypospadias, penile erythema or penile tenderness. No discharge found.  Genitourinary Comments: On the right there is a direct inguinal hernia that extends into the right scrotum, it is not reducible  On the left there is a much smaller direct hernia, it does not extend into the scrotum and is not reducible  Musculoskeletal: Normal range of motion. He exhibits no edema, tenderness or deformity.  Lymphadenopathy:    He has no cervical adenopathy.       Right: No inguinal adenopathy present.       Left: No inguinal adenopathy present.  Neurological: He is oriented to person, place, and time.    Skin: Skin is warm and dry. No rash noted. He is not diaphoretic. No erythema. No pallor.  Vitals reviewed.   Lab Results  Component Value Date   WBC 4.2 01/12/2016   HGB 13.1 01/12/2016   HCT 39.3 01/12/2016   PLT 213.0 01/12/2016   GLUCOSE 73 01/30/2016   CHOL 124 01/25/2015   TRIG 74.0 01/25/2015   HDL 46.50 01/25/2015   LDLCALC 63 01/25/2015   ALT 29 12/31/2015   AST 52 (H) 12/31/2015   NA 143 01/30/2016   K 4.6 01/30/2016   CL 103 01/30/2016   CREATININE 1.39 (H) 01/30/2016   BUN 27 (H) 01/30/2016   CO2 28 01/30/2016   TSH 1.86 01/25/2015   PSA 1.94 02/20/2013   INR 1.22 12/17/2015   HGBA1C 5.4 12/17/2015   MICROALBUR 2.2 (H) 01/25/2015    Dg Chest 2 View  Result Date: 01/12/2016 CLINICAL DATA:  Follow-up  pneumonia. EXAM: CHEST  2 VIEW COMPARISON:  01/01/2016 FINDINGS: There is no focal parenchymal opacity. There are trace bilateral pleural effusions. There is no pneumothorax. There is stable cardiomegaly. The osseous structures are unremarkable. IMPRESSION: Trace bilateral pleural effusions. Electronically Signed   By: Kathreen Devoid   On: 01/12/2016 16:24    Assessment & Plan:   Khelan was seen today for groin swelling.  Diagnoses and all orders for this visit:  Recurrent inguinal hernia of right side without obstruction or gangrene -     Ambulatory referral to General Surgery  Primary osteoarthritis involving multiple joints -     HYDROcodone-acetaminophen (NORCO/VICODIN) 5-325 MG tablet; Take 1 tablet by mouth every 6 (six) hours as needed for moderate pain.   I have discontinued Mr. Cerros promethazine-dextromethorphan and glucose blood. I am also having him maintain his aspirin, fesoterodine, simvastatin, Cyanocobalamin (VITAMIN B-12 PO), Azelastine-Fluticasone, sertraline, torsemide, dronabinol, ketoconazole, isosorbide-hydrALAZINE, divalproex, ALPRAZolam, potassium chloride SA, polyethylene glycol, sertraline, ONETOUCH DELICA LANCETS 99991111, carvedilol,  simvastatin, and HYDROcodone-acetaminophen.  Meds ordered this encounter  Medications  . HYDROcodone-acetaminophen (NORCO/VICODIN) 5-325 MG tablet    Sig: Take 1 tablet by mouth every 6 (six) hours as needed for moderate pain.    Dispense:  90 tablet    Refill:  0     Follow-up: No Follow-up on file.  Scarlette Calico, MD

## 2016-05-23 ENCOUNTER — Encounter: Payer: Self-pay | Admitting: Internal Medicine

## 2016-05-29 ENCOUNTER — Telehealth: Payer: Self-pay | Admitting: Emergency Medicine

## 2016-05-29 DIAGNOSIS — I5022 Chronic systolic (congestive) heart failure: Secondary | ICD-10-CM

## 2016-05-29 NOTE — Telephone Encounter (Signed)
Hospice called and stated patient is out of his prescription for isosorbide-hydrALAZINE (BIDIL) 20-37.5 MG tablet. [facility] Thanks.

## 2016-05-30 MED ORDER — ISOSORB DINITRATE-HYDRALAZINE 20-37.5 MG PO TABS: 1.0000 | ORAL_TABLET | Freq: Three times a day (TID) | ORAL | 3 refills | Status: DC

## 2016-05-30 NOTE — Telephone Encounter (Signed)
Left detailed message that rx was sent to pharmacy as requested. (rx was generic Bidil).

## 2016-06-12 ENCOUNTER — Encounter: Payer: Self-pay | Admitting: Podiatry

## 2016-06-12 ENCOUNTER — Ambulatory Visit: Payer: Self-pay | Admitting: Surgery

## 2016-06-12 ENCOUNTER — Ambulatory Visit (INDEPENDENT_AMBULATORY_CARE_PROVIDER_SITE_OTHER): Payer: PPO | Admitting: Podiatry

## 2016-06-12 DIAGNOSIS — E0842 Diabetes mellitus due to underlying condition with diabetic polyneuropathy: Secondary | ICD-10-CM | POA: Diagnosis not present

## 2016-06-12 DIAGNOSIS — B351 Tinea unguium: Secondary | ICD-10-CM | POA: Diagnosis not present

## 2016-06-12 DIAGNOSIS — M79674 Pain in right toe(s): Secondary | ICD-10-CM | POA: Diagnosis not present

## 2016-06-12 DIAGNOSIS — M79675 Pain in left toe(s): Secondary | ICD-10-CM

## 2016-06-12 DIAGNOSIS — K4090 Unilateral inguinal hernia, without obstruction or gangrene, not specified as recurrent: Secondary | ICD-10-CM | POA: Diagnosis not present

## 2016-06-12 NOTE — H&P (Signed)
History of Present Illness Russell [name]. Russell Kelnhofer MD; 06/12/2016 12:01 PM) The patient is a 83 year old male who presents with an inguinal hernia. Referred by Dewayne Shorter PA-C for right inguinal hernia  This is a 83 year old male who presents with a six-week history of a small palpable mass in his right groin. This area remains reducible. It has become slightly larger and a little bit more uncomfortable. He has a history of a left inguinal hernia repair when he was age 45. He has also had a previous vasectomy. The patient has lost about 80 pounds over the last year and a half and has been participating in a fairly vigorous exercise regimen. He does not remember any single event that precipitated the inguinal pain. He has continued to work out without any difficulties. The pain improves when he is supine. He denies any obstructive symptoms.   Past Surgical History (April Staton, CMA; 06/12/2016 10:58 AM) Open Inguinal Hernia Surgery Left. Vasectomy  Diagnostic Studies History (April Staton, [place]; 06/12/2016 10:58 AM) Colonoscopy never  Allergies (April Staton, CMA; 06/12/2016 10:59 AM) No Known Drug Allergies 06/12/2016  Medication History (April Staton, CMA; 06/12/2016 11:02 AM) Aspirin (81MG  Tablet Chewable, Oral) Active. Atorvastatin Calcium (10MG  Tablet, Oral) Active. Cyanocobalamin (100MCG Tablet, Oral) Active. Levothyroxine Sodium (175MCG Tablet, Oral) Active. Lisinopril-Hydrochlorothiazide (10-12.5MG  Tablet, Oral) Active. Medications Reconciled  Social History (April Staton, CMA; 06/12/2016 10:58 AM) Alcohol use Moderate alcohol use. Caffeine use Carbonated beverages. No drug use Tobacco use Never smoker.  Family History (April Staton, [place]; 06/12/2016 10:58 AM) Arthritis Family Members In General. Cerebrovascular Accident Family Members In General. Diabetes Mellitus Brother. Heart Disease Father. Hypertension Father. Respiratory Condition  Mother.  Other Problems (April Staton, CMA; 06/12/2016 10:58 AM) Heart murmur Hypercholesterolemia Inguinal Hernia Sleep Apnea Thyroid Disease     Review of Systems (April Staton CMA; 06/12/2016 10:58 AM) General Not Present- Appetite Loss, Chills, Fatigue, Fever, Night Sweats, Weight Gain and Weight Loss. Skin Not Present- Change in Wart/Mole, Dryness, Hives, Jaundice, New Lesions, Non-Healing Wounds, Rash and Ulcer. HEENT Not Present- Earache, Hearing Loss, Hoarseness, Nose Bleed, Oral Ulcers, Ringing in the Ears, Seasonal Allergies, Sinus Pain, Sore Throat, Visual Disturbances, Wears glasses/contact lenses and Yellow Eyes. Respiratory Not Present- Bloody sputum, Chronic Cough, Difficulty Breathing, Snoring and Wheezing. Breast Not Present- Breast Mass, Breast Pain, Nipple Discharge and Skin Changes. Cardiovascular Not Present- Chest Pain, Difficulty Breathing Lying Down, Leg Cramps, Palpitations, Rapid Heart Rate, Shortness of Breath and Swelling of Extremities. Gastrointestinal Not Present- Abdominal Pain, Bloating, Bloody Stool, Change in Bowel Habits, Chronic diarrhea, Constipation, Difficulty Swallowing, Excessive gas, Gets full quickly at meals, Hemorrhoids, Indigestion, Nausea, Rectal Pain and Vomiting. Male Genitourinary Not Present- Blood in Urine, Change in Urinary Stream, Frequency, Impotence, Nocturia, Painful Urination, Urgency and Urine Leakage.  Vitals (April Staton CMA; 06/12/2016 11:02 AM) 06/12/2016 11:02 AM Weight: 214.13 lb Height: 69in Body Surface Area: 2.13 m Body Mass Index: 31.62 kg/m  Temp.: 98.50F(Oral)  Pulse: 74 (Regular)  BP: 130/84 (Sitting, Left Arm, Standard)      Physical Exam Russell Key K. Macarena Langseth MD; 06/12/2016 12:01 PM)  The physical exam findings are as follows: Note:WDWN in NAD Eyes: Pupils equal, round; sclera anicteric HENT: Oral mucosa moist; good dentition Neck: No masses palpated, no thyromegaly Lungs: CTA bilaterally;  normal respiratory effort CV: Regular rate and rhythm; no murmurs; extremities well-perfused with no edema Abd: +bowel sounds, soft, non-tender, no palpable organomegaly; palpable right inguinal hernia with Valsalva maneuver - spontaneously reducible Skin: Warm, dry; no sign  of jaundice Psychiatric - alert and oriented x 4; calm mood and affect    Assessment & Plan Russell Key K. Ledarius Leeson MD; 06/12/2016 11:20 AM)  REDUCIBLE RIGHT INGUINAL HERNIA (K40.90)  Current Plans Schedule for Surgery - Right inguinal hernia repair with mesh. The surgical procedure has been discussed with the patient. Potential risks, benefits, alternative treatments, and expected outcomes have been explained. All of the patient's questions at this time have been answered. The likelihood of reaching the patient's treatment goal is good. The patient understand the proposed surgical procedure and wishes to proceed. Pt Education - Pamphlet Given - Hernia Surgery: discussed with patient and provided information.  Russell [name]. Georgette Dover, MD, [facility] Surgery  General/ Trauma Surgery  06/12/2016 12:19 PM

## 2016-06-12 NOTE — Patient Instructions (Signed)

## 2016-06-12 NOTE — Progress Notes (Signed)
Patient ID: Russell [name], male   DOB: [birth year], 83 y.o.   MRN: [id number]   Subjective: This diabetic patient presents for a scheduled visit requesting debridement of mycotic toenails   Objective: Patient appears orientated 3 DP and PT pulses 2/4 bilaterally Sensation to 10 g monofilament wire intact 0/5 bilaterally Vibratory sensation nonreactive bilaterally Ankle reflexes weakly reactive bilaterally Hammertoe second bilaterally Slow gait requiring cane No open skin lesions noted bilaterally Atrophic dry skin bilaterally Mild distal keratoses hallux bilaterally The toenails are elongated, brittle, deformed, hypertrophic   Assessment: Diabetic neuropathy Mycotic toenails 6-10  Plan: Debridement of toenails 6-10 mechanically and electronically without any bleeding  Reappoint 3 months

## 2016-07-03 NOTE — Progress Notes (Signed)
Electrophysiology Office Note Date: 07/05/2016  ID:  Russell [name], DOB 01-13-1924, MRN [id number]  PCP: Scarlette Calico, MD Electrophysiologist: Caryl Comes  CC: surgical clearance  Russell [name] is a 83 y.o. male seen today for Dr Caryl Comes.  He is pending hernia surgery and presents today for surgical clearance.  Since last being seen in our clinic, the patient reports doing reasoanbly well.  He continues to live at home and function independently. He has a hernia that is causing significant discomfort for which he would like to have surgery.  He denies chest pain, palpitations, dyspnea, PND, orthopnea, nausea, vomiting, dizziness, syncope, edema, weight gain, or early satiety.  Echo 11/2015 demonstrated EF 20-25%, mild LVH  Past Medical History:  Diagnosis Date  . Anxiety associated with depression   . Chronic systolic CHF (congestive heart failure) ([place])    a. Echo 8/17: EF 20%, diff HK, ant-septal, ant-lateral inf severe HK, prominent trabeculation noted in LV apex, mod AI, trivial MR, trivial TR, small pericardial effusion // b. Limited Echo 8/17: Diff HK, inf and post AK, apical AK, mild LVH, EF 20-25%, mild AI, mild MR, mild LAE, mod to severe reduced RVSF, small pericardial effusion  . Colon cancer [facility])    s/p resection 12/06  . Dementia   . Diabetes mellitus    diet mgt (11`/12)  . FTT (failure to thrive)    admited 08/2008  . History of cystoscopy    uretheral stricutre, 2005- Dr.Humphreys  . Hyperlipidemia   . Hypertension   . Loss of hearing    being fitted for hearing aids (11/12)  . Osteoarthritis    hands  . Pneumonia   . Prostate cancer ([place])    s/p XRT  . SBO (small bowel obstruction) 03/08/2014  . Urolithiasis    Past Surgical History:  Procedure Laterality Date  . CATARACT EXTRACTION, BILATERAL    . HEMICOLECTOMY  12/06  . TRANSURETHRAL RESECTION OF PROSTATE    . xrt     ERT for prostate ca    Current Outpatient Prescriptions  Medication Sig Dispense Refill    . ALPRAZolam (XANAX) 0.5 MG tablet TAKE 1 TABLET BY MOUTH THREE TIMES DAILY AS NEEDED FOR ANXIETY 60 tablet 5  . aspirin 81 MG tablet Take 81 mg by mouth daily.      . Azelastine-Fluticasone 137-50 MCG/ACT SUSP Place 1 spray into the nose 2 (two) times daily as needed.    . carvedilol (COREG) 3.125 MG tablet TAKE 1 TABLET(3.125 MG) BY MOUTH TWICE DAILY WITH A MEAL 60 tablet 11  . Cyanocobalamin (VITAMIN B-12 PO) Take 1 tablet by mouth daily with breakfast.    . divalproex (DEPAKOTE ER) 250 MG 24 hr tablet Take 1 tablet (250 mg total) by mouth daily. 30 tablet 11  . fesoterodine (TOVIAZ) 8 MG TB24 tablet Take 1 tablet (8 mg total) by mouth daily. 30 tablet 11  . HYDROcodone-acetaminophen (NORCO/VICODIN) 5-325 MG tablet Take 1 tablet by mouth every 6 (six) hours as needed for moderate pain. 90 tablet 0  . isosorbide-hydrALAZINE (BIDIL) 20-37.5 MG tablet Take 1 tablet by mouth 3 (three) times daily. 90 tablet 3  . ketoconazole (NIZORAL) 2 % cream Apply 1 application topically 2 (two) times daily. 60 g 2  . ONETOUCH DELICA LANCETS 99991111 MISC Use to test blood sugar 1-2 times per day. Dx E11.9 100 each 5  . ONETOUCH VERIO test strip CHECK BLOOD SUGARS THREE TIMES DAILY 100 each 11  . polyethylene glycol (MIRALAX /  GLYCOLAX) packet Take 17 g by mouth daily. 14 each 5  . potassium chloride SA (K-DUR,KLOR-CON) 20 MEQ tablet Take 1 tablet (20 mEq total) by mouth daily. 90 tablet 3  . sertraline (ZOLOFT) 100 MG tablet Take 100 mg by mouth daily.    . sertraline (ZOLOFT) 100 MG tablet TAKE 1 TABLET BY MOUTH DAILY 90 tablet 3  . simvastatin (ZOCOR) 80 MG tablet Take 40 mg by mouth daily at 6 PM.   3  . simvastatin (ZOCOR) 80 MG tablet TAKE 1/2 TABLET BY MOUTH EVERY EVENING 90 tablet 1  . torsemide (DEMADEX) 20 MG tablet Take 1 tablet (20 mg total) by mouth daily. 90 tablet 1   No current facility-administered medications for this visit.     Allergies:   Patient has no known allergies.   Social  History: Social History   Social History  . Marital status: Widowed    Spouse name: N/A  . Number of children: N/A  . Years of education: N/A   Occupational History  . Not on file.   Social History Main Topics  . Smoking status: Former Smoker    Quit date: 03/20/1967  . Smokeless tobacco: Never Used  . Alcohol use No     Comment: quit '68  . Drug use: No  . Sexual activity: Not Currently   Other Topics Concern  . Not on file   Social History Narrative   7th grade education. Married '51 - 9/09. 3 daughters- '51, '53, '66; 1 son- '54. Lives w/ daughter Dub Mikes who has schizophrenia since age 68. 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 Diabetes Daughter   . Cancer Neg Hx   . Early death Neg Hx   . Heart disease Neg Hx   . Hyperlipidemia Neg Hx   . Hypertension Neg Hx   . Kidney disease Neg Hx   . Stroke Neg Hx   . Heart attack Neg Hx     Review of Systems: All other systems reviewed and are otherwise negative except as noted above.   Physical Exam: VS:  BP 124/60   Pulse 63   Ht 5\' 8"  (1.727 m)   Wt 144 lb (65.3 kg)   BMI 21.90 kg/m  , BMI Body mass index is 21.9 kg/m. Wt Readings from Last 3 Encounters:  07/05/16 144 lb (65.3 kg)  05/22/16 151 lb 0.6 oz (68.5 kg)  03/19/16 145 lb 8 oz (66 kg)    GEN- The patient is elderly and chronically ill appearing, alert and oriented x 3 today.   HEENT: normocephalic, atraumatic; sclera clear, conjunctiva pink; hearing intact; oropharynx clear; neck supple, poor dentition Lungs- Clear to ausculation bilaterally, normal work of breathing.  No wheezes, rales, rhonchi Heart- Regular rate and rhythm  GI- soft, non-tender, non-distended, bowel sounds present  Extremities- no clubbing, cyanosis, or edema  MS- no significant deformity or atrophy Skin- warm and dry, no rash or lesion  Psych- euthymic mood, full affect Neuro- strength and  sensation are intact   EKG:  EKG is ordered today. The ekg ordered today shows sinus rhythm, rate 63, 1st degree AV block, PR 277msec  Recent Labs: 12/31/2015: ALT 29; B Natriuretic Peptide 2,335.5 01/04/2016: Magnesium 1.9 01/12/2016: Hemoglobin 13.1; Platelets 213.0 01/30/2016: BUN 27; Creat 1.39; Potassium 4.6; Sodium 143    Other studies Reviewed: Additional studies/ records that  were reviewed today include: office notes, last echo   Assessment and Plan: 1.  Chronic systolic heart failure Euvolemic on exam Continue current therapy BMET today  2.  HTN Stable No change required today  3.  CKD BMET today   4.  Surgical clearance With advanced age and LV dysfunction, he is at high risk for any procedures. Would avoid surgery if able. If surgery is necessary, would continue HF medications in the peri-operative period and be mindful of fluid status. We are available as needed  Current medicines are reviewed at length with the patient today.   The patient does not have concerns regarding his medicines.  The following changes were made today:  none  Labs/ tests ordered today include: BMET No orders of the defined types were placed in this encounter.    Disposition:   Follow up with Dr Caryl Comes in 6 months      Signed, Chanetta [place], NP 07/05/2016 10:30 AM   [place] [address] [phone] (office) [phone] (fax)

## 2016-07-04 ENCOUNTER — Ambulatory Visit: Payer: Self-pay | Admitting: Internal Medicine

## 2016-07-05 ENCOUNTER — Other Ambulatory Visit: Payer: Self-pay | Admitting: Internal Medicine

## 2016-07-05 ENCOUNTER — Ambulatory Visit (INDEPENDENT_AMBULATORY_CARE_PROVIDER_SITE_OTHER): Payer: PPO | Admitting: Nurse Practitioner

## 2016-07-05 ENCOUNTER — Encounter: Payer: Self-pay | Admitting: Nurse Practitioner

## 2016-07-05 VITALS — BP 124/60 | HR 63 | Ht 68.0 in | Wt 144.0 lb

## 2016-07-05 DIAGNOSIS — I5022 Chronic systolic (congestive) heart failure: Secondary | ICD-10-CM | POA: Diagnosis not present

## 2016-07-05 DIAGNOSIS — I10 Essential (primary) hypertension: Secondary | ICD-10-CM | POA: Diagnosis not present

## 2016-07-05 DIAGNOSIS — Z01818 Encounter for other preprocedural examination: Secondary | ICD-10-CM | POA: Diagnosis not present

## 2016-07-05 LAB — BASIC METABOLIC PANEL
BUN/Creatinine Ratio: 17 (ref 10–24)
BUN: 23 mg/dL (ref 10–36)
CALCIUM: 8.7 mg/dL (ref 8.6–10.2)
CO2: 23 mmol/L (ref 18–29)
CREATININE: 1.36 mg/dL — AB (ref 0.76–1.27)
Chloride: 103 mmol/L (ref 96–106)
GFR, EST AFRICAN AMERICAN: 53 mL/min/{1.73_m2} — AB (ref >59)
GFR, EST NON AFRICAN AMERICAN: 45 mL/min/{1.73_m2} — AB (ref >59)
Glucose: 100 mg/dL — ABNORMAL HIGH (ref 65–99)
POTASSIUM: 4.2 mmol/L (ref 3.5–5.2)
Sodium: 141 mmol/L (ref 134–144)

## 2016-07-05 NOTE — Patient Instructions (Addendum)
Medication Instructions:   Your physician recommends that you continue on your current medications as directed. Please refer to the Current Medication list given to you today.   If you need a refill on your cardiac medications before your next appointment, please call your pharmacy.  Labwork:  NONE ORDERED  TODAY    Testing/Procedures: NONE ORDERED  TODAY    Follow-Up:  Your physician wants you to follow-up in:  IN  6  MONTHS WITH DR KLEIN  You will receive a reminder letter in the mail two months in advance. If you don't receive a letter, please call our office to schedule the follow-up appointment.      Any Other Special Instructions Will Be Listed Below (If Applicable).                                                                                                                                                   

## 2016-07-10 ENCOUNTER — Telehealth: Payer: Self-pay

## 2016-07-10 NOTE — Telephone Encounter (Signed)
Spoke with patients daughter Drema Balzarine [facility] and [place]). She is aware of normal results with stable renal function. She asked if we could fax lab to [facility] as pateint has an upcoming appt with Dr. Glenford Peers. I told her I would fax labs over for her. She was agreeable and very thankful.

## 2016-07-13 ENCOUNTER — Telehealth: Payer: Self-pay

## 2016-07-13 MED ORDER — BLOOD GLUCOSE MONITOR KIT: PACK | 0 refills | Status: DC

## 2016-07-13 NOTE — Telephone Encounter (Signed)
Spoke with Vicky from Hospice.   Rq glucometer to be sent in.   erx done.

## 2016-07-24 ENCOUNTER — Other Ambulatory Visit: Payer: Self-pay | Admitting: Internal Medicine

## 2016-07-24 DIAGNOSIS — I10 Essential (primary) hypertension: Secondary | ICD-10-CM

## 2016-07-24 DIAGNOSIS — I5022 Chronic systolic (congestive) heart failure: Secondary | ICD-10-CM

## 2016-07-26 ENCOUNTER — Ambulatory Visit: Payer: Self-pay | Admitting: Neurology

## 2016-07-30 ENCOUNTER — Other Ambulatory Visit: Payer: Self-pay | Admitting: Internal Medicine

## 2016-07-30 ENCOUNTER — Telehealth: Payer: Self-pay | Admitting: Internal Medicine

## 2016-07-30 DIAGNOSIS — K439 Ventral hernia without obstruction or gangrene: Secondary | ICD-10-CM

## 2016-07-30 DIAGNOSIS — I519 Heart disease, unspecified: Secondary | ICD-10-CM

## 2016-07-30 MED ORDER — PROCHLORPERAZINE MALEATE 5 MG PO TABS: 5.0000 mg | ORAL_TABLET | Freq: Four times a day (QID) | ORAL | 1 refills | Status: DC | PRN

## 2016-07-30 MED ORDER — PROCHLORPERAZINE MALEATE 10 MG PO TABS: 10.0000 mg | ORAL_TABLET | Freq: Three times a day (TID) | ORAL | 1 refills | Status: DC | PRN

## 2016-07-30 MED ORDER — HERNIA BELT DOUBLE LARGE MISC: 0 refills | Status: DC

## 2016-07-30 NOTE — Telephone Encounter (Signed)
Sending order to [phone] (f) AttnAbigail Butts for the hernia belt.   Gave verbal to dc BS checks.  Abigail Butts is calling back to let me know if the compazine was effective in treating the nausea and vomiting.

## 2016-07-30 NOTE — Telephone Encounter (Signed)
1. rx for nausea and vomiting 2. Compression garment to reduce hernia pain 3. Stop checking daily BS?

## 2016-07-30 NOTE — Telephone Encounter (Signed)
1. Yes, RX sent 2. They can get a hernia belt from a medical supply store 3. Yes, they can stop checking his blood sugar

## 2016-07-30 NOTE — Telephone Encounter (Signed)
Hospice called wanting to leave update, pt was diagnosed with intermittent nausea vomiting, Pt is taking Compazine and hydrocodone their hospice physician says this may be due to pain from abdominal hernia and inguinal hernia, they want to know if something can be ordered for the patient, wants to know if they can be prescribed underwear the pt can wear to help to reduce hernia discomfort. Also requesting to be called in size 30 lancet for checking blood sugar, the one they have is too small. Wants to know if they can stop checking daily blood sugar it has been normal they just want to check PRN blood sugars. Pt fell 07/29/16 but no injurys. Hospice has told family to make a follow up visit.

## 2016-07-30 NOTE — Telephone Encounter (Signed)
Rq for compazine 10 mg q4h prn nausea and vomiting.

## 2016-08-01 ENCOUNTER — Inpatient Hospital Stay (HOSPITAL_COMMUNITY)
Admission: EM | Admit: 2016-08-01 | Discharge: 2016-08-04 | DRG: 381 | Disposition: A | Discharge status: Hospice | Attending: Family Medicine | Admitting: Family Medicine

## 2016-08-01 ENCOUNTER — Ambulatory Visit (INDEPENDENT_AMBULATORY_CARE_PROVIDER_SITE_OTHER)
Admission: RE | Admit: 2016-08-01 | Discharge: 2016-08-01 | Disposition: A | Discharge status: Home / Self Care | Payer: PPO | Source: Ambulatory Visit | Attending: Internal Medicine | Admitting: Internal Medicine

## 2016-08-01 ENCOUNTER — Encounter: Payer: Self-pay | Admitting: Internal Medicine

## 2016-08-01 ENCOUNTER — Ambulatory Visit (INDEPENDENT_AMBULATORY_CARE_PROVIDER_SITE_OTHER): Payer: PPO | Admitting: Internal Medicine

## 2016-08-01 ENCOUNTER — Other Ambulatory Visit (INDEPENDENT_AMBULATORY_CARE_PROVIDER_SITE_OTHER): Payer: PPO

## 2016-08-01 ENCOUNTER — Encounter (HOSPITAL_COMMUNITY): Payer: Self-pay | Admitting: *Deleted

## 2016-08-01 VITALS — BP 124/62 | HR 70 | Temp 98.5°F | Resp 14 | Ht 68.0 in | Wt 137.8 lb

## 2016-08-01 DIAGNOSIS — N183 Chronic kidney disease, stage 3 unspecified: Secondary | ICD-10-CM

## 2016-08-01 DIAGNOSIS — Y92234 Operating room of hospital as the place of occurrence of the external cause: Secondary | ICD-10-CM | POA: Diagnosis not present

## 2016-08-01 DIAGNOSIS — H9190 Unspecified hearing loss, unspecified ear: Secondary | ICD-10-CM | POA: Diagnosis present

## 2016-08-01 DIAGNOSIS — N179 Acute kidney failure, unspecified: Secondary | ICD-10-CM | POA: Diagnosis not present

## 2016-08-01 DIAGNOSIS — I10 Essential (primary) hypertension: Secondary | ICD-10-CM | POA: Diagnosis not present

## 2016-08-01 DIAGNOSIS — K567 Ileus, unspecified: Secondary | ICD-10-CM | POA: Diagnosis not present

## 2016-08-01 DIAGNOSIS — M19041 Primary osteoarthritis, right hand: Secondary | ICD-10-CM | POA: Diagnosis present

## 2016-08-01 DIAGNOSIS — M150 Primary generalized (osteo)arthritis: Secondary | ICD-10-CM

## 2016-08-01 DIAGNOSIS — K2210 Ulcer of esophagus without bleeding: Principal | ICD-10-CM | POA: Diagnosis present

## 2016-08-01 DIAGNOSIS — R112 Nausea with vomiting, unspecified: Secondary | ICD-10-CM | POA: Diagnosis not present

## 2016-08-01 DIAGNOSIS — K5900 Constipation, unspecified: Secondary | ICD-10-CM | POA: Diagnosis present

## 2016-08-01 DIAGNOSIS — Z9049 Acquired absence of other specified parts of digestive tract: Secondary | ICD-10-CM

## 2016-08-01 DIAGNOSIS — Z7982 Long term (current) use of aspirin: Secondary | ICD-10-CM

## 2016-08-01 DIAGNOSIS — M19042 Primary osteoarthritis, left hand: Secondary | ICD-10-CM | POA: Diagnosis present

## 2016-08-01 DIAGNOSIS — F329 Major depressive disorder, single episode, unspecified: Secondary | ICD-10-CM | POA: Diagnosis present

## 2016-08-01 DIAGNOSIS — T4145XA Adverse effect of unspecified anesthetic, initial encounter: Secondary | ICD-10-CM | POA: Diagnosis not present

## 2016-08-01 DIAGNOSIS — K920 Hematemesis: Secondary | ICD-10-CM | POA: Diagnosis present

## 2016-08-01 DIAGNOSIS — R1110 Vomiting, unspecified: Secondary | ICD-10-CM | POA: Diagnosis not present

## 2016-08-01 DIAGNOSIS — R109 Unspecified abdominal pain: Secondary | ICD-10-CM

## 2016-08-01 DIAGNOSIS — I5022 Chronic systolic (congestive) heart failure: Secondary | ICD-10-CM | POA: Diagnosis not present

## 2016-08-01 DIAGNOSIS — D62 Acute posthemorrhagic anemia: Secondary | ICD-10-CM | POA: Diagnosis present

## 2016-08-01 DIAGNOSIS — Z79891 Long term (current) use of opiate analgesic: Secondary | ICD-10-CM

## 2016-08-01 DIAGNOSIS — M159 Polyosteoarthritis, unspecified: Secondary | ICD-10-CM

## 2016-08-01 DIAGNOSIS — E1122 Type 2 diabetes mellitus with diabetic chronic kidney disease: Secondary | ICD-10-CM | POA: Diagnosis present

## 2016-08-01 DIAGNOSIS — K449 Diaphragmatic hernia without obstruction or gangrene: Secondary | ICD-10-CM | POA: Diagnosis present

## 2016-08-01 DIAGNOSIS — I130 Hypertensive heart and chronic kidney disease with heart failure and stage 1 through stage 4 chronic kidney disease, or unspecified chronic kidney disease: Secondary | ICD-10-CM | POA: Diagnosis present

## 2016-08-01 DIAGNOSIS — F0391 Unspecified dementia with behavioral disturbance: Secondary | ICD-10-CM | POA: Diagnosis present

## 2016-08-01 DIAGNOSIS — E785 Hyperlipidemia, unspecified: Secondary | ICD-10-CM | POA: Diagnosis present

## 2016-08-01 DIAGNOSIS — N3281 Overactive bladder: Secondary | ICD-10-CM | POA: Diagnosis present

## 2016-08-01 DIAGNOSIS — Z87891 Personal history of nicotine dependence: Secondary | ICD-10-CM

## 2016-08-01 DIAGNOSIS — Z9842 Cataract extraction status, left eye: Secondary | ICD-10-CM

## 2016-08-01 DIAGNOSIS — Z923 Personal history of irradiation: Secondary | ICD-10-CM

## 2016-08-01 DIAGNOSIS — R140 Abdominal distension (gaseous): Secondary | ICD-10-CM | POA: Diagnosis not present

## 2016-08-01 DIAGNOSIS — E860 Dehydration: Secondary | ICD-10-CM | POA: Diagnosis present

## 2016-08-01 DIAGNOSIS — Z974 Presence of external hearing-aid: Secondary | ICD-10-CM

## 2016-08-01 DIAGNOSIS — Z833 Family history of diabetes mellitus: Secondary | ICD-10-CM

## 2016-08-01 DIAGNOSIS — D509 Iron deficiency anemia, unspecified: Secondary | ICD-10-CM | POA: Diagnosis present

## 2016-08-01 DIAGNOSIS — Z79899 Other long term (current) drug therapy: Secondary | ICD-10-CM

## 2016-08-01 DIAGNOSIS — Z8546 Personal history of malignant neoplasm of prostate: Secondary | ICD-10-CM

## 2016-08-01 DIAGNOSIS — Z85038 Personal history of other malignant neoplasm of large intestine: Secondary | ICD-10-CM

## 2016-08-01 DIAGNOSIS — Z87442 Personal history of urinary calculi: Secondary | ICD-10-CM

## 2016-08-01 DIAGNOSIS — Z9841 Cataract extraction status, right eye: Secondary | ICD-10-CM

## 2016-08-01 DIAGNOSIS — E119 Type 2 diabetes mellitus without complications: Secondary | ICD-10-CM

## 2016-08-01 DIAGNOSIS — F419 Anxiety disorder, unspecified: Secondary | ICD-10-CM | POA: Diagnosis present

## 2016-08-01 DIAGNOSIS — I952 Hypotension due to drugs: Secondary | ICD-10-CM | POA: Diagnosis not present

## 2016-08-01 LAB — COMPREHENSIVE METABOLIC PANEL
ALK PHOS: 64 U/L (ref 39–117)
ALT: 7 U/L (ref 0–53)
AST: 18 U/L (ref 0–37)
Albumin: 3.6 g/dL (ref 3.5–5.2)
BILIRUBIN TOTAL: 0.7 mg/dL (ref 0.2–1.2)
BUN: 39 mg/dL — AB (ref 6–23)
CO2: 31 meq/L (ref 19–32)
CREATININE: 2.05 mg/dL — AB (ref 0.40–1.50)
Calcium: 9.3 mg/dL (ref 8.4–10.5)
Chloride: 101 mEq/L (ref 96–112)
GFR: 39.35 mL/min — AB (ref >60.00)
GLUCOSE: 81 mg/dL (ref 70–99)
Potassium: 3.9 mEq/L (ref 3.5–5.1)
Sodium: 142 mEq/L (ref 135–145)
TOTAL PROTEIN: 6.8 g/dL (ref 6.0–8.3)

## 2016-08-01 LAB — CBC WITH DIFFERENTIAL/PLATELET
BASOS ABS: 0 10*3/uL (ref 0.0–0.1)
Basophils Relative: 0.2 % (ref 0.0–3.0)
Eosinophils Absolute: 0.1 10*3/uL (ref 0.0–0.7)
Eosinophils Relative: 1 % (ref 0.0–5.0)
HCT: 37.4 % — ABNORMAL LOW (ref 39.0–52.0)
Hemoglobin: 12.4 g/dL — ABNORMAL LOW (ref 13.0–17.0)
LYMPHS ABS: 0.6 10*3/uL — AB (ref 0.7–4.0)
Lymphocytes Relative: 5.3 % — ABNORMAL LOW (ref 12.0–46.0)
MCHC: 33.1 g/dL (ref 30.0–36.0)
MCV: 90.6 fl (ref 78.0–100.0)
MONO ABS: 0.8 10*3/uL (ref 0.1–1.0)
Monocytes Relative: 6.6 % (ref 3.0–12.0)
Neutro Abs: 10.5 10*3/uL — ABNORMAL HIGH (ref 1.4–7.7)
Neutrophils Relative %: 86.9 % — ABNORMAL HIGH (ref 43.0–77.0)
Platelets: 204 10*3/uL (ref 150.0–400.0)
RBC: 4.12 Mil/uL — AB (ref 4.22–5.81)
RDW: 14 % (ref 11.5–15.5)
WBC: 12.1 10*3/uL — ABNORMAL HIGH (ref 4.0–10.5)

## 2016-08-01 LAB — AMYLASE: Amylase: 34 U/L (ref 27–131)

## 2016-08-01 LAB — HEMOGLOBIN AND HEMATOCRIT, BLOOD
HCT: 27.5 % — ABNORMAL LOW (ref 39.0–52.0)
Hemoglobin: 9.5 g/dL — ABNORMAL LOW (ref 13.0–17.0)

## 2016-08-01 LAB — URINALYSIS, ROUTINE W REFLEX MICROSCOPIC
BILIRUBIN URINE: NEGATIVE
Glucose, UA: NEGATIVE mg/dL
HGB URINE DIPSTICK: NEGATIVE
KETONES UR: 5 mg/dL — AB
Leukocytes, UA: NEGATIVE
NITRITE: NEGATIVE
PH: 5 (ref 5.0–8.0)
Protein, ur: NEGATIVE mg/dL
Specific Gravity, Urine: 1.019 (ref 1.005–1.030)

## 2016-08-01 LAB — LIPASE: LIPASE: 1 U/L — AB (ref 11.0–59.0)

## 2016-08-01 MED ORDER — CARVEDILOL 3.125 MG PO TABS
3.1250 mg | ORAL_TABLET | Freq: Two times a day (BID) | ORAL | Status: DC
  Administered 2016-08-02 – 2016-08-04 (×5): 3.125 mg via ORAL
  Filled 2016-08-01 (×5): qty 1

## 2016-08-01 MED ORDER — ONDANSETRON HCL 4 MG/2ML IJ SOLN
4.0000 mg | Freq: Once | INTRAMUSCULAR | Status: DC
  Filled 2016-08-01: qty 2

## 2016-08-01 MED ORDER — ONDANSETRON HCL 4 MG PO TABS: 4.0000 mg | ORAL_TABLET | Freq: Four times a day (QID) | ORAL | Status: DC | PRN

## 2016-08-01 MED ORDER — ONDANSETRON HCL 4 MG/2ML IJ SOLN: 4.0000 mg | Freq: Four times a day (QID) | INTRAMUSCULAR | Status: DC | PRN

## 2016-08-01 MED ORDER — ISOSORB DINITRATE-HYDRALAZINE 20-37.5 MG PO TABS
1.0000 | ORAL_TABLET | Freq: Three times a day (TID) | ORAL | Status: DC
  Administered 2016-08-02 – 2016-08-04 (×7): 1 via ORAL
  Filled 2016-08-01 (×7): qty 1

## 2016-08-01 MED ORDER — SODIUM CHLORIDE 0.45 % IV SOLN
INTRAVENOUS | Status: DC
  Administered 2016-08-01: 20:00:00 via INTRAVENOUS

## 2016-08-01 MED ORDER — HYDROCODONE-ACETAMINOPHEN 5-325 MG PO TABS: 1.0000 | ORAL_TABLET | Freq: Four times a day (QID) | ORAL | 0 refills | Status: DC | PRN

## 2016-08-01 MED ORDER — SODIUM CHLORIDE 0.9% FLUSH
3.0000 mL | Freq: Two times a day (BID) | INTRAVENOUS | Status: DC
  Administered 2016-08-02 – 2016-08-03 (×4): 3 mL via INTRAVENOUS

## 2016-08-01 MED ORDER — ACETAMINOPHEN 325 MG PO TABS: 650.0000 mg | ORAL_TABLET | Freq: Four times a day (QID) | ORAL | Status: DC | PRN

## 2016-08-01 MED ORDER — DIVALPROEX SODIUM ER 250 MG PO TB24
250.0000 mg | ORAL_TABLET | Freq: Every day | ORAL | Status: DC
  Administered 2016-08-02 – 2016-08-04 (×2): 250 mg via ORAL
  Filled 2016-08-01 (×2): qty 1

## 2016-08-01 MED ORDER — ATORVASTATIN CALCIUM 40 MG PO TABS
40.0000 mg | ORAL_TABLET | Freq: Every day | ORAL | Status: DC
  Administered 2016-08-02 – 2016-08-03 (×2): 40 mg via ORAL
  Filled 2016-08-01 (×3): qty 1

## 2016-08-01 MED ORDER — PANTOPRAZOLE SODIUM 40 MG IV SOLR
40.0000 mg | Freq: Two times a day (BID) | INTRAVENOUS | Status: DC
  Administered 2016-08-02 (×3): 40 mg via INTRAVENOUS
  Filled 2016-08-01 (×3): qty 40

## 2016-08-01 MED ORDER — ACETAMINOPHEN 650 MG RE SUPP: 650.0000 mg | Freq: Four times a day (QID) | RECTAL | Status: DC | PRN

## 2016-08-01 MED ORDER — SODIUM CHLORIDE 0.9 % IV SOLN
INTRAVENOUS | Status: AC
  Administered 2016-08-02: via INTRAVENOUS

## 2016-08-01 MED ORDER — ALPRAZOLAM 0.5 MG PO TABS: 0.5000 mg | ORAL_TABLET | Freq: Three times a day (TID) | ORAL | Status: DC | PRN

## 2016-08-01 MED ORDER — SERTRALINE HCL 100 MG PO TABS
100.0000 mg | ORAL_TABLET | Freq: Every day | ORAL | Status: DC
  Administered 2016-08-02 – 2016-08-04 (×2): 100 mg via ORAL
  Filled 2016-08-01: qty 2
  Filled 2016-08-01: qty 1

## 2016-08-01 MED ORDER — FESOTERODINE FUMARATE ER 8 MG PO TB24
8.0000 mg | ORAL_TABLET | Freq: Every day | ORAL | Status: DC
  Administered 2016-08-02 – 2016-08-04 (×2): 8 mg via ORAL
  Filled 2016-08-01 (×3): qty 1

## 2016-08-01 NOTE — ED Provider Notes (Signed)
[facility] DEPT Provider Note   CSN: [id number] Arrival date & time: 08/01/16  1206     History   Chief Complaint Chief Complaint  Patient presents with  . Emesis    HPI Russell [name] is a 83 y.o. male.  The history is provided by the patient and a relative [facility] nurse). No language interpreter was used.  Emesis      Russell [name] is a 83 y.o. male who presents to the Emergency Department complaining of vomiting. He presents accompanied by his family for evaluation of vomiting that has been intermittent over the last 3 weeks, worsening over the last 24 hours. He is currently hospice patient for congestive heart failure. At home he has experienced intermittent abdominal pain, last episode was 2 days ago. No reports of fevers, diarrhea, dysuria. His last bowel movement was yesterday. He saw his PCP today and had outpatient labs and imaging performed. The x-ray was concerning for ileus versus enteritis and he was referred to the emergency department for further evaluation. On ED arrival patient denies any complaints and requests to drink some water. He denies any current abdominal pain  Past Medical History:  Diagnosis Date  . Anxiety associated with depression   . Chronic systolic CHF (congestive heart failure) ([place])    a. Echo 8/17: EF 20%, diff HK, ant-septal, ant-lateral inf severe HK, prominent trabeculation noted in LV apex, mod AI, trivial MR, trivial TR, small pericardial effusion // b. Limited Echo 8/17: Diff HK, inf and post AK, apical AK, mild LVH, EF 20-25%, mild AI, mild MR, mild LAE, mod to severe reduced RVSF, small pericardial effusion  . Colon cancer [facility])    s/p resection 12/06  . Dementia   . Diabetes mellitus    diet mgt (11`/12)  . FTT (failure to thrive)    admited 08/2008  . History of cystoscopy    uretheral stricutre, 2005- Dr.Humphreys  . Hyperlipidemia   . Hypertension   . Loss of hearing    being fitted for hearing aids (11/12)  . Osteoarthritis      hands  . Pneumonia   . Prostate cancer ([place])    s/p XRT  . SBO (small bowel obstruction) 03/08/2014  . Urolithiasis     Patient Active Problem List   Diagnosis Date Noted  . Intractable vomiting with nausea 08/01/2016  . Ileus ([place]) 08/01/2016  . AKI (acute kidney injury) ([place]) 08/01/2016  . Coffee ground emesis 08/01/2016  . Recurrent inguinal hernia of right side without obstruction or gangrene 05/22/2016  . Cachexia associated with cardiac disease 02/01/2016  . Chronic systolic CHF (congestive heart failure) ([place]) 01/01/2016  . Depression with anxiety 01/01/2016  . CKD (chronic kidney disease), stage III 01/01/2016  . Physical deconditioning   . Other seasonal allergic rhinitis 09/29/2015  . Diabetes mellitus type 2, diet-controlled ([place]) 07/19/2015  . Constipation 05/30/2015  . Routine general medical examination at a health care facility 01/27/2015  . Hyperglycemia 01/25/2015  . Moderate dementia with behavioral disturbance 11/05/2014  . DJD (degenerative joint disease) 02/20/2013  . Major depressive disorder, recurrent episode, severe, without mention of psychotic behavior 08/08/2011  . PVC's (premature ventricular contractions) 01/31/2009  . URINARY INCONTINENCE 07/16/2008  . ADENOCARCINOMA, COLON 05/13/2006  . ADENOCARCINOMA, PROSTATE 05/13/2006  . Hyperlipidemia with target LDL less than 100 05/13/2006  . Essential hypertension, benign 05/13/2006    Past Surgical History:  Procedure Laterality Date  . CATARACT EXTRACTION, BILATERAL    . HEMICOLECTOMY  12/06  . TRANSURETHRAL  RESECTION OF PROSTATE    . xrt     ERT for prostate ca       Home Medications    Prior to Admission medications   Medication Sig Start Date End Date Taking? Authorizing Provider  carvedilol (COREG) 3.125 MG tablet TAKE 1 TABLET(3.125 MG) BY MOUTH TWICE DAILY WITH A MEAL 04/17/16  Yes Janith Lima, MD  divalproex (DEPAKOTE ER) 250 MG 24 hr tablet Take 1 tablet (250 mg total) by  mouth daily. 02/29/16  Yes Cameron Sprang, MD  HYDROcodone-acetaminophen (NORCO/VICODIN) 5-325 MG tablet Take 1 tablet by mouth every 6 (six) hours as needed for moderate pain. 08/01/16  Yes Janith Lima, MD  ALPRAZolam Duanne Moron) 0.5 MG tablet TAKE 1 TABLET BY MOUTH THREE TIMES DAILY AS NEEDED FOR ANXIETY 03/22/16   Janith Lima, MD  aspirin 81 MG tablet Take 81 mg by mouth daily.      Historical Provider, MD  Azelastine-Fluticasone 137-50 MCG/ACT SUSP Place 1 spray into the nose 2 (two) times daily as needed.    Historical Provider, MD  Cyanocobalamin (VITAMIN B-12 PO) Take 1 tablet by mouth daily with breakfast.    Historical Provider, MD  Elastic Bandages & Supports (HERNIA BELT DOUBLE LARGE) MISC Use as needed for Hernia pain. 07/30/16   Janith Lima, MD  fesoterodine (TOVIAZ) 8 MG TB24 tablet Take 1 tablet (8 mg total) by mouth daily. 05/13/14   Janith Lima, MD  isosorbide-hydrALAZINE (BIDIL) 20-37.5 MG tablet Take 1 tablet by mouth 3 (three) times daily. 05/30/16   Janith Lima, MD  ketoconazole (NIZORAL) 2 % cream Apply 1 application topically 2 (two) times daily. 02/01/16   Janith Lima, MD  losartan (COZAAR) 50 MG tablet Take 50 mg by mouth daily.  06/08/16   Historical Provider, MD  polyethylene glycol (MIRALAX / GLYCOLAX) packet Take 17 g by mouth daily. 03/30/16   Janith Lima, MD  potassium chloride SA (K-DUR,KLOR-CON) 20 MEQ tablet Take 1 tablet (20 mEq total) by mouth daily. 03/27/16   Janith Lima, MD  prochlorperazine (COMPAZINE) 10 MG tablet Take 1 tablet (10 mg total) by mouth every 8 (eight) hours as needed for nausea or vomiting. 07/30/16   Janith Lima, MD  SENEXON-S 8.6-50 MG tablet TK 1 T PO QD 07/21/16   Historical Provider, MD  sertraline (ZOLOFT) 100 MG tablet TAKE 1 TABLET BY MOUTH DAILY 04/01/16   Janith Lima, MD  simvastatin (ZOCOR) 80 MG tablet Take 40 mg by mouth daily at 6 PM.  07/16/15   Historical Provider, MD  torsemide (DEMADEX) 20 MG tablet TAKE 1 TABLET(20  MG) BY MOUTH DAILY 07/24/16   Janith Lima, MD    Family History Family History  Problem Relation Age of Onset  . Diabetes Daughter   . Cancer Neg Hx   . Early death Neg Hx   . Heart disease Neg Hx   . Hyperlipidemia Neg Hx   . Hypertension Neg Hx   . Kidney disease Neg Hx   . Stroke Neg Hx   . Heart attack Neg Hx     Social History Social History  Substance Use Topics  . Smoking status: Former Smoker    Quit date: 03/20/1967  . Smokeless tobacco: Never Used  . Alcohol use No     Comment: quit '68     Allergies   Patient has no known allergies.   Review of Systems Review of Systems  Gastrointestinal: Positive  for vomiting.  All other systems reviewed and are negative.    Physical Exam Updated Vital Signs BP (!) 117/53 (BP Location: Right Arm)   Pulse 76   Temp 98.3 F (36.8 C) (Oral)   Resp 16   Ht 5\' 5"  (1.651 m)   Wt 135 lb 2.3 oz (61.3 kg)   SpO2 96%   BMI 22.49 kg/m   Physical Exam  Constitutional: He is oriented to person, place, and time. He appears well-developed.  Frail  HENT:  Head: Normocephalic and atraumatic.  Cardiovascular: Normal rate and regular rhythm.   No murmur heard. Pulmonary/Chest: Effort normal and breath sounds normal. No respiratory distress.  Abdominal: Soft. There is no tenderness. There is no rebound and no guarding.  Ventral upper abdominal wall hernia that is soft and nontender, easily reduced.  Musculoskeletal: He exhibits no edema or tenderness.  Neurological: He is alert and oriented to person, place, and time.  Skin: Skin is warm and dry.  Psychiatric: He has a normal mood and affect. His behavior is normal.  Nursing note and vitals reviewed.    ED Treatments / Results  Labs (all labs ordered are listed, but only abnormal results are displayed) Labs Reviewed  URINALYSIS, ROUTINE W REFLEX MICROSCOPIC - Abnormal; Notable for the following:       Result Value   Ketones, ur 5 (*)    All other components  within normal limits  HEMOGLOBIN AND HEMATOCRIT, BLOOD - Abnormal; Notable for the following:    Hemoglobin 9.5 (*)    HCT 27.5 (*)    All other components within normal limits  HEMOGLOBIN AND HEMATOCRIT, BLOOD  HEMOGLOBIN AND HEMATOCRIT, BLOOD  OCCULT BLOOD X 1 CARD TO LAB, STOOL  BASIC METABOLIC PANEL  HEMOGLOBIN AND HEMATOCRIT, BLOOD    EKG  EKG Interpretation None       Radiology Dg Abd Acute W/chest  Result Date: 08/01/2016 CLINICAL DATA:  Weight loss, vomiting for 1 week, cough EXAM: DG ABDOMEN ACUTE W/ 1V CHEST COMPARISON:  01/12/2016 FINDINGS: Cardiomediastinal silhouette is stable. No infiltrate or pleural effusion. No pulmonary edema. Mild gaseous distended small bowel loops in left upper abdomen. Mild ileus or enteritis cannot be excluded. Moderate stool are noted in right colon. Some colonic stool and gas noted distal transverse colon and proximal left colon. Moderate stool noted within rectum. Degenerative changes lumbar spine. No evidence of free abdominal air. IMPRESSION: No acute disease within chest. Mild gaseous distended small bowel loops in left upper abdomen. Mild ileus or enteritis cannot be excluded. Colonic stool and gas as described. No evidence of free abdominal air. Electronically Signed   By: Lahoma Crocker M.D.   On: 08/01/2016 11:26    Procedures Procedures (including critical care time)  Medications Ordered in ED Medications  pantoprazole (PROTONIX) injection 40 mg (40 mg Intravenous Given 08/02/16 0011)  isosorbide-hydrALAZINE (BIDIL) 20-37.5 MG per tablet 1 tablet (1 tablet Oral Given 08/02/16 0028)  carvedilol (COREG) tablet 3.125 mg (not administered)  sertraline (ZOLOFT) tablet 100 mg (not administered)  ALPRAZolam (XANAX) tablet 0.5 mg (not administered)  divalproex (DEPAKOTE ER) 24 hr tablet 250 mg (not administered)  atorvastatin (LIPITOR) tablet 40 mg (not administered)  fesoterodine (TOVIAZ) tablet 8 mg (not administered)  sodium chloride flush  (NS) 0.9 % injection 3 mL (3 mLs Intravenous Given 08/02/16 0003)  0.9 %  sodium chloride infusion ( Intravenous New Bag/Given 08/02/16 0008)  acetaminophen (TYLENOL) tablet 650 mg (not administered)    Or  acetaminophen (TYLENOL)  suppository 650 mg (not administered)  ondansetron (ZOFRAN) tablet 4 mg (not administered)    Or  ondansetron (ZOFRAN) injection 4 mg (not administered)     Initial Impression / Assessment and Plan / ED Course  I have reviewed the triage vital signs and the nursing notes.  Pertinent labs & imaging results that were available during my care of the patient were reviewed by me and considered in my medical decision making (see chart for details).     Patient presents from PCPs office for evaluation of nausea and vomiting for the last 3 weeks, considerably over the last 24 hours. He is dehydrated on examination but no recurrent vomiting in the emergency department. Outpatient labs demonstrate worsening renal function compared to 3 weeks ago. He was started on IV fluid hydration and medicine was counseled for admission for acute kidney injury. UA with no evidence UTI. Abdominal soft exam is soft and nontender, presentation is not consistent with incarcerated hernia or acute abdomen.  Final Clinical Impressions(s) / ED Diagnoses   Final diagnoses:  None    New Prescriptions Current Discharge Medication List       Quintella Reichert, MD 08/02/16 [phone]

## 2016-08-01 NOTE — ED Notes (Signed)
Per PCP sending over for possible ileus-states he cant stop vomiting

## 2016-08-01 NOTE — H&P (Signed)
History and Physical    Russell [name] VOZ:[id number] DOB: 19-Feb-1924 DOA: 08/01/2016  PCP: Scarlette Calico, MD   Patient coming from: PCP's office  Chief Complaint: Intractable nausea and vomiting x 2-3 weeks  HPI: Russell [name] is a 83 y.o. gentleman with a history of dementia, prostate cancer S/P TURP, colon cancer S/P hemicolectomy, chronic systolic heart failure (EF 20%), HTN, HLD, and diet controlled diabetes who is actually under hospice care.  He was taken to his PCP's office by family for evaluation of nausea and vomiting for the past three weeks, worse in the past 1-2 days.  A caregiver reportedly noted coffee ground emesis, but no bright red blood.  The patient has not complained of abdominal pain.  PO intake has been decreased.  No new exposures.  No known sick contacts.  Isolated episode of diarrhea.  He has had a recent fall with head trauma; not clear if he had loss of consciousness.  No chest pain, shortness of breath, or swelling.  ED Course: Xray shows mild gaseous distended small bowel loops in the left upper abdomen; mild ileus or enteritis cannot be excluded.  Stool noted throughout large intestine.  No evidence of free air.  Patient has actually been asymptomatic in the ED.  Hospitalist asked to admit for gentle hydration for AKI on CKD 3.  Creatinine 2; baseline 1.3.  Review of Systems: ROS limited by patient's history of dementia.   Past Medical History:  Diagnosis Date  . Anxiety associated with depression   . Chronic systolic CHF (congestive heart failure) ([place])    a. Echo 8/17: EF 20%, diff HK, ant-septal, ant-lateral inf severe HK, prominent trabeculation noted in LV apex, mod AI, trivial MR, trivial TR, small pericardial effusion // b. Limited Echo 8/17: Diff HK, inf and post AK, apical AK, mild LVH, EF 20-25%, mild AI, mild MR, mild LAE, mod to severe reduced RVSF, small pericardial effusion  . Colon cancer [facility])    s/p resection 12/06  . Dementia   . Diabetes  mellitus    diet mgt (11`/12)  . FTT (failure to thrive)    admited 08/2008  . History of cystoscopy    uretheral stricutre, 2005- Dr.Humphreys  . Hyperlipidemia   . Hypertension   . Loss of hearing    being fitted for hearing aids (11/12)  . Osteoarthritis    hands  . Pneumonia   . Prostate cancer ([place])    s/p XRT  . SBO (small bowel obstruction) 03/08/2014  . Urolithiasis     Past Surgical History:  Procedure Laterality Date  . CATARACT EXTRACTION, BILATERAL    . HEMICOLECTOMY  12/06  . TRANSURETHRAL RESECTION OF PROSTATE    . xrt     ERT for prostate ca     reports that he quit smoking about 49 years ago. He has never used smokeless tobacco. He reports that he does not drink alcohol or use drugs.  No Known Allergies  Family History  Problem Relation Age of Onset  . Diabetes Daughter   . Cancer Neg Hx   . Early death Neg Hx   . Heart disease Neg Hx   . Hyperlipidemia Neg Hx   . Hypertension Neg Hx   . Kidney disease Neg Hx   . Stroke Neg Hx   . Heart attack Neg Hx      Prior to Admission medications   Medication Sig Start Date End Date Taking? Authorizing Provider  carvedilol (COREG) 3.125 MG tablet TAKE 1  TABLET(3.125 MG) BY MOUTH TWICE DAILY WITH A MEAL 04/17/16  Yes Janith Lima, MD  divalproex (DEPAKOTE ER) 250 MG 24 hr tablet Take 1 tablet (250 mg total) by mouth daily. 02/29/16  Yes Cameron Sprang, MD  HYDROcodone-acetaminophen (NORCO/VICODIN) 5-325 MG tablet Take 1 tablet by mouth every 6 (six) hours as needed for moderate pain. 08/01/16  Yes Janith Lima, MD  ALPRAZolam Duanne Moron) 0.5 MG tablet TAKE 1 TABLET BY MOUTH THREE TIMES DAILY AS NEEDED FOR ANXIETY 03/22/16   Janith Lima, MD  aspirin 81 MG tablet Take 81 mg by mouth daily.      Historical Provider, MD  Azelastine-Fluticasone 137-50 MCG/ACT SUSP Place 1 spray into the nose 2 (two) times daily as needed.    Historical Provider, MD  Cyanocobalamin (VITAMIN B-12 PO) Take 1 tablet by mouth daily with  breakfast.    Historical Provider, MD  Elastic Bandages & Supports (HERNIA BELT DOUBLE LARGE) MISC Use as needed for Hernia pain. 07/30/16   Janith Lima, MD  fesoterodine (TOVIAZ) 8 MG TB24 tablet Take 1 tablet (8 mg total) by mouth daily. 05/13/14   Janith Lima, MD  isosorbide-hydrALAZINE (BIDIL) 20-37.5 MG tablet Take 1 tablet by mouth 3 (three) times daily. 05/30/16   Janith Lima, MD  ketoconazole (NIZORAL) 2 % cream Apply 1 application topically 2 (two) times daily. 02/01/16   Janith Lima, MD  losartan (COZAAR) 50 MG tablet Take 50 mg by mouth daily.  06/08/16   Historical Provider, MD  polyethylene glycol (MIRALAX / GLYCOLAX) packet Take 17 g by mouth daily. 03/30/16   Janith Lima, MD  potassium chloride SA (K-DUR,KLOR-CON) 20 MEQ tablet Take 1 tablet (20 mEq total) by mouth daily. 03/27/16   Janith Lima, MD  prochlorperazine (COMPAZINE) 10 MG tablet Take 1 tablet (10 mg total) by mouth every 8 (eight) hours as needed for nausea or vomiting. 07/30/16   Janith Lima, MD  SENEXON-S 8.6-50 MG tablet TK 1 T PO QD 07/21/16   Historical Provider, MD  sertraline (ZOLOFT) 100 MG tablet TAKE 1 TABLET BY MOUTH DAILY 04/01/16   Janith Lima, MD  simvastatin (ZOCOR) 80 MG tablet Take 40 mg by mouth daily at 6 PM.  07/16/15   Historical Provider, MD  torsemide (DEMADEX) 20 MG tablet TAKE 1 TABLET(20 MG) BY MOUTH DAILY 07/24/16   Janith Lima, MD    Physical Exam: Vitals:   08/01/16 1620 08/01/16 1621 08/01/16 1928 08/01/16 2134  BP: (!) 156/52 (!) 156/52 (!) 148/49 (!) 163/65  Pulse: 60 (!) 56 (!) 58 61  Resp: 16  18 18   Temp: 98.2 F (36.8 C)     TempSrc: Oral     SpO2: 96% 96% 98% 97%      Constitutional: NAD, calm, comfortable, NONtoxic appearing.  Seems to be disappointed that he has been referred for admission to the hospital. Vitals:   08/01/16 1620 08/01/16 1621 08/01/16 1928 08/01/16 2134  BP: (!) 156/52 (!) 156/52 (!) 148/49 (!) 163/65  Pulse: 60 (!) 56 (!) 58 61  Resp:  16  18 18   Temp: 98.2 F (36.8 C)     TempSrc: Oral     SpO2: 96% 96% 98% 97%   Eyes: PERRL, lids and conjunctivae normal ENMT: Mucous membranes are DRY. Posterior pharynx not completely visualized.  Poor dentition, missing several teeth. Neck: normal appearance, supple, no masses Respiratory: clear to auscultation bilaterally, no wheezing, no crackles. Normal respiratory  effort. No accessory muscle use.  Cardiovascular: Normal rate, regular rhythm, no murmurs / rubs / gallops. No extremity edema. 2+ pedal pulses. GI: abdomen is soft and compressible.  Reducible ventral hernia noted.  No distention.  No tenderness.  No guarding.  Bowel sounds are present. Musculoskeletal:  No joint deformity in upper and lower extremities. Good ROM, no contractures. Normal muscle tone.  Skin: no rashes, warm and dry Neurologic: Sensation intact, Strength symmetric bilaterally. Psychiatric: Oriented to person, place.  Insight and judgment limited by dementia.  Normal mood.     Labs on Admission: I have personally reviewed following labs and imaging studies  CBC:  Recent Labs Lab 08/01/16 1123  WBC 12.1*  NEUTROABS 10.5*  HGB 12.4*  HCT 37.4*  MCV 90.6  PLT 941.7   Basic Metabolic Panel:  Recent Labs Lab 08/01/16 1123  NA 142  K 3.9  CL 101  CO2 31  GLUCOSE 81  BUN 39*  CREATININE 2.05*  CALCIUM 9.3   GFR: Estimated Creatinine Clearance: 21.2 mL/min (A) (by C-G formula based on SCr of 2.05 mg/dL (H)). Liver Function Tests:  Recent Labs Lab 08/01/16 1123  AST 18  ALT 7  ALKPHOS 64  BILITOT 0.7  PROT 6.8  ALBUMIN 3.6    Recent Labs Lab 08/01/16 1123  LIPASE 1.0*  AMYLASE 34   Urine analysis:    Component Value Date/Time   COLORURINE YELLOW 08/01/2016 2053   APPEARANCEUR CLEAR 08/01/2016 2053   LABSPEC 1.019 08/01/2016 2053   PHURINE 5.0 08/01/2016 2053   GLUCOSEU NEGATIVE 08/01/2016 2053   GLUCOSEU NEGATIVE 12/13/2015 1559   HGBUR NEGATIVE 08/01/2016 2053    HGBUR large 08/03/2008 1258   [place] NEGATIVE 08/01/2016 2053   BILIRUBINUR neg 04/09/2014 1056   KETONESUR 5 (A) 08/01/2016 2053   PROTEINUR NEGATIVE 08/01/2016 2053   UROBILINOGEN 0.2 12/13/2015 1559   NITRITE NEGATIVE 08/01/2016 2053   LEUKOCYTESUR NEGATIVE 08/01/2016 2053    Radiological Exams on Admission: Dg Abd Acute W/chest  Result Date: 08/01/2016 CLINICAL DATA:  Weight loss, vomiting for 1 week, cough EXAM: DG ABDOMEN ACUTE W/ 1V CHEST COMPARISON:  01/12/2016 FINDINGS: Cardiomediastinal silhouette is stable. No infiltrate or pleural effusion. No pulmonary edema. Mild gaseous distended small bowel loops in left upper abdomen. Mild ileus or enteritis cannot be excluded. Moderate stool are noted in right colon. Some colonic stool and gas noted distal transverse colon and proximal left colon. Moderate stool noted within rectum. Degenerative changes lumbar spine. No evidence of free abdominal air. IMPRESSION: No acute disease within chest. Mild gaseous distended small bowel loops in left upper abdomen. Mild ileus or enteritis cannot be excluded. Colonic stool and gas as described. No evidence of free abdominal air. Electronically Signed   By: Lahoma Crocker M.D.   On: 08/01/2016 11:26    Assessment/Plan Principal Problem:   AKI (acute kidney injury) ([place]) Active Problems:   Essential hypertension, benign   Diabetes mellitus type 2, diet-controlled ([facility])   Chronic systolic CHF (congestive heart failure) ([facility])   CKD (chronic kidney disease), stage III   Coffee ground emesis      AKI in the setting of nausea and vomiting, mild ileus vs enteritis.  Reported coffee ground emesis but does not appear to be having a brisk GI bleed at this point. --HOLD ARB and torsemide for now --Cautious hydration with NS at 50cc/hr for 10 hours (total of 500cc) --Repeat BMP in the AM --H/H q6h x 4; check stool heme occult --IV protonix  40mg  q12h --HOLD baby aspirin for now --Ice chips and sips with  meds for now.  If hemodynamically stable overnight, can start trial of CLD in the AM  Chronic systolic heart failure, compensated at this time --Continue coreg, Bidil  History of dementia, depression --depakote, xanax, zoloft  HLD --Statin  Overactive bladder --Toviaz    DVT prophylaxis: SCDs Code Status: FULL Family Communication: Spoke to one of his daughters by phone at the time of admission. Disposition Plan: Expect he will go home when ready for discharge. Consults called: NONE Admission status: Place in observation, telemetry   TIME SPENT: 70 minutes   Eber Jones MD [facility] Pager [phone]  If 7PM-7AM, please contact night-coverage [email or web address] Password [facility]  08/01/2016, 10:41 PM

## 2016-08-01 NOTE — ED Notes (Signed)
IV team paged again and stated they were tied up and unavailable at this time. Attempted IV again x 2 w/o success.

## 2016-08-01 NOTE — Patient Instructions (Signed)
Nausea and Vomiting, Adult Nausea is the feeling that you have an upset stomach or have to vomit. As nausea gets worse, it can lead to vomiting. Vomiting occurs when stomach contents are thrown up and out of the mouth. Vomiting can make you feel weak and cause you to become dehydrated. Dehydration can make you tired and thirsty, cause you to have a dry mouth, and decrease how often you urinate. Older adults and people with other diseases or a weak immune system are at higher risk for dehydration. It is important to treat your nausea and vomiting as told by your health care provider. Follow these instructions at home: Follow instructions from your health care provider about how to care for yourself at home. Eating and drinking Follow these recommendations as told by your health care provider:  Take an oral rehydration solution (ORS). This is a drink that is sold at pharmacies and retail stores.  Drink clear fluids in small amounts as you are able. Clear fluids include water, ice chips, diluted fruit juice, and low-calorie sports drinks.  Eat bland, easy-to-digest foods in small amounts as you are able. These foods include bananas, applesauce, rice, lean meats, toast, and crackers.  Avoid fluids that contain a lot of sugar or caffeine, such as energy drinks, sports drinks, and soda.  Avoid alcohol.  Avoid spicy or fatty foods.  General instructions  Drink enough fluid to keep your urine clear or pale yellow.  Wash your hands often. If soap and water are not available, use hand sanitizer.  Make sure that all people in your household wash their hands well and often.  Take over-the-counter and prescription medicines only as told by your health care provider.  Rest at home while you recover.  Watch your condition for any changes.  Breathe slowly and deeply when you feel nauseated.  Keep all follow-up visits as told by your health care provider. This is important. Contact a health care  provider if:  You have a fever.  You cannot keep fluids down.  Your symptoms get worse.  You have new symptoms.  Your nausea does not go away after two days.  You feel light-headed or dizzy.  You have a headache.  You have muscle cramps. Get help right away if:  You have pain in your chest, neck, arm, or jaw.  You feel extremely weak or you faint.  You have persistent vomiting.  You see blood in your vomit.  Your vomit looks like black coffee grounds.  You have bloody or black stools or stools that look like tar.  You have a severe headache, a stiff neck, or both.  You have a rash.  You have severe pain, cramping, or bloating in your abdomen.  You have trouble breathing or you are breathing very quickly.  Your heart is beating very quickly.  Your skin feels cold and clammy.  You feel confused.  You have pain when you urinate.  You have signs of dehydration, such as: ? Dark urine, very little urine, or no urine. ? Cracked lips. ? Dry mouth. ? Sunken eyes. ? Sleepiness. ? Weakness. These symptoms may represent a serious problem that is an emergency. Do not wait to see if the symptoms will go away. Get medical help right away. Call your local emergency services (911 in the [place] Do not drive yourself to the hospital. This information is not intended to replace advice given to you by your health care provider. Make sure you discuss any questions you   have with your health care provider. Document Released: 04/16/2005 Document Revised: 09/19/2015 Document Reviewed: 12/21/2014 Elsevier Interactive Patient Education  2017 Elsevier Inc.  

## 2016-08-01 NOTE — Progress Notes (Signed)
Subjective:  Patient ID: Russell [name], male    DOB: July 29, 1923  Age: 83 y.o. MRN: [id number]  CC: Emesis   HPI Russell [name] presents for A several day history of intractable nausea and vomiting despite high-dose Compazine. The patient is with his daughter today and she says she cannot care for him at home anymore. He has an inguinal hernia but has been told by surgery that he is not a candidate for repair due to his advanced age. He has chronic pain for which he takes Norco. His daughter requests a refill on that today.  No facility-administered medications prior to visit.    Outpatient Medications Prior to Visit  Medication Sig Dispense Refill  . ALPRAZolam (XANAX) 0.5 MG tablet TAKE 1 TABLET BY MOUTH THREE TIMES DAILY AS NEEDED FOR ANXIETY 60 tablet 5  . aspirin 81 MG tablet Take 81 mg by mouth daily.      . Azelastine-Fluticasone 137-50 MCG/ACT SUSP Place 1 spray into the nose 2 (two) times daily as needed.    . carvedilol (COREG) 3.125 MG tablet TAKE 1 TABLET(3.125 MG) BY MOUTH TWICE DAILY WITH A MEAL 60 tablet 11  . Cyanocobalamin (VITAMIN B-12 PO) Take 1 tablet by mouth daily with breakfast.    . divalproex (DEPAKOTE ER) 250 MG 24 hr tablet Take 1 tablet (250 mg total) by mouth daily. 30 tablet 11  . Elastic Bandages & Supports (HERNIA BELT DOUBLE LARGE) MISC Use as needed for Hernia pain. 1 each 0  . fesoterodine (TOVIAZ) 8 MG TB24 tablet Take 1 tablet (8 mg total) by mouth daily. 30 tablet 11  . isosorbide-hydrALAZINE (BIDIL) 20-37.5 MG tablet Take 1 tablet by mouth 3 (three) times daily. 90 tablet 3  . ketoconazole (NIZORAL) 2 % cream Apply 1 application topically 2 (two) times daily. 60 g 2  . polyethylene glycol (MIRALAX / GLYCOLAX) packet Take 17 g by mouth daily. 14 each 5  . potassium chloride SA (K-DUR,KLOR-CON) 20 MEQ tablet Take 1 tablet (20 mEq total) by mouth daily. 90 tablet 3  . prochlorperazine (COMPAZINE) 10 MG tablet Take 1 tablet (10 mg total) by mouth every 8  (eight) hours as needed for nausea or vomiting. 90 tablet 1  . sertraline (ZOLOFT) 100 MG tablet TAKE 1 TABLET BY MOUTH DAILY 90 tablet 3  . simvastatin (ZOCOR) 80 MG tablet Take 40 mg by mouth daily at 6 PM.   3  . torsemide (DEMADEX) 20 MG tablet TAKE 1 TABLET(20 MG) BY MOUTH DAILY 90 tablet 1  . HYDROcodone-acetaminophen (NORCO/VICODIN) 5-325 MG tablet Take 1 tablet by mouth every 6 (six) hours as needed for moderate pain. 90 tablet 0    ROS Review of Systems  Constitutional: Positive for activity change, appetite change, fatigue and unexpected weight change (wt loss). Negative for chills, diaphoresis and fever.  HENT: Negative.   Eyes: Negative for visual disturbance.  Respiratory: Negative for cough, chest tightness, shortness of breath and wheezing.   Cardiovascular: Negative for chest pain, palpitations and leg swelling.  Gastrointestinal: Positive for constipation, nausea and vomiting. Negative for abdominal distention, abdominal pain, anal bleeding and diarrhea.  Endocrine: Negative.   Genitourinary: Negative.  Negative for decreased urine volume, difficulty urinating, enuresis, frequency and urgency.  Musculoskeletal: Positive for arthralgias. Negative for myalgias and neck pain.  Skin: Negative.  Negative for color change and rash.  Allergic/Immunologic: Negative.   Neurological: Positive for dizziness and light-headedness. Negative for weakness.  Hematological: Negative.  Negative for adenopathy. Does not bruise/bleed  easily.  Psychiatric/Behavioral: Negative.     Objective:  BP 124/62 (BP Location: Left Arm, Patient Position: Sitting, Cuff Size: Normal)   Pulse 70   Temp 98.5 F (36.9 C) (Oral)   Resp 14   Ht 5\' 8"  (1.727 m)   Wt 137 lb 12.8 oz (62.5 kg)   SpO2 90%   BMI 20.95 kg/m   BP Readings from Last 3 Encounters:  08/02/16 (!) 150/43  08/01/16 124/62  07/05/16 124/60    Wt Readings from Last 3 Encounters:  08/01/16 135 lb 2.3 oz (61.3 kg)  08/01/16 137  lb 12.8 oz (62.5 kg)  07/05/16 144 lb (65.3 kg)    Physical Exam  Constitutional: He is oriented to person, place, and time.  Non-toxic appearance. He does not have a sickly appearance. He does not appear ill. No distress.  HENT:  Mouth/Throat: Oropharynx is clear and moist. Mucous membranes are dry. No oropharyngeal exudate.  Eyes: Conjunctivae are normal. Right eye exhibits no discharge. Left eye exhibits no discharge. No scleral icterus.  Neck: Normal range of motion. Neck supple. No JVD present. No tracheal deviation present. No thyromegaly present.  Cardiovascular: Normal rate, regular rhythm, normal heart sounds and intact distal pulses.  Exam reveals no gallop and no friction rub.   No murmur heard. Pulmonary/Chest: Effort normal and breath sounds normal. No stridor. No respiratory distress. He has no wheezes. He has no rales. He exhibits no tenderness.  Abdominal: Soft. Bowel sounds are normal. He exhibits no distension and no mass. There is no hepatosplenomegaly or hepatomegaly. There is no tenderness. There is no rebound, no guarding and no CVA tenderness. A hernia is present. Hernia confirmed positive in the right inguinal area. Hernia confirmed negative in the ventral area and confirmed negative in the left inguinal area.  Genitourinary: Penis normal. Right testis shows no mass, no swelling and no tenderness. Right testis is descended. Left testis shows no swelling and no tenderness. Left testis is descended. Uncircumcised. No phimosis, hypospadias, penile erythema or penile tenderness. No discharge found.  Genitourinary Comments: There is an easily reducible right inguinal hernia  Musculoskeletal: Normal range of motion. He exhibits no edema, tenderness or deformity.  Lymphadenopathy:    He has no cervical adenopathy.       Right: No inguinal adenopathy present.  Neurological: He is oriented to person, place, and time.  Skin: Skin is warm and dry. No rash noted. He is not  diaphoretic. No erythema. No pallor.    Lab Results  Component Value Date   WBC 12.1 (H) 08/01/2016   HGB 10.4 (L) 08/02/2016   HCT 31.0 (L) 08/02/2016   PLT 204.0 08/01/2016   GLUCOSE 60 (L) 08/02/2016   CHOL 124 01/25/2015   TRIG 74.0 01/25/2015   HDL 46.50 01/25/2015   LDLCALC 63 01/25/2015   ALT 7 08/01/2016   AST 18 08/01/2016   NA 139 08/02/2016   K 3.7 08/02/2016   CL 103 08/02/2016   CREATININE 1.61 (H) 08/02/2016   BUN 42 (H) 08/02/2016   CO2 27 08/02/2016   TSH 1.86 01/25/2015   PSA 1.94 02/20/2013   INR 1.22 12/17/2015   HGBA1C 5.4 12/17/2015   MICROALBUR 2.2 (H) 01/25/2015    Dg Abd Acute W/chest  Result Date: 08/01/2016 CLINICAL DATA:  Weight loss, vomiting for 1 week, cough EXAM: DG ABDOMEN ACUTE W/ 1V CHEST COMPARISON:  01/12/2016 FINDINGS: Cardiomediastinal silhouette is stable. No infiltrate or pleural effusion. No pulmonary edema. Mild gaseous distended small bowel  loops in left upper abdomen. Mild ileus or enteritis cannot be excluded. Moderate stool are noted in right colon. Some colonic stool and gas noted distal transverse colon and proximal left colon. Moderate stool noted within rectum. Degenerative changes lumbar spine. No evidence of free abdominal air. IMPRESSION: No acute disease within chest. Mild gaseous distended small bowel loops in left upper abdomen. Mild ileus or enteritis cannot be excluded. Colonic stool and gas as described. No evidence of free abdominal air. Electronically Signed   By: Lahoma Crocker M.D.   On: 08/01/2016 11:26    Assessment & Plan:   Russell [name] was seen today for emesis.  Diagnoses and all orders for this visit:  Intractable vomiting with nausea, unspecified vomiting type- Examination is remarkable for an inguinal hernia that does not appear to be strangulated or incarcerated, his plain films show possible ileus and constipation, labs are concerning for dehydration and worsening renal function. I've asked him to go to the ED to  consider receiving some IV fluids and to consider further evaluation and treatment if indicated. -     Comprehensive metabolic panel; Future -     CBC with Differential/Platelet; Future -     Lipase; Future -     Amylase; Future -     DG Abd Acute W/Chest; Future  Ileus [facility])- ED referral as above  Primary osteoarthritis involving multiple joints -     HYDROcodone-acetaminophen (NORCO/VICODIN) 5-325 MG tablet; Take 1 tablet by mouth every 6 (six) hours as needed for moderate pain.   I am having Russell [name] maintain his aspirin, fesoterodine, simvastatin, Cyanocobalamin (VITAMIN B-12 PO), Azelastine-Fluticasone, ketoconazole, divalproex, ALPRAZolam, potassium chloride SA, polyethylene glycol, sertraline, carvedilol, isosorbide-hydrALAZINE, torsemide, Hernia Belt Double Large, prochlorperazine, and HYDROcodone-acetaminophen.  Meds ordered this encounter  Medications  . HYDROcodone-acetaminophen (NORCO/VICODIN) 5-325 MG tablet    Sig: Take 1 tablet by mouth every 6 (six) hours as needed for moderate pain.    Dispense:  90 tablet    Refill:  0     Follow-up: Return in about 1 day (around 08/02/2016).  Scarlette Calico, MD

## 2016-08-01 NOTE — ED Triage Notes (Signed)
Pt complains of of vomiting and not able to keep anything down for the past couple of weeks. Pt sent from PCP for possible ileus. Pt had blood work and x-ray performed today. Pt is hospice pt.

## 2016-08-01 NOTE — ED Notes (Signed)
Attempted IV start x 4 with two RNS. Will contact IV team.

## 2016-08-01 NOTE — ED Notes (Addendum)
Pt family states he has been vomiting coffee ground emesis along with a white fluid. He can't keep anything down since yesterday. Started about 2-3 weeks ago but has gotten worse. Family states that patient has fallen several times in the past. Latest fall was on Monday and he scraped the side of his face a little.

## 2016-08-01 NOTE — Progress Notes (Signed)
Pre visit review using our clinic review tool, if applicable. No additional management support is needed unless otherwise documented below in the visit note. 

## 2016-08-01 NOTE — ED Notes (Signed)
Attempting US IV at this time

## 2016-08-02 ENCOUNTER — Observation Stay (HOSPITAL_COMMUNITY)

## 2016-08-02 DIAGNOSIS — N3281 Overactive bladder: Secondary | ICD-10-CM | POA: Diagnosis present

## 2016-08-02 DIAGNOSIS — I5022 Chronic systolic (congestive) heart failure: Secondary | ICD-10-CM | POA: Diagnosis not present

## 2016-08-02 DIAGNOSIS — K922 Gastrointestinal hemorrhage, unspecified: Secondary | ICD-10-CM | POA: Diagnosis not present

## 2016-08-02 DIAGNOSIS — Z9842 Cataract extraction status, left eye: Secondary | ICD-10-CM | POA: Diagnosis not present

## 2016-08-02 DIAGNOSIS — I130 Hypertensive heart and chronic kidney disease with heart failure and stage 1 through stage 4 chronic kidney disease, or unspecified chronic kidney disease: Secondary | ICD-10-CM | POA: Diagnosis present

## 2016-08-02 DIAGNOSIS — D62 Acute posthemorrhagic anemia: Secondary | ICD-10-CM | POA: Diagnosis present

## 2016-08-02 DIAGNOSIS — I10 Essential (primary) hypertension: Secondary | ICD-10-CM | POA: Diagnosis not present

## 2016-08-02 DIAGNOSIS — F329 Major depressive disorder, single episode, unspecified: Secondary | ICD-10-CM | POA: Diagnosis present

## 2016-08-02 DIAGNOSIS — E860 Dehydration: Secondary | ICD-10-CM | POA: Diagnosis present

## 2016-08-02 DIAGNOSIS — K920 Hematemesis: Secondary | ICD-10-CM | POA: Diagnosis present

## 2016-08-02 DIAGNOSIS — I952 Hypotension due to drugs: Secondary | ICD-10-CM | POA: Diagnosis not present

## 2016-08-02 DIAGNOSIS — R1110 Vomiting, unspecified: Secondary | ICD-10-CM | POA: Diagnosis present

## 2016-08-02 DIAGNOSIS — M19041 Primary osteoarthritis, right hand: Secondary | ICD-10-CM | POA: Diagnosis present

## 2016-08-02 DIAGNOSIS — K209 Esophagitis, unspecified: Secondary | ICD-10-CM | POA: Diagnosis not present

## 2016-08-02 DIAGNOSIS — M19042 Primary osteoarthritis, left hand: Secondary | ICD-10-CM | POA: Diagnosis present

## 2016-08-02 DIAGNOSIS — Y92234 Operating room of hospital as the place of occurrence of the external cause: Secondary | ICD-10-CM | POA: Diagnosis not present

## 2016-08-02 DIAGNOSIS — H9190 Unspecified hearing loss, unspecified ear: Secondary | ICD-10-CM | POA: Diagnosis present

## 2016-08-02 DIAGNOSIS — E1122 Type 2 diabetes mellitus with diabetic chronic kidney disease: Secondary | ICD-10-CM | POA: Diagnosis present

## 2016-08-02 DIAGNOSIS — Z9841 Cataract extraction status, right eye: Secondary | ICD-10-CM | POA: Diagnosis not present

## 2016-08-02 DIAGNOSIS — E785 Hyperlipidemia, unspecified: Secondary | ICD-10-CM | POA: Diagnosis present

## 2016-08-02 DIAGNOSIS — Z7982 Long term (current) use of aspirin: Secondary | ICD-10-CM | POA: Diagnosis not present

## 2016-08-02 DIAGNOSIS — N183 Chronic kidney disease, stage 3 (moderate): Secondary | ICD-10-CM | POA: Diagnosis present

## 2016-08-02 DIAGNOSIS — T4145XA Adverse effect of unspecified anesthetic, initial encounter: Secondary | ICD-10-CM | POA: Diagnosis not present

## 2016-08-02 DIAGNOSIS — D509 Iron deficiency anemia, unspecified: Secondary | ICD-10-CM | POA: Diagnosis present

## 2016-08-02 DIAGNOSIS — F0391 Unspecified dementia with behavioral disturbance: Secondary | ICD-10-CM | POA: Diagnosis present

## 2016-08-02 DIAGNOSIS — K2210 Ulcer of esophagus without bleeding: Secondary | ICD-10-CM | POA: Diagnosis not present

## 2016-08-02 DIAGNOSIS — K5900 Constipation, unspecified: Secondary | ICD-10-CM | POA: Diagnosis present

## 2016-08-02 DIAGNOSIS — Z79891 Long term (current) use of opiate analgesic: Secondary | ICD-10-CM | POA: Diagnosis not present

## 2016-08-02 DIAGNOSIS — N179 Acute kidney failure, unspecified: Secondary | ICD-10-CM | POA: Diagnosis not present

## 2016-08-02 DIAGNOSIS — K449 Diaphragmatic hernia without obstruction or gangrene: Secondary | ICD-10-CM | POA: Diagnosis not present

## 2016-08-02 LAB — BASIC METABOLIC PANEL
ANION GAP: 9 (ref 5–15)
BUN: 42 mg/dL — ABNORMAL HIGH (ref 6–20)
CALCIUM: 8.3 mg/dL — AB (ref 8.9–10.3)
CO2: 27 mmol/L (ref 22–32)
Chloride: 103 mmol/L (ref 101–111)
Creatinine, Ser: 1.61 mg/dL — ABNORMAL HIGH (ref 0.61–1.24)
GFR calc non Af Amer: 36 mL/min — ABNORMAL LOW (ref >60)
GFR, EST AFRICAN AMERICAN: 42 mL/min — AB (ref >60)
Glucose, Bld: 60 mg/dL — ABNORMAL LOW (ref 65–99)
Potassium: 3.7 mmol/L (ref 3.5–5.1)
Sodium: 139 mmol/L (ref 135–145)

## 2016-08-02 LAB — GLUCOSE, CAPILLARY
GLUCOSE-CAPILLARY: 56 mg/dL — AB (ref 65–99)
GLUCOSE-CAPILLARY: 135 mg/dL — AB (ref 65–99)

## 2016-08-02 LAB — HEMOGLOBIN AND HEMATOCRIT, BLOOD
HCT: 31 % — ABNORMAL LOW (ref 39.0–52.0)
HEMATOCRIT: 32.3 % — AB (ref 39.0–52.0)
HEMOGLOBIN: 11 g/dL — AB (ref 13.0–17.0)
Hemoglobin: 10.4 g/dL — ABNORMAL LOW (ref 13.0–17.0)

## 2016-08-02 NOTE — Telephone Encounter (Signed)
Yes, I know, I sent him there

## 2016-08-02 NOTE — Progress Notes (Signed)
PROGRESS NOTE [facility]   Russell [name]   ZOX:[id number] DOB: Aug 09, 1923  DOA: 08/01/2016 PCP: Russell Calico, MD   Brief Narrative:  Russell [name] is a 83 y.o. gentleman with a history of dementia, prostate cancer S/P TURP, colon cancer S/P hemicolectomy, chronic systolic heart failure (EF 20%), HTN, HLD, and diet controlled diabetes who is actually under hospice care.  He was taken to his PCP's office by family for evaluation of nausea and vomiting for the past three weeks, worse in the past 1-2 days.  A caregiver reportedly noted coffee ground emesis, but no bright red blood.  The patient has not complained of abdominal pain.  PO intake has been decreased. Abdomen w chest  Xray shows mild gaseous distended small bowel loops in the left upper abdomen; mild ileus or enteritis cannot be excluded.  Stool noted throughout large intestine. Repeated abdominal xray with 2 views show no signs of illeus or obstruction. GI was consulted and plan for EGD in AM. Hemoglobin with mild drop, likely due to IVF.   Subjective: Patient seen and examined, have no complains, no more nausea or vomiting. No BM since yesterday. No acute events overnight.   Assessment & Plan: Upper GI bleed - coffee ground emesis, mild drop in Hgb  On PPI therapy  GI consulted recommendations appreciated - for EGD in AM  Abdominal xray 2 views shows no ileus   AKI - dehydration due to vomiting  Improving with IVF Holding diuretics and ARB  Continue gentle hydration - monitor for fluid overload as patient EF is 20-25% Monitor BMP in AM   Anemia - likely from acute bleed, IVF contributing to hemodilution as well, FOBT pending  Hgb stable  Anemia panel  Check CBC   Chronic systolic CHF - EF 81-19% Seems to be compensated at this time  Continue home meds Holding ARB and torsemide in view of AKI   Dementia - stable  On Depakote, xanax and zoloft  High risk for sundowning - if agitation consider haldol  Monitor    DVT prophylaxis: SCD's  Code Status: FULL  Family Communication: None at bedside  Disposition Plan: Home when medically stable   Consultants:   GI - [facility]   Procedures:   Antimicrobials:    Objective: Vitals:   08/02/16 0015 08/02/16 0018 08/02/16 0622 08/02/16 1320  BP: (!) 134/51 (!) 117/53 (!) 150/43 (!) 101/48  Pulse: 72 76 (!) 57 61  Resp:   16 18  Temp:   97.9 F (36.6 C) 98 F (36.7 C)  TempSrc:   Oral Oral  SpO2:   98% 100%  Weight:      Height:        Intake/Output Summary (Last 24 hours) at 08/02/16 1605 Last data filed at 08/02/16 1026  Gross per 24 hour  Intake           326.33 ml  Output              150 ml  Net           176.33 ml   Filed Weights   08/01/16 2343  Weight: 61.3 kg (135 lb 2.3 oz)    Examination:  General exam: Appears calm and comfortable  HEENT: OP moist and clear Respiratory system: Clear to auscultation. No wheezes,crackle or rhonchi Cardiovascular system: S1S2 + S3, RRR 21/6 SM LLSB. No JVD, murmurs, rubs or gallops Gastrointestinal system: Abdomen is nondistended, soft and nontender.  Central nervous system: Alert but confused  Extremities:  No pedal edema.   Skin: No rashes, lesions or ulcers  Data Reviewed: I have personally reviewed following labs and imaging studies  CBC:  Recent Labs Lab 08/01/16 1123 08/01/16 2304 08/02/16 0405 08/02/16 1019  WBC 12.1*  --   --   --   NEUTROABS 10.5*  --   --   --   HGB 12.4* 9.5* 10.4* 11.0*  HCT 37.4* 27.5* 31.0* 32.3*  MCV 90.6  --   --   --   PLT 204.0  --   --   --    Basic Metabolic Panel:  Recent Labs Lab 08/01/16 1123 08/02/16 0405  NA 142 139  K 3.9 3.7  CL 101 103  CO2 31 27  GLUCOSE 81 60*  BUN 39* 42*  CREATININE 2.05* 1.61*  CALCIUM 9.3 8.3*   GFR: Estimated Creatinine Clearance: 26.4 mL/min (A) (by C-G formula based on SCr of 1.61 mg/dL (H)). Liver Function Tests:  Recent Labs Lab 08/01/16 1123  AST 18  ALT 7  ALKPHOS 64  BILITOT 0.7   PROT 6.8  ALBUMIN 3.6    Recent Labs Lab 08/01/16 1123  LIPASE 1.0*  AMYLASE 34   No results for input(s): AMMONIA in the last 168 hours. Coagulation Profile: No results for input(s): INR, PROTIME in the last 168 hours. Cardiac Enzymes: No results for input(s): CKTOTAL, CKMB, CKMBINDEX, TROPONINI in the last 168 hours. BNP (last 3 results) No results for input(s): PROBNP in the last 8760 hours. HbA1C: No results for input(s): HGBA1C in the last 72 hours. CBG:  Recent Labs Lab 08/02/16 1030 08/02/16 1131  GLUCAP 56* 135*   Lipid Profile: No results for input(s): CHOL, HDL, LDLCALC, TRIG, CHOLHDL, LDLDIRECT in the last 72 hours. Thyroid Function Tests: No results for input(s): TSH, T4TOTAL, FREET4, T3FREE, THYROIDAB in the last 72 hours. Anemia Panel: No results for input(s): VITAMINB12, FOLATE, FERRITIN, TIBC, IRON, RETICCTPCT in the last 72 hours. Sepsis Labs: No results for input(s): PROCALCITON, LATICACIDVEN in the last 168 hours.  No results found for this or any previous visit (from the past 240 hour(s)).     Radiology Studies: Dg Abd 2 Views  Result Date: 08/02/2016 CLINICAL DATA:  Coffee-ground emesis. EXAM: ABDOMEN - 2 VIEW COMPARISON:  Abdominal CT 03/09/2014 FINDINGS: Normal bowel gas pattern. No visualized pneumoperitoneum. Bowel sutures over the right abdomen. Known right renal calculus measuring 11 mm. Lung bases are clear when allowing for a nipple shadow on the right. Confluent osteophytes with ankylosis. IMPRESSION: 1. Normal bowel gas pattern.  No evidence of pneumoperitoneum. 2. Right nephrolithiasis. 3. Spondyloarthropathy with diffuse ankylosis. Electronically Signed   By: Russell [name] M.D.   On: 08/02/2016 09:20   Dg Abd Acute W/chest  Result Date: 08/01/2016 CLINICAL DATA:  Weight loss, vomiting for 1 week, cough EXAM: DG ABDOMEN ACUTE W/ 1V CHEST COMPARISON:  01/12/2016 FINDINGS: Cardiomediastinal silhouette is stable. No infiltrate or pleural  effusion. No pulmonary edema. Mild gaseous distended small bowel loops in left upper abdomen. Mild ileus or enteritis cannot be excluded. Moderate stool are noted in right colon. Some colonic stool and gas noted distal transverse colon and proximal left colon. Moderate stool noted within rectum. Degenerative changes lumbar spine. No evidence of free abdominal air. IMPRESSION: No acute disease within chest. Mild gaseous distended small bowel loops in left upper abdomen. Mild ileus or enteritis cannot be excluded. Colonic stool and gas as described. No evidence of free abdominal air. Electronically Signed   By: Julien Girt  Pop M.D.   On: 08/01/2016 11:26    Scheduled Meds: . atorvastatin  40 mg Oral q1800  . carvedilol  3.125 mg Oral BID WC  . divalproex  250 mg Oral Daily  . fesoterodine  8 mg Oral Daily  . isosorbide-hydrALAZINE  1 tablet Oral TID  . pantoprazole (PROTONIX) IV  40 mg Intravenous Q12H  . sertraline  100 mg Oral Daily  . sodium chloride flush  3 mL Intravenous Q12H   Continuous Infusions:   LOS: 0 days    Chipper Oman, MD Pager: Text Page via [email or web address]  [phone]  If 7PM-7AM, please contact night-coverage [email or web address] Password [email or web address] 08/02/2016, 4:05 PM

## 2016-08-02 NOTE — Telephone Encounter (Signed)
Hospice called stating pt was admmited for [facility] for ileus.

## 2016-08-02 NOTE — Progress Notes (Addendum)
[facility] - [facility] RN visit at 0845am.  This is a related and covered hospital admission from 08/01/16 with [facility] diagnosis of Congestive Heart Failure, per Dr. Cherie Ouch.  Patient is a FULL CODE.  Patient was sent to ED by MD for possible ileus.  Patient has been having symptoms of N/V for 2 weeks with coffee ground emesis.  Visited with patient in room.  Patient was alert and oriented to person, situation, place, but not time.  Patient states "I feel a lot better, they gave me some fluids".  Patient in NAD and does not complain of anything at this time.  Patient was picked up for xray while I was visiting.  No family was at the bedside this morning.   Patient is receiving pantoprazole (PROTONIX) injection 40 mg, Dose 40 mg, Q12H via IV.  No continuous medications or antibiotics at this time.    [facility] will continue to follow patient while in hospital and anticipate any discharge needs. Dr. Cherie Ouch and Dr. Scarlette Calico advised of patient admission.  Transfer summary and medication list to be attached to shadow chart.   Thank you,  Edyth Gunnels, RN, [facility]iaison [phone]  All Hospital Liaison's are now on [facility].

## 2016-08-02 NOTE — Telephone Encounter (Signed)
Forwarding as fyi

## 2016-08-02 NOTE — Consult Note (Signed)
Subjective:   HPI  The patient is a 83 year old male with a history of dementia and prostate cancer and colon cancer in the past chronic heart failure, hypertension and hyperlipidemia and diet-controlled diabetes who is under the care of hospice. He was admitted to the hospital because of persistent problems with nausea and vomiting over the past 3 weeks and also recent coffee-ground emesis. We were called because of the coffee-ground emesis and the persistent ongoing problems with nausea and vomiting. There has been a drop in hemoglobin and hematocrit. He denies history of peptic ulcer.  Review of Systems    Past Medical History:  Diagnosis Date  . Anxiety associated with depression   . Chronic systolic CHF (congestive heart failure) ([place])    a. Echo 8/17: EF 20%, diff HK, ant-septal, ant-lateral inf severe HK, prominent trabeculation noted in LV apex, mod AI, trivial MR, trivial TR, small pericardial effusion // b. Limited Echo 8/17: Diff HK, inf and post AK, apical AK, mild LVH, EF 20-25%, mild AI, mild MR, mild LAE, mod to severe reduced RVSF, small pericardial effusion  . Colon cancer [facility])    s/p resection 12/06  . Dementia   . Diabetes mellitus    diet mgt (11`/12)  . FTT (failure to thrive)    admited 08/2008  . History of cystoscopy    uretheral stricutre, 2005- Dr.Humphreys  . Hyperlipidemia   . Hypertension   . Loss of hearing    being fitted for hearing aids (11/12)  . Osteoarthritis    hands  . Pneumonia   . Prostate cancer ([place])    s/p XRT  . SBO (small bowel obstruction) 03/08/2014  . Urolithiasis    Past Surgical History:  Procedure Laterality Date  . CATARACT EXTRACTION, BILATERAL    . HEMICOLECTOMY  12/06  . TRANSURETHRAL RESECTION OF PROSTATE    . xrt     ERT for prostate ca   Social History   Social History  . Marital status: Widowed    Spouse name: N/A  . Number of children: N/A  . Years of education: N/A   Occupational History  . Not on file.    Social History Main Topics  . Smoking status: Former Smoker    Quit date: 03/20/1967  . Smokeless tobacco: Never Used  . Alcohol use No     Comment: quit '68  . Drug use: No  . Sexual activity: Not Currently   Other Topics Concern  . Not on file   Social History Narrative   7th grade education. Married '51 - 9/09. 3 daughters- '51, '53, '66; 1 son- '54. Lives w/ daughter Dub Mikes who has schizophrenia since age 79. 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  Current Facility-Administered Medications:  .  acetaminophen (TYLENOL) tablet 650 mg, 650 mg, Oral, Q6H PRN **OR** acetaminophen (TYLENOL) suppository 650 mg, 650 mg, Rectal, Q6H PRN, Lily Kocher, MD .  ALPRAZolam Duanne Moron) tablet 0.5 mg, 0.5 mg, Oral, TID PRN, Lily Kocher, MD .  atorvastatin (LIPITOR) tablet 40 mg, 40 mg, Oral, q1800, Lily Kocher, MD .  carvedilol (COREG) tablet 3.125 mg, 3.125 mg, Oral, BID WC, Lily Kocher, MD, 3.125 mg at 08/02/16 1027 .  divalproex (DEPAKOTE ER) 24 hr tablet 250 mg, 250 mg, Oral, Daily, Lily Kocher, MD, 250 mg at 08/02/16 1027 .  fesoterodine (TOVIAZ) tablet 8 mg, 8 mg, Oral, Daily, Lily Kocher, MD,  8 mg at 08/02/16 1026 .  isosorbide-hydrALAZINE (BIDIL) 20-37.5 MG per tablet 1 tablet, 1 tablet, Oral, TID, Lily Kocher, MD, 1 tablet at 08/02/16 1024 .  ondansetron (ZOFRAN) tablet 4 mg, 4 mg, Oral, Q6H PRN **OR** ondansetron (ZOFRAN) injection 4 mg, 4 mg, Intravenous, Q6H PRN, Lily Kocher, MD .  pantoprazole (PROTONIX) injection 40 mg, 40 mg, Intravenous, Q12H, Lily Kocher, MD, 40 mg at 08/02/16 1027 .  sertraline (ZOLOFT) tablet 100 mg, 100 mg, Oral, Daily, Lily Kocher, MD, 100 mg at 08/02/16 1026 .  sodium chloride flush (NS) 0.9 % injection 3 mL, 3 mL, Intravenous, Q12H, Lily Kocher, MD, 3 mL at 08/02/16 1028 No Known Allergies   Objective:     BP (!) 101/48 (BP Location: Right Arm)   Pulse 61   Temp 98 F (36.7 C)  (Oral)   Resp 18   Ht 5\' 5"  (1.651 m)   Wt 61.3 kg (135 lb 2.3 oz)   SpO2 100%   BMI 22.49 kg/m   He is in no acute distress  Heart regular rhythm  Lungs clear  Abdomen soft and nontender no hepatosplenomegaly  Laboratory No components found for: D1    Assessment:     Nausea and vomiting which has been persistent for several weeks  Coffee-ground emesis  Rule out ileus      Plan:     PPI therapy. We will proceed with EGD to evaluate upper GI tract. Lab Results  Component Value Date   HGB 11.0 (L) 08/02/2016   HGB 10.4 (L) 08/02/2016   HGB 9.5 (L) 08/01/2016   HGB 14.3 09/20/2006   HGB 14.5 06/11/2006   HGB 14.6 03/07/2006   HCT 32.3 (L) 08/02/2016   HCT 31.0 (L) 08/02/2016   HCT 27.5 (L) 08/01/2016   HCT 41.4 09/20/2006   HCT 42.9 06/11/2006   HCT 42.9 03/07/2006   ALKPHOS 64 08/01/2016   ALKPHOS 95 12/31/2015   ALKPHOS 84 12/16/2015   AST 18 08/01/2016   AST 52 (H) 12/31/2015   AST 45 (H) 12/16/2015   ALT 7 08/01/2016   ALT 29 12/31/2015   ALT 39 12/16/2015   AMYLASE 34 08/01/2016

## 2016-08-03 ENCOUNTER — Encounter (HOSPITAL_COMMUNITY)
Admission: EM | Disposition: A | Discharge status: Hospice | Payer: Self-pay | Source: Home / Self Care | Attending: Family Medicine

## 2016-08-03 ENCOUNTER — Encounter (HOSPITAL_COMMUNITY): Payer: Self-pay | Admitting: *Deleted

## 2016-08-03 ENCOUNTER — Inpatient Hospital Stay (HOSPITAL_COMMUNITY): Admitting: Anesthesiology

## 2016-08-03 DIAGNOSIS — K2210 Ulcer of esophagus without bleeding: Principal | ICD-10-CM

## 2016-08-03 HISTORY — PX: ESOPHAGOGASTRODUODENOSCOPY (EGD) WITH PROPOFOL: SHX5813

## 2016-08-03 LAB — RETICULOCYTES
RBC.: 3.39 MIL/uL — ABNORMAL LOW (ref 4.22–5.81)
Retic Count, Absolute: 57.6 10*3/uL (ref 19.0–186.0)
Retic Ct Pct: 1.7 % (ref 0.4–3.1)

## 2016-08-03 LAB — IRON AND TIBC
IRON: 32 ug/dL — AB (ref 45–182)
Saturation Ratios: 18 % (ref 17.9–39.5)
TIBC: 181 ug/dL — AB (ref 250–450)
UIBC: 149 ug/dL

## 2016-08-03 LAB — CBC WITH DIFFERENTIAL/PLATELET
Basophils Absolute: 0 10*3/uL (ref 0.0–0.1)
Basophils Relative: 0 %
EOS PCT: 4 %
Eosinophils Absolute: 0.4 10*3/uL (ref 0.0–0.7)
HCT: 29.1 % — ABNORMAL LOW (ref 39.0–52.0)
Hemoglobin: 9.9 g/dL — ABNORMAL LOW (ref 13.0–17.0)
LYMPHS ABS: 0.6 10*3/uL — AB (ref 0.7–4.0)
LYMPHS PCT: 8 %
MCH: 29.2 pg (ref 26.0–34.0)
MCHC: 34 g/dL (ref 30.0–36.0)
MCV: 85.8 fL (ref 78.0–100.0)
MONO ABS: 0.6 10*3/uL (ref 0.1–1.0)
Monocytes Relative: 8 %
Neutro Abs: 6.6 10*3/uL (ref 1.7–7.7)
Neutrophils Relative %: 80 %
Platelets: 184 10*3/uL (ref 150–400)
RBC: 3.39 MIL/uL — AB (ref 4.22–5.81)
RDW: 14 % (ref 11.5–15.5)
WBC: 8.2 10*3/uL (ref 4.0–10.5)

## 2016-08-03 LAB — BASIC METABOLIC PANEL
Anion gap: 4 — ABNORMAL LOW (ref 5–15)
BUN: 34 mg/dL — AB (ref 6–20)
CO2: 28 mmol/L (ref 22–32)
Calcium: 8 mg/dL — ABNORMAL LOW (ref 8.9–10.3)
Chloride: 106 mmol/L (ref 101–111)
Creatinine, Ser: 1.23 mg/dL (ref 0.61–1.24)
GFR calc Af Amer: 58 mL/min — ABNORMAL LOW (ref >60)
GFR calc non Af Amer: 50 mL/min — ABNORMAL LOW (ref >60)
GLUCOSE: 83 mg/dL (ref 65–99)
POTASSIUM: 3.6 mmol/L (ref 3.5–5.1)
Sodium: 138 mmol/L (ref 135–145)

## 2016-08-03 LAB — GLUCOSE, CAPILLARY
GLUCOSE-CAPILLARY: 88 mg/dL (ref 65–99)
Glucose-Capillary: 162 mg/dL — ABNORMAL HIGH (ref 65–99)

## 2016-08-03 LAB — FOLATE: FOLATE: 26.1 ng/mL (ref >5.9)

## 2016-08-03 LAB — FERRITIN: FERRITIN: 184 ng/mL (ref 24–336)

## 2016-08-03 LAB — VITAMIN B12: Vitamin B-12: 1425 pg/mL — ABNORMAL HIGH (ref 180–914)

## 2016-08-03 SURGERY — ESOPHAGOGASTRODUODENOSCOPY (EGD) WITH PROPOFOL
Anesthesia: Monitor Anesthesia Care

## 2016-08-03 MED ORDER — PROPOFOL 10 MG/ML IV BOLUS
INTRAVENOUS | Status: AC
  Filled 2016-08-03: qty 20

## 2016-08-03 MED ORDER — PROPOFOL 10 MG/ML IV BOLUS
INTRAVENOUS | Status: DC | PRN
  Administered 2016-08-03: 10 mg via INTRAVENOUS
  Administered 2016-08-03: 20 mg via INTRAVENOUS
  Administered 2016-08-03 (×2): 10 mg via INTRAVENOUS

## 2016-08-03 MED ORDER — PANTOPRAZOLE SODIUM 40 MG PO TBEC
40.0000 mg | DELAYED_RELEASE_TABLET | Freq: Two times a day (BID) | ORAL | Status: DC
  Administered 2016-08-03 – 2016-08-04 (×2): 40 mg via ORAL
  Filled 2016-08-03 (×2): qty 1

## 2016-08-03 MED ORDER — SODIUM CHLORIDE 0.9 % IV SOLN
125.0000 mg | Freq: Once | INTRAVENOUS | Status: AC
  Administered 2016-08-03: 125 mg via INTRAVENOUS
  Filled 2016-08-03: qty 5

## 2016-08-03 MED ORDER — SODIUM CHLORIDE 0.9 % IV SOLN
INTRAVENOUS | Status: DC
  Administered 2016-08-03: 05:00:00 via INTRAVENOUS

## 2016-08-03 SURGICAL SUPPLY — 15 items

## 2016-08-03 NOTE — Progress Notes (Signed)
[facility] - [facility] RN visit at 8:15 am  This is a related and covered hospital admission from 08/01/16 with [facility] diagnosis of Congestive Heart Failure, per Dr. Cherie Ouch.  Patient is a FULL CODE.  Patient was sent to ED by MD for possible ileus.  Patient has been having symptoms of N/V for 2 weeks with coffee ground emesis.  Patient seen in room, resting comfortably in bed.  Patient is alert and oriented to person, place and situation this morning.  He denies pain.  He denied having any episodes of emesis overnight.  The staff RN confirmed he has not had any more vomiting.  Patient is currently NPO and scheduled to have an EGD this morning, however was able to tolerate clear liquid diet yesterday.  He is currently receiving IV Protonix 40 mg Q 12 hours and NS at [facility] via a PIV.  He has not required any PRN medications the past 24 hours, per chart review. External urinary catheter in place with minimal dark, yellow urine noted in drainage bag. Patient's hgb was 9.9 this morning.  No family present during visit.  Patient verbalized desire for spiritual support during visit.  Offered prayed of comfort and patient verbalized gratefulness.  Left message for [facility] chaplain to follow up.  [facility] will continue to follow patient while in hospital and anticipate any discharge needs.  Please feel free to contact with any hospice-related questions or concerns.  Thank you,  Freddi Starr, RN, [facility] Liaison [place] are now on [place].

## 2016-08-03 NOTE — Op Note (Signed)
[facility] Patient Name: Russell [name] Procedure Date: 08/03/2016 MRN: [id number] Attending MD: Wonda Horner , MD Date of Birth: April 11, 1924 CSN: [id number] Age: 83 Admit Type: Inpatient Procedure:                Upper GI endoscopy Indications:              Hematemesis Providers:                Wonda Horner, MD, Laverta Baltimore RN, RN, Alfonso Patten, Technician, Apache Alday CRNA, CRNA Referring MD:              Medicines:                Propofol per Anesthesia Complications:            No immediate complications. Estimated Blood Loss:     Estimated blood loss: none. Procedure:                Pre-Anesthesia Assessment:                           - Prior to the procedure, a History and Physical                            was performed, and patient medications and                            allergies were reviewed. The patient's tolerance of                            previous anesthesia was also reviewed. The risks                            and benefits of the procedure and the sedation                            options and risks were discussed with the patient.                            All questions were answered, and informed consent                            was obtained. Prior Anticoagulants: The patient has                            taken no previous anticoagulant or antiplatelet                            agents. ASA Grade Assessment: III - A patient with                            severe systemic disease. After reviewing the risks  and benefits, the patient was deemed in                            satisfactory condition to undergo the procedure.                           After obtaining informed consent, the endoscope was                            passed under direct vision. Throughout the                            procedure, the patient's blood pressure, pulse, and                            oxygen  saturations were monitored continuously. The                            Endoscope was introduced through the mouth, and                            advanced to the second part of duodenum. The upper                            GI endoscopy was accomplished without difficulty.                            The patient tolerated the procedure well. Scope In: Scope Out: Findings:      Esophagitis was found.      One esophageal ulcer was found in distal esophagus.      A medium-sized hiatal hernia was present.      The examined duodenum was normal. Impression:               - Esophagitis.                           - Esophageal ulcer.                           - Medium-sized hiatal hernia.                           - Normal examined duodenum.                           - No specimens collected. Moderate Sedation:      . Recommendation:           - Resume regular diet.                           - Continue present medications.                           - No active bleeding seen. Continue PPI. We will  sign off. Call if needed. Procedure Code(s):        --- Professional ---                           [phone], Esophagogastroduodenoscopy, flexible,                            transoral; diagnostic, including collection of                            specimen(s) by brushing or washing, when performed                            (separate procedure) Diagnosis Code(s):        --- Professional ---                           K20.9, Esophagitis, unspecified                           K22.10, Ulcer of esophagus without bleeding                           K44.9, Diaphragmatic hernia without obstruction or                            gangrene                           K92.0, Hematemesis CPT copyright 2016 American Medical Association. All rights reserved. The codes documented in this report are preliminary and upon coder review may  be revised to meet current compliance  requirements. Wonda Horner, MD 08/03/2016 2:23:22 PM This report has been signed electronically. Number of Addenda: 0

## 2016-08-03 NOTE — Anesthesia Preprocedure Evaluation (Signed)
Anesthesia Evaluation  Patient identified by MRN, date of birth, ID band Patient awake    Reviewed: Allergy & Precautions, H&P , Patient's Chart, lab work & pertinent test results, reviewed documented beta blocker date and time   Airway Mallampati: II  TM Distance: >3 FB Neck ROM: full    Dental no notable dental hx.    Pulmonary former smoker,    Pulmonary exam normal breath sounds clear to auscultation       Cardiovascular hypertension,  Rhythm:regular Rate:Normal     Neuro/Psych    GI/Hepatic   Endo/Other  diabetes  Renal/GU      Musculoskeletal   Abdominal   Peds  Hematology  (+) anemia ,   Anesthesia Other Findings 4.  Surgical clearance for hernia in 3/18 says: With advanced age and LV dysfunction, he is at high risk for any procedures. Would avoid surgery if able. If surgery is necessary, would continue HF medications in the peri-operative period and be mindful of fluid status.   Reproductive/Obstetrics                             Anesthesia Physical Anesthesia Plan  ASA: II  Anesthesia Plan: MAC   Post-op Pain Management:    Induction: Intravenous  Airway Management Planned: Mask and Natural Airway  Additional Equipment:   Intra-op Plan:   Post-operative Plan:   Informed Consent: I have reviewed the patients History and Physical, chart, labs and discussed the procedure including the risks, benefits and alternatives for the proposed anesthesia with the patient or authorized representative who has indicated his/her understanding and acceptance.   Dental Advisory Given  Plan Discussed with: CRNA and Surgeon  Anesthesia Plan Comments:         Anesthesia Quick Evaluation

## 2016-08-03 NOTE — Transfer of Care (Signed)
Immediate Anesthesia Transfer of Care Note  Patient: Russell [name]  Procedure(s) Performed: Procedure(s): ESOPHAGOGASTRODUODENOSCOPY (EGD) WITH PROPOFOL (N/A)  Patient Location: PACU  Anesthesia Type:MAC  Level of Consciousness: sedated  Airway & Oxygen Therapy: Patient Spontanous Breathing and Patient connected to nasal cannula oxygen  Post-op Assessment: Report given to RN and Post -op Vital signs reviewed and stable  Post vital signs: Reviewed and stable  Last Vitals:  Vitals:   08/03/16 0459 08/03/16 1338  BP: (!) 145/43 (!) 159/48  Pulse: 62 (!) 53  Resp: 18 14  Temp: 36.8 C 36.8 C    Last Pain:  Vitals:   08/03/16 1338  TempSrc: Oral         Complications: No apparent anesthesia complications

## 2016-08-03 NOTE — Consult Note (Signed)
   [facility] Inpatient Consult   08/03/2016  Russell [name] 01/07/1924 [id number]     Patient screened for potential [facility] Care Management program services. Chart reviewed and noted [name] is active with hospice services with [facility]. Therefore, [facility] Care Management not appropriate at this time.   Marthenia Rolling, MSN-Ed, RN,BSN [facility] Liaison [phone]

## 2016-08-03 NOTE — Progress Notes (Signed)
Pt tol dinner meal without issues, family at bedside.SRP, RN

## 2016-08-03 NOTE — Progress Notes (Signed)
[facility] Work note Patient was resting comfortably during visit and no family were present. He lives with disabled daughter and has support from two other daughters. He will return home for care after discharge. LCSW met with hospital liaison RN, [place] for update. LCSW will follow up with daughter, Russell [name] to assess needs.  Katherina Right, [place]

## 2016-08-03 NOTE — Progress Notes (Signed)
Pt return from ENDO in stable condition. Alert and able to swallow without issues. VSs. SRP, RN

## 2016-08-03 NOTE — Progress Notes (Signed)
PROGRESS NOTE [facility]   Tykwon Fera   ZOX:[id number] DOB: February 16, 1924  DOA: 08/01/2016 PCP: Scarlette Calico, MD   Brief Narrative:  Russell [name] is a 83 y.o. gentleman with a history of dementia, prostate cancer S/P TURP, colon cancer S/P hemicolectomy, chronic systolic heart failure (EF 20%), HTN, HLD, and diet controlled diabetes who is actually under hospice care.  He was taken to his PCP's office by family for evaluation of nausea and vomiting for the past three weeks, worse in the past 1-2 days.  A caregiver reportedly noted coffee ground emesis, but no bright red blood.  The patient has not complained of abdominal pain.  PO intake has been decreased. Abdomen w chest  Xray shows mild gaseous distended small bowel loops in the left upper abdomen; mild ileus or enteritis cannot be excluded.  Stool noted throughout large intestine. Repeated abdominal xray with 2 views show no signs of illeus or obstruction. S/p EGD found esophageal ulcer not active bleeding    Subjective: Patient seen and examined, no new complaints. Daughter concern about patient unable to walk and weakness.   Assessment & Plan: Upper GI bleed 2/2 esophageal ulcer  S/p EGD 08/03/16 Initial presentation coffee ground emesis, mild drop in Hgb  On PPI therapy  GI consulted recommendations appreciated Abdominal xray 2 views shows no ileus  Resume regular diet  Monitor Hgb in AM   AKI - dehydration due to vomiting - Improved with IVF  Continue holding diuretics and ARB  D/c IVF  Continue gentle hydration - monitor for fluid overload as patient EF is 20-25% Monitor BMP in AM   Anemia - likely from acute bleed, IVF contributing to hemodilution as well, FOBT pending, also Iron deficiency Hgb stable  Will add Iron supplement 325 TID, will give one dose of IV  Monitor CBC in AM   Chronic systolic CHF - EF 81-19% Seems to be compensated at this time  Continue home meds Holding ARB and torsemide in view of initial  AKI and now mild hypotensive likely due to anesthesia   Dementia - stable  On Depakote, xanax and zoloft  High risk for sundowning - if agitation consider haldol  Monitor  Will get PT prior to D/c   DVT prophylaxis: SCD's  Code Status: FULL  Family Communication: None at bedside  Disposition Plan: Home when medically stable   Consultants:   GI - [facility]   Procedures:   Antimicrobials:    Objective: Vitals:   08/03/16 1420 08/03/16 1430 08/03/16 1440 08/03/16 1536  BP: (!) 112/36 (!) 130/45 (!) 151/49 133/75  Pulse:    (!) 54  Resp: (!) 25 (!) 21 20 (!) 22  Temp: 98.5 F (36.9 C)   98.5 F (36.9 C)  TempSrc: Oral   Oral  SpO2: 99% 100% 98% 95%  Weight:      Height:        Intake/Output Summary (Last 24 hours) at 08/03/16 1641 Last data filed at 08/03/16 1417  Gross per 24 hour  Intake              440 ml  Output              200 ml  Net              240 ml   Filed Weights   08/01/16 2343  Weight: 61.3 kg (135 lb 2.3 oz)    Examination:  General exam: NAD Respiratory system: CTA Cardiovascular system: S1S2 + S3,  RRR 21/6 SM LLSB. No JVD, murmurs, rubs or gallops Gastrointestinal system: Abd soft NTND Central nervous system: Alert but confused  Extremities: No pedal edema.   Data Reviewed: I have personally reviewed following labs and imaging studies  CBC:  Recent Labs Lab 08/01/16 1123 08/01/16 2304 08/02/16 0405 08/02/16 1019 08/03/16 0554  WBC 12.1*  --   --   --  8.2  NEUTROABS 10.5*  --   --   --  6.6  HGB 12.4* 9.5* 10.4* 11.0* 9.9*  HCT 37.4* 27.5* 31.0* 32.3* 29.1*  MCV 90.6  --   --   --  85.8  PLT 204.0  --   --   --  315   Basic Metabolic Panel:  Recent Labs Lab 08/01/16 1123 08/02/16 0405 08/03/16 0554  NA 142 139 138  K 3.9 3.7 3.6  CL 101 103 106  CO2 31 27 28   GLUCOSE 81 60* 83  BUN 39* 42* 34*  CREATININE 2.05* 1.61* 1.23  CALCIUM 9.3 8.3* 8.0*   GFR: Estimated Creatinine Clearance: 34.6 mL/min (by C-G formula  based on SCr of 1.23 mg/dL). Liver Function Tests:  Recent Labs Lab 08/01/16 1123  AST 18  ALT 7  ALKPHOS 64  BILITOT 0.7  PROT 6.8  ALBUMIN 3.6    Recent Labs Lab 08/01/16 1123  LIPASE 1.0*  AMYLASE 34   No results for input(s): AMMONIA in the last 168 hours. Coagulation Profile: No results for input(s): INR, PROTIME in the last 168 hours. Cardiac Enzymes: No results for input(s): CKTOTAL, CKMB, CKMBINDEX, TROPONINI in the last 168 hours. BNP (last 3 results) No results for input(s): PROBNP in the last 8760 hours. HbA1C: No results for input(s): HGBA1C in the last 72 hours. CBG:  Recent Labs Lab 08/02/16 1030 08/02/16 1131 08/03/16 0010  GLUCAP 56* 135* 88   Lipid Profile: No results for input(s): CHOL, HDL, LDLCALC, TRIG, CHOLHDL, LDLDIRECT in the last 72 hours. Thyroid Function Tests: No results for input(s): TSH, T4TOTAL, FREET4, T3FREE, THYROIDAB in the last 72 hours. Anemia Panel:  Recent Labs  08/03/16 0548 08/03/16 0554  VITAMINB12 1,425*  --   FOLATE 26.1  --   FERRITIN 184  --   TIBC 181*  --   IRON 32*  --   RETICCTPCT  --  1.7   Sepsis Labs: No results for input(s): PROCALCITON, LATICACIDVEN in the last 168 hours.  No results found for this or any previous visit (from the past 240 hour(s)).    Radiology Studies: Dg Abd 2 Views  Result Date: 08/02/2016 CLINICAL DATA:  Coffee-ground emesis. EXAM: ABDOMEN - 2 VIEW COMPARISON:  Abdominal CT 03/09/2014 FINDINGS: Normal bowel gas pattern. No visualized pneumoperitoneum. Bowel sutures over the right abdomen. Known right renal calculus measuring 11 mm. Lung bases are clear when allowing for a nipple shadow on the right. Confluent osteophytes with ankylosis. IMPRESSION: 1. Normal bowel gas pattern.  No evidence of pneumoperitoneum. 2. Right nephrolithiasis. 3. Spondyloarthropathy with diffuse ankylosis. Electronically Signed   By: Monte Fantasia M.D.   On: 08/02/2016 09:20    Scheduled Meds: .  atorvastatin  40 mg Oral q1800  . carvedilol  3.125 mg Oral BID WC  . divalproex  250 mg Oral Daily  . fesoterodine  8 mg Oral Daily  . isosorbide-hydrALAZINE  1 tablet Oral TID  . pantoprazole (PROTONIX) IV  40 mg Intravenous Q12H  . sertraline  100 mg Oral Daily  . sodium chloride flush  3 mL Intravenous  Q12H   Continuous Infusions:   LOS: 1 day    Chipper Oman, MD Pager: Text Page via [email or web address]  [phone]  If 7PM-7AM, please contact night-coverage [email or web address] Password [place] 08/03/2016, 4:41 PM

## 2016-08-04 LAB — CBC WITH DIFFERENTIAL/PLATELET
BASOS ABS: 0 10*3/uL (ref 0.0–0.1)
Basophils Relative: 0 %
Eosinophils Absolute: 0.3 10*3/uL (ref 0.0–0.7)
Eosinophils Relative: 5 %
HEMATOCRIT: 29.7 % — AB (ref 39.0–52.0)
Hemoglobin: 10.3 g/dL — ABNORMAL LOW (ref 13.0–17.0)
LYMPHS PCT: 14 %
Lymphs Abs: 0.9 10*3/uL (ref 0.7–4.0)
MCH: 30.3 pg (ref 26.0–34.0)
MCHC: 34.7 g/dL (ref 30.0–36.0)
MCV: 87.4 fL (ref 78.0–100.0)
Monocytes Absolute: 0.5 10*3/uL (ref 0.1–1.0)
Monocytes Relative: 8 %
NEUTROS ABS: 4.7 10*3/uL (ref 1.7–7.7)
Neutrophils Relative %: 73 %
Platelets: 192 10*3/uL (ref 150–400)
RBC: 3.4 MIL/uL — AB (ref 4.22–5.81)
RDW: 13.7 % (ref 11.5–15.5)
WBC: 6.4 10*3/uL (ref 4.0–10.5)

## 2016-08-04 LAB — BASIC METABOLIC PANEL
ANION GAP: 6 (ref 5–15)
BUN: 25 mg/dL — ABNORMAL HIGH (ref 6–20)
CO2: 26 mmol/L (ref 22–32)
Calcium: 7.9 mg/dL — ABNORMAL LOW (ref 8.9–10.3)
Chloride: 107 mmol/L (ref 101–111)
Creatinine, Ser: 1.11 mg/dL (ref 0.61–1.24)
GFR calc Af Amer: >60 mL/min (ref >60)
GFR, EST NON AFRICAN AMERICAN: 56 mL/min — AB (ref >60)
Glucose, Bld: 93 mg/dL (ref 65–99)
POTASSIUM: 3.5 mmol/L (ref 3.5–5.1)
SODIUM: 139 mmol/L (ref 135–145)

## 2016-08-04 MED ORDER — PANTOPRAZOLE SODIUM 40 MG PO TBEC: 40.0000 mg | DELAYED_RELEASE_TABLET | Freq: Two times a day (BID) | ORAL | 0 refills | Status: DC

## 2016-08-04 NOTE — Care Management (Signed)
[place] [facility] RN Visit  This is a related and covered hospital admission from 08/01/16 with [facility] diagnosis of Congestive Heart Failure, per MD Cherie Ouch.  Patient is a FULL CODE.  Patient was sent to ED by MD for possible ileus.  Patient has been having symptoms of N/V for 2 weeks with coffee ground emesis.  RN visited with him in his room on today and he denies any pain at the present.  He is sitting upright in bed.  IVF of NS at 58ml/hr currently infusing.  RN inquires about meals and appetite and he notes, "I'm doing better with that than I did at home.  I was throwing it all up at home, but not here."  RN acknowledged.  No family present and at his bedside this AM.  Staff RN If[place] notes no new concerns for him this AM.    08/03/16 MD [place] for upper endoscopy for Hematemesis.  Impression:  Esophagitis, Esophageal ulcer, Medium-sized hiatal hernia, normal examined duodenum, and no specimens collected.  Recommendations:  Resume regular diet, continue present medications, no active bleeding seen, continue PPI. Post Endoscopy, nursing staff notes no concerns with difficulty swallowing.   Labs:  CBC 08/03/16 Hgb 9.9 from 11.0 on 08/02/16, Hct 29.1 08/03/16 from 32.3 on 08/02/16.  BMP 08/03/16 BUN 34 from 42 on 08/02/16, Calcium 08/03/16 8.0 from 8.3 08/02/16.  CBG 08/03/16 88 and noted 135 on 08/02/16.  Please call with any hospice related questions or concerns.  Thank you Claire Shown. [place]ingo Amber RN MSN [place] RN [phone]

## 2016-08-04 NOTE — Care Management Note (Signed)
Received a call from MD stating that pt is ready to be D/C today and hospice care needs to be resumed.   Notified Hospice and [place] that pt is ready to be D/C today. I called [phone] and left message with receptionist.

## 2016-08-04 NOTE — Evaluation (Signed)
Physical Therapy Evaluation Patient Details Name: Russell [name] MRN: [id number] DOB: Sep 24, 1923 Today's Date: 08/04/2016   History of Present Illness  83 y.o. gentleman with a history of dementia, prostate cancer S/P TURP, colon cancer S/P hemicolectomy, chronic systolic heart failure (EF 20%), HTN, HLD, and diet controlled diabetes who is actually under hospice care and admitted for upper GI bleed due to esophageal ulcer   Clinical Impression  Pt admitted with above diagnosis. Pt currently with functional limitations due to the deficits listed below (see PT Problem List).  Pt will benefit from skilled PT to increase their independence and safety with mobility to allow discharge to the venue listed below.  Pt reports his daughter assists him with transfers and minimal ambulation at home.  He states he mostly stays in bed.  If daughter unable to provide current assist level, pt may need SNF.  Pt also currently under hospice care.     Follow Up Recommendations Supervision/Assistance - 24 hour;No PT follow up (currently enrolled in hospice, may need SNF if daughter unable to physically assist)    Equipment Recommendations  Rolling walker with 5" wheels;Hospital bed;Wheelchair (measurements PT) (if pt does not already own)    Recommendations for Other Services       Precautions / Restrictions Precautions Precautions: Fall      Mobility  Bed Mobility Overal bed mobility: Needs Assistance Bed Mobility: Supine to Sit;Sit to Supine     Supine to sit: Mod assist;HOB elevated Sit to supine: Min guard   General bed mobility comments: verbal cues for technique, assist for trunk upright  Transfers Overall transfer level: Needs assistance Equipment used: Rolling walker (2 wheeled) Transfers: Sit to/from [facility] Sit to Stand: Mod assist Stand pivot transfers: Min assist       General transfer comment: verbal cues for technique, assist to rise and steady, pt ambulated  in a circle and then returned to bed (min assist for steadying)  Ambulation/Gait                Stairs            Wheelchair Mobility    Modified Rankin (Stroke Patients Only)       Balance Overall balance assessment: Needs assistance         Standing balance support: Bilateral upper extremity supported Standing balance-Leahy Scale: Zero Standing balance comment: requires UE support and external assist                             Pertinent Vitals/Pain Pain Assessment: No/denies pain    Home Living Family/patient expects to be discharged to:: Private residence Living Arrangements: Children Available Help at Discharge: Family;Available 24 hours/day Type of Home: House Home Access: Ramped entrance     Home Layout: One level Home Equipment: Walker - 2 wheels;Walker - 4 wheels;Cane - single point;Bedside commode;Shower seat;Wheelchair - manual      Prior Function Level of Independence: Needs assistance   Gait / Transfers Assistance Needed: Uses cane for ambulation     Comments: Pt reports using a cane for ambulation. also states he has w/c and ramp, states daughter assists him with transfers     Hand Dominance        Extremity/Trunk Assessment   Upper Extremity Assessment Upper Extremity Assessment: Generalized weakness    Lower Extremity Assessment Lower Extremity Assessment: Generalized weakness       Communication   Communication: HOH  Cognition Arousal/Alertness:  Awake/alert Behavior During Therapy: WFL for tasks assessed/performed Overall Cognitive Status: History of cognitive impairments - at baseline                                        General Comments      Exercises     Assessment/Plan    PT Assessment Patient needs continued PT services  PT Problem List Decreased strength;Decreased mobility;Decreased balance;Decreased knowledge of use of DME;Decreased activity tolerance       PT  Treatment Interventions DME instruction;Gait training;Therapeutic activities;Therapeutic exercise;Patient/family education;Functional mobility training    PT Goals (Current goals can be found in the Care Plan section)  Acute Rehab PT Goals PT Goal Formulation: With patient Time For Goal Achievement: 08/18/16 Potential to Achieve Goals: Good    Frequency Min 3X/week   Barriers to discharge        Co-evaluation               End of Session Equipment Utilized During Treatment: Gait belt Activity Tolerance: Patient limited by fatigue Patient left: in bed;with call bell/phone within reach;with bed alarm set   PT Visit Diagnosis: Difficulty in walking, not elsewhere classified (R26.2)    Time: 1638-4536 PT Time Calculation (min) (ACUTE ONLY): 21 min   Charges:   PT Evaluation $PT Eval Low Complexity: 1 Procedure     PT G CodesCarmelia Bake, PT, DPT 08/04/2016 Pager: [phone]   York Ram E 08/04/2016, 11:36 AM

## 2016-08-04 NOTE — Discharge Summary (Signed)
Physician Discharge Summary  Russell [name]  EQA:[id number]  DOB: 21-Apr-1924  DOA: 08/01/2016 PCP: Scarlette Calico, MD  Admit date: 08/01/2016 Discharge date: 08/04/2016  Admitted From: Home  Disposition:  Home   Recommendations for Outpatient Follow-up:  1. Follow up with PCP in 1-2 weeks 2. Please obtain BMP/CBC in one week to monitor Cr and hemoglobin   Home Health: Aide, Home hospice services   Discharge Condition: Stable  CODE STATUS: FULL  Diet recommendation: Heart Healthy   Brief/Interim Summary: Russell [name] a 83 y.o.gentleman with a history of dementia, prostate cancer S/P TURP, colon cancer S/P hemicolectomy, chronic systolic heart failure (EF 20%), HTN, HLD, and diet controlled diabetes who is actually under hospice care. He was taken to his PCP's office by family for evaluation of nausea and vomiting for the past three weeks, worse in the past 1-2 days. A caregiver reportedly noted coffee ground emesis, but no bright red blood. The patient has not complained of abdominal pain. PO intake has been decreased. Abdomen w chest Xray shows mild gaseous distended small bowel loops in the left upper abdomen; mild ileus or enteritis cannot be excluded. Stool noted throughout large intestine. Repeated abdominal xray with 2 views show no signs of illeus or obstruction. S/p EGD found esophageal ulcer not active bleeding. Patient remained hemodynamically stable, work with PT tolerated regular diet well and will be discharge home with [facility] aide.    Subjective: Patient seen and examined, have no complaints this morning. Patient denies N/V/D no chest pain or SOB.   Discharge Diagnoses/Hospital Course:  Upper GI bleed 2/2 esophageal ulcer  S/p EGD 08/03/16 Initial presentation coffee ground emesis, mild drop in Hgb  Treated with PPI IV BID - will continue Protonix 40 mg BID  Abdominal xray 2 views shows no ileus  Follow up with GI in 3-4 weeks   AKI - dehydration due to vomiting - Resolved  with IVF  Initially diuretics and ARB were held  Can resume diuretics now  Monitor BMP in 1 week  Encourage oral hydration   Anemia - likely from acute bleed, IVF contributing to hemodilution as well, also Iron deficiency Hgb stable  IV iron x 1 dose given  Continue Iron supplement 325 TID Monitor CBC in 1 week  Chronic systolic CHF - EF 97-98% Seems to be compensated at this time  Continue home meds Holded ARB and torsemide in view of initial AKI , can resume in the morning and follow up with PCP   Dementia - stable  On Depakote, xanax and zoloft  PT recommending 24 hr help at home vs SNF - discussed with daughter she stated that he have home services along with [facility] aide and his niece is a Therapist, sports. They have been taking care of him and they will continue with the current plan of care. No interest on SNF.  All other chronic medical condition were stable during the hospitalization.  Patient was seen by physical therapy, 24 hr supervision which patient has at home. Nurse aid, Niece is a Therapist, sports and Hospice services  On the day of the discharge the patient's vitals were stable, and no other acute medical condition were reported by patient. Patient was felt safe to be discharge to home   Discharge Instructions  You were cared for by a hospitalist during your hospital stay. If you have any questions about your discharge medications or the care you received while you were in the hospital after you are discharged, you can call the unit  and asked to speak with the hospitalist on call if the hospitalist that took care of you is not available. Once you are discharged, your primary care physician will handle any further medical issues. Please note that NO REFILLS for any discharge medications will be authorized once you are discharged, as it is imperative that you return to your primary care physician (or establish a relationship with a primary care physician if you do not have one) for your aftercare needs  so that they can reassess your need for medications and monitor your lab values.  Discharge Instructions    Call MD for:  difficulty breathing, headache or visual disturbances    Complete by:  As directed    Call MD for:  extreme fatigue    Complete by:  As directed    Call MD for:  hives    Complete by:  As directed    Call MD for:  persistant dizziness or light-headedness    Complete by:  As directed    Call MD for:  persistant nausea and vomiting    Complete by:  As directed    Call MD for:  redness, tenderness, or signs of infection (pain, swelling, redness, odor or green/yellow discharge around incision site)    Complete by:  As directed    Call MD for:  severe uncontrolled pain    Complete by:  As directed    Call MD for:  temperature >100.4    Complete by:  As directed    Diet - low sodium heart healthy    Complete by:  As directed    Face-to-face encounter (required for Medicare/Medicaid patients)    Complete by:  As directed    The encounter with the patient was in whole, or in part, for the following medical condition, which is the primary reason for home health care:  Upper GI bleed   I certify that, based on my findings, the following services are medically necessary home health services:   Physical therapy Nursing     Reason for Medically Necessary Home Health Services:  Therapy- Personnel officer, Public librarian   My clinical findings support the need for the above services:  Unable to leave home safely without assistance and/or assistive device   Further, I certify that my clinical findings support that this patient is homebound due to:  Unable to leave home safely without assistance   Home Health    Complete by:  As directed    To provide the following care/treatments:   PT RN Home Health Aide     Increase activity slowly    Complete by:  As directed      Allergies as of 08/04/2016   No Known Allergies     Medication List    TAKE these  medications   ALPRAZolam 0.5 MG tablet Commonly known as:  XANAX TAKE 1 TABLET BY MOUTH THREE TIMES DAILY AS NEEDED FOR ANXIETY   aspirin 81 MG tablet Take 81 mg by mouth daily.   Azelastine-Fluticasone 137-50 MCG/ACT Susp Place 1 spray into the nose 2 (two) times daily as needed (allergies).   carvedilol 3.125 MG tablet Commonly known as:  COREG TAKE 1 TABLET(3.125 MG) BY MOUTH TWICE DAILY WITH A MEAL   divalproex 250 MG 24 hr tablet Commonly known as:  DEPAKOTE ER Take 1 tablet (250 mg total) by mouth daily.   fesoterodine 8 MG Tb24 tablet Commonly known as:  TOVIAZ Take 1 tablet (8 mg total)  by mouth daily.   Hernia Belt Double Large Misc Use as needed for Hernia pain.   HYDROcodone-acetaminophen 5-325 MG tablet Commonly known as:  NORCO/VICODIN Take 1 tablet by mouth every 6 (six) hours as needed for moderate pain.   isosorbide-hydrALAZINE 20-37.5 MG tablet Commonly known as:  BIDIL Take 1 tablet by mouth 3 (three) times daily.   pantoprazole 40 MG tablet Commonly known as:  PROTONIX Take 1 tablet (40 mg total) by mouth 2 (two) times daily.   polyethylene glycol packet Commonly known as:  MIRALAX / GLYCOLAX Take 17 g by mouth daily. What changed:  when to take this  reasons to take this   potassium chloride SA 20 MEQ tablet Commonly known as:  K-DUR,KLOR-CON Take 1 tablet (20 mEq total) by mouth daily.   prochlorperazine 10 MG tablet Commonly known as:  COMPAZINE Take 1 tablet (10 mg total) by mouth every 8 (eight) hours as needed for nausea or vomiting.   SENEXON-S 8.6-50 MG tablet Generic drug:  senna-docusate TK 1 T PO QD   sertraline 100 MG tablet Commonly known as:  ZOLOFT TAKE 1 TABLET BY MOUTH DAILY   simvastatin 80 MG tablet Commonly known as:  ZOCOR Take 40 mg by mouth daily at 6 PM.   torsemide 20 MG tablet Commonly known as:  DEMADEX TAKE 1 TABLET(20 MG) BY MOUTH DAILY   VITAMIN B-12 PO Take 1 tablet by mouth daily with  breakfast.      Follow-up Information    Scarlette Calico, MD. Schedule an appointment as soon as possible for a visit in 1 week(s).   Specialty:  Internal Medicine Contact information: [address] [place] [address] [phone]        Cassell Clement, MD. Schedule an appointment as soon as possible for a visit in 4 week(s).   Specialty:  Gastroenterology Contact information: [address] [place] [address] [phone]          No Known Allergies  Consultations:  GI - [facility]    Procedures/Studies: Dg Abd 2 Views  Result Date: 08/02/2016 CLINICAL DATA:  Coffee-ground emesis. EXAM: ABDOMEN - 2 VIEW COMPARISON:  Abdominal CT 03/09/2014 FINDINGS: Normal bowel gas pattern. No visualized pneumoperitoneum. Bowel sutures over the right abdomen. Known right renal calculus measuring 11 mm. Lung bases are clear when allowing for a nipple shadow on the right. Confluent osteophytes with ankylosis. IMPRESSION: 1. Normal bowel gas pattern.  No evidence of pneumoperitoneum. 2. Right nephrolithiasis. 3. Spondyloarthropathy with diffuse ankylosis. Electronically Signed   By: Monte Fantasia M.D.   On: 08/02/2016 09:20   Dg Abd Acute W/chest  Result Date: 08/01/2016 CLINICAL DATA:  Weight loss, vomiting for 1 week, cough EXAM: DG ABDOMEN ACUTE W/ 1V CHEST COMPARISON:  01/12/2016 FINDINGS: Cardiomediastinal silhouette is stable. No infiltrate or pleural effusion. No pulmonary edema. Mild gaseous distended small bowel loops in left upper abdomen. Mild ileus or enteritis cannot be excluded. Moderate stool are noted in right colon. Some colonic stool and gas noted distal transverse colon and proximal left colon. Moderate stool noted within rectum. Degenerative changes lumbar spine. No evidence of free abdominal air. IMPRESSION: No acute disease within chest. Mild gaseous distended small bowel loops in left upper abdomen. Mild ileus or enteritis cannot be excluded.  Colonic stool and gas as described. No evidence of free abdominal air. Electronically Signed   By: Lahoma Crocker M.D.   On: 08/01/2016 11:26    EGD 08/03/16  Impression:                    - Esophagitis.                           - Esophageal ulcer.                           - Medium-sized hiatal hernia.                           - Normal examined duodenum.                           - No specimens collected.      . Recommendation:                - Resume regular diet.                           - Continue present medications.                           - No active bleeding seen. Continue PPI.   Discharge Exam: Vitals:   08/03/16 2058 08/04/16 0527  BP: (!) 123/44 (!) 126/44  Pulse: 60 61  Resp: (!) 24 20  Temp: 98.6 F (37 C) 98 F (36.7 C)   Vitals:   08/03/16 1440 08/03/16 1536 08/03/16 2058 08/04/16 0527  BP: (!) 151/49 133/75 (!) 123/44 (!) 126/44  Pulse:  (!) 54 60 61  Resp: 20 (!) 22 (!) 24 20  Temp:  98.5 F (36.9 C) 98.6 F (37 C) 98 F (36.7 C)  TempSrc:  Oral Oral Oral  SpO2: 98% 95% 98% 99%  Weight:      Height:        General: Pt is alert, awake, not in acute distress Cardiovascular: RRR, S1/S2 +, no rubs, no gallops Respiratory: CTA bilaterally, no wheezing, no rhonchi Abdominal: Soft, NT, ND, bowel sounds + Extremities: no edema, no cyanosis  The results of significant diagnostics from this hospitalization (including imaging, microbiology, ancillary and laboratory) are listed below for reference.     Microbiology: No results found for this or any previous visit (from the past 240 hour(s)).   Labs: BNP (last 3 results)  Recent Labs  12/16/15 1532 12/31/15 2350  BNP 2,189.3* 9,211.9*   Basic Metabolic Panel:  Recent Labs Lab 08/01/16 1123 08/02/16 0405 08/03/16 0554 08/04/16 0531  NA 142 139 138 139  K 3.9 3.7 3.6 3.5  CL 101 103 106 107  CO2 31 27 28 26   GLUCOSE 81 60* 83 93  BUN 39* 42* 34* 25*  CREATININE 2.05* 1.61* 1.23 1.11   CALCIUM 9.3 8.3* 8.0* 7.9*   Liver Function Tests:  Recent Labs Lab 08/01/16 1123  AST 18  ALT 7  ALKPHOS 64  BILITOT 0.7  PROT 6.8  ALBUMIN 3.6    Recent Labs Lab 08/01/16 1123  LIPASE 1.0*  AMYLASE 34   No results for input(s): AMMONIA in the last 168 hours. CBC:  Recent Labs Lab 08/01/16 1123 08/01/16 2304 08/02/16 0405 08/02/16 1019 08/03/16 0554 08/04/16 0531  WBC 12.1*  --   --   --  8.2 6.4  NEUTROABS 10.5*  --   --   --  6.6 4.7  HGB 12.4* 9.5* 10.4* 11.0* 9.9* 10.3*  HCT 37.4* 27.5* 31.0* 32.3* 29.1* 29.7*  MCV 90.6  --   --   --  85.8 87.4  PLT 204.0  --   --   --  184 192   Cardiac Enzymes: No results for input(s): CKTOTAL, CKMB, CKMBINDEX, TROPONINI in the last 168 hours. BNP: Invalid input(s): POCBNP CBG:  Recent Labs Lab 08/02/16 1030 08/02/16 1131 08/03/16 0010 08/03/16 2029  GLUCAP 56* 135* 88 162*   D-Dimer No results for input(s): DDIMER in the last 72 hours. Hgb A1c No results for input(s): HGBA1C in the last 72 hours. Lipid Profile No results for input(s): CHOL, HDL, LDLCALC, TRIG, CHOLHDL, LDLDIRECT in the last 72 hours. Thyroid function studies No results for input(s): TSH, T4TOTAL, T3FREE, THYROIDAB in the last 72 hours.  Invalid input(s): FREET3 Anemia work up  Recent Labs  08/03/16 0548 08/03/16 0554  VITAMINB12 1,425*  --   FOLATE 26.1  --   FERRITIN 184  --   TIBC 181*  --   IRON 32*  --   RETICCTPCT  --  1.7   Urinalysis    Component Value Date/Time   COLORURINE YELLOW 08/01/2016 2053   APPEARANCEUR CLEAR 08/01/2016 2053   LABSPEC 1.019 08/01/2016 2053   PHURINE 5.0 08/01/2016 2053   GLUCOSEU NEGATIVE 08/01/2016 2053   GLUCOSEU NEGATIVE 12/13/2015 1559   HGBUR NEGATIVE 08/01/2016 2053   HGBUR large 08/03/2008 [place] 08/01/2016 2053   BILIRUBINUR neg 04/09/2014 1056   KETONESUR 5 (A) 08/01/2016 2053   PROTEINUR NEGATIVE 08/01/2016 2053   UROBILINOGEN 0.2 12/13/2015 1559    NITRITE NEGATIVE 08/01/2016 2053   LEUKOCYTESUR NEGATIVE 08/01/2016 2053   Sepsis Labs Invalid input(s): PROCALCITONIN,  WBC,  LACTICIDVEN Microbiology No results found for this or any previous visit (from the past 240 hour(s)).   Time coordinating discharge: 35 minutes  SIGNED:  Chipper Oman, MD  [facility] 08/04/2016, 1:49 PM  Pager please text page via  [email or web address] Password [email or web address]

## 2016-08-07 ENCOUNTER — Telehealth: Payer: Self-pay | Admitting: *Deleted

## 2016-08-07 ENCOUNTER — Encounter (HOSPITAL_COMMUNITY): Payer: Self-pay | Admitting: Gastroenterology

## 2016-08-07 NOTE — Anesthesia Postprocedure Evaluation (Signed)
Anesthesia Post Note  Patient: Russell [name]  Procedure(s) Performed: Procedure(s) (LRB): ESOPHAGOGASTRODUODENOSCOPY (EGD) WITH PROPOFOL (N/A)  Patient location during evaluation: PACU Anesthesia Type: MAC Level of consciousness: awake and alert Pain management: pain level controlled Vital Signs Assessment: post-procedure vital signs reviewed and stable Respiratory status: spontaneous breathing, nonlabored ventilation, respiratory function stable and patient connected to nasal cannula oxygen Cardiovascular status: stable and blood pressure returned to baseline Anesthetic complications: no       Last Vitals:  Vitals:   08/04/16 0527 08/04/16 1400  BP: (!) 126/44 (!) 105/58  Pulse: 61 77  Resp: 20 18  Temp: 36.7 C 37.6 C    Last Pain:  Vitals:   08/04/16 1400  TempSrc: Oral                 Dannis Deroche Antwan

## 2016-08-07 NOTE — Telephone Encounter (Signed)
Transition Care Management Follow-up Telephone Call   Date discharged? 08/04/16   How have you been since you were released from the hospital? [place] w/pt daughter Mrs. Herbin she states dad is doing ok   Do you understand why you were in the hospital? YES   Do you understand the discharge instructions? YES   Where were you discharged to? : Home   Items Reviewed:  Medications reviewed: YES  Allergies reviewed: YES  Dietary changes reviewed: YES, heart healthy  Referrals reviewed: NO   Functional Questionnaire:   Activities of Daily Living (ADLs):   She states he are independent in the following: bathing and hygiene, feeding, grooming, toileting and dressing   States they require assistance with the following: ambulation and continence   Any transportation issues/concerns?: NO   Any patient concerns? NO   Confirmed importance and date/time of follow-up visits scheduled YES, appt 08/15/16  Provider Appointment booked with Dr.Jones  Confirmed with patient if condition begins to worsen call PCP or go to the ER.  Patient was given the office number and encouraged to call back with question or concerns.  : YES

## 2016-08-15 ENCOUNTER — Encounter: Payer: Self-pay | Admitting: Internal Medicine

## 2016-08-15 ENCOUNTER — Ambulatory Visit (INDEPENDENT_AMBULATORY_CARE_PROVIDER_SITE_OTHER): Payer: Medicare Other | Admitting: Internal Medicine

## 2016-08-15 VITALS — BP 104/44 | HR 77 | Temp 97.7°F | Resp 16 | Ht 65.0 in | Wt 134.2 lb

## 2016-08-15 DIAGNOSIS — R059 Cough, unspecified: Secondary | ICD-10-CM

## 2016-08-15 DIAGNOSIS — I10 Essential (primary) hypertension: Secondary | ICD-10-CM | POA: Diagnosis not present

## 2016-08-15 DIAGNOSIS — I5022 Chronic systolic (congestive) heart failure: Secondary | ICD-10-CM

## 2016-08-15 DIAGNOSIS — R05 Cough: Secondary | ICD-10-CM

## 2016-08-15 MED ORDER — HYDROCODONE-HOMATROPINE 5-1.5 MG/5ML PO SYRP: 5.0000 mL | ORAL_SOLUTION | Freq: Three times a day (TID) | ORAL | 0 refills | Status: DC | PRN

## 2016-08-15 NOTE — Patient Instructions (Signed)
Cough, Adult Coughing is a reflex that clears your throat and your airways. Coughing helps to heal and protect your lungs. It is normal to cough occasionally, but a cough that happens with other symptoms or lasts a long time may be a sign of a condition that needs treatment. A cough may last only 2-3 weeks (acute), or it may last longer than 8 weeks (chronic). What are the causes? Coughing is commonly caused by:  Breathing in substances that irritate your lungs.  A viral or bacterial respiratory infection.  Allergies.  Asthma.  Postnasal drip.  Smoking.  Acid backing up from the stomach into the esophagus (gastroesophageal reflux).  Certain medicines.  Chronic lung problems, including COPD (or rarely, lung cancer).  Other medical conditions such as heart failure.  Follow these instructions at home: Pay attention to any changes in your symptoms. Take these actions to help with your discomfort:  Take medicines only as told by your health care provider. ? If you were prescribed an antibiotic medicine, take it as told by your health care provider. Do not stop taking the antibiotic even if you start to feel better. ? Talk with your health care provider before you take a cough suppressant medicine.  Drink enough fluid to keep your urine clear or pale yellow.  If the air is dry, use a cold steam vaporizer or humidifier in your bedroom or your home to help loosen secretions.  Avoid anything that causes you to cough at work or at home.  If your cough is worse at night, try sleeping in a semi-upright position.  Avoid cigarette smoke. If you smoke, quit smoking. If you need help quitting, ask your health care provider.  Avoid caffeine.  Avoid alcohol.  Rest as needed.  Contact a health care provider if:  You have new symptoms.  You cough up pus.  Your cough does not get better after 2-3 weeks, or your cough gets worse.  You cannot control your cough with suppressant  medicines and you are losing sleep.  You develop pain that is getting worse or pain that is not controlled with pain medicines.  You have a fever.  You have unexplained weight loss.  You have night sweats. Get help right away if:  You cough up blood.  You have difficulty breathing.  Your heartbeat is very fast. This information is not intended to replace advice given to you by your health care provider. Make sure you discuss any questions you have with your health care provider. Document Released: 10/13/2010 Document Revised: 09/22/2015 Document Reviewed: 06/23/2014 Elsevier Interactive Patient Education  2017 [employer]  

## 2016-08-15 NOTE — Progress Notes (Signed)
Subjective:  Patient ID: Russell [name], male    DOB: 09-20-1923  Age: 83 y.o. MRN: [id number]  CC: Cough; Hypertension; and Congestive Heart Failure   HPI Drury Ardizzone presents for f/up after a recent admission for GI symptoms suspicious for ileus and dehydration. His appetite has been okay and he has had no more episodes of nausea, vomiting, or abdominal pain. He has had a mild intermittent nonproductive cough. His daughter requests a cough suppressant. He is still under the services of hospice.  Outpatient Medications Prior to Visit  Medication Sig Dispense Refill  . ALPRAZolam (XANAX) 0.5 MG tablet TAKE 1 TABLET BY MOUTH THREE TIMES DAILY AS NEEDED FOR ANXIETY 60 tablet 5  . aspirin 81 MG tablet Take 81 mg by mouth daily.      . Azelastine-Fluticasone 137-50 MCG/ACT SUSP Place 1 spray into the nose 2 (two) times daily as needed (allergies).     . carvedilol (COREG) 3.125 MG tablet TAKE 1 TABLET(3.125 MG) BY MOUTH TWICE DAILY WITH A MEAL 60 tablet 11  . Cyanocobalamin (VITAMIN B-12 PO) Take 1 tablet by mouth daily with breakfast.    . divalproex (DEPAKOTE ER) 250 MG 24 hr tablet Take 1 tablet (250 mg total) by mouth daily. 30 tablet 11  . Elastic Bandages & Supports (HERNIA BELT DOUBLE LARGE) MISC Use as needed for Hernia pain. 1 each 0  . fesoterodine (TOVIAZ) 8 MG TB24 tablet Take 1 tablet (8 mg total) by mouth daily. 30 tablet 11  . HYDROcodone-acetaminophen (NORCO/VICODIN) 5-325 MG tablet Take 1 tablet by mouth every 6 (six) hours as needed for moderate pain. 90 tablet 0  . isosorbide-hydrALAZINE (BIDIL) 20-37.5 MG tablet Take 1 tablet by mouth 3 (three) times daily. 90 tablet 3  . pantoprazole (PROTONIX) 40 MG tablet Take 1 tablet (40 mg total) by mouth 2 (two) times daily. 60 tablet 0  . polyethylene glycol (MIRALAX / GLYCOLAX) packet Take 17 g by mouth daily. (Patient taking differently: Take 17 g by mouth daily as needed (constipation). ) 14 each 5  . potassium chloride SA  (K-DUR,KLOR-CON) 20 MEQ tablet Take 1 tablet (20 mEq total) by mouth daily. 90 tablet 3  . prochlorperazine (COMPAZINE) 10 MG tablet Take 1 tablet (10 mg total) by mouth every 8 (eight) hours as needed for nausea or vomiting. 90 tablet 1  . SENEXON-S 8.6-50 MG tablet TK 1 T PO QD  1  . sertraline (ZOLOFT) 100 MG tablet TAKE 1 TABLET BY MOUTH DAILY 90 tablet 3  . torsemide (DEMADEX) 20 MG tablet TAKE 1 TABLET(20 MG) BY MOUTH DAILY 90 tablet 1  . simvastatin (ZOCOR) 80 MG tablet Take 40 mg by mouth daily at 6 PM.   3   No facility-administered medications prior to visit.     ROS Review of Systems  Constitutional: Positive for activity change, appetite change and fatigue. Negative for chills, diaphoresis, fever and unexpected weight change.  HENT: Negative.  Negative for trouble swallowing.   Eyes: Negative for visual disturbance.  Respiratory: Positive for cough. Negative for chest tightness, shortness of breath and wheezing.   Cardiovascular: Negative.  Negative for chest pain, palpitations and leg swelling.  Gastrointestinal: Negative for abdominal pain, constipation, diarrhea, nausea and vomiting.  Endocrine: Negative.   Genitourinary: Negative.  Negative for difficulty urinating.  Musculoskeletal: Positive for arthralgias. Negative for back pain, myalgias and neck pain.  Skin: Negative.   Allergic/Immunologic: Negative.   Neurological: Negative.   Hematological: Negative for adenopathy. Does not bruise/bleed  easily.    Objective:  BP (!) 104/44 (BP Location: Left Arm, Patient Position: Sitting, Cuff Size: Normal)   Pulse 77   Temp 97.7 F (36.5 C) (Oral)   Resp 16   Ht 5\' 5"  (1.651 m)   Wt 134 lb 4 oz (60.9 kg)   SpO2 97%   BMI 22.34 kg/m   BP Readings from Last 3 Encounters:  08/15/16 (!) 104/44  08/04/16 (!) 105/58  08/01/16 124/62    Wt Readings from Last 3 Encounters:  08/15/16 134 lb 4 oz (60.9 kg)  08/01/16 135 lb 2.3 oz (61.3 kg)  08/01/16 137 lb 12.8 oz (62.5  kg)    Physical Exam  Constitutional: He is oriented to person, place, and time. No distress.  Frail elderly male Wheelchair-bound  HENT:  Mouth/Throat: Oropharynx is clear and moist. No oropharyngeal exudate.  Eyes: Conjunctivae are normal. Right eye exhibits no discharge. Left eye exhibits no discharge. No scleral icterus.  Neck: Normal range of motion. Neck supple. No JVD present. No tracheal deviation present. No thyromegaly present.  Cardiovascular: Normal rate, regular rhythm, normal heart sounds and intact distal pulses.  Exam reveals no gallop and no friction rub.   No murmur heard. Pulmonary/Chest: Effort normal and breath sounds normal. No stridor. No respiratory distress. He has no wheezes. He has no rales. He exhibits no tenderness.  Abdominal: Soft. Bowel sounds are normal. He exhibits no distension and no mass. There is no tenderness. There is no rebound and no guarding.  Musculoskeletal: Normal range of motion. He exhibits no edema, tenderness or deformity.  Lymphadenopathy:    He has no cervical adenopathy.  Neurological: He is oriented to person, place, and time.  Skin: Skin is warm and dry. No rash noted. He is not diaphoretic. No erythema. No pallor.  Vitals reviewed.   Lab Results  Component Value Date   WBC 6.4 08/04/2016   HGB 10.3 (L) 08/04/2016   HCT 29.7 (L) 08/04/2016   PLT 192 08/04/2016   GLUCOSE 93 08/04/2016   CHOL 124 01/25/2015   TRIG 74.0 01/25/2015   HDL 46.50 01/25/2015   LDLCALC 63 01/25/2015   ALT 7 08/01/2016   AST 18 08/01/2016   NA 139 08/04/2016   K 3.5 08/04/2016   CL 107 08/04/2016   CREATININE 1.11 08/04/2016   BUN 25 (H) 08/04/2016   CO2 26 08/04/2016   TSH 1.86 01/25/2015   PSA 1.94 02/20/2013   INR 1.22 12/17/2015   HGBA1C 5.4 12/17/2015   MICROALBUR 2.2 (H) 01/25/2015    Dg Abd 2 Views  Result Date: 08/02/2016 CLINICAL DATA:  Coffee-ground emesis. EXAM: ABDOMEN - 2 VIEW COMPARISON:  Abdominal CT 03/09/2014 FINDINGS:  Normal bowel gas pattern. No visualized pneumoperitoneum. Bowel sutures over the right abdomen. Known right renal calculus measuring 11 mm. Lung bases are clear when allowing for a nipple shadow on the right. Confluent osteophytes with ankylosis. IMPRESSION: 1. Normal bowel gas pattern.  No evidence of pneumoperitoneum. 2. Right nephrolithiasis. 3. Spondyloarthropathy with diffuse ankylosis. Electronically Signed   By: Monte Fantasia M.D.   On: 08/02/2016 09:20   Dg Abd Acute W/chest  Result Date: 08/01/2016 CLINICAL DATA:  Weight loss, vomiting for 1 week, cough EXAM: DG ABDOMEN ACUTE W/ 1V CHEST COMPARISON:  01/12/2016 FINDINGS: Cardiomediastinal silhouette is stable. No infiltrate or pleural effusion. No pulmonary edema. Mild gaseous distended small bowel loops in left upper abdomen. Mild ileus or enteritis cannot be excluded. Moderate stool are noted in right colon.  Some colonic stool and gas noted distal transverse colon and proximal left colon. Moderate stool noted within rectum. Degenerative changes lumbar spine. No evidence of free abdominal air. IMPRESSION: No acute disease within chest. Mild gaseous distended small bowel loops in left upper abdomen. Mild ileus or enteritis cannot be excluded. Colonic stool and gas as described. No evidence of free abdominal air. Electronically Signed   By: Lahoma Crocker M.D.   On: 08/01/2016 11:26    Assessment & Plan:   Larson was seen today for cough, hypertension and congestive heart failure.  Diagnoses and all orders for this visit:  Cough- I don't think it's appropriate to investigate the cough, we'll offer symptom relief with Hycodan -     HYDROcodone-homatropine (HYCODAN) 5-1.5 MG/5ML syrup; Take 5 mLs by mouth every 8 (eight) hours as needed for cough.  Chronic systolic CHF (congestive heart failure) ([place])- he has a normal volume status today, will continue to diurese with torsemide and will continue carvedilol, isosorbide dinitrate and  hydralazine.  Essential hypertension, benign- his blood pressure is adequately well controlled   I have discontinued Mr. Emberton simvastatin. I am also having him start on HYDROcodone-homatropine. Additionally, I am having him maintain his aspirin, fesoterodine, Cyanocobalamin (VITAMIN B-12 PO), Azelastine-Fluticasone, divalproex, ALPRAZolam, potassium chloride SA, polyethylene glycol, sertraline, carvedilol, isosorbide-hydrALAZINE, torsemide, Hernia Belt Double Large, prochlorperazine, HYDROcodone-acetaminophen, SENEXON-S, and pantoprazole.  Meds ordered this encounter  Medications  . HYDROcodone-homatropine (HYCODAN) 5-1.5 MG/5ML syrup    Sig: Take 5 mLs by mouth every 8 (eight) hours as needed for cough.    Dispense:  120 mL    Refill:  0     Follow-up: No Follow-up on file.  Scarlette Calico, MD

## 2016-08-15 NOTE — Progress Notes (Signed)
Pre visit review using our clinic review tool, if applicable. No additional management support is needed unless otherwise documented below in the visit note. 

## 2016-08-23 ENCOUNTER — Telehealth: Payer: Self-pay | Admitting: Internal Medicine

## 2016-08-23 NOTE — Telephone Encounter (Signed)
Wants permission to order hernia belt for the Pt.   LVM with Clinical manager, there is 3 of them.

## 2016-08-24 NOTE — Telephone Encounter (Signed)
Printed order given to PCP to sign.

## 2016-08-27 NOTE — Telephone Encounter (Signed)
Order faxed to hospice at [phone]

## 2016-09-03 ENCOUNTER — Telehealth: Payer: Self-pay

## 2016-09-03 ENCOUNTER — Telehealth: Payer: Self-pay | Admitting: Internal Medicine

## 2016-09-03 DIAGNOSIS — K439 Ventral hernia without obstruction or gangrene: Secondary | ICD-10-CM

## 2016-09-03 DIAGNOSIS — I519 Heart disease, unspecified: Secondary | ICD-10-CM

## 2016-09-03 MED ORDER — HERNIA BELT DOUBLE LARGE MISC: 0 refills | Status: DC

## 2016-09-03 MED ORDER — PROCHLORPERAZINE MALEATE 10 MG PO TABS
10.0000 mg | ORAL_TABLET | ORAL | 1 refills | Status: DC
Start: 2016-09-03 — End: 2016-11-25

## 2016-09-03 NOTE — Telephone Encounter (Signed)
Home health called and stated pt has not received hernia belt. They asked for [place] to send to [facility] on [address]. RX sent

## 2016-09-03 NOTE — Telephone Encounter (Signed)
Called Vickie and gave okay to DC meds as stated.  Vickie stated that they are giving the compazine 10 mg q4h for pt nausea.   Asked Vickie to verify that pt is not getting the hycodan syrup and hydrocodone table. Vickie will verify and get back with [place].

## 2016-09-03 NOTE — Telephone Encounter (Signed)
Yes, they can stop all of those meds  What are they giving him for nausea?

## 2016-09-03 NOTE — Telephone Encounter (Signed)
Vickie from Hospice called stating that the pt has been experiencing spells of nausea and vomiting. She asked if they could discontinue the Simvastatin, Azelastine-Fluticasone, and Protonix. She also asked if there was anything else that he could stop taking that might help resolve these side effects.

## 2016-09-04 ENCOUNTER — Ambulatory Visit: Payer: PPO | Admitting: Podiatry

## 2016-09-05 ENCOUNTER — Ambulatory Visit: Payer: Self-pay | Admitting: Neurology

## 2016-09-07 ENCOUNTER — Other Ambulatory Visit: Payer: Self-pay | Admitting: Internal Medicine

## 2016-09-18 DIAGNOSIS — K2210 Ulcer of esophagus without bleeding: Secondary | ICD-10-CM | POA: Diagnosis not present

## 2016-09-25 ENCOUNTER — Other Ambulatory Visit: Payer: Self-pay | Admitting: Internal Medicine

## 2016-09-26 ENCOUNTER — Encounter: Payer: Self-pay | Admitting: Podiatry

## 2016-09-26 ENCOUNTER — Ambulatory Visit (INDEPENDENT_AMBULATORY_CARE_PROVIDER_SITE_OTHER): Payer: PPO | Admitting: Podiatry

## 2016-09-26 VITALS — BP 96/51 | HR 73 | Resp 18

## 2016-09-26 DIAGNOSIS — M79674 Pain in right toe(s): Secondary | ICD-10-CM | POA: Diagnosis not present

## 2016-09-26 DIAGNOSIS — M79675 Pain in left toe(s): Secondary | ICD-10-CM | POA: Diagnosis not present

## 2016-09-26 DIAGNOSIS — B351 Tinea unguium: Secondary | ICD-10-CM | POA: Diagnosis not present

## 2016-09-26 DIAGNOSIS — E1142 Type 2 diabetes mellitus with diabetic polyneuropathy: Secondary | ICD-10-CM | POA: Diagnosis not present

## 2016-09-26 NOTE — Telephone Encounter (Signed)
rx faxed to [facility].  

## 2016-09-26 NOTE — Progress Notes (Signed)
Patient ID: Russell [name], male   DOB: 01-Oct-1923, 83 y.o.   MRN: [id number]    Subjective: This diabetic patient presents for a scheduled visit requesting debridement of mycotic toenails. The patient's caregivers present to treatment room  Objective: Patient appears orientated 3 Patient transfers from wheelchair to treatment chair DP and PT pulses 2/4 bilaterally Sensation to 10 g monofilament wire intact 0/5 bilaterally Vibratory sensation nonreactive bilaterally Ankle reflexes weakly reactive bilaterally Hammertoe second bilaterally Slow gait requiring cane No open skin lesions noted bilaterally Atrophic dry skin bilaterally Mild distal keratoses hallux bilaterally The toenails are elongated, brittle, deformed, hypertrophic   Assessment: Diabetic neuropathy Mycotic toenails 6-10  Plan: Debridement of toenails 6-10 mechanically and electronically with slight bleeding distal left hallux treated with topical antibiotic ointment and Band-Aid. Patient instructed to remove Band-Aid 1-3 days and continue to apply topical antibiotic ointment and a Band-Aid daily until a scab forms  Reappoint 3 months

## 2016-09-26 NOTE — Patient Instructions (Signed)
Removed Band-Aid on left great toe 1-3 days and apply topical antibiotic ointment and Band-Aid daily until a scab forms  Diabetes and Foot Care Diabetes may cause you to have problems because of poor blood supply (circulation) to your feet and legs. This may cause the skin on your feet to become thinner, break easier, and heal more slowly. Your skin may become dry, and the skin may peel and crack. You may also have nerve damage in your legs and feet causing decreased feeling in them. You may not notice minor injuries to your feet that could lead to infections or more serious problems. Taking care of your feet is one of the most important things you can do for yourself. Follow these instructions at home:  Wear shoes at all times, even in the house. Do not go barefoot. Bare feet are easily injured.  Check your feet daily for blisters, cuts, and redness. If you cannot see the bottom of your feet, use a mirror or ask someone for help.  Wash your feet with warm water (do not use hot water) and mild soap. Then pat your feet and the areas between your toes until they are completely dry. Do not soak your feet as this can dry your skin.  Apply a moisturizing lotion or petroleum jelly (that does not contain alcohol and is unscented) to the skin on your feet and to dry, brittle toenails. Do not apply lotion between your toes.  Trim your toenails straight across. Do not dig under them or around the cuticle. File the edges of your nails with an emery board or nail file.  Do not cut corns or calluses or try to remove them with medicine.  Wear clean socks or stockings every day. Make sure they are not too tight. Do not wear knee-high stockings since they may decrease blood flow to your legs.  Wear shoes that fit properly and have enough cushioning. To break in new shoes, wear them for just a few hours a day. This prevents you from injuring your feet. Always look in your shoes before you put them on to be sure  there are no objects inside.  Do not cross your legs. This may decrease the blood flow to your feet.  If you find a minor scrape, cut, or break in the skin on your feet, keep it and the skin around it clean and dry. These areas may be cleansed with mild soap and water. Do not cleanse the area with peroxide, alcohol, or iodine.  When you remove an adhesive bandage, be sure not to damage the skin around it.  If you have a wound, look at it several times a day to make sure it is healing.  Do not use heating pads or hot water bottles. They may burn your skin. If you have lost feeling in your feet or legs, you may not know it is happening until it is too late.  Make sure your health care provider performs a complete foot exam at least annually or more often if you have foot problems. Report any cuts, sores, or bruises to your health care provider immediately. Contact a health care provider if:  You have an injury that is not healing.  You have cuts or breaks in the skin.  You have an ingrown nail.  You notice redness on your legs or feet.  You feel burning or tingling in your legs or feet.  You have pain or cramps in your legs and feet.  Your legs or feet are numb.  Your feet always feel cold. Get help right away if:  There is increasing redness, swelling, or pain in or around a wound.  There is a red line that goes up your leg.  Pus is coming from a wound.  You develop a fever or as directed by your health care provider.  You notice a bad smell coming from an ulcer or wound. This information is not intended to replace advice given to you by your health care provider. Make sure you discuss any questions you have with your health care provider. Document Released: 04/13/2000 Document Revised: 09/22/2015 Document Reviewed: 09/23/2012 Elsevier Interactive Patient Education  2017 [employer].

## 2016-09-28 ENCOUNTER — Telehealth: Payer: Self-pay | Admitting: Internal Medicine

## 2016-09-28 NOTE — Telephone Encounter (Signed)
Routing to dr jones, please advise, thanks 

## 2016-09-28 NOTE — Telephone Encounter (Signed)
Hospice MD recommended a hernia belt.  Family does not like as needed.  Would like parameters from Dr. Ronnald Ramp.  Patient is on  bidle (hospice does not cover) Can change to isosorbide dinitrate?   Also on torsemide.  Requesting this to be changed to hydralazine?

## 2016-10-01 ENCOUNTER — Other Ambulatory Visit: Payer: Self-pay | Admitting: Internal Medicine

## 2016-10-01 ENCOUNTER — Telehealth: Payer: Self-pay | Admitting: Internal Medicine

## 2016-10-01 DIAGNOSIS — I5022 Chronic systolic (congestive) heart failure: Secondary | ICD-10-CM

## 2016-10-01 MED ORDER — ISOSORBIDE MONONITRATE 20 MG PO TABS: 20.0000 mg | ORAL_TABLET | Freq: Three times a day (TID) | ORAL | 11 refills | Status: DC

## 2016-10-01 MED ORDER — HYDRALAZINE HCL 25 MG PO TABS
25.0000 mg | ORAL_TABLET | Freq: Three times a day (TID) | ORAL | 11 refills | Status: DC
Start: 2016-10-01 — End: 2016-11-21

## 2016-10-01 NOTE — Telephone Encounter (Signed)
I tried to call hospice number noted on this message and they referred me to [phone]---I called and left message at that number for Russell [name]/hospice to call me back

## 2016-10-01 NOTE — Telephone Encounter (Signed)
bidil changed to generic I don't understand the question about torsemide The family will have to decide how to manage the hernia belt

## 2016-10-01 NOTE — Telephone Encounter (Signed)
Called Vickie regarding -  Torsemide is not covered by Hospice any more. Request to change to hydralazine 25 mg tab 1.5 tab po tid. Please advise -   Recommendation for Hernia belt to be worn through out the day and at night as needed for pain. Verbal okay given for the hernia belt.

## 2016-10-01 NOTE — Telephone Encounter (Signed)
States patient got script for isosorbide mononitrate.  States this is not correct.  Did not want to leave detailed message.  Is requesting call back in regard.

## 2016-10-02 ENCOUNTER — Other Ambulatory Visit: Payer: Self-pay | Admitting: Internal Medicine

## 2016-10-02 DIAGNOSIS — I5022 Chronic systolic (congestive) heart failure: Secondary | ICD-10-CM

## 2016-10-02 MED ORDER — ISOSORBIDE DINITRATE 20 MG PO TABS: 20.0000 mg | ORAL_TABLET | Freq: Three times a day (TID) | ORAL | 11 refills | Status: DC

## 2016-10-02 NOTE — Telephone Encounter (Signed)
Changed, yes

## 2016-10-02 NOTE — Telephone Encounter (Signed)
Called Russell [name] with Hospice.  bidel substitution is isosorbide dinitrate 20 mg po tid instead of the mononitrate.   Can hydralazine be written for 1.5 tabs po tid.  Please advise if okay to change.

## 2016-10-04 NOTE — Telephone Encounter (Signed)
Left message with Abigail Butts stated that MD has changed the medications as requested.

## 2016-10-05 NOTE — Telephone Encounter (Signed)
Jocelyn Lamer  [phone] She has concerns about pt BP numbers and meds.  She would like nurse to call her about this

## 2016-10-09 NOTE — Telephone Encounter (Signed)
Russell [name] needs hydralazine 1 tab 25mg  tabs needs to be change to 1.5 tabs 3x per day to match [place] Nurse [phone] if cannot reach call [phone] ask to administrator

## 2016-10-09 NOTE — Telephone Encounter (Signed)
Spoke to [place] and she wanted to confirm that the Bidil was changed to the 2 generic alternatives. Confirmed the changes were made and sent to the pharmacy on 10/01/2016. Also confirmed the hernia belt instructions

## 2016-10-14 ENCOUNTER — Inpatient Hospital Stay (HOSPITAL_COMMUNITY)
Admission: EM | Admit: 2016-10-14 | Discharge: 2016-10-17 | DRG: 193 | Disposition: A | Discharge status: Hospice | Payer: PPO | Attending: Internal Medicine | Admitting: Internal Medicine

## 2016-10-14 ENCOUNTER — Encounter (HOSPITAL_COMMUNITY): Payer: Self-pay

## 2016-10-14 ENCOUNTER — Emergency Department (HOSPITAL_COMMUNITY): Payer: PPO

## 2016-10-14 DIAGNOSIS — E875 Hyperkalemia: Secondary | ICD-10-CM | POA: Diagnosis present

## 2016-10-14 DIAGNOSIS — I10 Essential (primary) hypertension: Secondary | ICD-10-CM

## 2016-10-14 DIAGNOSIS — N183 Chronic kidney disease, stage 3 (moderate): Secondary | ICD-10-CM | POA: Diagnosis not present

## 2016-10-14 DIAGNOSIS — F0391 Unspecified dementia with behavioral disturbance: Secondary | ICD-10-CM | POA: Diagnosis present

## 2016-10-14 DIAGNOSIS — I249 Acute ischemic heart disease, unspecified: Secondary | ICD-10-CM

## 2016-10-14 DIAGNOSIS — R627 Adult failure to thrive: Secondary | ICD-10-CM | POA: Diagnosis present

## 2016-10-14 DIAGNOSIS — Z8546 Personal history of malignant neoplasm of prostate: Secondary | ICD-10-CM

## 2016-10-14 DIAGNOSIS — I5022 Chronic systolic (congestive) heart failure: Secondary | ICD-10-CM | POA: Diagnosis present

## 2016-10-14 DIAGNOSIS — H9190 Unspecified hearing loss, unspecified ear: Secondary | ICD-10-CM | POA: Diagnosis not present

## 2016-10-14 DIAGNOSIS — E785 Hyperlipidemia, unspecified: Secondary | ICD-10-CM | POA: Diagnosis present

## 2016-10-14 DIAGNOSIS — N179 Acute kidney failure, unspecified: Secondary | ICD-10-CM | POA: Diagnosis present

## 2016-10-14 DIAGNOSIS — R0902 Hypoxemia: Secondary | ICD-10-CM

## 2016-10-14 DIAGNOSIS — N50819 Testicular pain, unspecified: Secondary | ICD-10-CM | POA: Diagnosis not present

## 2016-10-14 DIAGNOSIS — E860 Dehydration: Secondary | ICD-10-CM | POA: Diagnosis present

## 2016-10-14 DIAGNOSIS — I130 Hypertensive heart and chronic kidney disease with heart failure and stage 1 through stage 4 chronic kidney disease, or unspecified chronic kidney disease: Secondary | ICD-10-CM | POA: Diagnosis present

## 2016-10-14 DIAGNOSIS — R008 Other abnormalities of heart beat: Secondary | ICD-10-CM | POA: Diagnosis present

## 2016-10-14 DIAGNOSIS — J189 Pneumonia, unspecified organism: Secondary | ICD-10-CM | POA: Diagnosis not present

## 2016-10-14 DIAGNOSIS — Z8701 Personal history of pneumonia (recurrent): Secondary | ICD-10-CM

## 2016-10-14 DIAGNOSIS — R778 Other specified abnormalities of plasma proteins: Secondary | ICD-10-CM | POA: Diagnosis present

## 2016-10-14 DIAGNOSIS — E876 Hypokalemia: Secondary | ICD-10-CM | POA: Diagnosis not present

## 2016-10-14 DIAGNOSIS — R7989 Other specified abnormal findings of blood chemistry: Secondary | ICD-10-CM | POA: Diagnosis not present

## 2016-10-14 DIAGNOSIS — F03B18 Unspecified dementia, moderate, with other behavioral disturbance: Secondary | ICD-10-CM | POA: Diagnosis present

## 2016-10-14 DIAGNOSIS — I248 Other forms of acute ischemic heart disease: Secondary | ICD-10-CM | POA: Diagnosis not present

## 2016-10-14 DIAGNOSIS — Z923 Personal history of irradiation: Secondary | ICD-10-CM

## 2016-10-14 DIAGNOSIS — Y95 Nosocomial condition: Secondary | ICD-10-CM | POA: Diagnosis not present

## 2016-10-14 DIAGNOSIS — F418 Other specified anxiety disorders: Secondary | ICD-10-CM | POA: Diagnosis present

## 2016-10-14 DIAGNOSIS — Z85038 Personal history of other malignant neoplasm of large intestine: Secondary | ICD-10-CM

## 2016-10-14 DIAGNOSIS — D649 Anemia, unspecified: Secondary | ICD-10-CM | POA: Diagnosis present

## 2016-10-14 DIAGNOSIS — R748 Abnormal levels of other serum enzymes: Secondary | ICD-10-CM | POA: Diagnosis not present

## 2016-10-14 DIAGNOSIS — R069 Unspecified abnormalities of breathing: Secondary | ICD-10-CM | POA: Diagnosis not present

## 2016-10-14 DIAGNOSIS — Z8719 Personal history of other diseases of the digestive system: Secondary | ICD-10-CM

## 2016-10-14 DIAGNOSIS — E119 Type 2 diabetes mellitus without complications: Secondary | ICD-10-CM | POA: Diagnosis present

## 2016-10-14 DIAGNOSIS — M19042 Primary osteoarthritis, left hand: Secondary | ICD-10-CM | POA: Diagnosis present

## 2016-10-14 DIAGNOSIS — M19041 Primary osteoarthritis, right hand: Secondary | ICD-10-CM | POA: Diagnosis present

## 2016-10-14 DIAGNOSIS — Z66 Do not resuscitate: Secondary | ICD-10-CM | POA: Diagnosis not present

## 2016-10-14 DIAGNOSIS — E538 Deficiency of other specified B group vitamins: Secondary | ICD-10-CM | POA: Diagnosis not present

## 2016-10-14 DIAGNOSIS — J690 Pneumonitis due to inhalation of food and vomit: Secondary | ICD-10-CM | POA: Diagnosis not present

## 2016-10-14 DIAGNOSIS — Z79899 Other long term (current) drug therapy: Secondary | ICD-10-CM

## 2016-10-14 DIAGNOSIS — Z87442 Personal history of urinary calculi: Secondary | ICD-10-CM

## 2016-10-14 DIAGNOSIS — J9601 Acute respiratory failure with hypoxia: Secondary | ICD-10-CM | POA: Diagnosis present

## 2016-10-14 DIAGNOSIS — E1122 Type 2 diabetes mellitus with diabetic chronic kidney disease: Secondary | ICD-10-CM | POA: Diagnosis not present

## 2016-10-14 DIAGNOSIS — E861 Hypovolemia: Secondary | ICD-10-CM | POA: Diagnosis present

## 2016-10-14 DIAGNOSIS — Z7982 Long term (current) use of aspirin: Secondary | ICD-10-CM

## 2016-10-14 DIAGNOSIS — R4182 Altered mental status, unspecified: Secondary | ICD-10-CM | POA: Diagnosis not present

## 2016-10-14 DIAGNOSIS — Z87891 Personal history of nicotine dependence: Secondary | ICD-10-CM | POA: Diagnosis not present

## 2016-10-14 LAB — COMPREHENSIVE METABOLIC PANEL
ALBUMIN: 2.9 g/dL — AB (ref 3.5–5.0)
ALK PHOS: 56 U/L (ref 38–126)
ALT: 27 U/L (ref 17–63)
ANION GAP: 10 (ref 5–15)
AST: 70 U/L — ABNORMAL HIGH (ref 15–41)
BILIRUBIN TOTAL: 0.5 mg/dL (ref 0.3–1.2)
BUN: 56 mg/dL — ABNORMAL HIGH (ref 6–20)
CO2: 26 mmol/L (ref 22–32)
Calcium: 8.8 mg/dL — ABNORMAL LOW (ref 8.9–10.3)
Chloride: 105 mmol/L (ref 101–111)
Creatinine, Ser: 2.4 mg/dL — ABNORMAL HIGH (ref 0.61–1.24)
GFR calc Af Amer: 26 mL/min — ABNORMAL LOW (ref >60)
GFR, EST NON AFRICAN AMERICAN: 22 mL/min — AB (ref >60)
GLUCOSE: 92 mg/dL (ref 65–99)
Potassium: 5.6 mmol/L — ABNORMAL HIGH (ref 3.5–5.1)
Sodium: 141 mmol/L (ref 135–145)
TOTAL PROTEIN: 6.3 g/dL — AB (ref 6.5–8.1)

## 2016-10-14 LAB — URINALYSIS, ROUTINE W REFLEX MICROSCOPIC
Bilirubin Urine: NEGATIVE
GLUCOSE, UA: NEGATIVE mg/dL
Hgb urine dipstick: NEGATIVE
KETONES UR: NEGATIVE mg/dL
LEUKOCYTES UA: NEGATIVE
Nitrite: NEGATIVE
PH: 5 (ref 5.0–8.0)
Protein, ur: NEGATIVE mg/dL
Specific Gravity, Urine: 1.012 (ref 1.005–1.030)

## 2016-10-14 LAB — BRAIN NATRIURETIC PEPTIDE: B Natriuretic Peptide: 1616.6 pg/mL — ABNORMAL HIGH (ref 0.0–100.0)

## 2016-10-14 LAB — I-STAT TROPONIN, ED: Troponin i, poc: 1.01 ng/mL (ref 0.00–0.08)

## 2016-10-14 LAB — CBC WITH DIFFERENTIAL/PLATELET
BASOS PCT: 0 %
Basophils Absolute: 0 10*3/uL (ref 0.0–0.1)
Eosinophils Absolute: 0 10*3/uL (ref 0.0–0.7)
Eosinophils Relative: 0 %
HCT: 37.9 % — ABNORMAL LOW (ref 39.0–52.0)
HEMOGLOBIN: 12.6 g/dL — AB (ref 13.0–17.0)
LYMPHS PCT: 5 %
Lymphs Abs: 0.3 10*3/uL — ABNORMAL LOW (ref 0.7–4.0)
MCH: 30.2 pg (ref 26.0–34.0)
MCHC: 33.2 g/dL (ref 30.0–36.0)
MCV: 90.9 fL (ref 78.0–100.0)
MONOS PCT: 14 %
Monocytes Absolute: 1 10*3/uL (ref 0.1–1.0)
NEUTROS ABS: 5.6 10*3/uL (ref 1.7–7.7)
Neutrophils Relative %: 81 %
Platelets: 175 10*3/uL (ref 150–400)
RBC: 4.17 MIL/uL — ABNORMAL LOW (ref 4.22–5.81)
RDW: 17.2 % — ABNORMAL HIGH (ref 11.5–15.5)
WBC: 6.9 10*3/uL (ref 4.0–10.5)

## 2016-10-14 LAB — CK: CK TOTAL: 267 U/L (ref 49–397)

## 2016-10-14 LAB — MAGNESIUM: MAGNESIUM: 2.1 mg/dL (ref 1.7–2.4)

## 2016-10-14 MED ORDER — HEPARIN (PORCINE) IN NACL 100-0.45 UNIT/ML-% IJ SOLN
900.0000 [IU]/h | INTRAMUSCULAR | Status: DC
  Administered 2016-10-14: 850 [IU]/h via INTRAVENOUS
  Filled 2016-10-14: qty 250

## 2016-10-14 MED ORDER — SODIUM CHLORIDE 0.9 % IV BOLUS (SEPSIS)
1000.0000 mL | Freq: Once | INTRAVENOUS | Status: AC
  Administered 2016-10-14: 1000 mL via INTRAVENOUS

## 2016-10-14 MED ORDER — ASPIRIN 300 MG RE SUPP
300.0000 mg | Freq: Once | RECTAL | Status: AC
  Administered 2016-10-14: 300 mg via RECTAL
  Filled 2016-10-14: qty 1

## 2016-10-14 MED ORDER — HEPARIN BOLUS VIA INFUSION
3000.0000 [IU] | Freq: Once | INTRAVENOUS | Status: AC
  Administered 2016-10-14: 3000 [IU] via INTRAVENOUS
  Filled 2016-10-14: qty 3000

## 2016-10-14 MED ORDER — DEXTROSE 5 % IV SOLN
1.0000 g | INTRAVENOUS | Status: DC
  Administered 2016-10-15: 1 g via INTRAVENOUS
  Filled 2016-10-14: qty 1

## 2016-10-14 MED ORDER — VANCOMYCIN HCL IN DEXTROSE 1-5 GM/200ML-% IV SOLN: 1000.0000 mg | INTRAVENOUS | Status: DC

## 2016-10-14 MED ORDER — DEXTROSE 5 % IV SOLN: 1.0000 g | Freq: Two times a day (BID) | INTRAVENOUS | Status: DC

## 2016-10-14 MED ORDER — PIPERACILLIN-TAZOBACTAM 3.375 G IVPB 30 MIN
3.3750 g | Freq: Once | INTRAVENOUS | Status: AC
  Administered 2016-10-14: 3.375 g via INTRAVENOUS
  Filled 2016-10-14: qty 50

## 2016-10-14 MED ORDER — VANCOMYCIN HCL 10 G IV SOLR
1250.0000 mg | Freq: Once | INTRAVENOUS | Status: AC
  Administered 2016-10-14: 1250 mg via INTRAVENOUS
  Filled 2016-10-14: qty 1250

## 2016-10-14 NOTE — Progress Notes (Signed)
Pharmacy Antibiotic Note  Russell [name] is a 83 y.o. male admitted on 10/14/2016 with pneumonia.  Pharmacy has been consulted for Vancomycin and Cefepime dosing x 8 days. SCr up to 2.4, est CrCl 18 ml/min (baseline 1.2 ~2 mos ago)  Vanc 1250mg  IV and Zosyn 3.375gm IV given in ~1900  Plan: Change Cefepime to 1gm IV q24h x 8 days Vancomycin 1gm IV q48h x 8 days Will f/u micro data, renal function, and pt's clinical condition Vanc trough prn  Height: 5\' 10"  (177.8 cm) Weight: 142 lb (64.4 kg) IBW/kg (Calculated) : 73  Temp (24hrs), Avg:99.9 F (37.7 C), Min:99.9 F (37.7 C), Max:99.9 F (37.7 C)   Recent Labs Lab 10/14/16 1610  WBC 6.9  CREATININE 2.40*    Estimated Creatinine Clearance: 18.3 mL/min (A) (by C-G formula based on SCr of 2.4 mg/dL (H)).    No Known Allergies  Antimicrobials this admission: 6/17 Vanc >>  6/17 Cefepime >>   Dose adjustments this admission: n/a  Microbiology results: 6/17 BCx x2:   Thank you for allowing pharmacy to be a part of this patient's care.  Sherlon Handing, PharmD, BCPS Clinical pharmacist, pager [phone] 10/14/2016 11:36 PM

## 2016-10-14 NOTE — ED Notes (Signed)
hospitalist at the bedside 

## 2016-10-14 NOTE — Progress Notes (Signed)
ANTICOAGULATION CONSULT NOTE - Initial Consult  Pharmacy Consult for heparin Indication: chest pain/ACS  No Known Allergies  Patient Measurements: Height: 5\' 10"  (177.8 cm) Weight: 142 lb (64.4 kg) IBW/kg (Calculated) : 73 Heparin Dosing Weight: 64kg  Vital Signs: Temp: 99.9 F (37.7 C) (06/17 1623) Temp Source: Rectal (06/17 1623) BP: 117/63 (06/17 1815) Pulse Rate: 80 (06/17 1815)  Labs:  Recent Labs  10/14/16 1610  HGB 12.6*  HCT 37.9*  PLT 175  CREATININE 2.40*    Estimated Creatinine Clearance: 18.3 mL/min (A) (by C-G formula based on SCr of 2.4 mg/dL (H)).   Assessment: 89 YOM under care of hospice PTA who presents with shortness of breath and skin being hot to the touch. Troponin is elevated as is BNP. Baseline Hgb 12.6, plts 175.  Goal of Therapy:  Heparin level 0.3-0.7 units/ml Monitor platelets by anticoagulation protocol: Yes   Plan:  Heparin bolus with 3000 units IV x1, then 850 units/hr First heparin level with AM labs Daily heparin level and CBC   Drema Eddington D. Jacqui Headen, PharmD, BCPS Clinical Pharmacist 10/14/2016 6:55 PM

## 2016-10-14 NOTE — ED Provider Notes (Signed)
[facility] DEPT Provider Note   CSN: [id number] Arrival date & time: 10/14/16  1554     History   Chief Complaint Chief Complaint  Patient presents with  . Shortness of Breath  . Altered Mental Status    HPI Russell [name] is a 83 y.o. male.  The history is provided by a relative and a caregiver. The history is limited by the condition of the patient.  Patient is a 83 year old male with past medical history significant for CHF, dementia, currently on hospice, who presents to the emergency department with a 2 day history of altered mental status. 2 days ago had multiple episodes of nausea and vomiting. Nonbloody emesis. Noted worsening altered mental status 2 days ago that has progressively worsened. Generalized weakness, no longer able to walk to the toilet. Denies recent falls or head trauma. Noted increased work of breathing and "loud breathing" today. Patient has not been complaining of anything, has not been talking as much. Denies fever.   Past Medical History:  Diagnosis Date  . Anxiety associated with depression   . Chronic systolic CHF (congestive heart failure) ([place])    a. Echo 8/17: EF 20%, diff HK, ant-septal, ant-lateral inf severe HK, prominent trabeculation noted in LV apex, mod AI, trivial MR, trivial TR, small pericardial effusion // b. Limited Echo 8/17: Diff HK, inf and post AK, apical AK, mild LVH, EF 20-25%, mild AI, mild MR, mild LAE, mod to severe reduced RVSF, small pericardial effusion  . Colon cancer [facility])    s/p resection 12/06  . Dementia   . Diabetes mellitus    diet mgt (11`/12)  . FTT (failure to thrive)    admited 08/2008  . History of cystoscopy    uretheral stricutre, 2005- Dr.Humphreys  . Hyperlipidemia   . Hypertension   . Loss of hearing    being fitted for hearing aids (11/12)  . Osteoarthritis    hands  . Pneumonia   . Prostate cancer ([place])    s/p XRT  . SBO (small bowel obstruction) ([place]) 03/08/2014  . Urolithiasis     Patient  Active Problem List   Diagnosis Date Noted  . AKI (acute kidney injury) ([place]) 10/14/2016  . Anemia 10/14/2016  . Recurrent inguinal hernia of right side without obstruction or gangrene 05/22/2016  . Cachexia associated with cardiac disease 02/01/2016  . Chronic systolic CHF (congestive heart failure) ([place]) 01/01/2016  . Depression with anxiety 01/01/2016  . CKD (chronic kidney disease), stage III 01/01/2016  . Physical deconditioning   . Other seasonal allergic rhinitis 09/29/2015  . Diabetes mellitus type 2, diet-controlled ([place]) 07/19/2015  . Constipation 05/30/2015  . Cough 04/06/2015  . Routine general medical examination at a health care facility 01/27/2015  . Hyperglycemia 01/25/2015  . Moderate dementia with behavioral disturbance 11/05/2014  . DJD (degenerative joint disease) 02/20/2013  . Major depressive disorder, recurrent episode, severe, without mention of psychotic behavior 08/08/2011  . PVC's (premature ventricular contractions) 01/31/2009  . URINARY INCONTINENCE 07/16/2008  . ADENOCARCINOMA, COLON 05/13/2006  . ADENOCARCINOMA, PROSTATE 05/13/2006  . Hyperlipidemia with target LDL less than 100 05/13/2006  . Essential hypertension, benign 05/13/2006    Past Surgical History:  Procedure Laterality Date  . CATARACT EXTRACTION, BILATERAL    . ESOPHAGOGASTRODUODENOSCOPY (EGD) WITH PROPOFOL N/A 08/03/2016   Procedure: ESOPHAGOGASTRODUODENOSCOPY (EGD) WITH PROPOFOL;  Surgeon: Wonda Horner, MD;  Location: [facility] ENDOSCOPY;  Service: Endoscopy;  Laterality: N/A;  . HEMICOLECTOMY  12/06  . TRANSURETHRAL RESECTION OF PROSTATE    .  xrt     ERT for prostate ca       Home Medications    Prior to Admission medications   Medication Sig Start Date End Date Taking? Authorizing Provider  BIDIL 20-37.5 MG tablet Take 1 tablet by mouth 3 (three) times daily. 09/22/16  Yes [provider]  DULCOLAX 10 MG suppository Place 1 suppository rectally daily. Unwrap and insert 1  suppository rectally once daily if not bowel movement  In 3 days 09/17/16  Yes [provider]  HYDROMET 5-1.5 MG/5ML syrup Take 5 mLs by mouth as needed for cough. 10/04/16  Yes [provider]  pantoprazole (PROTONIX) 40 MG tablet Take 40 mg by mouth daily. 08/05/16  Yes [provider]  promethazine-dextromethorphan (PROMETHAZINE-DM) 6.25-15 MG/5ML syrup Take 5 mLs by mouth as needed for cough. 08/17/16  Yes [provider]  simvastatin (ZOCOR) 80 MG tablet Take 40 mg by mouth at bedtime.  09/14/16  Yes [provider]  ALPRAZolam Duanne Moron) 0.5 MG tablet TAKE 1 TABLET BY MOUTH THREE TIMES DAILY AS NEEDED FOR ANXIETY 09/26/16   Janith Lima, MD  aspirin 81 MG tablet Take 81 mg by mouth daily.      [provider]  carvedilol (COREG) 3.125 MG tablet TAKE 1 TABLET(3.125 MG) BY MOUTH TWICE DAILY WITH A MEAL 04/17/16   Janith Lima, MD  Cyanocobalamin (VITAMIN B-12 PO) Take 1 tablet by mouth daily with breakfast.    [provider]  divalproex (DEPAKOTE ER) 250 MG 24 hr tablet Take 1 tablet (250 mg total) by mouth daily. 02/29/16   Cameron Sprang, MD  Elastic Bandages & Supports (HERNIA BELT DOUBLE LARGE) MISC Use as needed for Hernia pain. 09/03/16   Janith Lima, MD  fesoterodine (TOVIAZ) 8 MG TB24 tablet Take 1 tablet (8 mg total) by mouth daily. 05/13/14   Janith Lima, MD  hydrALAZINE (APRESOLINE) 25 MG tablet Take 1 tablet (25 mg total) by mouth 3 (three) times daily. 10/01/16   Janith Lima, MD  HYDROcodone-acetaminophen (NORCO/VICODIN) 5-325 MG tablet Take 1 tablet by mouth every 6 (six) hours as needed for moderate pain. 08/01/16   Janith Lima, MD  isosorbide dinitrate (ISORDIL) 20 MG tablet Take 1 tablet (20 mg total) by mouth 3 (three) times daily. 10/02/16   Janith Lima, MD  polyethylene glycol (MIRALAX / Floria Raveling) packet Take 17 g by mouth daily. Patient taking differently: Take 17 g by mouth daily as needed (constipation).   03/30/16   Janith Lima, MD  potassium chloride SA (K-DUR,KLOR-CON) 20 MEQ tablet Take 1 tablet (20 mEq total) by mouth daily. 03/27/16   Janith Lima, MD  prochlorperazine (COMPAZINE) 10 MG tablet Take 1 tablet (10 mg total) by mouth every 4 (four) hours. Patient taking differently: Take 10 mg by mouth every 8 (eight) hours as needed for nausea or vomiting.  09/03/16   Janith Lima, MD  SENEXON-S 8.6-50 MG tablet Take 2 tablets by mouth every day 07/21/16   [provider]  sertraline (ZOLOFT) 100 MG tablet TAKE 1 TABLET BY MOUTH DAILY Patient taking differently: TAKE 100 mg TABLET BY MOUTH DAILY 04/01/16   Janith Lima, MD  torsemide (DEMADEX) 20 MG tablet TAKE 1 TABLET(20 MG) BY MOUTH DAILY 07/24/16   Janith Lima, MD    Family History Family History  Problem Relation Age of Onset  . Diabetes Daughter   . Cancer Neg Hx   . Early death  Neg Hx   . Heart disease Neg Hx   . Hyperlipidemia Neg Hx   . Hypertension Neg Hx   . Kidney disease Neg Hx   . Stroke Neg Hx   . Heart attack Neg Hx     Social History Social History  Substance Use Topics  . Smoking status: Former Smoker    Quit date: 03/20/1967  . Smokeless tobacco: Never Used  . Alcohol use No     Comment: quit '68     Allergies   Patient has no known allergies.   Review of Systems Review of Systems  Unable to perform ROS: Dementia     Physical Exam Updated Vital Signs BP (!) 115/56   Pulse 71   Temp 99.9 F (37.7 C) (Rectal)   Resp 17   Ht 5\' 10"  (1.778 m)   Wt 64.4 kg (142 lb)   SpO2 100%   BMI 20.37 kg/m   Physical Exam  Constitutional:  Cachectic  HENT:  Head: Atraumatic.  Eyes: EOM are normal. Pupils are equal, round, and reactive to light.  Neck: No JVD present.  Cardiovascular: Regular rhythm, normal heart sounds and intact distal pulses.   No murmur heard. Pulmonary/Chest: He is in respiratory distress.  Course rales throughout, no wheezing. Tachypnea and retractions.    Abdominal: Soft. He exhibits no distension. There is no tenderness. There is no guarding.  Ventral wall hernia  Musculoskeletal: Normal range of motion. He exhibits no edema.  No unilateral leg swelling  Neurological: He is alert.  Oriented to person and place  Skin: Skin is warm. Capillary refill takes less than 2 seconds.     ED Treatments / Results  Labs (all labs ordered are listed, but only abnormal results are displayed) Labs Reviewed  CBC WITH DIFFERENTIAL/PLATELET - Abnormal; Notable for the following:       Result Value   RBC 4.17 (*)    Hemoglobin 12.6 (*)    HCT 37.9 (*)    RDW 17.2 (*)    Lymphs Abs 0.3 (*)    All other components within normal limits  COMPREHENSIVE METABOLIC PANEL - Abnormal; Notable for the following:    Potassium 5.6 (*)    BUN 56 (*)    Creatinine, Ser 2.40 (*)    Calcium 8.8 (*)    Total Protein 6.3 (*)    Albumin 2.9 (*)    AST 70 (*)    GFR calc non Af Amer 22 (*)    GFR calc Af Amer 26 (*)    All other components within normal limits  BRAIN NATRIURETIC PEPTIDE - Abnormal; Notable for the following:    B Natriuretic Peptide 1,616.6 (*)    All other components within normal limits  URINALYSIS, ROUTINE W REFLEX MICROSCOPIC - Abnormal; Notable for the following:    APPearance HAZY (*)    All other components within normal limits  I-STAT TROPOININ, ED - Abnormal; Notable for the following:    Troponin i, poc 1.01 (*)    All other components within normal limits  CULTURE, BLOOD (ROUTINE X 2)  CULTURE, BLOOD (ROUTINE X 2)  HEPARIN LEVEL (UNFRACTIONATED)  CBC  MAGNESIUM  CK    EKG  EKG Interpretation  Date/Time:  Sunday October 14 2016 16:05:05 EDT Ventricular Rate:  86 PR Interval:    QRS Duration: 85 QT Interval:  371 QTC Calculation: 444 R Axis:   69 Text Interpretation:  Sinus rhythm Ventricular trigeminy Nonspecific ST abnormality Confirmed by Ashok Cordia  MD, Lennette Bihari ([id number]) on 10/14/2016 4:46:24 PM       Radiology Dg Chest  Portable 1 View  Result Date: 10/14/2016 CLINICAL DATA:  Initial evaluation for acute dyspnea. Increasing shortness of breath. EXAM: PORTABLE CHEST 1 VIEW COMPARISON:  Prior radiograph from 08/01/2016. FINDINGS: Mild cardiomegaly. Mediastinal silhouette normal. Aortic atherosclerosis. Lungs normally inflated. Mild perihilar vascular congestion with diffuse interstitial prominence, suggesting mild pulmonary interstitial edema. No focal infiltrates. No definite pleural effusion. No pneumothorax. No acute osseus abnormality. IMPRESSION: 1. Cardiomegaly with mild diffuse pulmonary interstitial congestion/edema. 2. No other active cardiopulmonary disease. 3. Aortic atherosclerosis. Electronically Signed   By: Jeannine Boga M.D.   On: 10/14/2016 16:47    Procedures Procedures (including critical care time)  Medications Ordered in ED Medications  heparin ADULT infusion 100 units/mL (25000 units/221mL sodium chloride 0.45%) (850 Units/hr Intravenous New Bag/Given 10/14/16 1924)  aspirin suppository 300 mg (300 mg Rectal Given 10/14/16 1705)  piperacillin-tazobactam (ZOSYN) IVPB 3.375 g (0 g Intravenous Stopped 10/14/16 2008)  vancomycin (VANCOCIN) 1,250 mg in sodium chloride 0.9 % 250 mL IVPB (0 mg Intravenous Stopped 10/14/16 2008)  sodium chloride 0.9 % bolus 1,000 mL (1,000 mLs Intravenous New Bag/Given 10/14/16 1859)  heparin bolus via infusion 3,000 Units (3,000 Units Intravenous Bolus from Bag 10/14/16 1927)     Initial Impression / Assessment and Plan / ED Course  I have reviewed the triage vital signs and the nursing notes.  Pertinent labs & imaging results that were available during my care of the patient were reviewed by me and considered in my medical decision making (see chart for details).     Patient is a 83 year old male with past medical history significant for dementia, heart failure (EF 20%), currently on hospice, who presents to the emergency department with altered mental  status for 2 days now with worsening shortness of breath. Emesis 2 days ago. On arrival ill appearing, cachectic. Unable to give any history. T 99.9, hemodynamically stable. Course rales throughout. On a nonrebreather.  After a long discussion with the patient's family, POA (Sister, Drema Balzarine), pt made DNR/DNI. Code status updated in the compute.   Differential includes ACS, infectious process (most likely aspiration pneumonia/pneumonitis), UTI, CHF exacerbation, electrolyte abnormality. Blood cultures obtained. Lab work remarkable for no leukocytosis. Hemoglobin 12.6 (baseline 10). EKG showed normal sinus rhythm with PVCs, nonspecific ST changes, no signs of acute ischemia. Troponin elevated 1.0. Potassium 5.6. AKI, Cr 2.4 (baseline 1.1-1.6).  UA showed no infectious process. Chest x-ray showed mild interstitial congestion.  Given the patient's recent emesis and worsening respiratory status, most likely aspiration pneumonia has not yet shown up on chest x-ray. Started on broad-spectrum antibiotics. Clinical picture concerning for ACS, given rectal ASA 300 mg and started on heparin infusion.  Recent questionable GI bleed in April, EGD showed esophageal ulcer w/o bleeding. Hgb improved from prior. No anticoagulation or s/s of GI bleeding - feel that benefit of heparin in ACS out weight risk of GI bleed. Patient's family updated on the plan.  Patient admitted to hospitalist for further management.   Final Clinical Impressions(s) / ED Diagnoses   Final diagnoses:  ACS (acute coronary syndrome) ([place])  Hypoxia  Aspiration pneumonia, unspecified aspiration pneumonia type, unspecified laterality, unspecified part of lung [facility])    New Prescriptions New Prescriptions   No medications on file     Nathaniel Man, MD 10/14/16 2120    Lajean Saver, MD 10/14/16 2310

## 2016-10-14 NOTE — ED Triage Notes (Signed)
[facility]- pt coming from home, called out by family for declining mental status for a couple of days as well as increasing shortness of breath. Pt skin hot to touch. Pt is followed by [facility] [place]. Vitals stable with ems. Hx of dementia.

## 2016-10-14 NOTE — ED Notes (Signed)
Family at bedside, EDP aware and will go update family.

## 2016-10-14 NOTE — H&P (Signed)
History and Physical    [name] KCL:[id number] DOB: Mar 11, 1924 DOA: 10/14/2016  PCP: Janith Lima, MD   Patient coming from: Home.  I have personally briefly reviewed patient's old medical records in [place]  Chief Complaint: Altered mental status.  HPI: Russell [name] is a 83 y.o. male with medical history significant of anxiety, depression, chronic systolic CHF, colon cancer, dementia, type 2 diabetes, hyperlipidemia, hypertension, loss of hearing, osteoarthritis, history of pneumonia, prostate cancer, history of SBO, urolithiasis, recently admitted 2 days ago for UGI bleed secondary to esophageal ulcer, who is on home hospice who is brought to the emergency department due to altered mental status, fever and progressively worse shortness of breath for the past 2 days. He is oriented to name, partially oriented to time, he knows that he is in of the hospital, but was unable to tell me in which city he was or time/date/day of the week. He was unable to elaborate on his symptoms.  ED Course: His initial vital signs were temperature 99.79F, pulse 87, respirations 30 and blood pressure 152/64. He was satting 100% on nonrebreather mask, which apparently was started by EMS. The patient received 1000 mL of normal saline bolus, vancomycin and Zosyn in the emergency department after blood cultures 2 were drawn. He was started on heparin infusion due to elevated troponin and EKG with ventricular trigeminy and nonspecific ST abnormality. His mental status after this is mildly improved. He was subsequently able to be titrated to a nasal cannula oxygen.  Workup in the emergency department shows a WBC of 6.9, hemoglobin 12.6 g/dL and platelets 575. Sodium 141, potassium 5.6, chloride 105 and bicarbonate 26 mmol/L. His glucose was 92, BUN was 56 and creatinine 2.4 mg/dL (renal function was 25/1.11 on 08/04/2016). Troponin level was 1.01 ng/mL, BNP 1616 pg/mL and chest radiograph showing  cardiomegaly with mild diffuse pulmonary interstitial congestion/edema.   Review of Systems: As per HPI otherwise 10 point review of systems negative.   Past Medical History:  Diagnosis Date  . Anxiety associated with depression   . Chronic systolic CHF (congestive heart failure) ([place])    a. Echo 8/17: EF 20%, diff HK, ant-septal, ant-lateral inf severe HK, prominent trabeculation noted in LV apex, mod AI, trivial MR, trivial TR, small pericardial effusion // b. Limited Echo 8/17: Diff HK, inf and post AK, apical AK, mild LVH, EF 20-25%, mild AI, mild MR, mild LAE, mod to severe reduced RVSF, small pericardial effusion  . Colon cancer [facility])    s/p resection 12/06  . Dementia   . Diabetes mellitus    diet mgt (11`/12)  . FTT (failure to thrive)    admited 08/2008  . History of cystoscopy    uretheral stricutre, 2005- Dr.Humphreys  . Hyperlipidemia   . Hypertension   . Loss of hearing    being fitted for hearing aids (11/12)  . Osteoarthritis    hands  . Pneumonia   . Prostate cancer ([place])    s/p XRT  . SBO (small bowel obstruction) ([place]) 03/08/2014  . Urolithiasis     Past Surgical History:  Procedure Laterality Date  . CATARACT EXTRACTION, BILATERAL    . ESOPHAGOGASTRODUODENOSCOPY (EGD) WITH PROPOFOL N/A 08/03/2016   Procedure: ESOPHAGOGASTRODUODENOSCOPY (EGD) WITH PROPOFOL;  Surgeon: Wonda Horner, MD;  Location: [facility] ENDOSCOPY;  Service: Endoscopy;  Laterality: N/A;  . HEMICOLECTOMY  12/06  . TRANSURETHRAL RESECTION OF PROSTATE    . xrt     ERT for prostate ca  reports that he quit smoking about 49 years ago. He has never used smokeless tobacco. He reports that he does not drink alcohol or use drugs.  No Known Allergies  Family History  Problem Relation Age of Onset  . Diabetes Daughter   . Cancer Neg Hx   . Early death Neg Hx   . Heart disease Neg Hx   . Hyperlipidemia Neg Hx   . Hypertension Neg Hx   . Kidney disease Neg Hx   . Stroke Neg Hx   . Heart attack  Neg Hx     Prior to Admission medications   Medication Sig Start Date End Date Taking? Authorizing Provider  BIDIL 20-37.5 MG tablet Take 1 tablet by mouth 3 (three) times daily. 09/22/16  Yes [provider]  DULCOLAX 10 MG suppository Place 1 suppository rectally daily. Unwrap and insert 1 suppository rectally once daily if not bowel movement  In 3 days 09/17/16  Yes [provider]  HYDROMET 5-1.5 MG/5ML syrup Take 5 mLs by mouth as needed for cough. 10/04/16  Yes [provider]  pantoprazole (PROTONIX) 40 MG tablet Take 40 mg by mouth daily. 08/05/16  Yes [provider]  promethazine-dextromethorphan (PROMETHAZINE-DM) 6.25-15 MG/5ML syrup Take 5 mLs by mouth as needed for cough. 08/17/16  Yes [provider]  ALPRAZolam Duanne Moron) 0.5 MG tablet TAKE 1 TABLET BY MOUTH THREE TIMES DAILY AS NEEDED FOR ANXIETY 09/26/16   Janith Lima, MD  aspirin 81 MG tablet Take 81 mg by mouth daily.      [provider]  carvedilol (COREG) 3.125 MG tablet TAKE 1 TABLET(3.125 MG) BY MOUTH TWICE DAILY WITH A MEAL 04/17/16   Janith Lima, MD  Cyanocobalamin (VITAMIN B-12 PO) Take 1 tablet by mouth daily with breakfast.    [provider]  divalproex (DEPAKOTE ER) 250 MG 24 hr tablet Take 1 tablet (250 mg total) by mouth daily. 02/29/16   Cameron Sprang, MD  Elastic Bandages & Supports (HERNIA BELT DOUBLE LARGE) MISC Use as needed for Hernia pain. 09/03/16   Janith Lima, MD  fesoterodine (TOVIAZ) 8 MG TB24 tablet Take 1 tablet (8 mg total) by mouth daily. 05/13/14   Janith Lima, MD  hydrALAZINE (APRESOLINE) 25 MG tablet Take 1 tablet (25 mg total) by mouth 3 (three) times daily. 10/01/16   Janith Lima, MD  HYDROcodone-acetaminophen (NORCO/VICODIN) 5-325 MG tablet Take 1 tablet by mouth every 6 (six) hours as needed for moderate pain. 08/01/16   Janith Lima, MD  isosorbide dinitrate (ISORDIL) 20 MG tablet Take 1 tablet (20 mg total) by mouth 3  (three) times daily. 10/02/16   Janith Lima, MD  polyethylene glycol (MIRALAX / Floria Raveling) packet Take 17 g by mouth daily. Patient taking differently: Take 17 g by mouth daily as needed (constipation).  03/30/16   Janith Lima, MD  potassium chloride SA (K-DUR,KLOR-CON) 20 MEQ tablet Take 1 tablet (20 mEq total) by mouth daily. 03/27/16   Janith Lima, MD  prochlorperazine (COMPAZINE) 10 MG tablet Take 1 tablet (10 mg total) by mouth every 4 (four) hours. Patient taking differently: Take 10 mg by mouth every 8 (eight) hours as needed for nausea or vomiting.  09/03/16   Janith Lima, MD  SENEXON-S 8.6-50 MG tablet TK 1 T PO QD 07/21/16   [provider]  sertraline (ZOLOFT) 100 MG tablet TAKE 1 TABLET BY MOUTH DAILY 04/01/16   Janith Lima, MD  torsemide (DEMADEX) 20 MG tablet TAKE 1 TABLET(20 MG) BY MOUTH DAILY 07/24/16   Janith Lima, MD    Physical Exam: Vitals:   10/14/16 1815 10/14/16 1830 10/14/16 1841 10/14/16 1900  BP: 117/63 128/62  (!) 108/53  Pulse: 80 77  74  Resp: (!) 22 (!) 22  19  Temp:      TempSrc:      SpO2: 100% 100%  100%  Weight:   64.4 kg (142 lb)   Height:   5\' 10"  (1.778 m)     Constitutional: NAD, calm, comfortable Eyes: PERRL, lids and conjunctivae normal ENMT: Mucous membranes are dry. Posterior pharynx clear of any exudate or lesions. Neck: normal, supple, no masses, no thyromegaly Respiratory: Decreased breath sounds on bases with bilateral rhonchi, no wheezing. Normal respiratory effort. No accessory muscle use.  Cardiovascular: Regular rate and rhythm, no murmurs / rubs / gallops. No extremity edema. 2+ pedal pulses. No carotid bruits.  Abdomen: Soft, no tenderness, no masses palpated. No hepatosplenomegaly. Bowel sounds positive.  Musculoskeletal: no clubbing / cyanosis. Good ROM, no contractures. Normal muscle tone.  Skin: no significant rashes, lesions, ulcers on limited skin exam Neurologic: Moves all extremities, but does not  follow commands. Unable to fully evaluate.  Psychiatric: Somnolent, oriented to name and partially to place only.    Labs on Admission: I have personally reviewed following labs and imaging studies  CBC:  Recent Labs Lab 10/14/16 1610  WBC 6.9  NEUTROABS 5.6  HGB 12.6*  HCT 37.9*  MCV 90.9  PLT 315   Basic Metabolic Panel:  Recent Labs Lab 10/14/16 1610  NA 141  K 5.6*  CL 105  CO2 26  GLUCOSE 92  BUN 56*  CREATININE 2.40*  CALCIUM 8.8*   GFR: Estimated Creatinine Clearance: 18.3 mL/min (A) (by C-G formula based on SCr of 2.4 mg/dL (H)). Liver Function Tests:  Recent Labs Lab 10/14/16 1610  AST 70*  ALT 27  ALKPHOS 56  BILITOT 0.5  PROT 6.3*  ALBUMIN 2.9*   No results for input(s): LIPASE, AMYLASE in the last 168 hours. No results for input(s): AMMONIA in the last 168 hours. Coagulation Profile: No results for input(s): INR, PROTIME in the last 168 hours. Cardiac Enzymes: No results for input(s): CKTOTAL, CKMB, CKMBINDEX, TROPONINI in the last 168 hours. BNP (last 3 results) No results for input(s): PROBNP in the last 8760 hours. HbA1C: No results for input(s): HGBA1C in the last 72 hours. CBG: No results for input(s): GLUCAP in the last 168 hours. Lipid Profile: No results for input(s): CHOL, HDL, LDLCALC, TRIG, CHOLHDL, LDLDIRECT in the last 72 hours. Thyroid Function Tests: No results for input(s): TSH, T4TOTAL, FREET4, T3FREE, THYROIDAB in the last 72 hours. Anemia Panel: No results for input(s): VITAMINB12, FOLATE, FERRITIN, TIBC, IRON, RETICCTPCT in the last 72 hours. Urine analysis:    Component Value Date/Time   COLORURINE YELLOW 08/01/2016 2053   APPEARANCEUR CLEAR 08/01/2016 2053   LABSPEC 1.019 08/01/2016 2053   PHURINE 5.0 08/01/2016 2053   GLUCOSEU NEGATIVE 08/01/2016 2053   GLUCOSEU NEGATIVE 12/13/2015 1559   HGBUR NEGATIVE 08/01/2016 2053   HGBUR large 08/03/2008 1258   BILIRUBINUR NEGATIVE 08/01/2016 2053   BILIRUBINUR neg  04/09/2014 1056   KETONESUR 5 (A) 08/01/2016 2053   PROTEINUR NEGATIVE 08/01/2016 2053   UROBILINOGEN 0.2 12/13/2015 1559   NITRITE NEGATIVE 08/01/2016 2053   LEUKOCYTESUR NEGATIVE 08/01/2016 2053    Radiological Exams on Admission: Dg Chest Portable 1 View  Result  Date: 10/14/2016 CLINICAL DATA:  Initial evaluation for acute dyspnea. Increasing shortness of breath. EXAM: PORTABLE CHEST 1 VIEW COMPARISON:  Prior radiograph from 08/01/2016. FINDINGS: Mild cardiomegaly. Mediastinal silhouette normal. Aortic atherosclerosis. Lungs normally inflated. Mild perihilar vascular congestion with diffuse interstitial prominence, suggesting mild pulmonary interstitial edema. No focal infiltrates. No definite pleural effusion. No pneumothorax. No acute osseus abnormality. IMPRESSION: 1. Cardiomegaly with mild diffuse pulmonary interstitial congestion/edema. 2. No other active cardiopulmonary disease. 3. Aortic atherosclerosis. Electronically Signed   By: Jeannine Boga M.D.   On: 10/14/2016 16:47   12/22/2015 echocardiogram limited ------------------------------------------------------------------- LV EF: 20% -   25%  ------------------------------------------------------------------- History:   PMH:  Dementia. Colon cancer. Prostate cancer. Rule out apical thrombus.  Risk factors:  Hypertension. Diabetes mellitus. Dyslipidemia.  ------------------------------------------------------------------- Study Conclusions  - Left ventricle: LVEF is severely depressed with diffuse   hypokinesis; mid/distal inferior and posterior akinesis and   apical akinesis. Wall thickness was increased in a pattern of   mild LVH. Systolic function was severely reduced. The estimated   ejection fraction was in the range of 20% to 25%. - Aortic valve: There was mild regurgitation. - Mitral valve: There was mild regurgitation. - Left atrium: The atrium was mildly dilated. - Right ventricle: The cavity size was  mildly dilated. Systolic   function was moderately to severely reduced. - Pericardium, extracardiac: A small pericardial effusion was   identified.   EKG: Independently reviewed. Vent. rate 86 BPM PR interval * ms QRS duration 85 ms QT/QTc 371/444 ms P-R-T axes 49 69 84 Sinus rhythm Ventricular trigeminy Nonspecific ST abnormality   Assessment/Plan Principal Problem:   HCAP (healthcare-associated pneumonia) Suspect possible aspiration. Admit to telemetry as last inpatient. Continue supplemental oxygen. Continue bronchodilators as needed. Cefepime 1 g every 12 hours IVPB. Vancomycin per pharmacy. Check a sputum Gram stain, culture and sensitivity Follow-up blood culture sensitivity. Check strep pneumoniae urinary antigen.  Active Problems:   AKI (acute kidney injury) ([place]) Likely due to dehydration secondary to decreased oral intake. Hold torsemide and potassium supplements. Follow-up BUN, creatinine and electrolytes in a.m.    Hyperkalemia Received a normal saline bolus in the emergency department Potassium supplements have been held. Will continue gentle and time-limited IV hydration. Follow-up potassium level in the morning.    Elevated troponin I level No EKG changes when compared to previous. However, given elevated troponin and he was started on heparin by ED. Could be demand ischemia. Will continue trending troponin level.    Hyperlipidemia with target LDL less than 100 Continue atorvastatin 40 mg by mouth daily. Monitor LFTs and lipid panel.    Essential hypertension, benign Continue carvedilol 3.125 mg by mouth twice a day. Continue hydralazine 25 mg by mouth 3 times a day. The patient is also on Isordil for chronic systolic CHF. Torsemide has been held. Monitor blood pressure.      Diabetes mellitus type 2, diet-controlled (HCC) Carbohydrate modified diet. CBG monitoring with regular insulin sliding scale while in the hospital.    Chronic  systolic CHF (congestive heart failure) ([place]) Compensated at this time. Monitor intake and output. Continue carvedilol, hydralazine, Isordil. Hold furosemide.    Depression with anxiety   Moderate dementia with behavioral disturbance Continue valproic acid 250 by mouth daily. Continue alprazolam 0.5 mg by mouth 3 times a day as needed. Continue sertraline 100 mg by mouth daily.    Anemia Monitor hematocrit and hemoglobin.    DVT prophylaxis: On heparin infusion. Code Status: DO NOT RESUSCITATE/DO NOT INTUBATE. Family  Communication:  Disposition Plan: Admit for IV antibiotics 2-3 days and troponin levels trending. Consults called:  Admission status: Inpatient/telemetry.   Reubin Milan MD [facility] Pager [phone].  If 7PM-7AM, please contact night-coverage [email or web address] Password [facility]  10/14/2016, 8:04 PM

## 2016-10-14 NOTE — ED Notes (Signed)
Attempted second IV stick X2, unsuccessful.

## 2016-10-15 ENCOUNTER — Encounter (HOSPITAL_COMMUNITY): Payer: Self-pay

## 2016-10-15 DIAGNOSIS — E875 Hyperkalemia: Secondary | ICD-10-CM | POA: Diagnosis present

## 2016-10-15 DIAGNOSIS — J189 Pneumonia, unspecified organism: Principal | ICD-10-CM

## 2016-10-15 DIAGNOSIS — F0391 Unspecified dementia with behavioral disturbance: Secondary | ICD-10-CM

## 2016-10-15 DIAGNOSIS — I248 Other forms of acute ischemic heart disease: Secondary | ICD-10-CM

## 2016-10-15 LAB — GLUCOSE, CAPILLARY
GLUCOSE-CAPILLARY: 82 mg/dL (ref 65–99)
GLUCOSE-CAPILLARY: 82 mg/dL (ref 65–99)
Glucose-Capillary: 52 mg/dL — ABNORMAL LOW (ref 65–99)
Glucose-Capillary: 80 mg/dL (ref 65–99)
Glucose-Capillary: 73 mg/dL (ref 65–99)

## 2016-10-15 LAB — COMPREHENSIVE METABOLIC PANEL
ALK PHOS: 43 U/L (ref 38–126)
ALT: 22 U/L (ref 17–63)
ANION GAP: 9 (ref 5–15)
AST: 44 U/L — ABNORMAL HIGH (ref 15–41)
Albumin: 2.5 g/dL — ABNORMAL LOW (ref 3.5–5.0)
BILIRUBIN TOTAL: 0.8 mg/dL (ref 0.3–1.2)
BUN: 60 mg/dL — ABNORMAL HIGH (ref 6–20)
CALCIUM: 8.4 mg/dL — AB (ref 8.9–10.3)
CO2: 26 mmol/L (ref 22–32)
Chloride: 109 mmol/L (ref 101–111)
Creatinine, Ser: 2.11 mg/dL — ABNORMAL HIGH (ref 0.61–1.24)
GFR calc non Af Amer: 26 mL/min — ABNORMAL LOW (ref >60)
GFR, EST AFRICAN AMERICAN: 30 mL/min — AB (ref >60)
Glucose, Bld: 68 mg/dL (ref 65–99)
Potassium: 4 mmol/L (ref 3.5–5.1)
Sodium: 144 mmol/L (ref 135–145)
TOTAL PROTEIN: 5 g/dL — AB (ref 6.5–8.1)

## 2016-10-15 LAB — CBC
HCT: 31.1 % — ABNORMAL LOW (ref 39.0–52.0)
Hemoglobin: 10.4 g/dL — ABNORMAL LOW (ref 13.0–17.0)
MCH: 29.8 pg (ref 26.0–34.0)
MCHC: 33.4 g/dL (ref 30.0–36.0)
MCV: 89.1 fL (ref 78.0–100.0)
PLATELETS: 154 10*3/uL (ref 150–400)
RBC: 3.49 MIL/uL — AB (ref 4.22–5.81)
RDW: 17 % — AB (ref 11.5–15.5)
WBC: 6.2 10*3/uL (ref 4.0–10.5)

## 2016-10-15 LAB — HEPARIN LEVEL (UNFRACTIONATED)
HEPARIN UNFRACTIONATED: 0.29 [IU]/mL — AB (ref 0.30–0.70)
Heparin Unfractionated: 0.34 IU/mL (ref 0.30–0.70)

## 2016-10-15 LAB — STREP PNEUMONIAE URINARY ANTIGEN: Strep Pneumo Urinary Antigen: NEGATIVE

## 2016-10-15 LAB — TROPONIN I: TROPONIN I: 0.46 ng/mL — AB (ref <0.03)

## 2016-10-15 MED ORDER — PANTOPRAZOLE SODIUM 40 MG PO TBEC
40.0000 mg | DELAYED_RELEASE_TABLET | Freq: Every day | ORAL | Status: DC
  Administered 2016-10-15 – 2016-10-17 (×3): 40 mg via ORAL
  Filled 2016-10-15 (×3): qty 1

## 2016-10-15 MED ORDER — INSULIN ASPART 100 UNIT/ML ~~LOC~~ SOLN: 0.0000 [IU] | Freq: Three times a day (TID) | SUBCUTANEOUS | Status: DC

## 2016-10-15 MED ORDER — SENNOSIDES-DOCUSATE SODIUM 8.6-50 MG PO TABS
2.0000 | ORAL_TABLET | Freq: Every day | ORAL | Status: DC
  Administered 2016-10-15 – 2016-10-17 (×3): 2 via ORAL
  Filled 2016-10-15 (×3): qty 2

## 2016-10-15 MED ORDER — ATORVASTATIN CALCIUM 40 MG PO TABS
40.0000 mg | ORAL_TABLET | Freq: Every day | ORAL | Status: DC
  Administered 2016-10-15 – 2016-10-16 (×2): 40 mg via ORAL
  Filled 2016-10-15 (×2): qty 1

## 2016-10-15 MED ORDER — HYDROCODONE-HOMATROPINE 5-1.5 MG/5ML PO SYRP: 5.0000 mL | ORAL_SOLUTION | ORAL | Status: DC | PRN

## 2016-10-15 MED ORDER — BISACODYL 10 MG RE SUPP
10.0000 mg | Freq: Every day | RECTAL | Status: DC
Start: 2016-10-15 — End: 2016-10-17
  Administered 2016-10-15 – 2016-10-17 (×3): 10 mg via RECTAL
  Filled 2016-10-15 (×3): qty 1

## 2016-10-15 MED ORDER — FESOTERODINE FUMARATE ER 8 MG PO TB24
8.0000 mg | ORAL_TABLET | Freq: Every day | ORAL | Status: DC
  Administered 2016-10-15 – 2016-10-17 (×3): 8 mg via ORAL
  Filled 2016-10-15 (×3): qty 1

## 2016-10-15 MED ORDER — IPRATROPIUM BROMIDE 0.02 % IN SOLN: 0.5000 mg | Freq: Four times a day (QID) | RESPIRATORY_TRACT | Status: DC | PRN

## 2016-10-15 MED ORDER — HYDRALAZINE HCL 25 MG PO TABS
25.0000 mg | ORAL_TABLET | Freq: Three times a day (TID) | ORAL | Status: DC
  Administered 2016-10-15 – 2016-10-17 (×7): 25 mg via ORAL
  Filled 2016-10-15 (×7): qty 1

## 2016-10-15 MED ORDER — ALBUTEROL SULFATE (2.5 MG/3ML) 0.083% IN NEBU: 2.5000 mg | INHALATION_SOLUTION | Freq: Four times a day (QID) | RESPIRATORY_TRACT | Status: DC | PRN

## 2016-10-15 MED ORDER — HYDROCODONE-HOMATROPINE 5-1.5 MG/5ML PO SYRP
5.0000 mL | ORAL_SOLUTION | Freq: Four times a day (QID) | ORAL | Status: DC | PRN
  Administered 2016-10-16: 5 mL via ORAL
  Filled 2016-10-15: qty 5

## 2016-10-15 MED ORDER — PROCHLORPERAZINE MALEATE 10 MG PO TABS: 10.0000 mg | ORAL_TABLET | Freq: Three times a day (TID) | ORAL | Status: DC | PRN

## 2016-10-15 MED ORDER — SODIUM CHLORIDE 0.45 % IV SOLN
INTRAVENOUS | Status: AC
  Administered 2016-10-15: 03:00:00 via INTRAVENOUS

## 2016-10-15 MED ORDER — ALPRAZOLAM 0.5 MG PO TABS: 0.5000 mg | ORAL_TABLET | Freq: Three times a day (TID) | ORAL | Status: DC | PRN

## 2016-10-15 MED ORDER — POLYETHYLENE GLYCOL 3350 17 G PO PACK: 17.0000 g | PACK | Freq: Every day | ORAL | Status: DC | PRN

## 2016-10-15 MED ORDER — SODIUM CHLORIDE 0.9% FLUSH
3.0000 mL | Freq: Two times a day (BID) | INTRAVENOUS | Status: DC
  Administered 2016-10-15 – 2016-10-17 (×5): 3 mL via INTRAVENOUS

## 2016-10-15 MED ORDER — SERTRALINE HCL 100 MG PO TABS
100.0000 mg | ORAL_TABLET | Freq: Every day | ORAL | Status: DC
  Administered 2016-10-15 – 2016-10-17 (×3): 100 mg via ORAL
  Filled 2016-10-15 (×3): qty 1

## 2016-10-15 MED ORDER — CARVEDILOL 3.125 MG PO TABS
3.1250 mg | ORAL_TABLET | Freq: Two times a day (BID) | ORAL | Status: DC
  Administered 2016-10-15 – 2016-10-17 (×5): 3.125 mg via ORAL
  Filled 2016-10-15 (×5): qty 1

## 2016-10-15 MED ORDER — ASPIRIN 81 MG PO CHEW
81.0000 mg | CHEWABLE_TABLET | Freq: Every day | ORAL | Status: DC
  Administered 2016-10-15 – 2016-10-17 (×3): 81 mg via ORAL
  Filled 2016-10-15 (×3): qty 1

## 2016-10-15 MED ORDER — DIVALPROEX SODIUM ER 250 MG PO TB24
250.0000 mg | ORAL_TABLET | Freq: Every day | ORAL | Status: DC
  Administered 2016-10-15 – 2016-10-17 (×3): 250 mg via ORAL
  Filled 2016-10-15 (×3): qty 1

## 2016-10-15 MED ORDER — HYDROCODONE-ACETAMINOPHEN 5-325 MG PO TABS
1.0000 | ORAL_TABLET | Freq: Four times a day (QID) | ORAL | Status: DC | PRN
  Administered 2016-10-15: 1 via ORAL
  Filled 2016-10-15: qty 1

## 2016-10-15 MED ORDER — ISOSORBIDE DINITRATE 10 MG PO TABS
20.0000 mg | ORAL_TABLET | Freq: Three times a day (TID) | ORAL | Status: DC
  Administered 2016-10-15 – 2016-10-17 (×7): 20 mg via ORAL
  Filled 2016-10-15 (×7): qty 2

## 2016-10-15 NOTE — Progress Notes (Signed)
Pt is sleeping most of the night. Troponin elevated and trending down Heparin Drip continues. Plan is to have IV antibiotics for PNA.

## 2016-10-15 NOTE — Consult Note (Signed)
   [facility] CM Inpatient Consult   10/15/2016  Russell [name] November 17, 1923 [id number]  Chart reviewed for [place] management with [facility].  This patient is active with GIP with Hospice and [place].  No [facility] needs.  For questions, please contact:  Natividad Brood, RN BSN [facility] Liaison  [phone] business mobile phone Toll free office [phone]

## 2016-10-15 NOTE — Progress Notes (Signed)
[facility] RN Visit  This is a GIP related and covered hospital admission of 10/14/16 with a [facility] Dx of Heart Failure per [facility] Dr. Antonieta Loveless.  Patient was transferred to [facility] via ambulance after patient noted with respiratory distress and changes in responsiveness in the home.  During visit, patient in bed eating breakfast and watching TV with no visitors at side.  Patient pleasant and denies complaints at this time in NAD with no SOB. Respirations regular/even on 2L O2 via nasal cannula.  Patient states "had a good night and feels  better than yesterday".  Spoke with Daughter/PCG via phone to support and listen to concerns and assure her that [facility] will follow patient daily while in hospital. Also made patient primary physician Dr. Scarlette Calico of [facility] aware of hospitalization.   Patient currently on Aspirin 81mg  tablet QD, Lipitor 40mg  tablet QD, Carvedilol 3.125mg  tablet BID, Depakote 250mg  tablet QD,  Isordil 20mg  tablet TID, Protonix EC tablet 40mg , Zoloft 100mg  tablet QD.  Current Continuous Medications include Maxipime 1g iin Dextrose 5% 50 ml IVPB Q24hr IV, Heparin 100units/ml at 8.60ml/hr IV, Vancomycin 1000mg /22ml IV q48hrs with no prn medications administered since admission.    Placed [facility] medication list and transfer summary on patient shadow chart.  [facility] will continue to follow daily during patient hospitalization. Please feel free to call for Hospice related support as needed.   Thank you, Gar Ponto, RN [facility] Liaison [facility] Liaison can be found on AMION

## 2016-10-15 NOTE — Progress Notes (Signed)
Hypoglycemic Event  CBG: 52  Treatment: 15 GM carbohydrate snack  Symptoms: None  Follow-up CBG: [id number] CBG Result:80  Possible Reasons for Event: Unknown  Comments/MD notified:    Britt Bolognese

## 2016-10-15 NOTE — Progress Notes (Signed)
PROGRESS NOTE                                                                                                                                                                                                             Patient Demographics:    Russell [name], is a 83 y.o. male, DOB - [birth year], TAV:[id number]  Admit date - 10/14/2016   Admitting Physician Reubin Milan, MD  Outpatient Primary MD for the patient is Janith Lima, MD  LOS - 1  Outpatient Specialists:  Chief Complaint  Patient presents with  . Shortness of Breath  . Altered Mental Status       Brief Narrative   83 year old male with history of chronic systolic CHF with EF of 55-37%, dementia, type 2 diabetes mellitus, history of colon and prostate cancer, anxiety, depression, history of hernia, small bowel obstruction, admitted in April for upper GI bleed secondary to esophageal ulcer who is on home hospice was brought to the ED with altered mental status with fever and increasing shortness of breath for past 2 days. In the ED he had temperature of 9 and 0.90 Fahrenheit, respiratory rate of 30 and blood pressure of 152/64 mmHg. He required 100% nonrebreather started by EMS. He received 1 L IV normal saline bolus followed by IV vancomycin and Zosyn after blood cultures were drawn. He was started on empiric IV heparin for elevated troponin (1.01). EKG showed ventricular trigeminy 9 and nonspecific ST abnormality. Patient weaned off nasal cannula. Blood work showed a normal WBC, potassium 5.6, glucose 92, BUN of 56 and creatinine of 2.4 (normal renal function at baseline). BNP was 1616. Chest x-ray showed cardiomegaly with mild diffuse pulmonary interstitial edema.    Subjective:   Patient denies any pain or shortness of breath. Daughter reports patient was  complaining of testicular pain. Denied any symptoms today.  Assessment  & Plan :    Principal  Problem: Acute respiratory failure with hypoxia ([place]) Suspect healthcare associated pneumonia. On empiric vancomycin and cefepime. Follow blood cultures. Strep pneumonia antigen was negative. Supportive care with Tylenol and antitussives.  Active Problems:    Elevated troponin I level Suspect this is due to demand ischemia. No chest pain symptoms. Stable on telemetry today. I will discontinue heparin drip. Continue aspirin, Coreg, Lipitor , hydralazine and Imdur.  Chronic systolic CHF (congestive  heart failure) ([place]) Appears hypovolemic clinically. Has chronically elevated BNP. Chest x-ray showed pulmonary vascular congestion. Will monitor with gentle hydration for today. Continue home medications.   Essential hypertension, benign  Stable. Continue home medications  Moderate dementia with behavioral disturbance Stable. Xanax when necessary for anxiety.    Diabetes mellitus type 2, diet-controlled ([place]) Monitor on sliding scale coverage.   Depression with anxiety Continue Xanax when necessary  AKI (acute kidney injury) ([place]) Possibly due to dehydration. Hold Demadex. Monitor with gentle hydration.  Hyperkalemia Resolved on subsequent labs. Monitor on telemetry.  B12 deficiency Continue supplement.     Code Status : DNR  Family Communication  : d/w daughter on the phone  Disposition Plan  : pending hospital couse  Barriers For Discharge : active symptoms  Consults  :  none  Procedures  : None  DVT Prophylaxis  :  Lovenox -   Lab Results  Component Value Date   PLT 154 10/15/2016    Antibiotics  :   Anti-infectives    Start     Dose/Rate Route Frequency Ordered Stop   10/16/16 1800  vancomycin (VANCOCIN) IVPB 1000 mg/200 mL premix     1,000 mg 200 mL/hr over 60 Minutes Intravenous Every 48 hours 10/14/16 2344 10/22/16 1759   10/15/16 1900  ceFEPIme (MAXIPIME) 1 g in dextrose 5 % 50 mL IVPB     1 g 100 mL/hr over 30 Minutes Intravenous Every 24 hours  10/14/16 2344 10/22/16 1859   10/14/16 2345  ceFEPIme (MAXIPIME) 1 g in dextrose 5 % 50 mL IVPB  Status:  Discontinued     1 g 100 mL/hr over 30 Minutes Intravenous Every 12 hours 10/14/16 2335 10/14/16 2338   10/14/16 1830  piperacillin-tazobactam (ZOSYN) IVPB 3.375 g     3.375 g 100 mL/hr over 30 Minutes Intravenous  Once 10/14/16 1829 10/14/16 2008   10/14/16 1830  vancomycin (VANCOCIN) 1,250 mg in sodium chloride 0.9 % 250 mL IVPB     1,250 mg 166.7 mL/hr over 90 Minutes Intravenous  Once 10/14/16 1829 10/14/16 2008        Objective:   Vitals:   10/14/16 2351 10/15/16 0022 10/15/16 0625 10/15/16 1138  BP: (!) 141/50  (!) 120/45 (!) 99/42  Pulse: 73  67 68  Resp: 16  20 19   Temp: 98.4 F (36.9 C)  98.2 F (36.8 C) 98.2 F (36.8 C)  TempSrc: Oral  Oral Oral  SpO2: 93% 97% 98% 99%  Weight: 70.3 kg (155 lb)     Height: 5\' 10"  (1.778 m)       Wt Readings from Last 3 Encounters:  10/14/16 70.3 kg (155 lb)  08/15/16 60.9 kg (134 lb 4 oz)  08/01/16 61.3 kg (135 lb 2.3 oz)     Intake/Output Summary (Last 24 hours) at 10/15/16 1331 Last data filed at 10/15/16 0939  Gross per 24 hour  Intake           1055.5 ml  Output              400 ml  Net            655.5 ml     Physical Exam  Gen: not in distress HEENT: moist mucosa, supple neck Chest: Diminished bibasilar breath sounds CVS: N S1&S2, no murmurs, rubs or gallop GI: soft, NT, ND,  Musculoskeletal: warm, no edema CNS: AAOX1, non focal    Data Review:    CBC  Recent Labs Lab 10/14/16  1610 10/15/16 0432  WBC 6.9 6.2  HGB 12.6* 10.4*  HCT 37.9* 31.1*  PLT 175 154  MCV 90.9 89.1  MCH 30.2 29.8  MCHC 33.2 33.4  RDW 17.2* 17.0*  LYMPHSABS 0.3*  --   MONOABS 1.0  --   EOSABS 0.0  --   BASOSABS 0.0  --     Chemistries   Recent Labs Lab 10/14/16 1610 10/14/16 2045 10/15/16 0432  NA 141  --  144  K 5.6*  --  4.0  CL 105  --  109  CO2 26  --  26  GLUCOSE 92  --  68  BUN 56*  --  60*    CREATININE 2.40*  --  2.11*  CALCIUM 8.8*  --  8.4*  MG  --  2.1  --   AST 70*  --  44*  ALT 27  --  22  ALKPHOS 56  --  43  BILITOT 0.5  --  0.8   ------------------------------------------------------------------------------------------------------------------ No results for input(s): CHOL, HDL, LDLCALC, TRIG, CHOLHDL, LDLDIRECT in the last 72 hours.  Lab Results  Component Value Date   HGBA1C 5.4 12/17/2015   ------------------------------------------------------------------------------------------------------------------ No results for input(s): TSH, T4TOTAL, T3FREE, THYROIDAB in the last 72 hours.  Invalid input(s): FREET3 ------------------------------------------------------------------------------------------------------------------ No results for input(s): VITAMINB12, FOLATE, FERRITIN, TIBC, IRON, RETICCTPCT in the last 72 hours.  Coagulation profile No results for input(s): INR, PROTIME in the last 168 hours.  No results for input(s): DDIMER in the last 72 hours.  Cardiac Enzymes  Recent Labs Lab 10/15/16 0215  TROPONINI 0.46*   ------------------------------------------------------------------------------------------------------------------    Component Value Date/Time   BNP 1,616.6 (H) 10/14/2016 1610    Inpatient Medications  Scheduled Meds: . aspirin  81 mg Oral Daily  . atorvastatin  40 mg Oral q1800  . bisacodyl  10 mg Rectal Daily  . carvedilol  3.125 mg Oral BID WC  . divalproex  250 mg Oral Daily  . fesoterodine  8 mg Oral Daily  . hydrALAZINE  25 mg Oral TID  . insulin aspart  0-9 Units Subcutaneous TID WC  . isosorbide dinitrate  20 mg Oral TID  . pantoprazole  40 mg Oral Daily  . senna-docusate  2 tablet Oral Daily  . sertraline  100 mg Oral Daily  . sodium chloride flush  3 mL Intravenous Q12H   Continuous Infusions: . sodium chloride 50 mL/hr at 10/15/16 0245  . ceFEPime (MAXIPIME) IV    . heparin 900 Units/hr (10/15/16 1206)  .  [START ON 10/16/2016] vancomycin     PRN Meds:.albuterol, ALPRAZolam, HYDROcodone-acetaminophen, HYDROcodone-homatropine, ipratropium, polyethylene glycol, prochlorperazine  Micro Results No results found for this or any previous visit (from the past 240 hour(s)).  Radiology Reports Dg Chest Portable 1 View  Result Date: 10/14/2016 CLINICAL DATA:  Initial evaluation for acute dyspnea. Increasing shortness of breath. EXAM: PORTABLE CHEST 1 VIEW COMPARISON:  Prior radiograph from 08/01/2016. FINDINGS: Mild cardiomegaly. Mediastinal silhouette normal. Aortic atherosclerosis. Lungs normally inflated. Mild perihilar vascular congestion with diffuse interstitial prominence, suggesting mild pulmonary interstitial edema. No focal infiltrates. No definite pleural effusion. No pneumothorax. No acute osseus abnormality. IMPRESSION: 1. Cardiomegaly with mild diffuse pulmonary interstitial congestion/edema. 2. No other active cardiopulmonary disease. 3. Aortic atherosclerosis. Electronically Signed   By: Jeannine Boga M.D.   On: 10/14/2016 16:47    Time Spent in minutes  25   Louellen Molder M.D on 10/15/2016 at 1:31 PM  Between 7am to 7pm - Pager - [phone]  After 7pm go to [email or web address] - password [facility]  [facility] -  Office  [phone]

## 2016-10-15 NOTE — Progress Notes (Signed)
ANTICOAGULATION CONSULT NOTE - Follow Up Consult  Pharmacy Consult for heparin Indication: chest pain/ACS  Labs:  Recent Labs  10/14/16 1610 10/14/16 2045 10/15/16 0215 10/15/16 0432  HGB 12.6*  --   --  10.4*  HCT 37.9*  --   --  31.1*  PLT 175  --   --  154  HEPARINUNFRC  --   --   --  0.34  CREATININE 2.40*  --   --  2.11*  CKTOTAL  --  267  --   --   TROPONINI  --   --  0.46*  --     Assessment/Plan:  83yo male therapeutic on heparin with initial dosing for elevated troponin. Will continue gtt at current rate and confirm stable with additional level.   Wynona Neat, PharmD, BCPS  10/15/2016,6:55 AM

## 2016-10-15 NOTE — Progress Notes (Signed)
[facility] for heparin Indication: chest pain/ACS  No Known Allergies  Patient Measurements: Height: 5\' 10"  (177.8 cm) Weight: 155 lb (70.3 kg) IBW/kg (Calculated) : 73 Heparin Dosing Weight: 64kg  Vital Signs: Temp: 98.2 F (36.8 C) (06/18 0625) Temp Source: Oral (06/18 0625) BP: 120/45 (06/18 0625) Pulse Rate: 67 (06/18 0625)  Labs:  Recent Labs  10/14/16 1610 10/14/16 2045 10/15/16 0215 10/15/16 0432 10/15/16 1055  HGB 12.6*  --   --  10.4*  --   HCT 37.9*  --   --  31.1*  --   PLT 175  --   --  154  --   HEPARINUNFRC  --   --   --  0.34 0.29*  CREATININE 2.40*  --   --  2.11*  --   CKTOTAL  --  267  --   --   --   TROPONINI  --   --  0.46*  --   --     Estimated Creatinine Clearance: 22.7 mL/min (A) (by C-G formula based on SCr of 2.11 mg/dL (H)).   Assessment: 89 YOM under care of hospice PTA who presents with shortness of breath and skin being hot to the touch. Troponin is elevated as is BNP. Baseline Hgb 12.6, plts 175.  Repeat heparin level is slightly subtherapeutic at 0.29 on heparin 850 units/hr. No issues with infusion or bleeding.  Goal of Therapy:  Heparin level 0.3-0.7 units/ml Monitor platelets by anticoagulation protocol: Yes   Plan:  Increase heparin to 900 units/hr Daily heparin level and CBC   Andrey Cota. Diona Foley, PharmD, BCPS Clinical Pharmacist [phone] 10/15/2016 11:24 AM

## 2016-10-16 DIAGNOSIS — N179 Acute kidney failure, unspecified: Secondary | ICD-10-CM

## 2016-10-16 LAB — GLUCOSE, CAPILLARY
Glucose-Capillary: 63 mg/dL — ABNORMAL LOW (ref 65–99)
Glucose-Capillary: 111 mg/dL — ABNORMAL HIGH (ref 65–99)
Glucose-Capillary: 110 mg/dL — ABNORMAL HIGH (ref 65–99)
Glucose-Capillary: 104 mg/dL — ABNORMAL HIGH (ref 65–99)
Glucose-Capillary: 110 mg/dL — ABNORMAL HIGH (ref 65–99)

## 2016-10-16 LAB — COMPREHENSIVE METABOLIC PANEL
ALBUMIN: 2.4 g/dL — AB (ref 3.5–5.0)
ALK PHOS: 49 U/L (ref 38–126)
ALT: 18 U/L (ref 17–63)
AST: 36 U/L (ref 15–41)
Anion gap: 8 (ref 5–15)
BILIRUBIN TOTAL: 0.8 mg/dL (ref 0.3–1.2)
BUN: 54 mg/dL — AB (ref 6–20)
CALCIUM: 8 mg/dL — AB (ref 8.9–10.3)
CO2: 24 mmol/L (ref 22–32)
Chloride: 105 mmol/L (ref 101–111)
Creatinine, Ser: 1.55 mg/dL — ABNORMAL HIGH (ref 0.61–1.24)
GFR calc Af Amer: 43 mL/min — ABNORMAL LOW (ref >60)
GFR calc non Af Amer: 37 mL/min — ABNORMAL LOW (ref >60)
GLUCOSE: 71 mg/dL (ref 65–99)
Potassium: 3.5 mmol/L (ref 3.5–5.1)
Sodium: 137 mmol/L (ref 135–145)
Total Protein: 5.1 g/dL — ABNORMAL LOW (ref 6.5–8.1)

## 2016-10-16 LAB — CBC
HEMATOCRIT: 30.8 % — AB (ref 39.0–52.0)
HEMOGLOBIN: 10.2 g/dL — AB (ref 13.0–17.0)
MCH: 29.8 pg (ref 26.0–34.0)
MCHC: 33.1 g/dL (ref 30.0–36.0)
MCV: 90.1 fL (ref 78.0–100.0)
Platelets: 151 10*3/uL (ref 150–400)
RBC: 3.42 MIL/uL — ABNORMAL LOW (ref 4.22–5.81)
RDW: 16.9 % — ABNORMAL HIGH (ref 11.5–15.5)
WBC: 7.9 10*3/uL (ref 4.0–10.5)

## 2016-10-16 MED ORDER — LEVOFLOXACIN IN D5W 750 MG/150ML IV SOLN
750.0000 mg | INTRAVENOUS | Status: DC
  Administered 2016-10-16: 750 mg via INTRAVENOUS
  Filled 2016-10-16 (×2): qty 150

## 2016-10-16 NOTE — Progress Notes (Signed)
Pharmacy Antibiotic Note  Russell [name] is a 83 y.o. male admitted on 10/14/2016 with AMS, now w/ concern for pneumonia.  Pharmacy has been consulted for Levaquin dosing.  Plan: Levaquin 750mg  IV Q48H.  Height: 5\' 10"  (177.8 cm) Weight: 154 lb (69.9 kg) IBW/kg (Calculated) : 73  Temp (24hrs), Avg:98.4 F (36.9 C), Min:97.9 F (36.6 C), Max:99.3 F (37.4 C)   Recent Labs Lab 10/14/16 1610 10/15/16 0432 10/16/16 0223  WBC 6.9 6.2 7.9  CREATININE 2.40* 2.11* 1.55*    Estimated Creatinine Clearance: 30.7 mL/min (A) (by C-G formula based on SCr of 1.55 mg/dL (H)).    No Known Allergies   Thank you for allowing pharmacy to be a part of this patient's care.  Wynona Neat, PharmD, BCPS  10/16/2016 7:37 AM

## 2016-10-16 NOTE — Progress Notes (Signed)
PROGRESS NOTE                                                                                                                                                                                                             Patient Demographics:    Russell [name], is a 83 y.o. male, DOB - 1923/05/02, NOM:[id number]  Admit date - 10/14/2016   Admitting Physician Reubin Milan, MD  Outpatient Primary MD for the patient is Janith Lima, MD  LOS - 2  Outpatient Specialists:  Chief Complaint  Patient presents with  . Shortness of Breath  . Altered Mental Status       Brief Narrative   83 year old male with history of chronic systolic CHF with EF of 96-28%, dementia, type 2 diabetes mellitus, history of colon and prostate cancer, anxiety, depression, history of hernia, small bowel obstruction, admitted in April for upper GI bleed secondary to esophageal ulcer who is on home hospice was brought to the ED with altered mental status with fever and increasing shortness of breath for past 2 days. In the ED he had temperature of 9 and 0.90 Fahrenheit, respiratory rate of 30 and blood pressure of 152/64 mmHg. He required 100% nonrebreather started by EMS. He received 1 L IV normal saline bolus followed by IV vancomycin and Zosyn after blood cultures were drawn. He was started on empiric IV heparin for elevated troponin (1.01). EKG showed ventricular trigeminy 9 and nonspecific ST abnormality. Patient weaned off nasal cannula. Blood work showed a normal WBC, potassium 5.6, glucose 92, BUN of 56 and creatinine of 2.4 (normal renal function at baseline). BNP was 1616. Chest x-ray showed cardiomegaly with mild diffuse pulmonary interstitial edema.    Subjective:   Denies any chest discomfort or worsened dyspnea.  Assessment  & Plan :    Principal Problem: Acute respiratory failure with hypoxia ([place]) Suspect healthcare associated  pneumonia. Blood cx and strep pneumonia ag negative. Narrow abx to levaquin.  Albuterol nebs prn. Supportive care with Tylenol and antitussives.  Active Problems:    Elevated troponin I level Suspect this is due to demand ischemia. No chest pain symptoms. Stable on telemetry today. I will discontinue heparin drip. Continue aspirin, Coreg, Lipitor , hydralazine and Imdur.  Chronic systolic CHF (congestive heart failure) ([facility]) Appears hypovolemic clinically. Has chronically elevated BNP. Chest x-ray  showed pulmonary vascular congestion. D/c fluids. Continue home meds.   Essential hypertension, benign  Stable. Continue home medications  Moderate dementia with behavioral disturbance Stable. Xanax when necessary for anxiety.    Diabetes mellitus type 2, diet-controlled ([place]) Monitor on sliding scale coverage.   Depression with anxiety Continue Xanax when necessary  AKI (acute kidney injury) ([place]) Possibly due to dehydration. Renal function slowly improving. Off fluids. Hold demadex today.  Hyperkalemia Resolved on subsequent labs. Stable on telemetry.  B12 deficiency Continue supplement.     Code Status : DNR  Family Communication  : none at bedside  Disposition Plan  : home tomorrow if continues to improve.  Barriers For Discharge : active symptoms  Consults  :  none  Procedures  : None  DVT Prophylaxis  :  Lovenox -   Lab Results  Component Value Date   PLT 151 10/16/2016    Antibiotics  :   Anti-infectives    Start     Dose/Rate Route Frequency Ordered Stop   10/16/16 1800  vancomycin (VANCOCIN) IVPB 1000 mg/200 mL premix  Status:  Discontinued     1,000 mg 200 mL/hr over 60 Minutes Intravenous Every 48 hours 10/14/16 2344 10/16/16 0728   10/16/16 0800  levofloxacin (LEVAQUIN) IVPB 750 mg     750 mg 100 mL/hr over 90 Minutes Intravenous Every 48 hours 10/16/16 0738     10/15/16 1900  ceFEPIme (MAXIPIME) 1 g in dextrose 5 % 50 mL IVPB  Status:   Discontinued     1 g 100 mL/hr over 30 Minutes Intravenous Every 24 hours 10/14/16 2344 10/16/16 0728   10/14/16 2345  ceFEPIme (MAXIPIME) 1 g in dextrose 5 % 50 mL IVPB  Status:  Discontinued     1 g 100 mL/hr over 30 Minutes Intravenous Every 12 hours 10/14/16 2335 10/14/16 2338   10/14/16 1830  piperacillin-tazobactam (ZOSYN) IVPB 3.375 g     3.375 g 100 mL/hr over 30 Minutes Intravenous  Once 10/14/16 1829 10/14/16 2008   10/14/16 1830  vancomycin (VANCOCIN) 1,250 mg in sodium chloride 0.9 % 250 mL IVPB     1,250 mg 166.7 mL/hr over 90 Minutes Intravenous  Once 10/14/16 1829 10/14/16 2008        Objective:   Vitals:   10/16/16 0025 10/16/16 0225 10/16/16 0708 10/16/16 1215  BP: (!) 113/46  (!) 141/47 (!) 113/46  Pulse: 60  (!) 59 71  Resp: 16  18   Temp: 98 F (36.7 C)  99.3 F (37.4 C)   TempSrc: Oral  Oral   SpO2: 93%  97% 96%  Weight:  69.9 kg (154 lb)    Height:        Wt Readings from Last 3 Encounters:  10/16/16 69.9 kg (154 lb)  08/15/16 60.9 kg (134 lb 4 oz)  08/01/16 61.3 kg (135 lb 2.3 oz)     Intake/Output Summary (Last 24 hours) at 10/16/16 1254 Last data filed at 10/16/16 1100  Gross per 24 hour  Intake              530 ml  Output              925 ml  Net             -395 ml     Physical Exam Gen: NAD HEENT: moist mucosa, supple neck Chest; scattered wheezing b/l CVS: N S1&S2, no murmurs GI: soft, NT, ND Musculoskeletal: warm, no edema  Data Review:    CBC  Recent Labs Lab 10/14/16 1610 10/15/16 0432 10/16/16 0223  WBC 6.9 6.2 7.9  HGB 12.6* 10.4* 10.2*  HCT 37.9* 31.1* 30.8*  PLT 175 154 151  MCV 90.9 89.1 90.1  MCH 30.2 29.8 29.8  MCHC 33.2 33.4 33.1  RDW 17.2* 17.0* 16.9*  LYMPHSABS 0.3*  --   --   MONOABS 1.0  --   --   EOSABS 0.0  --   --   BASOSABS 0.0  --   --     Chemistries   Recent Labs Lab 10/14/16 1610 10/14/16 2045 10/15/16 0432 10/16/16 0223  NA 141  --  144 137  K 5.6*  --  4.0 3.5  CL 105   --  109 105  CO2 26  --  26 24  GLUCOSE 92  --  68 71  BUN 56*  --  60* 54*  CREATININE 2.40*  --  2.11* 1.55*  CALCIUM 8.8*  --  8.4* 8.0*  MG  --  2.1  --   --   AST 70*  --  44* 36  ALT 27  --  22 18  ALKPHOS 56  --  43 49  BILITOT 0.5  --  0.8 0.8   ------------------------------------------------------------------------------------------------------------------ No results for input(s): CHOL, HDL, LDLCALC, TRIG, CHOLHDL, LDLDIRECT in the last 72 hours.  Lab Results  Component Value Date   HGBA1C 5.4 12/17/2015   ------------------------------------------------------------------------------------------------------------------ No results for input(s): TSH, T4TOTAL, T3FREE, THYROIDAB in the last 72 hours.  Invalid input(s): FREET3 ------------------------------------------------------------------------------------------------------------------ No results for input(s): VITAMINB12, FOLATE, FERRITIN, TIBC, IRON, RETICCTPCT in the last 72 hours.  Coagulation profile No results for input(s): INR, PROTIME in the last 168 hours.  No results for input(s): DDIMER in the last 72 hours.  Cardiac Enzymes  Recent Labs Lab 10/15/16 0215  TROPONINI 0.46*   ------------------------------------------------------------------------------------------------------------------    Component Value Date/Time   BNP 1,616.6 (H) 10/14/2016 1610    Inpatient Medications  Scheduled Meds: . aspirin  81 mg Oral Daily  . atorvastatin  40 mg Oral q1800  . bisacodyl  10 mg Rectal Daily  . carvedilol  3.125 mg Oral BID WC  . divalproex  250 mg Oral Daily  . fesoterodine  8 mg Oral Daily  . hydrALAZINE  25 mg Oral TID  . insulin aspart  0-9 Units Subcutaneous TID WC  . isosorbide dinitrate  20 mg Oral TID  . pantoprazole  40 mg Oral Daily  . senna-docusate  2 tablet Oral Daily  . sertraline  100 mg Oral Daily  . sodium chloride flush  3 mL Intravenous Q12H   Continuous Infusions: .  levofloxacin (LEVAQUIN) IV Stopped (10/16/16 1035)   PRN Meds:.albuterol, ALPRAZolam, HYDROcodone-acetaminophen, HYDROcodone-homatropine, ipratropium, polyethylene glycol, prochlorperazine  Micro Results Recent Results (from the past 240 hour(s))  Blood culture (routine x 2)     Status: None (Preliminary result)   Collection Time: 10/14/16  4:18 PM  Result Value Ref Range Status   Specimen Description BLOOD LEFT FOREARM  Final   Special Requests IN PEDIATRIC BOTTLE Blood Culture adequate volume  Final   Culture NO GROWTH < 24 HOURS  Final   Report Status PENDING  Incomplete  Blood culture (routine x 2)     Status: None (Preliminary result)   Collection Time: 10/14/16  7:26 PM  Result Value Ref Range Status   Specimen Description BLOOD RIGHT HAND  Final   Special Requests IN PEDIATRIC BOTTLE Blood Culture  adequate volume  Final   Culture NO GROWTH < 24 HOURS  Final   Report Status PENDING  Incomplete    Radiology Reports Dg Chest Portable 1 View  Result Date: 10/14/2016 CLINICAL DATA:  Initial evaluation for acute dyspnea. Increasing shortness of breath. EXAM: PORTABLE CHEST 1 VIEW COMPARISON:  Prior radiograph from 08/01/2016. FINDINGS: Mild cardiomegaly. Mediastinal silhouette normal. Aortic atherosclerosis. Lungs normally inflated. Mild perihilar vascular congestion with diffuse interstitial prominence, suggesting mild pulmonary interstitial edema. No focal infiltrates. No definite pleural effusion. No pneumothorax. No acute osseus abnormality. IMPRESSION: 1. Cardiomegaly with mild diffuse pulmonary interstitial congestion/edema. 2. No other active cardiopulmonary disease. 3. Aortic atherosclerosis. Electronically Signed   By: Jeannine Boga M.D.   On: 10/14/2016 16:47    Time Spent in minutes  25   Louellen Molder M.D on 10/16/2016 at 12:54 PM  Between 7am to 7pm - Pager - [phone]  After 7pm go to [email or web address] - password [facility]  [facility] -  Office   [phone]

## 2016-10-16 NOTE — Progress Notes (Signed)
[id number]- GIP RN Visit @ 10:30 AM  This is a GIP, related and covered hospital admission of 10/14/16 with a [facility] Dx of Heart Failure, per [facility] Dr. Antonieta Loveless.  Patient was transferred to [facility] via ambulance after patient noted with respiratory distress and changes in responsiveness in the home.  Patient was admitted for treatment of PNA.  Visited patient in room, with daughter, Cyndra Numbers at bedside.  Patient alert and oriented to person and place.  Patient currently on 2L [place] with O2 sats at 100% and respirations WNL.  He denies pain.  He reported getting intermittent episodes of sleep last night.  Per discussion with nurse tech, patient ate approximately 25% of breakfast and was able to feed himself.  Patient has not been out of bed this hospital admission. External catheter in place and moderate amount of clear, yellow urine noted in drainage bag. Patient received 750 mg Levaquin IV this morning for treatment of HCAP.  Patient received one dose of Norco 5/325 mg po for c/o pain yesterday.  Spoke to family re: plans for patient at discharge.  [facility] stated that they would like to keep the patient home with the support of hospice as long as possible.  Hospice continues to have conversations with family about the patient's decline and poor prognosis.  Patient is now a DNR and will need gold DNR sent home with patient at time of discharge.  [facility] will continue to follow daily during patient hospitalization. Please feel free to call for Hospice related support as needed.   Thank you, Freddi Starr, RN, [place] [facility] Liaison [facility]iaison can be found on AMION

## 2016-10-17 DIAGNOSIS — I10 Essential (primary) hypertension: Secondary | ICD-10-CM

## 2016-10-17 DIAGNOSIS — E875 Hyperkalemia: Secondary | ICD-10-CM

## 2016-10-17 DIAGNOSIS — R748 Abnormal levels of other serum enzymes: Secondary | ICD-10-CM

## 2016-10-17 DIAGNOSIS — I5022 Chronic systolic (congestive) heart failure: Secondary | ICD-10-CM

## 2016-10-17 DIAGNOSIS — E876 Hypokalemia: Secondary | ICD-10-CM

## 2016-10-17 LAB — GLUCOSE, CAPILLARY
GLUCOSE-CAPILLARY: 68 mg/dL (ref 65–99)
GLUCOSE-CAPILLARY: 94 mg/dL (ref 65–99)
Glucose-Capillary: 107 mg/dL — ABNORMAL HIGH (ref 65–99)

## 2016-10-17 LAB — BASIC METABOLIC PANEL
ANION GAP: 7 (ref 5–15)
BUN: 42 mg/dL — AB (ref 6–20)
CALCIUM: 8.2 mg/dL — AB (ref 8.9–10.3)
CO2: 25 mmol/L (ref 22–32)
Chloride: 105 mmol/L (ref 101–111)
Creatinine, Ser: 1.41 mg/dL — ABNORMAL HIGH (ref 0.61–1.24)
GFR calc Af Amer: 49 mL/min — ABNORMAL LOW (ref >60)
GFR, EST NON AFRICAN AMERICAN: 42 mL/min — AB (ref >60)
GLUCOSE: 87 mg/dL (ref 65–99)
Potassium: 3.4 mmol/L — ABNORMAL LOW (ref 3.5–5.1)
Sodium: 137 mmol/L (ref 135–145)

## 2016-10-17 MED ORDER — LEVOFLOXACIN 750 MG PO TABS: 750.0000 mg | ORAL_TABLET | ORAL | 0 refills | Status: AC

## 2016-10-17 MED ORDER — POTASSIUM CHLORIDE CRYS ER 20 MEQ PO TBCR
40.0000 meq | EXTENDED_RELEASE_TABLET | Freq: Once | ORAL | Status: AC
  Administered 2016-10-17: 40 meq via ORAL
  Filled 2016-10-17: qty 2

## 2016-10-17 MED ORDER — TORSEMIDE 20 MG PO TABS: 20.0000 mg | ORAL_TABLET | Freq: Every day | ORAL | 1 refills | Status: DC

## 2016-10-17 NOTE — Progress Notes (Signed)
CM talked to patient's daughter [name] concerning transportation home via ambulance- address verified ( it is different than what is listed on the facesheet; the address listed on the face sheet is Kunal Levario's address and not the patient's residence). She had lots of questions about DC home; Attending MD paged to contacted the daughter. Mindi Slicker [facility] [phone]

## 2016-10-17 NOTE — Discharge Summary (Signed)
Physician Discharge Summary  Russell [name] OMV:[id number] DOB: 09/27/1923 DOA: 10/14/2016  PCP: Janith Lima, MD  Admit date: 10/14/2016 Discharge date: 10/17/2016  Admitted From: Home with hospice Disposition:  Home with home hospice  Recommendations for Outpatient Follow-up:  1. Follow up with PCP in 1-2 weeks 2. Patient will complete 5 days antibiotic course on 6/23 3. Resume torsemide on 6/22.   Home Health: None Equipment/Devices: Chronic oxygen with home hospice.  Discharge Condition: Fair CODE STATUS: DO NOT RESUSCITATE Diet recommendation: Heart Healthy / Carb Modified   Discharge Diagnoses:  Principal Problem:   HCAP (healthcare-associated pneumonia)   Active Problems:   AKI (acute kidney injury) ([place])   Moderate dementia with behavioral disturbance   Chronic systolic CHF (congestive heart failure) (HCC)   Hyperkalemia   Hyperlipidemia with target LDL less than 100   Essential hypertension, benign   Diabetes mellitus type 2, diet-controlled (HCC)   Elevated troponin I level   Depression with anxiety   Anemia  Brief narrative/history of present illness 83 year old male with history of chronic systolic CHF with EF of 62-83%, dementia, type 2 diabetes mellitus, history of colon and prostate cancer, anxiety, depression, history of hernia, small bowel obstruction, admitted in April for upper GI bleed secondary to esophageal ulcer who is on home hospice was brought to the ED with altered mental status with fever and increasing shortness of breath for past 2 days. In the ED he had temperature of 9 and 0.90 Fahrenheit, respiratory rate of 30 and blood pressure of 152/64 mmHg. He required 100% nonrebreather started by EMS. He received 1 L IV normal saline bolus followed by IV vancomycin and Zosyn after blood cultures were drawn. He was started on empiric IV heparin for elevated troponin (1.01). EKG showed ventricular trigeminy 9 and nonspecific ST abnormality. Patient weaned  off nasal cannula. Blood work showed a normal WBC, potassium 5.6, glucose 92, BUN of 56 and creatinine of 2.4 (normal renal function at baseline). BNP was 1616. Chest x-ray showed cardiomegaly with mild diffuse pulmonary interstitial edema.  Hospital course  Principal Problem: Acute respiratory failure with hypoxia ([place]) Suspect healthcare associated pneumonia. Blood cx and strep pneumonia ag negative. Narrowed abx to levaquin treatment for total 5 days. Received when necessary nebs and antitussives.. Remains afebrile and symptoms better today.  Active Problems:    Elevated troponin I level Suspect this is due to demand ischemia. No chest pain symptoms. Stable on telemetry. Heparin drip discontinued. Continue aspirin, Coreg, Lipitor , hydralazine and Imdur.  AKI (acute kidney injury) ([place]) Possibly due to dehydration. Renal function slowly improving. Off fluids. Hold Demadex until 6/22.  Chronic systolic CHF (congestive heart failure) (HCC) Appears hypovolemic clinically. Has chronically elevated BNP. Chest x-ray showed pulmonary vascular congestion. Off fluids. Continue home medications. Resume torsemide in 2 days.   Essential hypertension, benign  Stable. Continue home medications  Moderate dementia with behavioral disturbance Stable. Xanax when necessary for anxiety.    Diabetes mellitus type 2, diet-controlled ([place]) Monitor on sliding scale coverage.   Depression with anxiety Continue Xanax when necessary   Hyperkalemia Resolved on subsequent lab, now hypokalemic. Resume home potassium supplement.  B12 deficiency Continue supplement.     Family Communication  : none at bedside, discussed with daughter on the phone  Disposition Plan  : home   Consults  :  none  Procedures  : None  Discharge Instructions   Allergies as of 10/17/2016   No Known Allergies     Medication List  STOP taking these medications   isosorbide dinitrate 20 MG  tablet Commonly known as:  ISORDIL     TAKE these medications   ALPRAZolam 0.5 MG tablet Commonly known as:  XANAX TAKE 1 TABLET BY MOUTH THREE TIMES DAILY AS NEEDED FOR ANXIETY What changed:  See the new instructions.   aspirin 81 MG tablet Take 81 mg by mouth daily.   BIDIL 20-37.5 MG tablet Generic drug:  isosorbide-hydrALAZINE Take 1 tablet by mouth 3 (three) times daily.   carvedilol 3.125 MG tablet Commonly known as:  COREG TAKE 1 TABLET(3.125 MG) BY MOUTH TWICE DAILY WITH A MEAL   divalproex 250 MG 24 hr tablet Commonly known as:  DEPAKOTE ER Take 1 tablet (250 mg total) by mouth daily.   DULCOLAX 10 MG suppository Generic drug:  bisacodyl Place 1 suppository rectally daily. Unwrap and insert 1 suppository rectally once daily if not bowel movement  In 3 days   fesoterodine 8 MG Tb24 tablet Commonly known as:  TOVIAZ Take 1 tablet (8 mg total) by mouth daily.   Hernia Belt Double Large Misc Use as needed for Hernia pain.   hydrALAZINE 25 MG tablet Commonly known as:  APRESOLINE Take 1 tablet (25 mg total) by mouth 3 (three) times daily.   HYDROcodone-acetaminophen 5-325 MG tablet Commonly known as:  NORCO/VICODIN Take 1 tablet by mouth every 6 (six) hours as needed for moderate pain.   HYDROMET 5-1.5 MG/5ML syrup Generic drug:  HYDROcodone-homatropine Take 5 mLs by mouth as needed for cough.   levofloxacin 750 MG tablet Commonly known as:  LEVAQUIN Take 1 tablet (750 mg total) by mouth every other day.   polyethylene glycol packet Commonly known as:  MIRALAX / GLYCOLAX Take 17 g by mouth daily. What changed:  when to take this  reasons to take this   potassium chloride SA 20 MEQ tablet Commonly known as:  K-DUR,KLOR-CON Take 1 tablet (20 mEq total) by mouth daily.   prochlorperazine 10 MG tablet Commonly known as:  COMPAZINE Take 1 tablet (10 mg total) by mouth every 4 (four) hours. What changed:  when to take this  reasons to take this    promethazine-dextromethorphan 6.25-15 MG/5ML syrup Commonly known as:  PROMETHAZINE-DM Take 5 mLs by mouth as needed for cough.   SENEXON-S 8.6-50 MG tablet Generic drug:  senna-docusate Take 2 tablets by mouth every day   sertraline 100 MG tablet Commonly known as:  ZOLOFT TAKE 1 TABLET BY MOUTH DAILY What changed:  See the new instructions.   torsemide 20 MG tablet Commonly known as:  DEMADEX Take 1 tablet (20 mg total) by mouth daily. Start taking on:  10/19/2016 What changed:  See the new instructions.  These instructions start on 10/19/2016. If you are unsure what to do until then, ask your doctor or other care provider.   VITAMIN B-12 PO Take 1 tablet by mouth daily with breakfast.      Follow-up Information    Janith Lima, MD Follow up in 1 week(s).   Specialty:  Internal Medicine Contact information: [address] [address] [phone]        home hospice Follow up.          No Known Allergies    Procedures/Studies: Dg Chest Portable 1 View  Result Date: 10/14/2016 CLINICAL DATA:  Initial evaluation for acute dyspnea. Increasing shortness of breath. EXAM: PORTABLE CHEST 1 VIEW COMPARISON:  Prior radiograph from 08/01/2016. FINDINGS: Mild cardiomegaly. Mediastinal  silhouette normal. Aortic atherosclerosis. Lungs normally inflated. Mild perihilar vascular congestion with diffuse interstitial prominence, suggesting mild pulmonary interstitial edema. No focal infiltrates. No definite pleural effusion. No pneumothorax. No acute osseus abnormality. IMPRESSION: 1. Cardiomegaly with mild diffuse pulmonary interstitial congestion/edema. 2. No other active cardiopulmonary disease. 3. Aortic atherosclerosis. Electronically Signed   By: Jeannine Boga M.D.   On: 10/14/2016 16:47       Subjective: No overnight events. Denies worsening dyspnea or chest discomfort.  Discharge Exam: Vitals:   10/16/16 2108 10/17/16 0559  BP:  118/65 (!) 148/52  Pulse: 65 68  Resp: 20 20  Temp: 99 F (37.2 C) 100 F (37.8 C)   Vitals:   10/16/16 0708 10/16/16 1215 10/16/16 2108 10/17/16 0559  BP: (!) 141/47 (!) 113/46 118/65 (!) 148/52  Pulse: (!) 59 71 65 68  Resp: 18  20 20   Temp: 99.3 F (37.4 C)  99 F (37.2 C) 100 F (37.8 C)  TempSrc: Oral  Oral Oral  SpO2: 97% 96% 99% 97%  Weight:    64 kg (141 lb)  Height:        Gen: NAD HEENT: moist mucosa, supple neck Chest; few scattered rhonchi CVS: N S1&S2, no murmurs GI: soft, NT, ND Musculoskeletal: warm, no edema    The results of significant diagnostics from this hospitalization (including imaging, microbiology, ancillary and laboratory) are listed below for reference.     Microbiology: Recent Results (from the past 240 hour(s))  Blood culture (routine x 2)     Status: None (Preliminary result)   Collection Time: 10/14/16  4:18 PM  Result Value Ref Range Status   Specimen Description BLOOD LEFT FOREARM  Final   Special Requests IN PEDIATRIC BOTTLE Blood Culture adequate volume  Final   Culture NO GROWTH 2 DAYS  Final   Report Status PENDING  Incomplete  Blood culture (routine x 2)     Status: None (Preliminary result)   Collection Time: 10/14/16  7:26 PM  Result Value Ref Range Status   Specimen Description BLOOD RIGHT HAND  Final   Special Requests IN PEDIATRIC BOTTLE Blood Culture adequate volume  Final   Culture NO GROWTH 2 DAYS  Final   Report Status PENDING  Incomplete     Labs: BNP (last 3 results)  Recent Labs  12/16/15 1532 12/31/15 2350 10/14/16 1610  BNP 2,189.3* 2,335.5* 1,700.1*   Basic Metabolic Panel:  Recent Labs Lab 10/14/16 1610 10/14/16 2045 10/15/16 0432 10/16/16 0223 10/17/16 0223  NA 141  --  144 137 137  K 5.6*  --  4.0 3.5 3.4*  CL 105  --  109 105 105  CO2 26  --  26 24 25   GLUCOSE 92  --  68 71 87  BUN 56*  --  60* 54* 42*  CREATININE 2.40*  --  2.11* 1.55* 1.41*  CALCIUM 8.8*  --  8.4* 8.0* 8.2*  MG   --  2.1  --   --   --    Liver Function Tests:  Recent Labs Lab 10/14/16 1610 10/15/16 0432 10/16/16 0223  AST 70* 44* 36  ALT 27 22 18   ALKPHOS 56 43 49  BILITOT 0.5 0.8 0.8  PROT 6.3* 5.0* 5.1*  ALBUMIN 2.9* 2.5* 2.4*   No results for input(s): LIPASE, AMYLASE in the last 168 hours. No results for input(s): AMMONIA in the last 168 hours. CBC:  Recent Labs Lab 10/14/16 1610 10/15/16 0432 10/16/16 0223  WBC 6.9 6.2 7.9  NEUTROABS 5.6  --   --   HGB 12.6* 10.4* 10.2*  HCT 37.9* 31.1* 30.8*  MCV 90.9 89.1 90.1  PLT 175 154 151   Cardiac Enzymes:  Recent Labs Lab 10/14/16 2045 10/15/16 0215  CKTOTAL 267  --   TROPONINI  --  0.46*   BNP: Invalid input(s): POCBNP CBG:  Recent Labs Lab 10/16/16 1214 10/16/16 1629 10/16/16 2106 10/17/16 0725 10/17/16 1126  GLUCAP 110* 104* 110* 68 107*   D-Dimer No results for input(s): DDIMER in the last 72 hours. Hgb A1c No results for input(s): HGBA1C in the last 72 hours. Lipid Profile No results for input(s): CHOL, HDL, LDLCALC, TRIG, CHOLHDL, LDLDIRECT in the last 72 hours. Thyroid function studies No results for input(s): TSH, T4TOTAL, T3FREE, THYROIDAB in the last 72 hours.  Invalid input(s): FREET3 Anemia work up No results for input(s): VITAMINB12, FOLATE, FERRITIN, TIBC, IRON, RETICCTPCT in the last 72 hours. Urinalysis    Component Value Date/Time   COLORURINE YELLOW 10/14/2016 2045   APPEARANCEUR HAZY (A) 10/14/2016 2045   LABSPEC 1.012 10/14/2016 2045   PHURINE 5.0 10/14/2016 2045   GLUCOSEU NEGATIVE 10/14/2016 2045   GLUCOSEU NEGATIVE 12/13/2015 1559   HGBUR NEGATIVE 10/14/2016 2045   HGBUR large 08/03/2008 1258   BILIRUBINUR NEGATIVE 10/14/2016 2045   BILIRUBINUR neg 04/09/2014 1056   KETONESUR NEGATIVE 10/14/2016 2045   PROTEINUR NEGATIVE 10/14/2016 2045   UROBILINOGEN 0.2 12/13/2015 1559   NITRITE NEGATIVE 10/14/2016 2045   LEUKOCYTESUR NEGATIVE 10/14/2016 2045   Sepsis Labs Invalid  input(s): PROCALCITONIN,  WBC,  LACTICIDVEN Microbiology Recent Results (from the past 240 hour(s))  Blood culture (routine x 2)     Status: None (Preliminary result)   Collection Time: 10/14/16  4:18 PM  Result Value Ref Range Status   Specimen Description BLOOD LEFT FOREARM  Final   Special Requests IN PEDIATRIC BOTTLE Blood Culture adequate volume  Final   Culture NO GROWTH 2 DAYS  Final   Report Status PENDING  Incomplete  Blood culture (routine x 2)     Status: None (Preliminary result)   Collection Time: 10/14/16  7:26 PM  Result Value Ref Range Status   Specimen Description BLOOD RIGHT HAND  Final   Special Requests IN PEDIATRIC BOTTLE Blood Culture adequate volume  Final   Culture NO GROWTH 2 DAYS  Final   Report Status PENDING  Incomplete     Time coordinating discharge: Over 30 minutes  SIGNED:   Louellen Molder, MD  [facility] 10/17/2016, 12:00 PM Pager   If 7PM-7AM, please contact night-coverage [email or web address] Password [email or web address]

## 2016-10-17 NOTE — Progress Notes (Signed)
[id number] RN Visit @ [phone] AM  This is a GIP, related and covered hospital admission of 10/14/16 with a [facility] Dx of Heart Failure, per [facility] Dr. Cherie Ouch. Patient was transferred to [facility] via ambulance after patient noted with respiratory distress and changes in responsiveness in the home.  Patient was admitted for treatment of PNA.  Visited with patient in his room.   No other family/visitors was in the room at this time.   Patient was sitting up in bed and eating breakfast without assist.  Patient was alert and oriented to person, place.  Patient expressed "I'm doing much better and I'm ready to go home".   Patient had eaten approximately 35% of meal while I was in the room and still working on it.   Patient denies any pain.  Patient NAD at this time.  Patient still on 2L O2 [place] and sats were 97% while eating.   Patient currently receiving:  Continuous medications:  Levofloxacin (LEVAQUIN) IVPB 750 mg, Dose 750 mg, Q48 H via IV.  Last dose was 10/16/16 and next dose to follow on 10/18/16 at 0800.  Patient has not received any PRN medications today thus far.   [facility] will continue to follow daily during patient hospitalization. Please feel free to call for Hospice related support as needed.   Thank you,  Edyth Gunnels, RN, BSN [facility] Liaison [phone]  All hospital liaisons are now on [place].

## 2016-10-17 NOTE — Progress Notes (Addendum)
Attempted x 2 to contact [name] (daughter) for time for ambulance transportation home - no answer. CM will continue to try to reach the daughter. Mindi Slicker [facility] [phone]  3:02 pm- patient's family arrived to the hospital. [facility] called as requested for transportation home - address verified - [place], [place]. Mindi Slicker RN,MHA,BSN

## 2016-10-19 ENCOUNTER — Telehealth: Payer: Self-pay | Admitting: Neurology

## 2016-10-19 ENCOUNTER — Telehealth: Payer: Self-pay | Admitting: *Deleted

## 2016-10-19 ENCOUNTER — Telehealth: Payer: Self-pay | Admitting: Internal Medicine

## 2016-10-19 LAB — CULTURE, BLOOD (ROUTINE X 2)
CULTURE: NO GROWTH
Culture: NO GROWTH
SPECIAL REQUESTS: ADEQUATE
SPECIAL REQUESTS: ADEQUATE

## 2016-10-19 NOTE — Telephone Encounter (Signed)
Called pt/daughter ([name]) to make TCM hops f/u appt daughter states she will haven to make the appt later when dad is better. He can not walk, hardly can stand. The hospice nurse came out yesterday was able to help him stand for a min. He is starting to eat now after being home. Daughter states will hold off on make appt. Will call back once he is able to walk...Russell [name]

## 2016-10-19 NOTE — Telephone Encounter (Signed)
FMLA forms received via fax. For "Kandis Nab- Coralyn Mark" which is the patients daughter. I have reached out to her and LVM to call office back. I am unclear what she needing these forms. &How offend she is needing to be out.

## 2016-10-19 NOTE — Telephone Encounter (Signed)
Russell [name] called and said he just got out of the hospital and can't walk so she cancelled his appointment for Wednesday but wanted to know about his medication refill but she could not remember the name of the medication and said to call  [facility] she handles the medication

## 2016-10-22 NOTE — Telephone Encounter (Signed)
Russell [name] called and wants to know if he should stay on his medication but did not know the name and needs a call back

## 2016-10-22 NOTE — Telephone Encounter (Signed)
Returned call.  No answer.  LMOM asking Gaynelle to return my call so I can answer these questions for her.

## 2016-10-23 NOTE — Telephone Encounter (Signed)
Spoke with [employer]. She states that since her dad was in the hospital on 6/17 he has been bed ridden. She is asking for 4 days a month, she works 12 hours shifts. She states between her & her sister they take care of his needs. Please advise on the FMLA. Thank you.

## 2016-10-23 NOTE — Telephone Encounter (Signed)
Okay for FMLA - pt is a hospice pt.

## 2016-10-23 NOTE — Telephone Encounter (Signed)
I have called Russell [name] again. No answer. I leave a VM to inform her I can not fill this paperwork out until I speak with her. I do not have any information on what this FMLA is for. Please give call to me if she calls.

## 2016-10-24 ENCOUNTER — Ambulatory Visit: Payer: Self-pay | Admitting: Neurology

## 2016-10-24 NOTE — Telephone Encounter (Signed)
LMOM for Gaynelle to contact our office and speak with me to get this figured out.  Third attempt, awaiting pt/family to reach out to [place].

## 2016-10-26 NOTE — Telephone Encounter (Signed)
Forms have been completed. Waiting on MD's signature.

## 2016-10-29 ENCOUNTER — Telehealth: Payer: Self-pay | Admitting: *Deleted

## 2016-10-29 ENCOUNTER — Other Ambulatory Visit: Payer: Self-pay | Admitting: Internal Medicine

## 2016-10-29 DIAGNOSIS — M159 Polyosteoarthritis, unspecified: Secondary | ICD-10-CM

## 2016-10-29 DIAGNOSIS — Z0279 Encounter for issue of other medical certificate: Secondary | ICD-10-CM

## 2016-10-29 DIAGNOSIS — M150 Primary generalized (osteo)arthritis: Principal | ICD-10-CM

## 2016-10-29 MED ORDER — HYDROCODONE-ACETAMINOPHEN 5-325 MG PO TABS: 1.0000 | ORAL_TABLET | Freq: Four times a day (QID) | ORAL | 0 refills | Status: DC | PRN

## 2016-10-29 NOTE — Telephone Encounter (Signed)
done

## 2016-10-29 NOTE — Telephone Encounter (Signed)
Forms signed. Faxed 7/2, copy sent to scan, original ready for pick up. Charged for.

## 2016-10-29 NOTE — Telephone Encounter (Signed)
Faxed script to [facility] [place],,,/lmb

## 2016-10-29 NOTE — Telephone Encounter (Signed)
Rec'd call nurse states pt is needing a refill on his Hydrocodone 5/325 mg faxed to his [facility] pharmacy. Pls note Hospice Patient on rx so pharmacy will accept...Russell [name]

## 2016-10-30 ENCOUNTER — Telehealth: Payer: Self-pay

## 2016-11-01 MED ORDER — ISOSORBIDE DINITRATE 20 MG PO TABS: 20.0000 mg | ORAL_TABLET | Freq: Three times a day (TID) | ORAL | Status: DC

## 2016-11-01 NOTE — Telephone Encounter (Signed)
Abigail Butts with hospice called and wanted to confirm the medications that is taking.   Hospice would not cover the bidil so PCP changed to the two generics that make up the Brand.   This has been faxed to Abigail Butts to confirm medications that patient is on.

## 2016-11-09 NOTE — Anesthesia Postprocedure Evaluation (Signed)
Anesthesia Post Note  Patient: Russell [name]  Procedure(s) Performed: Procedure(s) (LRB): ESOPHAGOGASTRODUODENOSCOPY (EGD) WITH PROPOFOL (N/A)     Anesthesia Post Evaluation  Last Vitals:  Vitals:   08/04/16 0527 08/04/16 1400  BP: (!) 126/44 (!) 105/58  Pulse: 61 77  Resp: 20 18  Temp: 36.7 C 37.6 C    Last Pain:  Vitals:   08/04/16 1400  TempSrc: Oral                 Melroy Bougher Daimen

## 2016-11-09 NOTE — Addendum Note (Signed)
Addendum  created 11/09/16 1053 by Lyndle Herrlich, MD   Sign clinical note

## 2016-11-21 ENCOUNTER — Inpatient Hospital Stay (HOSPITAL_COMMUNITY)
Admission: EM | Admit: 2016-11-21 | Discharge: 2016-11-25 | DRG: 871 | Disposition: A | Discharge status: Hospice | Attending: Internal Medicine | Admitting: Internal Medicine

## 2016-11-21 ENCOUNTER — Emergency Department (HOSPITAL_COMMUNITY)

## 2016-11-21 ENCOUNTER — Encounter (HOSPITAL_COMMUNITY): Payer: Self-pay | Admitting: Nurse Practitioner

## 2016-11-21 ENCOUNTER — Telehealth: Payer: Self-pay | Admitting: Internal Medicine

## 2016-11-21 DIAGNOSIS — Z515 Encounter for palliative care: Secondary | ICD-10-CM | POA: Diagnosis not present

## 2016-11-21 DIAGNOSIS — I130 Hypertensive heart and chronic kidney disease with heart failure and stage 1 through stage 4 chronic kidney disease, or unspecified chronic kidney disease: Secondary | ICD-10-CM | POA: Diagnosis present

## 2016-11-21 DIAGNOSIS — K565 Intestinal adhesions [bands], unspecified as to partial versus complete obstruction: Secondary | ICD-10-CM | POA: Diagnosis present

## 2016-11-21 DIAGNOSIS — Z79899 Other long term (current) drug therapy: Secondary | ICD-10-CM | POA: Diagnosis not present

## 2016-11-21 DIAGNOSIS — J189 Pneumonia, unspecified organism: Secondary | ICD-10-CM

## 2016-11-21 DIAGNOSIS — F0391 Unspecified dementia with behavioral disturbance: Secondary | ICD-10-CM | POA: Diagnosis present

## 2016-11-21 DIAGNOSIS — N183 Chronic kidney disease, stage 3 unspecified: Secondary | ICD-10-CM | POA: Diagnosis present

## 2016-11-21 DIAGNOSIS — Z85038 Personal history of other malignant neoplasm of large intestine: Secondary | ICD-10-CM

## 2016-11-21 DIAGNOSIS — K56609 Unspecified intestinal obstruction, unspecified as to partial versus complete obstruction: Secondary | ICD-10-CM | POA: Diagnosis not present

## 2016-11-21 DIAGNOSIS — M19049 Primary osteoarthritis, unspecified hand: Secondary | ICD-10-CM | POA: Diagnosis present

## 2016-11-21 DIAGNOSIS — L8990 Pressure ulcer of unspecified site, unspecified stage: Secondary | ICD-10-CM | POA: Insufficient documentation

## 2016-11-21 DIAGNOSIS — E1122 Type 2 diabetes mellitus with diabetic chronic kidney disease: Secondary | ICD-10-CM | POA: Diagnosis present

## 2016-11-21 DIAGNOSIS — Z7401 Bed confinement status: Secondary | ICD-10-CM

## 2016-11-21 DIAGNOSIS — E119 Type 2 diabetes mellitus without complications: Secondary | ICD-10-CM

## 2016-11-21 DIAGNOSIS — E860 Dehydration: Secondary | ICD-10-CM | POA: Diagnosis present

## 2016-11-21 DIAGNOSIS — Z66 Do not resuscitate: Secondary | ICD-10-CM | POA: Diagnosis present

## 2016-11-21 DIAGNOSIS — H9190 Unspecified hearing loss, unspecified ear: Secondary | ICD-10-CM | POA: Diagnosis present

## 2016-11-21 DIAGNOSIS — E440 Moderate protein-calorie malnutrition: Secondary | ICD-10-CM | POA: Insufficient documentation

## 2016-11-21 DIAGNOSIS — E872 Acidosis, unspecified: Secondary | ICD-10-CM | POA: Diagnosis present

## 2016-11-21 DIAGNOSIS — Z87891 Personal history of nicotine dependence: Secondary | ICD-10-CM

## 2016-11-21 DIAGNOSIS — E43 Unspecified severe protein-calorie malnutrition: Secondary | ICD-10-CM | POA: Diagnosis present

## 2016-11-21 DIAGNOSIS — Z7982 Long term (current) use of aspirin: Secondary | ICD-10-CM

## 2016-11-21 DIAGNOSIS — A419 Sepsis, unspecified organism: Principal | ICD-10-CM | POA: Diagnosis present

## 2016-11-21 DIAGNOSIS — J690 Pneumonitis due to inhalation of food and vomit: Secondary | ICD-10-CM | POA: Diagnosis not present

## 2016-11-21 DIAGNOSIS — J9621 Acute and chronic respiratory failure with hypoxia: Secondary | ICD-10-CM | POA: Diagnosis present

## 2016-11-21 DIAGNOSIS — Z8546 Personal history of malignant neoplasm of prostate: Secondary | ICD-10-CM

## 2016-11-21 DIAGNOSIS — R54 Age-related physical debility: Secondary | ICD-10-CM | POA: Diagnosis present

## 2016-11-21 DIAGNOSIS — R918 Other nonspecific abnormal finding of lung field: Secondary | ICD-10-CM | POA: Diagnosis not present

## 2016-11-21 DIAGNOSIS — F03B18 Unspecified dementia, moderate, with other behavioral disturbance: Secondary | ICD-10-CM | POA: Diagnosis present

## 2016-11-21 DIAGNOSIS — F418 Other specified anxiety disorders: Secondary | ICD-10-CM | POA: Diagnosis present

## 2016-11-21 DIAGNOSIS — J9601 Acute respiratory failure with hypoxia: Secondary | ICD-10-CM | POA: Diagnosis present

## 2016-11-21 DIAGNOSIS — I5022 Chronic systolic (congestive) heart failure: Secondary | ICD-10-CM | POA: Diagnosis present

## 2016-11-21 DIAGNOSIS — R1111 Vomiting without nausea: Secondary | ICD-10-CM | POA: Diagnosis not present

## 2016-11-21 DIAGNOSIS — R112 Nausea with vomiting, unspecified: Secondary | ICD-10-CM

## 2016-11-21 DIAGNOSIS — K56600 Partial intestinal obstruction, unspecified as to cause: Secondary | ICD-10-CM | POA: Diagnosis not present

## 2016-11-21 DIAGNOSIS — Z6821 Body mass index (BMI) 21.0-21.9, adult: Secondary | ICD-10-CM

## 2016-11-21 LAB — URINALYSIS, ROUTINE W REFLEX MICROSCOPIC
GLUCOSE, UA: NEGATIVE mg/dL
Hgb urine dipstick: NEGATIVE
KETONES UR: 5 mg/dL — AB
NITRITE: NEGATIVE
PH: 5 (ref 5.0–8.0)
Protein, ur: 30 mg/dL — AB
Specific Gravity, Urine: 1.018 (ref 1.005–1.030)

## 2016-11-21 LAB — COMPREHENSIVE METABOLIC PANEL
ALK PHOS: 103 U/L (ref 38–126)
ALT: 12 U/L — ABNORMAL LOW (ref 17–63)
ANION GAP: 16 — AB (ref 5–15)
AST: 37 U/L (ref 15–41)
Albumin: 3.2 g/dL — ABNORMAL LOW (ref 3.5–5.0)
BILIRUBIN TOTAL: 0.7 mg/dL (ref 0.3–1.2)
BUN: 26 mg/dL — ABNORMAL HIGH (ref 6–20)
CALCIUM: 9 mg/dL (ref 8.9–10.3)
CO2: 29 mmol/L (ref 22–32)
Chloride: 96 mmol/L — ABNORMAL LOW (ref 101–111)
Creatinine, Ser: 1.98 mg/dL — ABNORMAL HIGH (ref 0.61–1.24)
GFR, EST AFRICAN AMERICAN: 32 mL/min — AB (ref >60)
GFR, EST NON AFRICAN AMERICAN: 28 mL/min — AB (ref >60)
Glucose, Bld: 166 mg/dL — ABNORMAL HIGH (ref 65–99)
Potassium: 4.6 mmol/L (ref 3.5–5.1)
Sodium: 141 mmol/L (ref 135–145)
TOTAL PROTEIN: 7.1 g/dL (ref 6.5–8.1)

## 2016-11-21 LAB — CBC
HCT: 42 % (ref 39.0–52.0)
HEMOGLOBIN: 14.2 g/dL (ref 13.0–17.0)
MCH: 29.8 pg (ref 26.0–34.0)
MCHC: 33.8 g/dL (ref 30.0–36.0)
MCV: 88.2 fL (ref 78.0–100.0)
Platelets: 243 10*3/uL (ref 150–400)
RBC: 4.76 MIL/uL (ref 4.22–5.81)
RDW: 15.1 % (ref 11.5–15.5)
WBC: 2.4 10*3/uL — ABNORMAL LOW (ref 4.0–10.5)

## 2016-11-21 LAB — MRSA PCR SCREENING: MRSA BY PCR: NEGATIVE

## 2016-11-21 LAB — I-STAT CG4 LACTIC ACID, ED
LACTIC ACID, VENOUS: 6.34 mmol/L — AB (ref 0.5–1.9)
Lactic Acid, Venous: 7.97 mmol/L (ref 0.5–1.9)

## 2016-11-21 LAB — GLUCOSE, CAPILLARY: Glucose-Capillary: 139 mg/dL — ABNORMAL HIGH (ref 65–99)

## 2016-11-21 LAB — LACTIC ACID, PLASMA
LACTIC ACID, VENOUS: 3.8 mmol/L — AB (ref 0.5–1.9)
Lactic Acid, Venous: 4.1 mmol/L (ref 0.5–1.9)

## 2016-11-21 LAB — LIPASE, BLOOD: Lipase: 19 U/L (ref 11–51)

## 2016-11-21 MED ORDER — ONDANSETRON HCL 4 MG/2ML IJ SOLN: 4.0000 mg | Freq: Four times a day (QID) | INTRAMUSCULAR | Status: DC | PRN

## 2016-11-21 MED ORDER — SODIUM CHLORIDE 0.9 % IV BOLUS (SEPSIS)
2000.0000 mL | Freq: Once | INTRAVENOUS | Status: AC
  Administered 2016-11-21: 2000 mL via INTRAVENOUS

## 2016-11-21 MED ORDER — DIVALPROEX SODIUM ER 250 MG PO TB24
250.0000 mg | ORAL_TABLET | Freq: Every day | ORAL | Status: DC
  Administered 2016-11-21 – 2016-11-22 (×2): 250 mg via ORAL
  Filled 2016-11-21 (×2): qty 1

## 2016-11-21 MED ORDER — ONDANSETRON HCL 4 MG/2ML IJ SOLN
4.0000 mg | Freq: Once | INTRAMUSCULAR | Status: AC
  Administered 2016-11-21: 4 mg via INTRAVENOUS
  Filled 2016-11-21: qty 2

## 2016-11-21 MED ORDER — VANCOMYCIN HCL IN DEXTROSE 1-5 GM/200ML-% IV SOLN: 1000.0000 mg | INTRAVENOUS | Status: DC

## 2016-11-21 MED ORDER — BISACODYL 10 MG RE SUPP
10.0000 mg | Freq: Every day | RECTAL | Status: DC
  Administered 2016-11-21 – 2016-11-22 (×2): 10 mg via RECTAL
  Filled 2016-11-21 (×2): qty 1

## 2016-11-21 MED ORDER — ALPRAZOLAM 0.5 MG PO TABS: 0.5000 mg | ORAL_TABLET | Freq: Three times a day (TID) | ORAL | Status: DC | PRN

## 2016-11-21 MED ORDER — VANCOMYCIN HCL IN DEXTROSE 1-5 GM/200ML-% IV SOLN
1000.0000 mg | Freq: Once | INTRAVENOUS | Status: AC
  Administered 2016-11-21: 1000 mg via INTRAVENOUS
  Filled 2016-11-21: qty 200

## 2016-11-21 MED ORDER — PIPERACILLIN-TAZOBACTAM 3.375 G IVPB 30 MIN
3.3750 g | Freq: Once | INTRAVENOUS | Status: AC
  Administered 2016-11-21: 3.375 g via INTRAVENOUS
  Filled 2016-11-21: qty 50

## 2016-11-21 MED ORDER — POLYETHYLENE GLYCOL 3350 17 G PO PACK
17.0000 g | PACK | Freq: Every day | ORAL | Status: DC
  Administered 2016-11-21: 17 g via ORAL
  Filled 2016-11-21: qty 1

## 2016-11-21 MED ORDER — ACETAMINOPHEN 650 MG RE SUPP
650.0000 mg | Freq: Four times a day (QID) | RECTAL | Status: DC | PRN
  Administered 2016-11-22 – 2016-11-23 (×2): 650 mg via RECTAL
  Filled 2016-11-21 (×2): qty 1

## 2016-11-21 MED ORDER — CARVEDILOL 3.125 MG PO TABS
3.1250 mg | ORAL_TABLET | Freq: Two times a day (BID) | ORAL | Status: DC
  Administered 2016-11-22: 3.125 mg via ORAL
  Filled 2016-11-21 (×2): qty 1

## 2016-11-21 MED ORDER — ACETAMINOPHEN 325 MG PO TABS: 650.0000 mg | ORAL_TABLET | Freq: Four times a day (QID) | ORAL | Status: DC | PRN

## 2016-11-21 MED ORDER — ENOXAPARIN SODIUM 30 MG/0.3ML ~~LOC~~ SOLN
30.0000 mg | SUBCUTANEOUS | Status: DC
  Administered 2016-11-21 – 2016-11-24 (×4): 30 mg via SUBCUTANEOUS
  Filled 2016-11-21 (×4): qty 0.3

## 2016-11-21 MED ORDER — PIPERACILLIN-TAZOBACTAM 3.375 G IVPB
3.3750 g | Freq: Three times a day (TID) | INTRAVENOUS | Status: DC
  Administered 2016-11-22 – 2016-11-25 (×11): 3.375 g via INTRAVENOUS
  Filled 2016-11-21 (×10): qty 50

## 2016-11-21 MED ORDER — SERTRALINE HCL 100 MG PO TABS
100.0000 mg | ORAL_TABLET | Freq: Every day | ORAL | Status: DC
  Administered 2016-11-21 – 2016-11-22 (×2): 100 mg via ORAL
  Filled 2016-11-21 (×2): qty 1

## 2016-11-21 MED ORDER — ASPIRIN EC 81 MG PO TBEC
81.0000 mg | DELAYED_RELEASE_TABLET | Freq: Every day | ORAL | Status: DC
  Administered 2016-11-21 – 2016-11-22 (×2): 81 mg via ORAL
  Filled 2016-11-21 (×2): qty 1

## 2016-11-21 MED ORDER — ACETAMINOPHEN 650 MG RE SUPP
650.0000 mg | Freq: Once | RECTAL | Status: AC
  Administered 2016-11-21: 650 mg via RECTAL
  Filled 2016-11-21: qty 1

## 2016-11-21 MED ORDER — SODIUM CHLORIDE 0.9 % IV SOLN
INTRAVENOUS | Status: DC
  Administered 2016-11-21: 19:00:00 via INTRAVENOUS

## 2016-11-21 NOTE — ED Notes (Signed)
Failed attempt to collect cultures

## 2016-11-21 NOTE — ED Notes (Signed)
Attempted once to collect labs unsuccessful. PT transfer to xray

## 2016-11-21 NOTE — Progress Notes (Signed)
[facility] Liaison: RN visit  Saw patient at bedside. No family at bedside. Patient did not verbalize today's medical complaints.  Dr. Rex Kras at bedside and we discussed that patient is DNR, but family wants the treatable treated per Barb Merino [facility] RN.   Will continue to follow.  Please contact me with any Hospice related questions or concerns.  [facility] Liaison [phone]

## 2016-11-21 NOTE — ED Triage Notes (Signed)
Pt arrived to ED via ems with c/o vomiting from Home and Hospice care due to CHF endstage. Per patient and family he had several vomiting episodes last night and now gurgling. The family wonders if he has aspirated. O2 sats 74%RA however he states he is generally 92-95% on RA with oxygen as needed. Per ems he has bilateral rhonchi, mental is baseline A&Ox3 with dementia, he is bedridden per family. VS 102/68, 99, 20, 94 4L/[place], CBG 211, 99.1oral temp. NSR. Pt denies pain when asked per ems. Pt has not vomited since this morning. Denies abd pain but is aware that he has abdominal hernia with some pertrusion. The hospice, RN gave him 10mg  of Compazine at 1030 which he tolerated well.

## 2016-11-21 NOTE — ED Provider Notes (Signed)
[facility] DEPT Provider Note   CSN: [id number] Arrival date & time: 11/21/16  1301     History   Chief Complaint Chief Complaint  Patient presents with  . Emesis  . Nausea    HPI Russell [name] is a 83 y.o. male.  83yo M w/ PMH including dementia, CHF, colon CA, T2DM,  currently on hospice care who p/w vomiting and possible aspiration. Family reported to EMS that the patient had several episodes of vomiting last night, last episode was this morning, and they have noticed some gurgling sounds when breathing. They were concerned that he may have aspirated. His oxygen saturation was 74% on room air, he is normally in the low 90s with oxygen as needed. Hospice nurse gave him Compazine at 10:30 AM.  LEVEL 5 CAVEAT DUE TO DEMENTIA   The history is provided by the EMS personnel and a relative.  Emesis      Past Medical History:  Diagnosis Date  . Anxiety associated with depression   . Chronic systolic CHF (congestive heart failure) ([place])    a. Echo 8/17: EF 20%, diff HK, ant-septal, ant-lateral inf severe HK, prominent trabeculation noted in LV apex, mod AI, trivial MR, trivial TR, small pericardial effusion // b. Limited Echo 8/17: Diff HK, inf and post AK, apical AK, mild LVH, EF 20-25%, mild AI, mild MR, mild LAE, mod to severe reduced RVSF, small pericardial effusion  . Colon cancer [facility])    s/p resection 12/06  . Dementia   . Diabetes mellitus    diet mgt (11`/12)  . FTT (failure to thrive)    admited 08/2008  . History of cystoscopy    uretheral stricutre, 2005- Dr.Humphreys  . Hyperlipidemia   . Hypertension   . Loss of hearing    being fitted for hearing aids (11/12)  . Osteoarthritis    hands  . Pneumonia   . Prostate cancer ([place])    s/p XRT  . SBO (small bowel obstruction) ([place]) 03/08/2014  . Urolithiasis     Patient Active Problem List   Diagnosis Date Noted  . Hyperkalemia 10/15/2016  . AKI (acute kidney injury) ([place]) 10/14/2016  . Anemia 10/14/2016    . HCAP (healthcare-associated pneumonia) 10/14/2016  . Recurrent inguinal hernia of right side without obstruction or gangrene 05/22/2016  . Cachexia associated with cardiac disease 02/01/2016  . Chronic systolic CHF (congestive heart failure) ([place]) 01/01/2016  . Depression with anxiety 01/01/2016  . CKD (chronic kidney disease), stage III 01/01/2016  . Elevated troponin I level   . Physical deconditioning   . Other seasonal allergic rhinitis 09/29/2015  . Diabetes mellitus type 2, diet-controlled ([place]) 07/19/2015  . Constipation 05/30/2015  . Cough 04/06/2015  . Routine general medical examination at a health care facility 01/27/2015  . Hyperglycemia 01/25/2015  . Moderate dementia with behavioral disturbance 11/05/2014  . DJD (degenerative joint disease) 02/20/2013  . Major depressive disorder, recurrent episode, severe, without mention of psychotic behavior 08/08/2011  . PVC's (premature ventricular contractions) 01/31/2009  . URINARY INCONTINENCE 07/16/2008  . ADENOCARCINOMA, COLON 05/13/2006  . ADENOCARCINOMA, PROSTATE 05/13/2006  . Hyperlipidemia with target LDL less than 100 05/13/2006  . Essential hypertension, benign 05/13/2006    Past Surgical History:  Procedure Laterality Date  . CATARACT EXTRACTION, BILATERAL    . ESOPHAGOGASTRODUODENOSCOPY (EGD) WITH PROPOFOL N/A 08/03/2016   Procedure: ESOPHAGOGASTRODUODENOSCOPY (EGD) WITH PROPOFOL;  Surgeon: Wonda Horner, MD;  Location: [facility] ENDOSCOPY;  Service: Endoscopy;  Laterality: N/A;  . HEMICOLECTOMY  12/06  .  TRANSURETHRAL RESECTION OF PROSTATE    . xrt     ERT for prostate ca       Home Medications    Prior to Admission medications   Medication Sig Start Date End Date Taking? Authorizing Provider  ALPRAZolam (XANAX) 0.5 MG tablet TAKE 1 TABLET BY MOUTH THREE TIMES DAILY AS NEEDED FOR ANXIETY Patient taking differently: TAKE 0.5 MG BY MOUTH TWICE DAILY 09/26/16   Janith Lima, MD  aspirin 81 MG tablet Take 81 mg  by mouth daily.      [provider]  BIDIL 20-37.5 MG tablet Take 1 tablet by mouth 3 (three) times daily. 09/22/16   [provider]  carvedilol (COREG) 3.125 MG tablet TAKE 1 TABLET(3.125 MG) BY MOUTH TWICE DAILY WITH A MEAL 04/17/16   Janith Lima, MD  Cyanocobalamin (VITAMIN B-12 PO) Take 1 tablet by mouth daily with breakfast.    [provider]  divalproex (DEPAKOTE ER) 250 MG 24 hr tablet Take 1 tablet (250 mg total) by mouth daily. 02/29/16   Cameron Sprang, MD  DULCOLAX 10 MG suppository Place 1 suppository rectally daily. Unwrap and insert 1 suppository rectally once daily if not bowel movement  In 3 days 09/17/16   [provider]  Elastic Bandages & Supports ([place]) MISC Use as needed for Hernia pain. Patient not taking: Reported on 10/15/2016 09/03/16   Janith Lima, MD  fesoterodine (TOVIAZ) 8 MG TB24 tablet Take 1 tablet (8 mg total) by mouth daily. 05/13/14   Janith Lima, MD  hydrALAZINE (APRESOLINE) 25 MG tablet Take 1 tablet (25 mg total) by mouth 3 (three) times daily. 10/01/16   Janith Lima, MD  HYDROcodone-acetaminophen (NORCO/VICODIN) 5-325 MG tablet Take 1 tablet by mouth every 6 (six) hours as needed for moderate pain. 10/29/16   Janith Lima, MD  HYDROMET 5-1.5 MG/5ML syrup Take 5 mLs by mouth as needed for cough. 10/04/16   [provider]  isosorbide dinitrate (ISORDIL) 20 MG tablet Take 1 tablet (20 mg total) by mouth 3 (three) times daily. 11/01/16   Janith Lima, MD  polyethylene glycol (MIRALAX / Floria Raveling) packet Take 17 g by mouth daily. Patient taking differently: Take 17 g by mouth daily as needed (constipation).  03/30/16   Janith Lima, MD  potassium chloride SA (K-DUR,KLOR-CON) 20 MEQ tablet Take 1 tablet (20 mEq total) by mouth daily. 03/27/16   Janith Lima, MD  prochlorperazine (COMPAZINE) 10 MG tablet Take 1 tablet (10 mg total) by mouth every 4 (four) hours. Patient taking  differently: Take 10 mg by mouth every 8 (eight) hours as needed for nausea or vomiting.  09/03/16   Janith Lima, MD  promethazine-dextromethorphan (PROMETHAZINE-DM) 6.25-15 MG/5ML syrup Take 5 mLs by mouth as needed for cough. 08/17/16   [provider]  SENEXON-S 8.6-50 MG tablet Take 2 tablets by mouth every day 07/21/16   [provider]  sertraline (ZOLOFT) 100 MG tablet TAKE 1 TABLET BY MOUTH DAILY Patient taking differently: TAKE 100 mg TABLET BY MOUTH DAILY 04/01/16   Janith Lima, MD  torsemide (DEMADEX) 20 MG tablet Take 1 tablet (20 mg total) by mouth daily. 10/19/16   Dhungel, Flonnie Overman, MD    Family History Family History  Problem Relation Age of Onset  . Diabetes Daughter   . Cancer Neg Hx   . Early death Neg Hx   . Heart disease Neg Hx   . Hyperlipidemia  Neg Hx   . Hypertension Neg Hx   . Kidney disease Neg Hx   . Stroke Neg Hx   . Heart attack Neg Hx     Social History Social History  Substance Use Topics  . Smoking status: Former Smoker    Quit date: 03/20/1967  . Smokeless tobacco: Never Used  . Alcohol use No     Comment: quit '68     Allergies   Patient has no known allergies.   Review of Systems Review of Systems  Unable to perform ROS: Dementia  Gastrointestinal: Positive for vomiting.     Physical Exam Updated Vital Signs BP (!) 142/66   Pulse 87   Temp (!) 100.5 F (38.1 C) (Rectal)   Resp 18   Ht 5\' 6"  (1.676 m)   Wt 68 kg (150 lb)   SpO2 94%   BMI 24.21 kg/m   Physical Exam  Constitutional: No distress.  Frail, cachectic elderly man awake and comfortable  HENT:  Head: Normocephalic and atraumatic.  Moist mucous membranes  Eyes: Pupils are equal, round, and reactive to light. Conjunctivae are normal.  Neck: Neck supple.  Cardiovascular: Normal rate, regular rhythm and normal heart sounds.   No murmur heard. Distant heart sounds  Pulmonary/Chest: Effort normal.  Diminished BS b/l, wet cough  Abdominal:  Soft. Bowel sounds are normal. He exhibits no distension. There is no tenderness.  Incisional hernia at midepigastrium easily reducible with no tenderness  Musculoskeletal: He exhibits no edema.  Neurological: He is alert.  Confused, able to follow some basic commands  Skin: Skin is warm and dry.  Nursing note and vitals reviewed.    ED Treatments / Results  Labs (all labs ordered are listed, but only abnormal results are displayed) Labs Reviewed  COMPREHENSIVE METABOLIC PANEL - Abnormal; Notable for the following:       Result Value   Chloride 96 (*)    Glucose, Bld 166 (*)    BUN 26 (*)    Creatinine, Ser 1.98 (*)    Albumin 3.2 (*)    ALT 12 (*)    GFR calc non Af Amer 28 (*)    GFR calc Af Amer 32 (*)    Anion gap 16 (*)    All other components within normal limits  CBC - Abnormal; Notable for the following:    WBC 2.4 (*)    All other components within normal limits  URINALYSIS, ROUTINE W REFLEX MICROSCOPIC - Abnormal; Notable for the following:    Color, Urine AMBER (*)    APPearance CLOUDY (*)    Bilirubin Urine SMALL (*)    Ketones, ur 5 (*)    Protein, ur 30 (*)    Leukocytes, UA SMALL (*)    Bacteria, UA FEW (*)    Squamous Epithelial / LPF 0-5 (*)    All other components within normal limits  I-STAT CG4 LACTIC ACID, ED - Abnormal; Notable for the following:    Lactic Acid, Venous 7.97 (*)    All other components within normal limits  CULTURE, BLOOD (ROUTINE X 2)  CULTURE, BLOOD (ROUTINE X 2)  URINE CULTURE  LIPASE, BLOOD    EKG  EKG Interpretation  Date/Time:  Wednesday November 21 2016 13:17:19 EDT Ventricular Rate:  101 PR Interval:    QRS Duration: 86 QT Interval:  330 QTC Calculation: 428 R Axis:   67 Text Interpretation:  Atrial flutter Ventricular premature complex Nonspecific repol abnormality, diffuse leads Borderline ST elevation, anterior  leads A flutter new from previous Confirmed by Theotis Burrow [phone]) on 11/21/2016 3:27:20 PM        Radiology Dg Chest 2 View  Result Date: 11/21/2016 CLINICAL DATA:  83 year old male with a history of congestive heart failure. Possible aspiration. EXAM: CHEST  2 VIEW COMPARISON:  10/14/2016, 08/01/2016, 01/12/2016 FINDINGS: Cardiomediastinal silhouette unchanged. Calcifications of the aortic arch. No pneumothorax. Anterior view demonstrates airspace opacity at the medial right base, retrocardiac opacity, and ill-defined opacity at the posterior lung base on the lateral view. No displaced fracture IMPRESSION: Ill-defined airspace opacity at the bilateral lung bases, potentially related to aspiration. Electronically Signed   By: Corrie Mckusick D.O.   On: 11/21/2016 15:17    Procedures .Critical Care Performed by: Sharlett Iles Authorized by: Sharlett Iles   Critical care provider statement:    Critical care time (minutes):  40   Critical care time was exclusive of:  Separately billable procedures and treating other patients   Critical care was necessary to treat or prevent imminent or life-threatening deterioration of the following conditions:  Sepsis   Critical care was time spent personally by me on the following activities:  Development of treatment plan with patient or surrogate, evaluation of patient's response to treatment, examination of patient, obtaining history from patient or surrogate, ordering and performing treatments and interventions, ordering and review of laboratory studies, ordering and review of radiographic studies, re-evaluation of patient's condition and review of old charts   (including critical care time)  Medications Ordered in ED Medications  vancomycin (VANCOCIN) IVPB 1000 mg/200 mL premix (1,000 mg Intravenous New Bag/Given 11/21/16 1558)  piperacillin-tazobactam (ZOSYN) IVPB 3.375 g (not administered)  vancomycin (VANCOCIN) IVPB 1000 mg/200 mL premix (not administered)  ondansetron (ZOFRAN) injection 4 mg (4 mg Intravenous Given 11/21/16 1405)   sodium chloride 0.9 % bolus 2,000 mL (2,000 mLs Intravenous New Bag/Given 11/21/16 1427)  piperacillin-tazobactam (ZOSYN) IVPB 3.375 g (0 g Intravenous Stopped 11/21/16 1558)  acetaminophen (TYLENOL) suppository 650 mg (650 mg Rectal Given 11/21/16 1549)     Initial Impression / Assessment and Plan / ED Course  I have reviewed the triage vital signs and the nursing notes.  Pertinent labs & imaging results that were available during my care of the patient were reviewed by me and considered in my medical decision making (see chart for details).     PT currently on hospice sent by EMS after family became concerned for Aspiration as he had several episodes of vomiting. He was comfortable on exam, O2 saturation mid 90s on 4 L. He did have a wet sounding cough and diminished breath sounds. Hospice nurses were at bedside but are unfamiliar with the patient's case and goals of care therefore I obtained chest x-ray and basic screening labs.  Initial lactate 7.97, urine negative for infection, creatinine 1.98, WBC 2.4, normal hemoglobin and, anion gap 16. Chest x-ray showed just bilateral basilar opacities and I suspect aspiration based on his story and previous history. I have activated a code sepsis with blood and urine cultures, antibiotics, and fluids, conversation with the daughter over the phone, who stated that they wanted all medical treatment short of intubation or CPR. He has remained stable on 4L [place], no respiratory distress, stable BP. Discussed admission w/ [facility], Dr. Tyrell Antonio, and pt admitted for further care. Final Clinical Impressions(s) / ED Diagnoses   Final diagnoses:  None    New Prescriptions New Prescriptions   No medications on file     Taresa [place],  Wenda Overland, MD 11/21/16 [phone]

## 2016-11-21 NOTE — Telephone Encounter (Signed)
Will forward to PCP for FYI...[name]

## 2016-11-21 NOTE — Progress Notes (Signed)
Pharmacy Antibiotic Note  Russell [name] is a 83 y.o. male admitted on 11/21/2016 with possible aspiration and  sepsis.  Pharmacy has been consulted for vancomycin and zosyn dosing.  Plan:  Vancomycin 1g IV x 1 in ED, then continue with 1g IV q48h  Check trough at steady state, goal = 15-20 mcg/ml  Zosyn 3.375g IV x 1 in ED, then continue with Zosyn 3.375gm IV q8h (4hr extended infusions)  Follow up renal function & cultures, adjust doses and needed and de-escalate as appropriate  Recommend checking MRSA PCR nasal swab and stopping vancomycin if negative since it has a high negative predictive value for MRSA pneumonia  Monitor SCr daily as patient is at risk for nephrotoxicity with concurrent vancomycin and Zosyn  Height: 5\' 6"  (167.6 cm) Weight: 150 lb (68 kg) IBW/kg (Calculated) : 63.8  Temp (24hrs), Avg:100.5 F (38.1 C), Min:100.5 F (38.1 C), Max:100.5 F (38.1 C)   Recent Labs Lab 11/21/16 1352 11/21/16 1406  WBC 2.4*  --   CREATININE 1.98*  --   LATICACIDVEN  --  7.97*    Estimated Creatinine Clearance: 21.9 mL/min (A) (by C-G formula based on SCr of 1.98 mg/dL (H)).    No Known Allergies  Antimicrobials this admission: 7/25 Vancomycin >> 7/25 Zosyn >>  Dose adjustments this admission:   Microbiology results: 7/25 BCx: sent 7/25 UCx: sent  Thank you for allowing pharmacy to be a part of this patient's care.  Peggyann Juba, PharmD, BCPS Pager: [phone] 11/21/2016 2:47 PM

## 2016-11-21 NOTE — ED Notes (Signed)
Bed: OP92 Expected date:  Expected time:  Means of arrival:  Comments: EMS vomiting/aspiration

## 2016-11-21 NOTE — ED Notes (Signed)
Attempted to call report to 2 [place].  RN taking another patient out and will call this RN back.

## 2016-11-21 NOTE — H&P (Addendum)
History and Physical    Russell [name] GGE:[id number] DOB: Jul 05, 1923 DOA: 11/21/2016  Referring MD/NP/PA: EDP PCP:  Patient coming from: Home  Chief Complaint: shortness of breath, cough, vomiting  HPI: Russell [name] is a 83 y.o. male with medical history significant for dementia, bedbound status, chronic systolic CHF EF of 46-50%, chronic respiratory failure on 2 liters of oxygen, chronic kidney disease stage III, diabetes mellitus, prostate cancer who is followed by hospice at home was brought to the emergency room by his daughter is due to increased shortness of breath, cough, nausea and vomiting since yesterday. They deny any fevers or chills however patient was noted to be febrile in the emergency room. ED Course: Noted to be febrile, mildly Initially and hypoxic requiring 4 L of oxygen stable blood pressure, lactic acid was significantly elevated at 7.9, chest x-ray notable for ill-defined opacities at both lung bases concerning for pneumonia  Review of Systems: Unable to obtain due to dementia  Past Medical History:  Diagnosis Date  . Anxiety associated with depression   . Chronic systolic CHF (congestive heart failure) ([place])    a. Echo 8/17: EF 20%, diff HK, ant-septal, ant-lateral inf severe HK, prominent trabeculation noted in LV apex, mod AI, trivial MR, trivial TR, small pericardial effusion // b. Limited Echo 8/17: Diff HK, inf and post AK, apical AK, mild LVH, EF 20-25%, mild AI, mild MR, mild LAE, mod to severe reduced RVSF, small pericardial effusion  . Colon cancer [facility])    s/p resection 12/06  . Dementia   . Diabetes mellitus    diet mgt (11`/12)  . FTT (failure to thrive)    admited 08/2008  . History of cystoscopy    uretheral stricutre, 2005- Dr.Humphreys  . Hyperlipidemia   . Hypertension   . Loss of hearing    being fitted for hearing aids (11/12)  . Osteoarthritis    hands  . Pneumonia   . Prostate cancer ([place])    s/p XRT  . SBO (small bowel obstruction)  ([place]) 03/08/2014  . Urolithiasis     Past Surgical History:  Procedure Laterality Date  . CATARACT EXTRACTION, BILATERAL    . ESOPHAGOGASTRODUODENOSCOPY (EGD) WITH PROPOFOL N/A 08/03/2016   Procedure: ESOPHAGOGASTRODUODENOSCOPY (EGD) WITH PROPOFOL;  Surgeon: Wonda Horner, MD;  Location: [facility];  Service: Endoscopy;  Laterality: N/A;  . HEMICOLECTOMY  12/06  . TRANSURETHRAL RESECTION OF PROSTATE    . xrt     ERT for prostate ca     reports that he quit smoking about 49 years ago. He has never used smokeless tobacco. He reports that he does not drink alcohol or use drugs.  No Known Allergies  Family History  Problem Relation Age of Onset  . Diabetes Daughter   . Cancer Neg Hx   . Early death Neg Hx   . Heart disease Neg Hx   . Hyperlipidemia Neg Hx   . Hypertension Neg Hx   . Kidney disease Neg Hx   . Stroke Neg Hx   . Heart attack Neg Hx      Prior to Admission medications   Medication Sig Start Date End Date Taking? Authorizing Provider  ALPRAZolam (XANAX) 0.5 MG tablet TAKE 1 TABLET BY MOUTH THREE TIMES DAILY AS NEEDED FOR ANXIETY Patient taking differently: TAKE 0.5 MG BY MOUTH TWICE DAILY 09/26/16   Janith Lima, MD  aspirin 81 MG tablet Take 81 mg by mouth daily.      [provider]  Beulah Gandy  20-37.5 MG tablet Take 1 tablet by mouth 3 (three) times daily. 09/22/16   [provider]  carvedilol (COREG) 3.125 MG tablet TAKE 1 TABLET(3.125 MG) BY MOUTH TWICE DAILY WITH A MEAL 04/17/16   Janith Lima, MD  Cyanocobalamin (VITAMIN B-12 PO) Take 1 tablet by mouth daily with breakfast.    [provider]  divalproex (DEPAKOTE ER) 250 MG 24 hr tablet Take 1 tablet (250 mg total) by mouth daily. 02/29/16   Cameron Sprang, MD  DULCOLAX 10 MG suppository Place 1 suppository rectally daily. Unwrap and insert 1 suppository rectally once daily if not bowel movement  In 3 days 09/17/16   [provider]  Elastic Bandages & Supports ([place]) MISC Use as needed for Hernia pain. Patient not taking: Reported on 10/15/2016 09/03/16   Janith Lima, MD  fesoterodine (TOVIAZ) 8 MG TB24 tablet Take 1 tablet (8 mg total) by mouth daily. 05/13/14   Janith Lima, MD  hydrALAZINE (APRESOLINE) 25 MG tablet Take 1 tablet (25 mg total) by mouth 3 (three) times daily. 10/01/16   Janith Lima, MD  HYDROcodone-acetaminophen (NORCO/VICODIN) 5-325 MG tablet Take 1 tablet by mouth every 6 (six) hours as needed for moderate pain. 10/29/16   Janith Lima, MD  HYDROMET 5-1.5 MG/5ML syrup Take 5 mLs by mouth as needed for cough. 10/04/16   [provider]  isosorbide dinitrate (ISORDIL) 20 MG tablet Take 1 tablet (20 mg total) by mouth 3 (three) times daily. 11/01/16   Janith Lima, MD  polyethylene glycol (MIRALAX / Floria Raveling) packet Take 17 g by mouth daily. Patient taking differently: Take 17 g by mouth daily as needed (constipation).  03/30/16   Janith Lima, MD  potassium chloride SA (K-DUR,KLOR-CON) 20 MEQ tablet Take 1 tablet (20 mEq total) by mouth daily. 03/27/16   Janith Lima, MD  prochlorperazine (COMPAZINE) 10 MG tablet Take 1 tablet (10 mg total) by mouth every 4 (four) hours. Patient taking differently: Take 10 mg by mouth every 8 (eight) hours as needed for nausea or vomiting.  09/03/16   Janith Lima, MD  promethazine-dextromethorphan (PROMETHAZINE-DM) 6.25-15 MG/5ML syrup Take 5 mLs by mouth as needed for cough. 08/17/16   [provider]  SENEXON-S 8.6-50 MG tablet Take 2 tablets by mouth every day 07/21/16   [provider]  sertraline (ZOLOFT) 100 MG tablet TAKE 1 TABLET BY MOUTH DAILY Patient taking differently: TAKE 100 mg TABLET BY MOUTH DAILY 04/01/16   Janith Lima, MD  torsemide (DEMADEX) 20 MG tablet Take 1 tablet (20 mg total) by mouth daily. 10/19/16   Louellen Molder, MD    Physical Exam: Vitals:   11/21/16 1615 11/21/16 1630 11/21/16 1645 11/21/16 1650  BP:  117/61    Pulse:  87 88 90   Resp: 18 19 (!) 23   Temp:    100 F (37.8 C)  TempSrc:    Oral  SpO2: 94% 95% 94%   Weight:      Height:          Constitutional: Elderly frail, cachectic appearing African-American male lying in bed, no distress, answers simple questions is oriented to self and place only Vitals:   11/21/16 1615 11/21/16 1630 11/21/16 1645 11/21/16 1650  BP:  117/61    Pulse: 87 88 90   Resp: 18 19 (!) 23   Temp:    100 F (37.8 C)  TempSrc:    Oral  SpO2: 94% 95% 94%   Weight:      Height:       Eyes: PERRL ENMT: Poor dental hygiene, mucous membranes dry Respiratory: Poor air movement, few scattered rhonchi Cardiovascular: S1-S2 regular, tachycardic, no murmurs appreciated.  Abdomen: Soft scaphoid nontender and nondistended, bowel sounds present Musculoskeletal: no clubbing / cyanosis.  Skin: Some hyperpigmentation noted of the joints Neurologic: Moves all 4 extremities, no overt localizing signs noted Psychiatric: Flat affect, alert and awake  Labs on Admission: I have personally reviewed following labs and imaging studies  CBC:  Recent Labs Lab 11/21/16 1352  WBC 2.4*  HGB 14.2  HCT 42.0  MCV 88.2  PLT 732   Basic Metabolic Panel:  Recent Labs Lab 11/21/16 1352  NA 141  K 4.6  CL 96*  CO2 29  GLUCOSE 166*  BUN 26*  CREATININE 1.98*  CALCIUM 9.0   GFR: Estimated Creatinine Clearance: 21.9 mL/min (A) (by C-G formula based on SCr of 1.98 mg/dL (H)). Liver Function Tests:  Recent Labs Lab 11/21/16 1352  AST 37  ALT 12*  ALKPHOS 103  BILITOT 0.7  PROT 7.1  ALBUMIN 3.2*    Recent Labs Lab 11/21/16 1352  LIPASE 19   No results for input(s): AMMONIA in the last 168 hours. Coagulation Profile: No results for input(s): INR, PROTIME in the last 168 hours. Cardiac Enzymes: No results for input(s): CKTOTAL, CKMB, CKMBINDEX, TROPONINI in the last 168 hours. BNP (last 3 results) No results for input(s): PROBNP in the last 8760 hours. HbA1C: No  results for input(s): HGBA1C in the last 72 hours. CBG: No results for input(s): GLUCAP in the last 168 hours. Lipid Profile: No results for input(s): CHOL, HDL, LDLCALC, TRIG, CHOLHDL, LDLDIRECT in the last 72 hours. Thyroid Function Tests: No results for input(s): TSH, T4TOTAL, FREET4, T3FREE, THYROIDAB in the last 72 hours. Anemia Panel: No results for input(s): VITAMINB12, FOLATE, FERRITIN, TIBC, IRON, RETICCTPCT in the last 72 hours. Urine analysis:    Component Value Date/Time   COLORURINE AMBER (A) 11/21/2016 1427   APPEARANCEUR CLOUDY (A) 11/21/2016 1427   LABSPEC 1.018 11/21/2016 1427   PHURINE 5.0 11/21/2016 1427   GLUCOSEU NEGATIVE 11/21/2016 1427   GLUCOSEU NEGATIVE 12/13/2015 1559   HGBUR NEGATIVE 11/21/2016 1427   HGBUR large 08/03/2008 1258   BILIRUBINUR SMALL (A) 11/21/2016 1427   BILIRUBINUR neg 04/09/2014 1056   KETONESUR 5 (A) 11/21/2016 1427   PROTEINUR 30 (A) 11/21/2016 1427   UROBILINOGEN 0.2 12/13/2015 1559   NITRITE NEGATIVE 11/21/2016 1427   LEUKOCYTESUR SMALL (A) 11/21/2016 1427   Sepsis Labs: @LABRCNTIP (procalcitonin:4,lacticidven:4) )No results found for this or any previous visit (from the past 240 hour(s)).   Radiological Exams on Admission: Dg Chest 2 View  Result Date: 11/21/2016 CLINICAL DATA:  83 year old male with a history of congestive heart failure. Possible aspiration. EXAM: CHEST  2 VIEW COMPARISON:  10/14/2016, 08/01/2016, 01/12/2016 FINDINGS: Cardiomediastinal silhouette unchanged. Calcifications of the aortic arch. No pneumothorax. Anterior view demonstrates airspace opacity at the medial right base, retrocardiac opacity, and ill-defined opacity at the posterior lung base on the lateral view. No displaced fracture IMPRESSION: Ill-defined airspace opacity at the bilateral lung bases, potentially related to aspiration. Electronically Signed   By: Corrie Mckusick D.O.   On: 11/21/2016 15:17    EKG: Independently reviewed.    Assessment/Plan Principal Problem:   Sepsis Due to suspected aspiration pneumonia -Daughters deny ever noted any symptoms of overt aspiration or dysphagia -Already started on vancomycin and Zosyn  in the emergency room which I will continue today -Start vancomycin in 48 hours, de-escalate antibiotics depending on culture and clinical response -trend lactic acid, patient clinically does not appear as they'll as his lactic acid level would suggest -IVF today -SLP evaluation -Follow blood cultures    Moderate dementia with behavioral disturbance -Continue Depakote and low-dose Xanax when necessary per home regimen    Diabetes mellitus type 2, diet-controlled ([place]) -He is not on any home meds for diabetes -Check hemoglobin A1c and CBGs before meals at bedtime    Chronic systolic CHF (congestive heart failure) ([facility]) -EF 20-25% -Hold torsemide, imdur, hydralazine at this time -Resume slowly once adequately hydrated    CKD (chronic kidney disease), stage III -Creatinine close to baseline, holding diuretics at this time since clinically very high and severe lactic acidosis    Lactic acidosis -Due to sepsis and dehydration -Abdominal exam is benign do not suspect bowel ischemia etc. at this time -We will trend lactic acid with adequate hydration   ETHICs: This is a frail elderly bedbound patient with dementia, chronic systolic CHF, chronic respiratory failure admitted with sepsis/aspiration pneumonia followed by home hospice, patient's daughters want full scope of treatment despite his chronic illnesses, short of resuscitation. -I have discussed with them that we will try to support him with fluids and antibiotics, however if he does not respond to above treatment will need to reassess.  DVT prophylaxis: Lovenox Code Status: DO NOT RESUSCITATE  Family Communication: Daughters at bedside  Disposition Plan: Like to stepdown Consults called: None Admission status: Inpatient  Domenic Polite MD [facility] Pager [phone]  If 7PM-7AM, please contact night-coverage [email or web address] Password [email or web address]  11/21/2016, 5:01 PM

## 2016-11-21 NOTE — Telephone Encounter (Signed)
Patient started vomiting last night.  Having respiratory distress and low oxygen.  [facility].

## 2016-11-22 ENCOUNTER — Inpatient Hospital Stay (HOSPITAL_COMMUNITY)

## 2016-11-22 DIAGNOSIS — E440 Moderate protein-calorie malnutrition: Secondary | ICD-10-CM | POA: Insufficient documentation

## 2016-11-22 LAB — GLUCOSE, CAPILLARY
GLUCOSE-CAPILLARY: 104 mg/dL — AB (ref 65–99)
GLUCOSE-CAPILLARY: 102 mg/dL — AB (ref 65–99)
Glucose-Capillary: 126 mg/dL — ABNORMAL HIGH (ref 65–99)
Glucose-Capillary: 112 mg/dL — ABNORMAL HIGH (ref 65–99)

## 2016-11-22 LAB — URINE CULTURE
Culture: NO GROWTH
Special Requests: NORMAL

## 2016-11-22 LAB — CBC
HEMATOCRIT: 34.4 % — AB (ref 39.0–52.0)
Hemoglobin: 11.6 g/dL — ABNORMAL LOW (ref 13.0–17.0)
MCH: 28.9 pg (ref 26.0–34.0)
MCHC: 33.7 g/dL (ref 30.0–36.0)
MCV: 85.6 fL (ref 78.0–100.0)
PLATELETS: 208 10*3/uL (ref 150–400)
RBC: 4.02 MIL/uL — ABNORMAL LOW (ref 4.22–5.81)
RDW: 15.2 % (ref 11.5–15.5)
WBC: 8.7 10*3/uL (ref 4.0–10.5)

## 2016-11-22 LAB — BASIC METABOLIC PANEL
ANION GAP: 9 (ref 5–15)
BUN: 35 mg/dL — ABNORMAL HIGH (ref 6–20)
CALCIUM: 8.2 mg/dL — AB (ref 8.9–10.3)
CO2: 29 mmol/L (ref 22–32)
Chloride: 101 mmol/L (ref 101–111)
Creatinine, Ser: 1.91 mg/dL — ABNORMAL HIGH (ref 0.61–1.24)
GFR, EST AFRICAN AMERICAN: 34 mL/min — AB (ref >60)
GFR, EST NON AFRICAN AMERICAN: 29 mL/min — AB (ref >60)
Glucose, Bld: 155 mg/dL — ABNORMAL HIGH (ref 65–99)
Potassium: 4.9 mmol/L (ref 3.5–5.1)
Sodium: 139 mmol/L (ref 135–145)

## 2016-11-22 LAB — LACTIC ACID, PLASMA
LACTIC ACID, VENOUS: 2.2 mmol/L — AB (ref 0.5–1.9)
LACTIC ACID, VENOUS: 3.1 mmol/L — AB (ref 0.5–1.9)

## 2016-11-22 LAB — HEMOGLOBIN A1C
HEMOGLOBIN A1C: 4.9 % (ref 4.8–5.6)
MEAN PLASMA GLUCOSE: 94 mg/dL

## 2016-11-22 MED ORDER — LORAZEPAM 2 MG/ML IJ SOLN
0.5000 mg | Freq: Four times a day (QID) | INTRAMUSCULAR | Status: DC | PRN
  Administered 2016-11-24 (×2): 0.5 mg via INTRAVENOUS
  Filled 2016-11-22 (×2): qty 1

## 2016-11-22 MED ORDER — IOPAMIDOL (ISOVUE-300) INJECTION 61%
INTRAVENOUS | Status: AC
  Filled 2016-11-22: qty 30

## 2016-11-22 MED ORDER — METOPROLOL TARTRATE 5 MG/5ML IV SOLN
5.0000 mg | Freq: Four times a day (QID) | INTRAVENOUS | Status: DC
  Administered 2016-11-22: 5 mg via INTRAVENOUS
  Filled 2016-11-22: qty 5

## 2016-11-22 MED ORDER — IOPAMIDOL (ISOVUE-300) INJECTION 61%
15.0000 mL | Freq: Two times a day (BID) | INTRAVENOUS | Status: DC | PRN
Start: 2016-11-22 — End: 2016-11-25

## 2016-11-22 NOTE — Progress Notes (Signed)
PROGRESS NOTE    Russell [name]  OTL:[id number] DOB: August 23, 1923 DOA: 11/21/2016 PCP: Janith Lima, MD  Brief Narrative:Russell [name] is a 83 y.o. male with medical history significant for dementia, bedbound status, chronic systolic CHF EF of 97-41%, chronic respiratory failure on 2 liters of oxygen, chronic kidney disease stage III, diabetes mellitus, prostate cancer who is followed by hospice at home was brought to the emergency room by his daughter is due to increased shortness of breath, cough, nausea and vomiting since yesterday. They deny any fevers or chills however patient was noted to be febrile in the emergency room. ED Course: Noted to be febrile, hypoxic requiring 4 L of oxygen stable blood pressure, lactic acid was significantly elevated at 7.9, Abd exam benignchest x-ray notable for ill-defined opacities at both lung bases concerning for pneumonia  Assessment & Plan:   Principal Problem:   Sepsis due to suspected aspiration pneumonia ([place]) - with now episode of vomiting last night with ? feculant appearing material, concern for bowel obstn/ischemia, complications related to hernia -will check CT abd pelvis without IV contrast -continue IVF today, broad spectrum Abx -FU Blood CX -Stop Vanc tomorrow if Cx stay negative -awaiting SLP eval too -lactate improving will repeat    Moderate dementia with behavioral disturbance -Continue Depakote and low-dose Xanax when necessary per home regimen    Diabetes mellitus type 2, diet-controlled ([place]) -He is not on any home meds for diabetes -CBGs stable, FU Hba1c    Chronic systolic CHF (congestive heart failure) ([facility]) -EF 20-25% -Hold torsemide, imdur, hydralazine at this time -continue IVF now    CKD (chronic kidney disease), stage III -Creatinine close to baseline, holding diuretics   ETHICs: This is a frail elderly bedbound patient with dementia, chronic systolic CHF, chronic respiratory failure admitted with  sepsis/aspiration pneumonia followed by home hospice, patient's daughters want full scope of treatment despite his chronic illnesses, short of resuscitation. -I have discussed with them that we will try to support him with fluids and antibiotics, however if he does not respond to above treatment will need to readdress goals of Rx  DVT prophylaxis: Lovenox Code Status: DO NOT RESUSCITATE  Family Communication: None at bedside now, d/w Daughters yesterday on admission Disposition Plan: Keep in SDU    Antimicrobials:   Vanc/Zosyn   Subjective: Pt denies any complaints, RN reports vomiting with ? feculant material last pm Objective: Vitals:   11/22/16 0745 11/22/16 0800 11/22/16 0900 11/22/16 1000  BP:  (!) 160/50 (!) 132/56 (!) 156/49  Pulse:  64 71 61  Resp:  11 15 12   Temp: 97.6 F (36.4 C)     TempSrc: Oral     SpO2:  100% 99% 93%  Weight:      Height:        Intake/Output Summary (Last 24 hours) at 11/22/16 1120 Last data filed at 11/22/16 0410  Gross per 24 hour  Intake             1150 ml  Output              200 ml  Net              950 ml   Filed Weights   11/21/16 1406 11/21/16 1832 11/22/16 0410  Weight: 68 kg (150 lb) 59.1 kg (130 lb 4.7 oz) 60.2 kg (132 lb 11.5 oz)    Examination:  General exam: elderly frail male, resting comfortably Respiratory system: decreased BS at bases Cardiovascular system: S1 &  S2 heard, RRR. No JVD, murmurs Gastrointestinal system: Abdomen is soft, ventral hernia noted, mild peri umb tenderness, no rigidity or rebound, BS present. Central nervous system: Alert and oriented to self and partly to place only Extremities: Symmetric 5 x 5 power. Skin: No rashes, lesions or ulcers Psychiatry: flat affect    Data Reviewed:   CBC:  Recent Labs Lab 11/21/16 1352 11/22/16 0344  WBC 2.4* 8.7  HGB 14.2 11.6*  HCT 42.0 34.4*  MCV 88.2 85.6  PLT 243 854   Basic Metabolic Panel:  Recent Labs Lab 11/21/16 1352  11/22/16 0344  NA 141 139  K 4.6 4.9  CL 96* 101  CO2 29 29  GLUCOSE 166* 155*  BUN 26* 35*  CREATININE 1.98* 1.91*  CALCIUM 9.0 8.2*   GFR: Estimated Creatinine Clearance: 21.4 mL/min (A) (by C-G formula based on SCr of 1.91 mg/dL (H)). Liver Function Tests:  Recent Labs Lab 11/21/16 1352  AST 37  ALT 12*  ALKPHOS 103  BILITOT 0.7  PROT 7.1  ALBUMIN 3.2*    Recent Labs Lab 11/21/16 1352  LIPASE 19   No results for input(s): AMMONIA in the last 168 hours. Coagulation Profile: No results for input(s): INR, PROTIME in the last 168 hours. Cardiac Enzymes: No results for input(s): CKTOTAL, CKMB, CKMBINDEX, TROPONINI in the last 168 hours. BNP (last 3 results) No results for input(s): PROBNP in the last 8760 hours. HbA1C:  Recent Labs  11/21/16 1352  HGBA1C 4.9   CBG:  Recent Labs Lab 11/21/16 2135 11/22/16 0728  GLUCAP 139* 126*   Lipid Profile: No results for input(s): CHOL, HDL, LDLCALC, TRIG, CHOLHDL, LDLDIRECT in the last 72 hours. Thyroid Function Tests: No results for input(s): TSH, T4TOTAL, FREET4, T3FREE, THYROIDAB in the last 72 hours. Anemia Panel: No results for input(s): VITAMINB12, FOLATE, FERRITIN, TIBC, IRON, RETICCTPCT in the last 72 hours. Urine analysis:    Component Value Date/Time   COLORURINE AMBER (A) 11/21/2016 1427   APPEARANCEUR CLOUDY (A) 11/21/2016 1427   LABSPEC 1.018 11/21/2016 1427   PHURINE 5.0 11/21/2016 1427   GLUCOSEU NEGATIVE 11/21/2016 1427   GLUCOSEU NEGATIVE 12/13/2015 1559   HGBUR NEGATIVE 11/21/2016 1427   HGBUR large 08/03/2008 1258   BILIRUBINUR SMALL (A) 11/21/2016 1427   BILIRUBINUR neg 04/09/2014 1056   KETONESUR 5 (A) 11/21/2016 1427   PROTEINUR 30 (A) 11/21/2016 1427   UROBILINOGEN 0.2 12/13/2015 1559   NITRITE NEGATIVE 11/21/2016 1427   LEUKOCYTESUR SMALL (A) 11/21/2016 1427   Sepsis Labs: @LABRCNTIP (procalcitonin:4,lacticidven:4)  ) Recent Results (from the past 240 hour(s))  MRSA PCR  Screening     Status: None   Collection Time: 11/21/16  6:36 PM  Result Value Ref Range Status   MRSA by PCR NEGATIVE NEGATIVE Final    Comment:        The GeneXpert MRSA Assay (FDA approved for NASAL specimens only), is one component of a comprehensive MRSA colonization surveillance program. It is not intended to diagnose MRSA infection nor to guide or monitor treatment for MRSA infections.          Radiology Studies: Dg Chest 2 View  Result Date: 11/21/2016 CLINICAL DATA:  83 year old male with a history of congestive heart failure. Possible aspiration. EXAM: CHEST  2 VIEW COMPARISON:  10/14/2016, 08/01/2016, 01/12/2016 FINDINGS: Cardiomediastinal silhouette unchanged. Calcifications of the aortic arch. No pneumothorax. Anterior view demonstrates airspace opacity at the medial right base, retrocardiac opacity, and ill-defined opacity at the posterior lung base on the lateral view.  No displaced fracture IMPRESSION: Ill-defined airspace opacity at the bilateral lung bases, potentially related to aspiration. Electronically Signed   By: Corrie Mckusick D.O.   On: 11/21/2016 15:17        Scheduled Meds: . iopamidol      . aspirin EC  81 mg Oral Daily  . bisacodyl  10 mg Rectal Daily  . carvedilol  3.125 mg Oral BID WC  . divalproex  250 mg Oral Daily  . enoxaparin (LOVENOX) injection  30 mg Subcutaneous Q24H  . polyethylene glycol  17 g Oral Daily  . sertraline  100 mg Oral Daily   Continuous Infusions: . sodium chloride 100 mL/hr at 11/21/16 1830  . piperacillin-tazobactam (ZOSYN)  IV 3.375 g (11/22/16 0858)  . [START ON 11/23/2016] vancomycin       LOS: 1 day    Time spent: 67min    Domenic Polite, MD [facility] Pager [phone]  If 7PM-7AM, please contact night-coverage [email or web address] Password [email or web address] 11/22/2016, 11:20 AM

## 2016-11-22 NOTE — Progress Notes (Signed)
Addendum: -Ct abd shows high grade SBO due to adhesions or Ventral hernia -Pt is NPO, on IVF and Zosyn -will place NGT to ILWS -Would be a poor surgical candidate on account of Dementia, debility, bed bound status, Chronic systolic CHF, CKD3, malnutrition etc, I called his daughter / one of his PoA's Drema Balzarine and told her that, and recommended comfort focused care if he fails conservative management, [name] understands situation but wasn't able to decide anything without talking to her siblings -also discussed case with Surgeon Dr.Rosenbauer who will consult  Domenic Polite, MD

## 2016-11-22 NOTE — Progress Notes (Signed)
[id number] - [facility]-GIP RN Visit.   This is a related and covered GIP admission of 11/21/16 with [facility] diagnosis CHF  per Dr. Karie Georges. Patient has DNR. Caregiver activated EMS after patient had several episodes of vomiting. Hospice RN Barb Merino was notified. Admitted 11/21/16. Dr. Scarlette Calico was notified of admission.   Patient is alert and responsive, confused to situation. Patient denies pain. Foley catheter draining amber urine.   CT of abdomen and Pelvis ordered.  ? Medications: Zosyn IVPB 3.375g TID, Vancomycin IVPB 1000mg  /270ml q48hrs. Tylenol 650mg  PO or suppository  Q6hrs PRN, Xanax 0.5mg  PRN anxiety TID. Zofran 4mg  Q6hrs PRN,   Will continue to follow while hospitalized and anticipate discharge needs.   Thank you  [place] [facility] Liaison  [phone]   All Hospital Liaisons are on AMION

## 2016-11-22 NOTE — Consult Note (Addendum)
   [facility] CM Inpatient Consult   11/22/2016  Ignace Mandigo 03-01-1924 [id number]    Mr. Missey screened for potential [facility] Care Management program services.   Chart reviewed. Noted he is active with Hospice and [place].  Therefore, there are no identifiable [facility] Care Management needs.  Marthenia Rolling, MSN-Ed, RN,BSN [facility] Liaison [phone]

## 2016-11-22 NOTE — Care Management Note (Signed)
Case Management Note  Patient Details  Name: Russell [name] MRN: [id number] Date of Birth: 03-Oct-1923  Subjective/Objective:      pna bilateral/4l 02/increased wob              Action/Plan: fyi-patient using the services of hospice Date:  November 22, 2016 Chart reviewed for concurrent status and case management needs. Will continue to follow patient progress. Discharge Planning: following for needs Expected discharge date: [id number] Velva Harman, BSN, [place], [place] Expected Discharge Date:   (unknown)               Expected Discharge Plan:  Home w Hospice Care  In-House Referral:     Discharge planning Services  CM Consult  Post Acute Care Choice:    Choice offered to:     DME Arranged:    DME Agency:     HH Arranged:    [place] Agency:     Status of Service:  In process, will continue to follow  If discussed at Long Length of Stay Meetings, dates discussed:    Additional Comments:  Leeroy Cha, RN 11/22/2016, 8:49 AM

## 2016-11-22 NOTE — Consult Note (Signed)
Reason for Consult: Small bowel obstructioni Referring Physician: Dr. Alphonzo Lemmings is an 83 y.o. male.  HPI: This is a 83 year old male with significant congestive heart failure and dementia who was admitted with small bowel obstruction. He's had abdominal surgery for colon cancer in the past. He presented emergency department yesterday, brought by his daughter, because he is having vomiting along with cough and increasing shortness of breath.  There is a concern on chest x-ray that he might have a pneumonia. CT scan of the abdomen and pelvis  was subsequently performed because of abdominal distention and vomiting. This demonstrated what was felt to be high-grade small bowel obstruction. Asked to see him because of this. I spoke with his family members who were in the room with him. The entire family has decided that they do not want him to have any surgery. They described the emesis as feculent.  Past Medical History:  Diagnosis Date  . Anxiety associated with depression   . Chronic systolic CHF (congestive heart failure) ([place])    a. Echo 8/17: EF 20%, diff HK, ant-septal, ant-lateral inf severe HK, prominent trabeculation noted in LV apex, mod AI, trivial MR, trivial TR, small pericardial effusion // b. Limited Echo 8/17: Diff HK, inf and post AK, apical AK, mild LVH, EF 20-25%, mild AI, mild MR, mild LAE, mod to severe reduced RVSF, small pericardial effusion  . Colon cancer [facility])    s/p resection 12/06  . Dementia   . Diabetes mellitus    diet mgt (11`/12)  . FTT (failure to thrive)    admited 08/2008  . History of cystoscopy    uretheral stricutre, 2005- Dr.Humphreys  . Hyperlipidemia   . Hypertension   . Loss of hearing    being fitted for hearing aids (11/12)  . Osteoarthritis    hands  . Pneumonia   . Prostate cancer ([place])    s/p XRT  . SBO (small bowel obstruction) ([place]) 03/08/2014  . Urolithiasis     Past Surgical History:  Procedure Laterality Date  . CATARACT  EXTRACTION, BILATERAL    . ESOPHAGOGASTRODUODENOSCOPY (EGD) WITH PROPOFOL N/A 08/03/2016   Procedure: ESOPHAGOGASTRODUODENOSCOPY (EGD) WITH PROPOFOL;  Surgeon: Wonda Horner, MD;  Location: [facility] ENDOSCOPY;  Service: Endoscopy;  Laterality: N/A;  . HEMICOLECTOMY  12/06  . TRANSURETHRAL RESECTION OF PROSTATE    . xrt     ERT for prostate ca    Family History  Problem Relation Age of Onset  . Diabetes Daughter   . Cancer Neg Hx   . Early death Neg Hx   . Heart disease Neg Hx   . Hyperlipidemia Neg Hx   . Hypertension Neg Hx   . Kidney disease Neg Hx   . Stroke Neg Hx   . Heart attack Neg Hx     Social History:  reports that he quit smoking about 49 years ago. He has never used smokeless tobacco. He reports that he does not drink alcohol or use drugs.  Allergies: No Known Allergies  Prior to Admission medications   Medication Sig Start Date End Date Taking? Authorizing Provider  ALPRAZolam (XANAX) 0.5 MG tablet TAKE 1 TABLET BY MOUTH THREE TIMES DAILY AS NEEDED FOR ANXIETY Patient taking differently: Take 1 tablet by mouth twice daily 09/26/16  Yes Janith Lima, MD  aspirin EC 81 MG tablet Take 81 mg by mouth every evening.   Yes [provider]  carvedilol (COREG) 3.125 MG tablet TAKE 1 TABLET(3.125 MG) BY  MOUTH TWICE DAILY WITH A MEAL 04/17/16  Yes Janith Lima, MD  divalproex (DEPAKOTE ER) 250 MG 24 hr tablet Take 1 tablet (250 mg total) by mouth daily. 02/29/16  Yes Cameron Sprang, MD  DULCOLAX 10 MG suppository Place 10 mg rectally daily as needed for moderate constipation.    Yes [provider]  fesoterodine (TOVIAZ) 8 MG TB24 tablet Take 1 tablet (8 mg total) by mouth daily. 05/13/14  Yes Janith Lima, MD  haloperidol (HALDOL) 2 MG tablet Take 2 mg by mouth every 4 (four) hours as needed (for restlessness).   Yes [provider]  HYDROcodone-acetaminophen (NORCO/VICODIN) 5-325 MG tablet Take 1 tablet by mouth every 6 (six) hours as needed for  moderate pain. 10/29/16  Yes Janith Lima, MD  HYDROcodone-homatropine [facility]) 5-1.5 MG/5ML syrup Take 5 mLs by mouth every 6 (six) hours as needed for cough.   Yes [provider]  potassium chloride SA (K-DUR,KLOR-CON) 20 MEQ tablet Take 1 tablet (20 mEq total) by mouth daily. 03/27/16  Yes Janith Lima, MD  prochlorperazine (COMPAZINE) 10 MG tablet Take 1 tablet (10 mg total) by mouth every 4 (four) hours. Patient taking differently: Take 10 mg by mouth every 4 (four) hours as needed for nausea or vomiting.  09/03/16  Yes Janith Lima, MD  promethazine-dextromethorphan (PROMETHAZINE-DM) 6.25-15 MG/5ML syrup Take 5 mLs by mouth every 6 (six) hours as needed for cough.  08/17/16  Yes [provider]  senna-docusate (SENOKOT-S) 8.6-50 MG tablet Take 3 tablets by mouth 2 (two) times daily.   Yes [provider]  sertraline (ZOLOFT) 100 MG tablet TAKE 1 TABLET BY MOUTH DAILY 04/01/16  Yes Janith Lima, MD  torsemide (DEMADEX) 20 MG tablet Take 1 tablet (20 mg total) by mouth daily. 10/19/16  Yes Dhungel, Nishant, MD  vitamin B-12 (CYANOCOBALAMIN) 1000 MCG tablet Take 1,000 mcg by mouth daily.   Yes [provider]     Results for orders placed or performed during the hospital encounter of 11/21/16 (from the past 48 hour(s))  Lipase, blood     Status: None   Collection Time: 11/21/16  1:52 PM  Result Value Ref Range   Lipase 19 11 - 51 U/L  Comprehensive metabolic panel     Status: Abnormal   Collection Time: 11/21/16  1:52 PM  Result Value Ref Range   Sodium 141 135 - 145 mmol/L   Potassium 4.6 3.5 - 5.1 mmol/L   Chloride 96 (L) 101 - 111 mmol/L   CO2 29 22 - 32 mmol/L   Glucose, Bld 166 (H) 65 - 99 mg/dL   BUN 26 (H) 6 - 20 mg/dL   Creatinine, Ser 1.98 (H) 0.61 - 1.24 mg/dL   Calcium 9.0 8.9 - 10.3 mg/dL   Total Protein 7.1 6.5 - 8.1 g/dL   Albumin 3.2 (L) 3.5 - 5.0 g/dL   AST 37 15 - 41 U/L   ALT 12 (L) 17 - 63 U/L   Alkaline Phosphatase 103  38 - 126 U/L   Total Bilirubin 0.7 0.3 - 1.2 mg/dL   GFR calc non Af Amer 28 (L) >60 mL/min   GFR calc Af Amer 32 (L) >60 mL/min    Comment: (NOTE) The eGFR has been calculated using the CKD EPI equation. This calculation has not been validated in all clinical situations. eGFR's persistently <60 mL/min signify possible Chronic Kidney Disease.    Anion gap 16 (H) 5 - 15  CBC  Status: Abnormal   Collection Time: 11/21/16  1:52 PM  Result Value Ref Range   WBC 2.4 (L) 4.0 - 10.5 K/uL   RBC 4.76 4.22 - 5.81 MIL/uL   Hemoglobin 14.2 13.0 - 17.0 g/dL   HCT 42.0 39.0 - 52.0 %   MCV 88.2 78.0 - 100.0 fL   MCH 29.8 26.0 - 34.0 pg   MCHC 33.8 30.0 - 36.0 g/dL   RDW 15.1 11.5 - 15.5 %   Platelets 243 150 - 400 K/uL  Hemoglobin A1c     Status: None   Collection Time: 11/21/16  1:52 PM  Result Value Ref Range   Hgb A1c MFr Bld 4.9 4.8 - 5.6 %    Comment: (NOTE)         Pre-diabetes: 5.7 - 6.4         Diabetes: >6.4         Glycemic control for adults with diabetes: <7.0    Mean Plasma Glucose 94 mg/dL    Comment: (NOTE) Performed At: [facility] [place], [place] [id number] Lindon Romp MD NW:[id number]   I-Stat CG4 Lactic Acid, ED     Status: Abnormal   Collection Time: 11/21/16  2:06 PM  Result Value Ref Range   Lactic Acid, Venous 7.97 (HH) 0.5 - 1.9 mmol/L   Comment NOTIFIED PHYSICIAN   Urinalysis, Routine w reflex microscopic     Status: Abnormal   Collection Time: 11/21/16  2:27 PM  Result Value Ref Range   Color, Urine AMBER (A) YELLOW    Comment: BIOCHEMICALS MAY BE AFFECTED BY COLOR   APPearance CLOUDY (A) CLEAR   Specific Gravity, Urine 1.018 1.005 - 1.030   pH 5.0 5.0 - 8.0   Glucose, UA NEGATIVE NEGATIVE mg/dL   Hgb urine dipstick NEGATIVE NEGATIVE   Bilirubin Urine SMALL (A) NEGATIVE   Ketones, ur 5 (A) NEGATIVE mg/dL   Protein, ur 30 (A) NEGATIVE mg/dL   Nitrite NEGATIVE NEGATIVE   Leukocytes, UA SMALL (A) NEGATIVE   RBC / HPF  0-5 0 - 5 RBC/hpf   WBC, UA 0-5 0 - 5 WBC/hpf   Bacteria, UA FEW (A) NONE SEEN   Squamous Epithelial / LPF 0-5 (A) NONE SEEN   Mucous PRESENT    Hyaline Casts, UA PRESENT   Urine culture     Status: None   Collection Time: 11/21/16  2:27 PM  Result Value Ref Range   Specimen Description URINE, CATHETERIZED    Special Requests Normal    Culture      NO GROWTH Performed at [facility], [address] [place], [place] [address]    Report Status 11/22/2016 FINAL   Blood Culture (routine x 2)     Status: None (Preliminary result)   Collection Time: 11/21/16  3:28 PM  Result Value Ref Range   Specimen Description BLOOD LEFT WRIST    Special Requests IN PEDIATRIC BOTTLE Blood Culture adequate volume    Culture      NO GROWTH < 24 HOURS Performed at [facility], [place] [address] [place], [place] [address]    Report Status PENDING   Blood Culture (routine x 2)     Status: None (Preliminary result)   Collection Time: 11/21/16  3:39 PM  Result Value Ref Range   Specimen Description BLOOD LEFT WRIST    Special Requests IN PEDIATRIC BOTTLE Blood Culture adequate volume    Culture      NO  GROWTH < 24 HOURS Performed at [facility], [place] [address], [place], [place] [address]    Report Status PENDING   I-Stat CG4 Lactic Acid, ED     Status: Abnormal   Collection Time: 11/21/16  5:17 PM  Result Value Ref Range   Lactic Acid, Venous 6.34 (HH) 0.5 - 1.9 mmol/L   Comment NOTIFIED PHYSICIAN   MRSA PCR Screening     Status: None   Collection Time: 11/21/16  6:36 PM  Result Value Ref Range   MRSA by PCR NEGATIVE NEGATIVE    Comment:        The GeneXpert MRSA Assay (FDA approved for NASAL specimens only), is one component of a comprehensive MRSA colonization surveillance program. It is not intended to diagnose MRSA infection nor to guide or monitor treatment for MRSA infections.   Lactic acid, plasma     Status: Abnormal   Collection Time: 11/21/16  8:42 PM   Result Value Ref Range   Lactic Acid, Venous 4.1 (HH) 0.5 - 1.9 mmol/L    Comment: CRITICAL RESULT CALLED TO, READ BACK BY AND VERIFIED WITH: IRFA HABIB,RN [id number] @ 2133 BY J SCOTTON   Glucose, capillary     Status: Abnormal   Collection Time: 11/21/16  9:35 PM  Result Value Ref Range   Glucose-Capillary 139 (H) 65 - 99 mg/dL   Comment 1 Notify RN   Lactic acid, plasma     Status: Abnormal   Collection Time: 11/21/16 11:05 PM  Result Value Ref Range   Lactic Acid, Venous 3.8 (HH) 0.5 - 1.9 mmol/L    Comment: CRITICAL RESULT CALLED TO, READ BACK BY AND VERIFIED WITH: IRFA HABIB,RN [id number] @ 2376 BY J SCOTTON   CBC     Status: Abnormal   Collection Time: 11/22/16  3:44 AM  Result Value Ref Range   WBC 8.7 4.0 - 10.5 K/uL   RBC 4.02 (L) 4.22 - 5.81 MIL/uL   Hemoglobin 11.6 (L) 13.0 - 17.0 g/dL   HCT 34.4 (L) 39.0 - 52.0 %   MCV 85.6 78.0 - 100.0 fL   MCH 28.9 26.0 - 34.0 pg   MCHC 33.7 30.0 - 36.0 g/dL   RDW 15.2 11.5 - 15.5 %   Platelets 208 150 - 400 K/uL  Basic metabolic panel     Status: Abnormal   Collection Time: 11/22/16  3:44 AM  Result Value Ref Range   Sodium 139 135 - 145 mmol/L   Potassium 4.9 3.5 - 5.1 mmol/L   Chloride 101 101 - 111 mmol/L   CO2 29 22 - 32 mmol/L   Glucose, Bld 155 (H) 65 - 99 mg/dL   BUN 35 (H) 6 - 20 mg/dL   Creatinine, Ser 1.91 (H) 0.61 - 1.24 mg/dL   Calcium 8.2 (L) 8.9 - 10.3 mg/dL   GFR calc non Af Amer 29 (L) >60 mL/min   GFR calc Af Amer 34 (L) >60 mL/min    Comment: (NOTE) The eGFR has been calculated using the CKD EPI equation. This calculation has not been validated in all clinical situations. eGFR's persistently <60 mL/min signify possible Chronic Kidney Disease.    Anion gap 9 5 - 15  Glucose, capillary     Status: Abnormal   Collection Time: 11/22/16  7:28 AM  Result Value Ref Range   Glucose-Capillary 126 (H) 65 - 99 mg/dL  Glucose, capillary     Status: Abnormal   Collection Time: 11/22/16 11:54 AM  Result Value  Ref  Range   Glucose-Capillary 104 (H) 65 - 99 mg/dL  Lactic acid, plasma     Status: Abnormal   Collection Time: 11/22/16  2:00 PM  Result Value Ref Range   Lactic Acid, Venous 2.2 (HH) 0.5 - 1.9 mmol/L    Comment: CRITICAL RESULT CALLED TO, READ BACK BY AND VERIFIED WITH: G.EPLING AT 1448 ON 11/22/16 BY N.THOMPSON   Glucose, capillary     Status: Abnormal   Collection Time: 11/22/16  4:20 PM  Result Value Ref Range   Glucose-Capillary 112 (H) 65 - 99 mg/dL    Ct Abdomen Pelvis Wo Contrast  Result Date: 11/22/2016 CLINICAL DATA:  83 year old male with a history of nausea and vomiting EXAM: CT ABDOMEN AND PELVIS WITHOUT CONTRAST TECHNIQUE: Multidetector CT imaging of the abdomen and pelvis was performed following the standard protocol without IV contrast. COMPARISON:  03/10/2014 FINDINGS: Lower chest: Centrilobular nodularity of the right lower lobe with more nodular confluent dependent changes. Trace right-sided pleural effusion. Confluent opacity of the left lung base with volume loss and crowding of the airways. Trace left-sided pleural effusion. Pericardial fluid/ thickening.  Native coronary calcifications. Hepatobiliary: Unremarkable appearance of liver. Unremarkable gallbladder. Pancreas: Atrophic pancreas parenchyma. Spleen: Unremarkable spleen Adrenals/Urinary Tract: Unremarkable appearance of the adrenal glands. Nonobstructive right-sided nephrolithiasis with stone in the pelvis measuring 11 mm. Unremarkable course of the right ureter. Unremarkable appearance of left kidney without hydronephrosis. Unremarkable left ureter. Unremarkable urinary bladder. Stomach/Bowel: Distention of stomach with retained enteric contrast. Small hiatal hernia. Dilated jejunum and ileum with multiple air-fluid levels. There is a short segment of small bowel entering a ventral hernia above the umbilicus. The primary transition site of small bowel is favored to be within the central small bowel just anterior to the  aorta on axial images, though best seen on sagittal reformatted images (image 61 of series 5.) beyond this small bowel and colon are decompressed. Colonic diverticular disease. Surgical changes of prior right partial colectomy Vascular/Lymphatic: Vascular calcifications of the abdominal aorta at the origin of the mesenteric vessels and renal arteries. No aneurysm. Calcifications of the bilateral iliac vasculature. Reproductive: Calcifications of the prostate. Other: Edema of the body wall. Fluid containing right inguinal hernia. Musculoskeletal: Osteopenia. Flowing anterior osteophytes of the thoracolumbar spine. No acute fracture. No bony canal narrowing. Degenerative changes of degenerative disc disease and facet disease of the lumbar region. Degenerative changes at the bilateral hips. IMPRESSION: High-grade small bowel obstruction with the primary transition point in the mid abdomen. This is favored to be secondary to either adhesions, or a tethered small bowel loop, potentially related to the ventral hernia. Although there is a short segment of small bowel entering the ventral hernia above the umbilicus, this does not appear to be the primary transition point. Surgical consultation recommended. These results were called by telephone at the time of interpretation on 11/22/2016 at 3:55 pm to the nurse caring for the patient, Ms Arlyss Gandy, who verbally acknowledged these results. Surgical changes of prior partial right colectomy. Body wall edema/anasarca. Trace pericardial fluid/thickening. Centrilobular nodularity of the bilateral lower lobes with volume loss. Changes may reflect aspiration pneumonitis/pneumonia. Small associated pleural effusions. Aortic Atherosclerosis (ICD10-I70.0). Associated coronary artery disease. Nonobstructive right-sided nephrolithiasis. Electronically Signed   By: Corrie Mckusick D.O.   On: 11/22/2016 15:57   Dg Chest 2 View  Result Date: 11/21/2016 CLINICAL DATA:  83 year old male  with a history of congestive heart failure. Possible aspiration. EXAM: CHEST  2 VIEW COMPARISON:  10/14/2016, 08/01/2016, 01/12/2016  FINDINGS: Cardiomediastinal silhouette unchanged. Calcifications of the aortic arch. No pneumothorax. Anterior view demonstrates airspace opacity at the medial right base, retrocardiac opacity, and ill-defined opacity at the posterior lung base on the lateral view. No displaced fracture IMPRESSION: Ill-defined airspace opacity at the bilateral lung bases, potentially related to aspiration. Electronically Signed   By: Corrie Mckusick D.O.   On: 11/21/2016 15:17    Review of Systems  Unable to perform ROS: Dementia   Blood pressure (!) 127/44, pulse 69, temperature 97.6 F (36.4 C), temperature source Axillary, resp. rate 15, height 5' 6"  (1.676 m), weight 60.2 kg (132 lb 11.5 oz), SpO2 99 %. Physical Exam  Constitutional: No distress.  Cachetic appearing elderly male.  Neck:  Muscular wasting noted.  GI:  Firm and distended with a right mid abdominal scar. There is a reducible epigastric abdominal wall hernia. Minimal tenderness present. Hypoactive bowel sounds are present.  Neurological:  He is awake. Answers some questions inappropriately and others not.   Skin: Skin is warm and dry.    Assessment/Plan: High-grade small bowel obstruction. Has a history of this in the past that has resolved with medical management per his family.  They do not want him to have an operation.  Rec:  Insert NG tube. Attempt medical management. If this fails, would recommend comfort care.  Nollan Muldrow J 11/22/2016, 4:54 PM

## 2016-11-22 NOTE — Progress Notes (Signed)
Initial Nutrition Assessment  DOCUMENTATION CODES:   Non-severe (moderate) malnutrition in context of chronic illness  INTERVENTION:  - Diet advancement as medically feasible. - RD will monitor for needs at follow-up.   NUTRITION DIAGNOSIS:   Malnutrition (moderate/non-severe) related to chronic illness (dementia) as evidenced by severe depletion of muscle mass, moderate depletion of body fat.  GOAL:   Patient will meet greater than or equal to 90% of their needs  MONITOR:   Diet advancement, Weight trends, Labs  REASON FOR ASSESSMENT:   Low Braden  ASSESSMENT:   83 y.o. male with medical history significant for dementia, bedbound status, chronic systolic CHF EF of 57-84%, chronic respiratory failure on 2 liters of oxygen, chronic kidney disease stage III, diabetes mellitus, prostate cancer who is followed by hospice at home was brought to the emergency room by his daughter is due to increased shortness of breath, cough, nausea and vomiting since yesterday.  Pt seen for low Braden. BMI indicates normal weight. Pt was on Dysphagia 3, thin liquid diet from yesterday at 1830 and changed to NPO today shortly before 0800. No intakes documented while diet was ordered. Pt with hx of dementia and was unable to provide any information. No family/visitors present. RN at bedside and states she has not seen any family today. Pt to have CT abd with contrast but RN reports difficulty in giving contrast d/t coughing with intakes, slow drinking.   Physical assessment shows severe muscle wasting to shoulder and clavicle areas, mild/moderate muscle wasting to lower leg and temple; mild/moderate fat wasting to upper arm. No edema noted at this time. Per chart review, pt has lost 2 lbs (1.5% body weight) in the past 3 months; this is not significant for time frame.   Medications reviewed; 10 mg rectal Dulcolax/day, 1 packet Miralax/day. Labs reviewed; CBG: 126 mg/dL this AM, BUN: 35 mg/dL, creatinine:  1.91 mg/dL, Ca: 8.2 mg/dL, GFR: 34 mL/min.   IVF: NS @ 100 mL/hr.    Diet Order:  Diet NPO time specified  Skin:  Reviewed, no issues  Last BM:  PTA/unknown  Height:   Ht Readings from Last 1 Encounters:  11/21/16 5\' 6"  (1.676 m)    Weight:   Wt Readings from Last 1 Encounters:  11/22/16 132 lb 11.5 oz (60.2 kg)    Ideal Body Weight:  64.54 kg  BMI:  Body mass index is 21.42 kg/m.  Estimated Nutritional Needs:   Kcal:  1385-1625 (23-27 kcal/kg)  Protein:  60-70 grams  Fluid:  >/= 1.6 L/day  EDUCATION NEEDS:   No education needs identified at this time    Russell Matin, MS, RD, LDN, CNSC Inpatient Clinical Dietitian Pager # [phone] After hours/weekend pager # [phone]

## 2016-11-22 NOTE — Evaluation (Signed)
SLP Cancellation Note  Patient Details Name: Russell [name] MRN: [id number] DOB: Sep 10, 1923   Cancelled treatment:       Reason Eval/Treat Not Completed: Medical issues which prohibited therapy (pt awaiting CT abdomen, per chart review, pt was vomiting possibly fecal matter, will continue efforts)  RN (Ginger) reports pt tolerating po contrast today for CT abdomen  Luanna Salk, [facility] SLP [phone]  Macario Golds 11/22/2016, 12:17 PM

## 2016-11-23 LAB — GLUCOSE, CAPILLARY
GLUCOSE-CAPILLARY: 73 mg/dL (ref 65–99)
Glucose-Capillary: 83 mg/dL (ref 65–99)
Glucose-Capillary: 96 mg/dL (ref 65–99)
Glucose-Capillary: 111 mg/dL — ABNORMAL HIGH (ref 65–99)
Glucose-Capillary: 131 mg/dL — ABNORMAL HIGH (ref 65–99)

## 2016-11-23 LAB — BASIC METABOLIC PANEL
Anion gap: 10 (ref 5–15)
BUN: 47 mg/dL — ABNORMAL HIGH (ref 6–20)
CALCIUM: 8.1 mg/dL — AB (ref 8.9–10.3)
CO2: 28 mmol/L (ref 22–32)
CREATININE: 1.71 mg/dL — AB (ref 0.61–1.24)
Chloride: 103 mmol/L (ref 101–111)
GFR, EST AFRICAN AMERICAN: 39 mL/min — AB (ref >60)
GFR, EST NON AFRICAN AMERICAN: 33 mL/min — AB (ref >60)
Glucose, Bld: 90 mg/dL (ref 65–99)
Potassium: 4.4 mmol/L (ref 3.5–5.1)
SODIUM: 141 mmol/L (ref 135–145)

## 2016-11-23 LAB — CBC
HCT: 31.6 % — ABNORMAL LOW (ref 39.0–52.0)
Hemoglobin: 10.7 g/dL — ABNORMAL LOW (ref 13.0–17.0)
MCH: 28.5 pg (ref 26.0–34.0)
MCHC: 33.9 g/dL (ref 30.0–36.0)
MCV: 84.3 fL (ref 78.0–100.0)
PLATELETS: 170 10*3/uL (ref 150–400)
RBC: 3.75 MIL/uL — ABNORMAL LOW (ref 4.22–5.81)
RDW: 15.1 % (ref 11.5–15.5)
WBC: 11.2 10*3/uL — AB (ref 4.0–10.5)

## 2016-11-23 MED ORDER — METOPROLOL TARTRATE 5 MG/5ML IV SOLN
2.5000 mg | Freq: Four times a day (QID) | INTRAVENOUS | Status: DC
  Administered 2016-11-23 – 2016-11-25 (×5): 2.5 mg via INTRAVENOUS
  Filled 2016-11-23 (×5): qty 5

## 2016-11-23 MED ORDER — DEXTROSE-NACL 5-0.9 % IV SOLN
INTRAVENOUS | Status: DC
  Administered 2016-11-23: 75 mL/h via INTRAVENOUS
  Administered 2016-11-25: 01:00:00 via INTRAVENOUS

## 2016-11-23 MED ORDER — HYDRALAZINE HCL 20 MG/ML IJ SOLN
10.0000 mg | Freq: Four times a day (QID) | INTRAMUSCULAR | Status: DC
  Administered 2016-11-23 – 2016-11-24 (×4): 10 mg via INTRAVENOUS
  Filled 2016-11-23 (×8): qty 0.5
  Filled 2016-11-23: qty 1

## 2016-11-23 NOTE — Progress Notes (Signed)
Pts cbg 73.  Dr. Virgil Benedict notified.  Fluidds changed.  Follow up CBG 83.  Will monitor closely.

## 2016-11-23 NOTE — Progress Notes (Signed)
Pharmacy Antibiotic Note  Russell [name] is a 83 y.o. male admitted on 11/21/2016 with possible aspiration and  sepsis.  Pharmacy has been consulted for vancomycin and zosyn dosing. Vanc d/c'd 7/27 due to negative MRSA PCR  Plan:  Continue current Zosyn dosing for aspiration PNA but recommend changing Zosyn to Unasyn to narrow coverage better for aspiration PNA and possible abdominal infection  Height: 5\' 6"  (167.6 cm) Weight: 134 lb 14.7 oz (61.2 kg) IBW/kg (Calculated) : 63.8  Temp (24hrs), Avg:97.8 F (36.6 C), Min:97.3 F (36.3 C), Max:98.4 F (36.9 C)   Recent Labs Lab 11/21/16 1352  11/21/16 1717 11/21/16 2042 11/21/16 2305 11/22/16 0344 11/22/16 1400 11/22/16 1733 11/23/16 0309  WBC 2.4*  --   --   --   --  8.7  --   --  11.2*  CREATININE 1.98*  --   --   --   --  1.91*  --   --  1.71*  LATICACIDVEN  --   < > 6.34* 4.1* 3.8*  --  2.2* 3.1*  --   < > = values in this interval not displayed.  Estimated Creatinine Clearance: 24.4 mL/min (A) (by C-G formula based on SCr of 1.71 mg/dL (H)).    No Known Allergies  Antimicrobials this admission: 7/25 Vancomycin >> 7/27 7/25 Zosyn >>  Dose adjustments this admission:   Microbiology results: 7/25 BCx: ngtd 7/25 UCx: ngf 7/25 MRSA PCR: negative  Thank you for allowing pharmacy to be a part of this patient's care.  Adrian Saran, PharmD, BCPS Pager [phone] 11/23/2016 12:44 PM

## 2016-11-23 NOTE — Progress Notes (Signed)
[facility] - 0093 - [facility] - GIP RN visit at 0800am  This is a related and covered GIP admission of 11/21/16 with [facility] diagnosis of CHF per Dr. Cherie Ouch.  Patient has [place] DNR.  Caregiver activated EMS after patient had several episodes of vomiting.  Barb Merino, [facility] RN, was notified.   Patient was hard asleep when I visited at bedside this morning.  Patient had NG tube with green return to suction.  Patient had good rise and fall of chest.  Patient was in NAD at this time.  Patient had no yield in his foley bag, but it had just been emptied.  I spoke with Santiago Glad, RN, and she advised that patient has obstruction and they are not interested in surgery.  Patient to be moved to regular room today.  Unsure at this time if patient will be returning home versus wanting residential hospice.  Per Santiago Glad, RN, this is something that will need to be discussed with family.    Patient currently receiving enoxaparin (LOVENOX) injection 30 mg, Dose 30 mg, Q24h, via [place].  Continuous medications:  Dextrose 5% - 0.9% sodium chloride infusion, Rate 75 mL/hr, Continuous via IV; piperacillin-tazobactam (ZOSYN) IVPB 3.375 g, Dose 3.375 g, Q 8H, via IV; vancomycin (VANCOCIN) IVPB 1000 mg/ 200 mL premix, Dose 1,000 mg, Q48h via IV.  Patient has received no PRN medications today thus far.   We will continue to follow patient while hospitalized.  Please feel free to call with any hospice related questions or concerns.  Thank you,  Edyth Gunnels, RN, BSN [facility] Liaison [phone]  All hospital liaisons are now on [place].

## 2016-11-23 NOTE — Progress Notes (Signed)
PROGRESS NOTE    Russell [name]  UUV:[id number] DOB: 03-Dec-1923 DOA: 11/21/2016 PCP: Janith Lima, MD  Brief Narrative:Russell [name] is a 83 y.o. male with medical history significant for dementia, bedbound status, chronic systolic CHF EF of 47-42%, chronic respiratory failure on 2 liters of oxygen, chronic kidney disease stage III, diabetes mellitus, prostate cancer who is followed by hospice at home was brought to the emergency room by his daughter is due to increased shortness of breath, cough, nausea and vomiting  ED Course: Noted to be febrile, hypoxic requiring 4 L of oxygen stable blood pressure, lactic acid was significantly elevated at 7.9, Abd exam benignchest x-ray notable for ill-defined opacities at both lung bases concerning for pneumonia. 7/26 with VOmiting, found to have SBO, NGT placed, poor surgical candidate and family refused surgery  Assessment & Plan:   Principal Problem:   Sepsis due to SBO and Aspiration pneumonia ([place]) - stable and afebrile - blood Cx negative - stop Vancomycin, continue Zosyn - suspect aspiration was likely due to SBO   SBO -due to adhesions, +/- Ventral hernia -very poor surgical candidate with multitude of medical issues -continue NPo, IVF, NGT to ILWS -appreciate Surgical input per Dr.Rosenbauer, family declined surgery -continue Zosyn -if fails conservative mgt, Comfort care recommended to Family    Moderate dementia with behavioral disturbance -Depakote held, low dose Ativan PRN, on xanax at home    Diabetes mellitus type 2, diet-controlled ([place]) -He is not on any home meds for diabetes -CBGs stable, FU Hba1c    Chronic systolic CHF (congestive heart failure) (HCC) -EF 20-25% -Hold torsemide, imdur, resume hydralazine IV since BP uncontrolled -continue IVF now    CKD (chronic kidney disease), stage III -Creatinine close to baseline, holding diuretics   ETHICs: This is a frail elderly bedbound patient with dementia, chronic  systolic CHF, chronic respiratory failure admitted with sepsis/aspiration pneumonia and SBO followed by home hospice, he is DNR. -I have discussed with them that we will try to support him with fluids and antibiotics, NG decompression, however if he does not respond to above treatment will comfort care, Hospice team following  DVT prophylaxis: Lovenox Code Status: DO NOT RESUSCITATE  Family Communication: None at bedside now, d/w Daughters yesterday Disposition Plan: Tx to floor    Antimicrobials:   Vanc/Zosyn   Subjective: Some abd discomfort, confused Objective: Vitals:   11/23/16 0600 11/23/16 0800 11/23/16 0900 11/23/16 1000  BP: (!) 157/45 (!) 182/41 (!) 160/61 (!) 169/61  Pulse: (!) 57 (!) 51 (!) 58 (!) 56  Resp: 12 10 11 12   Temp:  (!) 97.5 F (36.4 C)    TempSrc:  Oral    SpO2: 94% 95% 96% 96%  Weight:      Height:        Intake/Output Summary (Last 24 hours) at 11/23/16 1115 Last data filed at 11/23/16 1100  Gross per 24 hour  Intake             1395 ml  Output              900 ml  Net              495 ml   Filed Weights   11/21/16 1832 11/22/16 0410 11/23/16 0503  Weight: 59.1 kg (130 lb 4.7 oz) 60.2 kg (132 lb 11.5 oz) 61.2 kg (134 lb 14.7 oz)    Examination:  Gen: elderly frail male, somnolent, opens eyes,  HEENT: PERRLA, NGT in situ Lungs: decreased BS  at bases CVS: RRR,No Gallops,Rubs or new Murmurs Abd very taut, mild periumb tenderness, BS decreased but present Extremities: No Cyanosis, Clubbing or edema Skin: no new rashes  Data Reviewed:   CBC:  Recent Labs Lab 11/21/16 1352 11/22/16 0344 11/23/16 0309  WBC 2.4* 8.7 11.2*  HGB 14.2 11.6* 10.7*  HCT 42.0 34.4* 31.6*  MCV 88.2 85.6 84.3  PLT 243 208 742   Basic Metabolic Panel:  Recent Labs Lab 11/21/16 1352 11/22/16 0344 11/23/16 0309  NA 141 139 141  K 4.6 4.9 4.4  CL 96* 101 103  CO2 29 29 28   GLUCOSE 166* 155* 90  BUN 26* 35* 47*  CREATININE 1.98* 1.91* 1.71*    CALCIUM 9.0 8.2* 8.1*   GFR: Estimated Creatinine Clearance: 24.4 mL/min (A) (by C-G formula based on SCr of 1.71 mg/dL (H)). Liver Function Tests:  Recent Labs Lab 11/21/16 1352  AST 37  ALT 12*  ALKPHOS 103  BILITOT 0.7  PROT 7.1  ALBUMIN 3.2*    Recent Labs Lab 11/21/16 1352  LIPASE 19   No results for input(s): AMMONIA in the last 168 hours. Coagulation Profile: No results for input(s): INR, PROTIME in the last 168 hours. Cardiac Enzymes: No results for input(s): CKTOTAL, CKMB, CKMBINDEX, TROPONINI in the last 168 hours. BNP (last 3 results) No results for input(s): PROBNP in the last 8760 hours. HbA1C:  Recent Labs  11/21/16 1352  HGBA1C 4.9   CBG:  Recent Labs Lab 11/22/16 1154 11/22/16 1620 11/22/16 2146 11/23/16 0742 11/23/16 1022  GLUCAP 104* 112* 102* 73 83   Lipid Profile: No results for input(s): CHOL, HDL, LDLCALC, TRIG, CHOLHDL, LDLDIRECT in the last 72 hours. Thyroid Function Tests: No results for input(s): TSH, T4TOTAL, FREET4, T3FREE, THYROIDAB in the last 72 hours. Anemia Panel: No results for input(s): VITAMINB12, FOLATE, FERRITIN, TIBC, IRON, RETICCTPCT in the last 72 hours. Urine analysis:    Component Value Date/Time   COLORURINE AMBER (A) 11/21/2016 1427   APPEARANCEUR CLOUDY (A) 11/21/2016 1427   LABSPEC 1.018 11/21/2016 1427   PHURINE 5.0 11/21/2016 1427   GLUCOSEU NEGATIVE 11/21/2016 1427   GLUCOSEU NEGATIVE 12/13/2015 1559   HGBUR NEGATIVE 11/21/2016 1427   HGBUR large 08/03/2008 1258   BILIRUBINUR SMALL (A) 11/21/2016 1427   BILIRUBINUR neg 04/09/2014 1056   KETONESUR 5 (A) 11/21/2016 1427   PROTEINUR 30 (A) 11/21/2016 1427   UROBILINOGEN 0.2 12/13/2015 1559   NITRITE NEGATIVE 11/21/2016 1427   LEUKOCYTESUR SMALL (A) 11/21/2016 1427   Sepsis Labs: @LABRCNTIP (procalcitonin:4,lacticidven:4)  ) Recent Results (from the past 240 hour(s))  Urine culture     Status: None   Collection Time: 11/21/16  2:27 PM  Result  Value Ref Range Status   Specimen Description URINE, CATHETERIZED  Final   Special Requests Normal  Final   Culture   Final    NO GROWTH Performed at [facility], [place] [address], [place], [place] [address]    Report Status 11/22/2016 FINAL  Final  Blood Culture (routine x 2)     Status: None (Preliminary result)   Collection Time: 11/21/16  3:28 PM  Result Value Ref Range Status   Specimen Description BLOOD LEFT WRIST  Final   Special Requests IN PEDIATRIC BOTTLE Blood Culture adequate volume  Final   Culture   Final    NO GROWTH < 24 HOURS Performed at [facility], [place] [address] [place],  [address]    Report Status PENDING  Incomplete  Blood Culture (routine x 2)     Status: None (Preliminary result)   Collection Time: 11/21/16  3:39 PM  Result Value Ref Range Status   Specimen Description BLOOD LEFT WRIST  Final   Special Requests IN PEDIATRIC BOTTLE Blood Culture adequate volume  Final   Culture   Final    NO GROWTH < 24 HOURS Performed at [facility], [place] [address], [place], [place] [address]    Report Status PENDING  Incomplete  MRSA PCR Screening     Status: None   Collection Time: 11/21/16  6:36 PM  Result Value Ref Range Status   MRSA by PCR NEGATIVE NEGATIVE Final    Comment:        The GeneXpert MRSA Assay (FDA approved for NASAL specimens only), is one component of a comprehensive MRSA colonization surveillance program. It is not intended to diagnose MRSA infection nor to guide or monitor treatment for MRSA infections.          Radiology Studies: Ct Abdomen Pelvis Wo Contrast  Result Date: 11/22/2016 CLINICAL DATA:  83 year old male with a history of nausea and vomiting EXAM: CT ABDOMEN AND PELVIS WITHOUT CONTRAST TECHNIQUE: Multidetector CT imaging of the abdomen and pelvis was performed following the standard protocol without IV contrast. COMPARISON:  03/10/2014 FINDINGS: Lower chest: Centrilobular nodularity of the right  lower lobe with more nodular confluent dependent changes. Trace right-sided pleural effusion. Confluent opacity of the left lung base with volume loss and crowding of the airways. Trace left-sided pleural effusion. Pericardial fluid/ thickening.  Native coronary calcifications. Hepatobiliary: Unremarkable appearance of liver. Unremarkable gallbladder. Pancreas: Atrophic pancreas parenchyma. Spleen: Unremarkable spleen Adrenals/Urinary Tract: Unremarkable appearance of the adrenal glands. Nonobstructive right-sided nephrolithiasis with stone in the pelvis measuring 11 mm. Unremarkable course of the right ureter. Unremarkable appearance of left kidney without hydronephrosis. Unremarkable left ureter. Unremarkable urinary bladder. Stomach/Bowel: Distention of stomach with retained enteric contrast. Small hiatal hernia. Dilated jejunum and ileum with multiple air-fluid levels. There is a short segment of small bowel entering a ventral hernia above the umbilicus. The primary transition site of small bowel is favored to be within the central small bowel just anterior to the aorta on axial images, though best seen on sagittal reformatted images (image 61 of series 5.) beyond this small bowel and colon are decompressed. Colonic diverticular disease. Surgical changes of prior right partial colectomy Vascular/Lymphatic: Vascular calcifications of the abdominal aorta at the origin of the mesenteric vessels and renal arteries. No aneurysm. Calcifications of the bilateral iliac vasculature. Reproductive: Calcifications of the prostate. Other: Edema of the body wall. Fluid containing right inguinal hernia. Musculoskeletal: Osteopenia. Flowing anterior osteophytes of the thoracolumbar spine. No acute fracture. No bony canal narrowing. Degenerative changes of degenerative disc disease and facet disease of the lumbar region. Degenerative changes at the bilateral hips. IMPRESSION: High-grade small bowel obstruction with the primary  transition point in the mid abdomen. This is favored to be secondary to either adhesions, or a tethered small bowel loop, potentially related to the ventral hernia. Although there is a short segment of small bowel entering the ventral hernia above the umbilicus, this does not appear to be the primary transition point. Surgical consultation recommended. These results were called by telephone at the time of interpretation on 11/22/2016 at 3:55 pm to the nurse caring for the patient, Ms Arlyss Gandy, who verbally acknowledged these results. Surgical changes of prior partial right colectomy. Body wall edema/anasarca. Trace pericardial fluid/thickening. Centrilobular nodularity of the  bilateral lower lobes with volume loss. Changes may reflect aspiration pneumonitis/pneumonia. Small associated pleural effusions. Aortic Atherosclerosis (ICD10-I70.0). Associated coronary artery disease. Nonobstructive right-sided nephrolithiasis. Electronically Signed   By: Corrie Mckusick D.O.   On: 11/22/2016 15:57   Dg Chest 2 View  Result Date: 11/21/2016 CLINICAL DATA:  83 year old male with a history of congestive heart failure. Possible aspiration. EXAM: CHEST  2 VIEW COMPARISON:  10/14/2016, 08/01/2016, 01/12/2016 FINDINGS: Cardiomediastinal silhouette unchanged. Calcifications of the aortic arch. No pneumothorax. Anterior view demonstrates airspace opacity at the medial right base, retrocardiac opacity, and ill-defined opacity at the posterior lung base on the lateral view. No displaced fracture IMPRESSION: Ill-defined airspace opacity at the bilateral lung bases, potentially related to aspiration. Electronically Signed   By: Corrie Mckusick D.O.   On: 11/21/2016 15:17        Scheduled Meds: . enoxaparin (LOVENOX) injection  30 mg Subcutaneous Q24H  . metoprolol tartrate  5 mg Intravenous Q6H   Continuous Infusions: . dextrose 5 % and 0.9% NaCl 75 mL/hr at 11/23/16 1100  . piperacillin-tazobactam (ZOSYN)  IV 3.375 g  (11/23/16 0739)  . vancomycin       LOS: 2 days    Time spent: 54min    Domenic Polite, MD [facility] Pager [phone]  If 7PM-7AM, please contact night-coverage [email or web address] Password [email or web address] 11/23/2016, 11:15 AM

## 2016-11-23 NOTE — Progress Notes (Signed)
Pt's heart rate has flipped back and forth bt sinus brady and junctional ryhthm for the last 45 minutes.  VS stable MD notified.

## 2016-11-23 NOTE — Progress Notes (Signed)
   11/23/16 1100  Clinical Encounter Type  Visited With Patient;Patient not available (Patient asleep)  Visit Type Initial;Psychological support;Spiritual support;Critical Care  Referral From Nurse  Consult/Referral To Chaplain  Spiritual Encounters  Spiritual Needs Prayer;Emotional;Other (Comment) (Pastoral Conversation/Support)  Stress Factors  Patient Stress Factors Not reviewed   Patient asleep at time of visit. Will follow-up.  [place] M.Div.

## 2016-11-23 NOTE — Progress Notes (Signed)
SLP Cancellation Note  Patient Details Name: Russell [name] MRN: [id number] DOB: 07/20/1923   Cancelled treatment:        Pt with obstruction, NG tube.  Please reorder SLP evaluation if indicated.  Thanks.    Macario Golds 11/23/2016, 1:06 PM Luanna Salk, [facility] SLP [phone]

## 2016-11-23 NOTE — Progress Notes (Signed)
Pt transferred to 1503, tele.  Daughter [name] notified.

## 2016-11-23 NOTE — Progress Notes (Signed)
Pts daughter called to check on her father.  She expressed concern about care for him after discharge.  Her other sister who has been helping to care for him at home has a husband who is suffering from cancer in the hospital.  Will consult social work.

## 2016-11-24 ENCOUNTER — Inpatient Hospital Stay (HOSPITAL_COMMUNITY)

## 2016-11-24 DIAGNOSIS — L8990 Pressure ulcer of unspecified site, unspecified stage: Secondary | ICD-10-CM | POA: Insufficient documentation

## 2016-11-24 LAB — GLUCOSE, CAPILLARY
GLUCOSE-CAPILLARY: 145 mg/dL — AB (ref 65–99)
Glucose-Capillary: 149 mg/dL — ABNORMAL HIGH (ref 65–99)
Glucose-Capillary: 142 mg/dL — ABNORMAL HIGH (ref 65–99)
Glucose-Capillary: 139 mg/dL — ABNORMAL HIGH (ref 65–99)

## 2016-11-24 LAB — BASIC METABOLIC PANEL
Anion gap: 12 (ref 5–15)
BUN: 48 mg/dL — AB (ref 6–20)
CHLORIDE: 104 mmol/L (ref 101–111)
CO2: 28 mmol/L (ref 22–32)
CREATININE: 1.62 mg/dL — AB (ref 0.61–1.24)
Calcium: 8.3 mg/dL — ABNORMAL LOW (ref 8.9–10.3)
GFR calc Af Amer: 41 mL/min — ABNORMAL LOW (ref >60)
GFR calc non Af Amer: 35 mL/min — ABNORMAL LOW (ref >60)
Glucose, Bld: 158 mg/dL — ABNORMAL HIGH (ref 65–99)
Potassium: 3.2 mmol/L — ABNORMAL LOW (ref 3.5–5.1)
Sodium: 144 mmol/L (ref 135–145)

## 2016-11-24 LAB — CBC
HEMATOCRIT: 33.4 % — AB (ref 39.0–52.0)
HEMOGLOBIN: 11.3 g/dL — AB (ref 13.0–17.0)
MCH: 28.6 pg (ref 26.0–34.0)
MCHC: 33.8 g/dL (ref 30.0–36.0)
MCV: 84.6 fL (ref 78.0–100.0)
Platelets: 206 10*3/uL (ref 150–400)
RBC: 3.95 MIL/uL — ABNORMAL LOW (ref 4.22–5.81)
RDW: 15.2 % (ref 11.5–15.5)
WBC: 12.2 10*3/uL — ABNORMAL HIGH (ref 4.0–10.5)

## 2016-11-24 MED ORDER — POTASSIUM CHLORIDE 10 MEQ/100ML IV SOLN
10.0000 meq | INTRAVENOUS | Status: AC
  Administered 2016-11-24 (×3): 10 meq via INTRAVENOUS
  Filled 2016-11-24 (×3): qty 100

## 2016-11-24 MED ORDER — MORPHINE SULFATE (PF) 2 MG/ML IV SOLN
1.0000 mg | INTRAVENOUS | Status: DC | PRN
  Administered 2016-11-24: 1 mg via INTRAVENOUS
  Administered 2016-11-24: 2 mg via INTRAVENOUS
  Administered 2016-11-24: 1 mg via INTRAVENOUS
  Administered 2016-11-25 (×2): 2 mg via INTRAVENOUS
  Filled 2016-11-24 (×5): qty 1

## 2016-11-24 NOTE — Progress Notes (Addendum)
[facility]--[facility]--GIP RN visit at [place] am  This is a related and covered GIP admission of 11/21/16 with [facility] diagnosis of CHF per Dr. Karie Georges. Patient has [place] DNR. Caregiver activated EMS after patient had several episodes of vomiting. Barb Merino, [facility] RN, was notified.  Patient was restless in bed this morning. Mumbles in response to any questions and does not open eyes. No family is present at this time. NG tube with Sena liquid to intermittent suction. Foley with scant amount of clear yellow urine. Spoke with Dr. Broadus John, plan is to obtain xray to see if any progression of CT contrast. Pt currently remains NPO.  Pt is currently receiving Lovenox injection 30 mg QD, Hydralazine injection 10 mg Q 6 hours, Metoprolol injection 2.5 mg IV Q hours, Zosyn 3.375 g IV every 8 hours, Potassium chloride 10 mEq every 1 hour x 3. He has received Ativan 0.5 mg x 1 today.  Will continue to follow patient while in the hospital and anticipate any discharge needs. Please feel free to call with any hospice related questions or concerns.  Thank you, Margaretmary Eddy, [facility] Liaison [phone]  [place] are on AMION.

## 2016-11-24 NOTE — Progress Notes (Signed)
PROGRESS NOTE    Russell [name]  HFW:[id number] DOB: 02-28-1924 DOA: 11/21/2016 PCP: Janith Lima, MD  Brief Narrative:Russell [name] is a 83 y.o. male with medical history significant for dementia, bedbound status, chronic systolic CHF EF of 02-77%, chronic respiratory failure on 2 liters of oxygen, chronic kidney disease stage III, diabetes mellitus, prostate cancer who is followed by hospice at home was brought to the emergency room by his daughter is due to increased shortness of breath, cough, nausea and vomiting  ED Course: Noted to be febrile, hypoxic requiring 4 L of oxygen stable blood pressure, lactic acid was significantly elevated at 7.9, Abd exam benignchest x-ray notable for ill-defined opacities at both lung bases concerning for pneumonia. 7/26 with VOmiting, found to have SBO, NGT placed, poor surgical candidate and family refused surgery  Assessment & Plan:   Principal Problem:   Sepsis due to SBO and Aspiration pneumonia (Russell [name]) - no improvement, KUB shows persistent SBO - blood Cx negative - stopped Vancomycin, remains on IV Zosyn - suspect aspiration was likely due to SBO - called daughter and recommended [place] today, she will discuss with siblings and they will meet me tomorrow morning   SBO -due to adhesions, +/- Ventral hernia -very poor surgical candidate with multitude of medical issues -continue NPo, IVF, NGT to ILWS -appreciate Surgical input per Dr.Rosenbauer, family declined surgery -continue Zosyn -KUB no improvement -called daughter, comfort measures recommended, family meeting in morning with children    Moderate dementia with behavioral disturbance -Depakote held, low dose Ativan PRN, on xanax at home    Diabetes mellitus type 2, diet-controlled (Russell [name]) -He is not on any home meds for diabetes -CBGs stable, FU Hba1c    Chronic systolic CHF (congestive heart failure) (Russell [name]) -EF 20-25% -Hold torsemide, imdur, resume hydralazine IV since BP  uncontrolled -continue IVF now    CKD (chronic kidney disease), stage III -Creatinine close to baseline, holding diuretics   ETHICs: This is a frail elderly bedbound patient with dementia, chronic systolic CHF, chronic respiratory failure admitted with sepsis/aspiration pneumonia and SBO followed by home hospice, he is DNR. -no improvement with supportive care this far, recommended Comfort care to daughter Russell [name] and possibly [facility]  DVT prophylaxis: Lovenox Code Status: DO NOT RESUSCITATE  Family Communication: None at bedside now, d/w Daughter Russell [name] this am Disposition Plan: to be determined    Antimicrobials:   Vanc/Zosyn   Subjective: -no changes, uncomfortable  Objective: Vitals:   11/23/16 1420 11/23/16 1712 11/23/16 2136 11/24/16 0537  BP: (!) 121/49 (!) 146/80 (!) 131/56 (!) 119/58  Pulse: 61  71 70  Resp: 15  18 19   Temp: 98 F (36.7 C)  98.3 F (36.8 C) 98.2 F (36.8 C)  TempSrc: Axillary  Oral Oral  SpO2: 100%  97% 99%  Weight:      Height:        Intake/Output Summary (Last 24 hours) at 11/24/16 1018 Last data filed at 11/24/16 0807  Gross per 24 hour  Intake             2000 ml  Output              525 ml  Net             1475 ml   Filed Weights   11/21/16 1832 11/22/16 0410 11/23/16 0503  Weight: 59.1 kg (130 lb 4.7 oz) 60.2 kg (132 lb 11.5 oz) 61.2 kg (134 lb 14.7 oz)    Examination:  Gen: elderly frail male, somnolent, opens eyes, ill appearing HEENT: PERRLA, NGT in situ Lungs: decreased BS at bases CVS: RRR,No Gallops,Rubs or new Murmurs Abd very taut, mild periumb tenderness, BS decreased but present Extremities: No Cyanosis, Clubbing or edema Skin: no new rashes  Data Reviewed:   CBC:  Recent Labs Lab 11/21/16 1352 11/22/16 0344 11/23/16 0309 11/24/16 0535  WBC 2.4* 8.7 11.2* 12.2*  HGB 14.2 11.6* 10.7* 11.3*  HCT 42.0 34.4* 31.6* 33.4*  MCV 88.2 85.6 84.3 84.6  PLT 243 208 170 132   Basic Metabolic  Panel:  Recent Labs Lab 11/21/16 1352 11/22/16 0344 11/23/16 0309 11/24/16 0535  NA 141 139 141 144  K 4.6 4.9 4.4 3.2*  CL 96* 101 103 104  CO2 29 29 28 28   GLUCOSE 166* 155* 90 158*  BUN 26* 35* 47* 48*  CREATININE 1.98* 1.91* 1.71* 1.62*  CALCIUM 9.0 8.2* 8.1* 8.3*   GFR: Estimated Creatinine Clearance: 25.7 mL/min (A) (by C-G formula based on SCr of 1.62 mg/dL (H)). Liver Function Tests:  Recent Labs Lab 11/21/16 1352  AST 37  ALT 12*  ALKPHOS 103  BILITOT 0.7  PROT 7.1  ALBUMIN 3.2*    Recent Labs Lab 11/21/16 1352  LIPASE 19   No results for input(s): AMMONIA in the last 168 hours. Coagulation Profile: No results for input(s): INR, PROTIME in the last 168 hours. Cardiac Enzymes: No results for input(s): CKTOTAL, CKMB, CKMBINDEX, TROPONINI in the last 168 hours. BNP (last 3 results) No results for input(s): PROBNP in the last 8760 hours. HbA1C:  Recent Labs  11/21/16 1352  HGBA1C 4.9   CBG:  Recent Labs Lab 11/23/16 1022 11/23/16 1144 11/23/16 1710 11/23/16 2139 11/24/16 0806  GLUCAP 83 96 111* 131* 149*   Lipid Profile: No results for input(s): CHOL, HDL, LDLCALC, TRIG, CHOLHDL, LDLDIRECT in the last 72 hours. Thyroid Function Tests: No results for input(s): TSH, T4TOTAL, FREET4, T3FREE, THYROIDAB in the last 72 hours. Anemia Panel: No results for input(s): VITAMINB12, FOLATE, FERRITIN, TIBC, IRON, RETICCTPCT in the last 72 hours. Urine analysis:    Component Value Date/Time   COLORURINE AMBER (A) 11/21/2016 1427   APPEARANCEUR CLOUDY (A) 11/21/2016 1427   LABSPEC 1.018 11/21/2016 1427   PHURINE 5.0 11/21/2016 1427   GLUCOSEU NEGATIVE 11/21/2016 1427   GLUCOSEU NEGATIVE 12/13/2015 1559   HGBUR NEGATIVE 11/21/2016 1427   HGBUR large 08/03/2008 1258   BILIRUBINUR SMALL (A) 11/21/2016 1427   BILIRUBINUR neg 04/09/2014 1056   KETONESUR 5 (A) 11/21/2016 1427   PROTEINUR 30 (A) 11/21/2016 1427   UROBILINOGEN 0.2 12/13/2015 1559    NITRITE NEGATIVE 11/21/2016 1427   LEUKOCYTESUR SMALL (A) 11/21/2016 1427   Sepsis Labs: @LABRCNTIP (procalcitonin:4,lacticidven:4)  ) Recent Results (from the past 240 hour(s))  Urine culture     Status: None   Collection Time: 11/21/16  2:27 PM  Result Value Ref Range Status   Specimen Description URINE, CATHETERIZED  Final   Special Requests Normal  Final   Culture   Final    NO GROWTH Performed at [facility], [place] [address] [place], [place] [address]    Report Status 11/22/2016 FINAL  Final  Blood Culture (routine x 2)     Status: None (Preliminary result)   Collection Time: 11/21/16  3:28 PM  Result Value Ref Range Status   Specimen Description BLOOD LEFT WRIST  Final   Special Requests IN PEDIATRIC BOTTLE Blood Culture adequate volume  Final   Culture  Final    NO GROWTH 2 DAYS Performed at [facility], [place] [address] [place], [place] [address]    Report Status PENDING  Incomplete  Blood Culture (routine x 2)     Status: None (Preliminary result)   Collection Time: 11/21/16  3:39 PM  Result Value Ref Range Status   Specimen Description BLOOD LEFT WRIST  Final   Special Requests IN PEDIATRIC BOTTLE Blood Culture adequate volume  Final   Culture   Final    NO GROWTH 2 DAYS Performed at [facility], [place] [address] [place], [place] [address]    Report Status PENDING  Incomplete  MRSA PCR Screening     Status: None   Collection Time: 11/21/16  6:36 PM  Result Value Ref Range Status   MRSA by PCR NEGATIVE NEGATIVE Final    Comment:        The GeneXpert MRSA Assay (FDA approved for NASAL specimens only), is one component of a comprehensive MRSA colonization surveillance program. It is not intended to diagnose MRSA infection nor to guide or monitor treatment for MRSA infections.          Radiology Studies: Ct Abdomen Pelvis Wo Contrast  Result Date: 11/22/2016 CLINICAL DATA:  83 year old male with a history of nausea and vomiting  EXAM: CT ABDOMEN AND PELVIS WITHOUT CONTRAST TECHNIQUE: Multidetector CT imaging of the abdomen and pelvis was performed following the standard protocol without IV contrast. COMPARISON:  03/10/2014 FINDINGS: Lower chest: Centrilobular nodularity of the right lower lobe with more nodular confluent dependent changes. Trace right-sided pleural effusion. Confluent opacity of the left lung base with volume loss and crowding of the airways. Trace left-sided pleural effusion. Pericardial fluid/ thickening.  Native coronary calcifications. Hepatobiliary: Unremarkable appearance of liver. Unremarkable gallbladder. Pancreas: Atrophic pancreas parenchyma. Spleen: Unremarkable spleen Adrenals/Urinary Tract: Unremarkable appearance of the adrenal glands. Nonobstructive right-sided nephrolithiasis with stone in the pelvis measuring 11 mm. Unremarkable course of the right ureter. Unremarkable appearance of left kidney without hydronephrosis. Unremarkable left ureter. Unremarkable urinary bladder. Stomach/Bowel: Distention of stomach with retained enteric contrast. Small hiatal hernia. Dilated jejunum and ileum with multiple air-fluid levels. There is a short segment of small bowel entering a ventral hernia above the umbilicus. The primary transition site of small bowel is favored to be within the central small bowel just anterior to the aorta on axial images, though best seen on sagittal reformatted images (image 61 of series 5.) beyond this small bowel and colon are decompressed. Colonic diverticular disease. Surgical changes of prior right partial colectomy Vascular/Lymphatic: Vascular calcifications of the abdominal aorta at the origin of the mesenteric vessels and renal arteries. No aneurysm. Calcifications of the bilateral iliac vasculature. Reproductive: Calcifications of the prostate. Other: Edema of the body wall. Fluid containing right inguinal hernia. Musculoskeletal: Osteopenia. Flowing anterior osteophytes of the  thoracolumbar spine. No acute fracture. No bony canal narrowing. Degenerative changes of degenerative disc disease and facet disease of the lumbar region. Degenerative changes at the bilateral hips. IMPRESSION: High-grade small bowel obstruction with the primary transition point in the mid abdomen. This is favored to be secondary to either adhesions, or a tethered small bowel loop, potentially related to the ventral hernia. Although there is a short segment of small bowel entering the ventral hernia above the umbilicus, this does not appear to be the primary transition point. Surgical consultation recommended. These results were called by telephone at the time of interpretation on 11/22/2016 at 3:55 pm to the nurse  caring for the patient, Ms Arlyss Gandy, who verbally acknowledged these results. Surgical changes of prior partial right colectomy. Body wall edema/anasarca. Trace pericardial fluid/thickening. Centrilobular nodularity of the bilateral lower lobes with volume loss. Changes may reflect aspiration pneumonitis/pneumonia. Small associated pleural effusions. Aortic Atherosclerosis (ICD10-I70.0). Associated coronary artery disease. Nonobstructive right-sided nephrolithiasis. Electronically Signed   By: Corrie Mckusick D.O.   On: 11/22/2016 15:57   Dg Abd 1 View  Result Date: 11/24/2016 CLINICAL DATA:  Small bowel obstruction. EXAM: ABDOMEN - 1 VIEW COMPARISON:  CT 11/22/2016 FINDINGS: NG tube coils in the fundus of the stomach. Dilated small bowel loops again noted in the mid abdomen, not change since prior CT. Right nephrolithiasis noted. No free air. IMPRESSION: Continued small bowel obstruction pattern. NG tube coils in the fundus of the stomach. Electronically Signed   By: Rolm Baptise M.D.   On: 11/24/2016 09:56        Scheduled Meds: . enoxaparin (LOVENOX) injection  30 mg Subcutaneous Q24H  . hydrALAZINE  10 mg Intravenous Q6H  . metoprolol tartrate  2.5 mg Intravenous Q6H   Continuous  Infusions: . dextrose 5 % and 0.9% NaCl 75 mL/hr at 11/23/16 1100  . piperacillin-tazobactam (ZOSYN)  IV 3.375 g (11/24/16 0801)  . potassium chloride 10 mEq (11/24/16 1010)     LOS: 3 days    Time spent: 13min    Domenic Polite, MD [facility] Pager [phone]  If 7PM-7AM, please contact night-coverage [email or web address] Password [email or web address] 11/24/2016, 10:18 AM

## 2016-11-24 NOTE — Progress Notes (Signed)
Patient ID: Russell [name], male   DOB: Mar 21, 1924, 83 y.o.   MRN: [id number]   Progress Note: Acute Care Surgery Service   Chief Complaint/Subjective: Unable to illicit  Objective: Vital signs in last 24 hours: Temp:  [98 F (36.7 C)-98.3 F (36.8 C)] 98.2 F (36.8 C) (07/28 0537) Pulse Rate:  [47-71] 70 (07/28 0537) Resp:  [12-19] 19 (07/28 0537) BP: (119-185)/(40-80) 119/58 (07/28 0537) SpO2:  [95 %-100 %] 99 % (07/28 0537)    Intake/Output from previous day: 07/27 0701 - 07/28 0700 In: 2120 [I.V.:1620; NG/GT:400; IV Piggyback:100] Out: 525 [Urine:125; Emesis/NG output:400] Intake/Output this shift: No intake/output data recorded.  Lungs: cta  Cardiovascular: reg  Abd: mostly soft, distended, perhaps some TTP, no peritonitis  Extremities: no edema  cachetic  Lab Results: CBC   Recent Labs  11/23/16 0309 11/24/16 0535  WBC 11.2* 12.2*  HGB 10.7* 11.3*  HCT 31.6* 33.4*  PLT 170 206   BMET  Recent Labs  11/23/16 0309 11/24/16 0535  NA 141 144  K 4.4 3.2*  CL 103 104  CO2 28 28  GLUCOSE 90 158*  BUN 47* 48*  CREATININE 1.71* 1.62*  CALCIUM 8.1* 8.3*   PT/INR No results for input(s): LABPROT, INR in the last 72 hours. ABG No results for input(s): PHART, HCO3 in the last 72 hours.  Invalid input(s): PCO2, PO2  Studies/Results:  Anti-infectives: Anti-infectives    Start     Dose/Rate Route Frequency Ordered Stop   11/23/16 1600  vancomycin (VANCOCIN) IVPB 1000 mg/200 mL premix  Status:  Discontinued     1,000 mg 200 mL/hr over 60 Minutes Intravenous Every 48 hours 11/21/16 1602 11/23/16 1117   11/22/16 0000  piperacillin-tazobactam (ZOSYN) IVPB 3.375 g     3.375 g 12.5 mL/hr over 240 Minutes Intravenous Every 8 hours 11/21/16 1557     11/21/16 1430  piperacillin-tazobactam (ZOSYN) IVPB 3.375 g     3.375 g 100 mL/hr over 30 Minutes Intravenous  Once 11/21/16 1429 11/21/16 1558   11/21/16 1430  vancomycin (VANCOCIN) IVPB 1000 mg/200 mL premix      1,000 mg 200 mL/hr over 60 Minutes Intravenous  Once 11/21/16 1429 11/21/16 1702      Medications: Scheduled Meds: . enoxaparin (LOVENOX) injection  30 mg Subcutaneous Q24H  . hydrALAZINE  10 mg Intravenous Q6H  . metoprolol tartrate  2.5 mg Intravenous Q6H   Continuous Infusions: . dextrose 5 % and 0.9% NaCl 75 mL/hr at 11/23/16 1100  . piperacillin-tazobactam (ZOSYN)  IV 3.375 g (11/24/16 0801)  . potassium chloride 10 mEq (11/24/16 0859)   PRN Meds:.acetaminophen **OR** acetaminophen, iopamidol, LORazepam, ondansetron (ZOFRAN) IV  Assessment/Plan: Patient Active Problem List   Diagnosis Date Noted  . Malnutrition of moderate degree 11/22/2016  . Aspiration pneumonia ([place]) 11/21/2016  . Acute respiratory failure with hypoxia ([place]) 11/21/2016  . Lactic acidosis 11/21/2016  . Sepsis due to pneumonia ([place]) 11/21/2016  . Sepsis ([place]) 11/21/2016  . Hyperkalemia 10/15/2016  . AKI (acute kidney injury) ([place]) 10/14/2016  . Anemia 10/14/2016  . HCAP (healthcare-associated pneumonia) 10/14/2016  . Recurrent inguinal hernia of right side without obstruction or gangrene 05/22/2016  . Cachexia associated with cardiac disease 02/01/2016  . Chronic systolic CHF (congestive heart failure) ([place]) 01/01/2016  . Depression with anxiety 01/01/2016  . CKD (chronic kidney disease), stage III 01/01/2016  . Elevated troponin I level   . Physical deconditioning   . Other seasonal allergic rhinitis 09/29/2015  . Diabetes mellitus type 2, diet-controlled (  [place]) 07/19/2015  . Constipation 05/30/2015  . Cough 04/06/2015  . Routine general medical examination at a health care facility 01/27/2015  . Hyperglycemia 01/25/2015  . Moderate dementia with behavioral disturbance 11/05/2014  . DJD (degenerative joint disease) 02/20/2013  . Major depressive disorder, recurrent episode, severe, without mention of psychotic behavior 08/08/2011  . PVC's (premature ventricular contractions) 01/31/2009  .  URINARY INCONTINENCE 07/16/2008  . ADENOCARCINOMA, COLON 05/13/2006  . ADENOCARCINOMA, PROSTATE 05/13/2006  . Hyperlipidemia with target LDL less than 100 05/13/2006  . Essential hypertension, benign 05/13/2006   pSBO  Still with SBO. Cont NG to LIWS. Family has indicated they do not want surgery.  Consider palliative care consult  Disposition: see above  LOS: 3 days    Gayland Curry, MD [phone] [facility]

## 2016-11-25 ENCOUNTER — Inpatient Hospital Stay (HOSPITAL_COMMUNITY)

## 2016-11-25 LAB — BASIC METABOLIC PANEL
Anion gap: 8 (ref 5–15)
BUN: 39 mg/dL — AB (ref 6–20)
CHLORIDE: 111 mmol/L (ref 101–111)
CO2: 29 mmol/L (ref 22–32)
CREATININE: 1.38 mg/dL — AB (ref 0.61–1.24)
Calcium: 8.1 mg/dL — ABNORMAL LOW (ref 8.9–10.3)
GFR calc non Af Amer: 43 mL/min — ABNORMAL LOW (ref >60)
GFR, EST AFRICAN AMERICAN: 50 mL/min — AB (ref >60)
GLUCOSE: 135 mg/dL — AB (ref 65–99)
Potassium: 2.7 mmol/L — CL (ref 3.5–5.1)
Sodium: 148 mmol/L — ABNORMAL HIGH (ref 135–145)

## 2016-11-25 LAB — CBC
HEMATOCRIT: 30.4 % — AB (ref 39.0–52.0)
HEMOGLOBIN: 10.4 g/dL — AB (ref 13.0–17.0)
MCH: 28.7 pg (ref 26.0–34.0)
MCHC: 34.2 g/dL (ref 30.0–36.0)
MCV: 84 fL (ref 78.0–100.0)
Platelets: 182 10*3/uL (ref 150–400)
RBC: 3.62 MIL/uL — ABNORMAL LOW (ref 4.22–5.81)
RDW: 15.1 % (ref 11.5–15.5)
WBC: 7.6 10*3/uL (ref 4.0–10.5)

## 2016-11-25 LAB — GLUCOSE, CAPILLARY
GLUCOSE-CAPILLARY: 121 mg/dL — AB (ref 65–99)
Glucose-Capillary: 113 mg/dL — ABNORMAL HIGH (ref 65–99)

## 2016-11-25 MED ORDER — MORPHINE SULFATE (PF) 2 MG/ML IV SOLN
1.0000 mg | INTRAVENOUS | Status: DC | PRN
  Administered 2016-11-25: 2 mg via INTRAVENOUS
  Filled 2016-11-25: qty 1

## 2016-11-25 MED ORDER — KCL IN DEXTROSE-NACL 40-5-0.45 MEQ/L-%-% IV SOLN
INTRAVENOUS | Status: DC
  Administered 2016-11-25: 08:00:00 via INTRAVENOUS
  Filled 2016-11-25: qty 1000

## 2016-11-25 MED ORDER — HALOPERIDOL 2 MG PO TABS: 2.0000 mg | ORAL_TABLET | ORAL | 0 refills | Status: AC | PRN

## 2016-11-25 MED ORDER — POTASSIUM CHLORIDE 10 MEQ/100ML IV SOLN
10.0000 meq | INTRAVENOUS | Status: DC
  Administered 2016-11-25 (×2): 10 meq via INTRAVENOUS
  Filled 2016-11-25 (×5): qty 100

## 2016-11-25 MED ORDER — MORPHINE SULFATE (CONCENTRATE) 10 MG /0.5 ML PO SOLN: 5.0000 mg | ORAL | 0 refills | Status: AC | PRN

## 2016-11-25 MED ORDER — LORAZEPAM 2 MG/ML PO CONC: 1.0000 mg | ORAL | 0 refills | Status: AC | PRN

## 2016-11-25 MED ORDER — LORAZEPAM 2 MG/ML IJ SOLN
1.0000 mg | INTRAMUSCULAR | Status: DC | PRN
Start: 2016-11-25 — End: 2016-11-25

## 2016-11-25 MED ORDER — ACETAMINOPHEN 325 MG PO TABS: 650.0000 mg | ORAL_TABLET | Freq: Four times a day (QID) | ORAL | Status: AC | PRN

## 2016-11-25 NOTE — Progress Notes (Addendum)
[facility]--1503--[facility]--RN Visit at 1100 am  Met with patient in room, today he is surrounded by family. Met with Dr. Broadus John prior to entering room and she had had family meeting today. Pt's hospitalization and prognosis reviewed and recommendation is to move patient to [facility].  Met with daughter Drema Balzarine to confirm interest and explain [employer]. [name] agreeable to transfer today. Registration paper work completed, consents already on file with [facility].   Please fax discharge summary to [phone].  RN please call report to [phone].  [facility] called by myself for transport at 1205.  Thank you, Margaretmary Eddy, [facility] Liaison [phone]  [facility] hospital liaisons are on [place].

## 2016-11-25 NOTE — Discharge Summary (Signed)
Physician Discharge Summary  Russell [name] [name] DOB: Dec 21, 1923 DOA: 11/21/2016  PCP: Russell Lima, MD  Admit date: 11/21/2016 Discharge date: 11/25/2016  Time spent:45 minutes  Recommendations for Outpatient Follow-up:  1. [facility] for Comfort care   Discharge Diagnoses:  Principal Problem:   Sepsis due to pneumonia  And SBO   Small Bowel obstruction (HCC)   Moderate dementia with behavioral disturbance   Diabetes mellitus type 2, diet-controlled (HCC)   Chronic systolic CHF (congestive heart failure) (HCC)   CKD (chronic kidney disease), stage III   Aspiration pneumonia (HCC)   Acute respiratory failure with hypoxia (HCC)   Lactic acidosis   Sepsis (HCC)   Malnutrition of moderate degree   Discharge Condition: poor  Diet recommendation: comfort feeds  Filed Weights   11/21/16 1832 11/22/16 0410 11/23/16 0503  Weight: 59.1 kg (130 lb 4.7 oz) 60.2 kg (132 lb 11.5 oz) 61.2 kg (134 lb 14.7 oz)    History of present illness:  Russell [name] a 83 y.o.malewith medical history significant for dementia, bedbound status, chronic systolic CHF EF of 31-54%, chronic respiratory failure on 2 liters of oxygen, chronic kidney disease stage III, diabetes mellitus, prostate cancer, severe malnutrition who is followed by Hospice at home was brought to the emergency room by his daughter is due to increased shortness of breath, cough, nausea and vomiting  ED Course:Noted to be febrile, hypoxic requiring 4 L of oxygen stable blood pressure, lactic acid was significantly elevated at 7.9, Abd exam benignchest x-ray notable for ill-defined opacities at both lung bases concerning for pneumonia and subsequently SBO  Hospital Course:  Sepsis due to SBO and Aspiration pneumonia ([place]) -treated with IV antibiotics, IVF, Bowel rest, NG tube for decompression -patient had no improvement with conservative management for 5days, remains uncomfortable with NG and KUB shows persistent SBO -  appreciate Surgical consult, felt to be a poor surgical candidate and palliative care recommended -I had a family meeting and recommended Comfort care to extended family and daughters at bedside today, they are agreeable, we removed the NG tube and Hospice Liaison discussed [facility] with family and they are agreeable -morphine PRN added for comfort   SBO -due to adhesions, +/- Ventral hernia -very poor surgical candidate with multitude of medical issues, no improvement with conservative management -see above, now comfort care  Moderate dementia with behavioral disturbance -Depakote held, low dose Ativan PRN now was on xanax at home  Diabetes mellitus type 2, diet-controlled ([place]) -He is not on any home meds for diabetes -stopped CBGs  Chronic systolic CHF (congestive heart failure) (HCC) -EF 20-25% -now comfort care   Severe malnutrition -now comfort care, comfort feeds  CKD (chronic kidney disease), stage III -Creatinine close to baseline, holding diuretics  Code Status:DO NOT RESUSCITATE  Consultations:  [facility]-general Surgery  Hospice  Discharge Exam: Vitals:   11/24/16 2021 11/25/16 0624  BP: (!) 148/62 130/64  Pulse: 71 67  Resp: 18 18  Temp: 97.9 F (36.6 C) 97.9 F (36.6 C)    General: frail elderly male, poorly responsive, uncomfortable Cardiovascular: S1S2/tachycardic Respiratory: ronchi at bases  Discharge Instructions   Discharge Instructions    Discharge instructions    Complete by:  As directed    Comfort measures     Current Discharge Medication List    START taking these medications   Details  acetaminophen (TYLENOL) 325 MG tablet Take 2 tablets (650 mg total) by mouth every 6 (six) hours as needed for mild pain or  fever (or Fever >/= 101).    LORazepam (ATIVAN) 2 MG/ML concentrated solution Place 0.5 mLs (1 mg total) under the tongue every 4 (four) hours as needed for anxiety. Qty: 15 mL, Refills: 0    Morphine  Sulfate (MORPHINE CONCENTRATE) 10 mg / 0.5 ml concentrated solution Place 0.25 mLs (5 mg total) under the tongue every 2 (two) hours as needed for moderate pain, severe pain or shortness of breath. Qty: 15 mL, Refills: 0      CONTINUE these medications which have CHANGED   Details  haloperidol (HALDOL) 2 MG tablet Take 1 tablet (2 mg total) by mouth every 4 (four) hours as needed for agitation (for restlessness). Qty: 10 tablet, Refills: 0      CONTINUE these medications which have NOT CHANGED   Details  DULCOLAX 10 MG suppository Place 10 mg rectally daily as needed for moderate constipation.  Refills: 0      STOP taking these medications     ALPRAZolam (XANAX) 0.5 MG tablet      aspirin EC 81 MG tablet      carvedilol (COREG) 3.125 MG tablet      divalproex (DEPAKOTE ER) 250 MG 24 hr tablet      fesoterodine (TOVIAZ) 8 MG TB24 tablet      HYDROcodone-acetaminophen (NORCO/VICODIN) 5-325 MG tablet      HYDROcodone-homatropine (HYCODAN) 5-1.5 MG/5ML syrup      potassium chloride SA (K-DUR,KLOR-CON) 20 MEQ tablet      prochlorperazine (COMPAZINE) 10 MG tablet      promethazine-dextromethorphan (PROMETHAZINE-DM) 6.25-15 MG/5ML syrup      senna-docusate (SENOKOT-S) 8.6-50 MG tablet      sertraline (ZOLOFT) 100 MG tablet      torsemide (DEMADEX) 20 MG tablet      vitamin B-12 (CYANOCOBALAMIN) 1000 MCG tablet        No Known Allergies    The results of significant diagnostics from this hospitalization (including imaging, microbiology, ancillary and laboratory) are listed below for reference.    Significant Diagnostic Studies: Ct Abdomen Pelvis Wo Contrast  Result Date: 11/22/2016 CLINICAL DATA:  83 year old male with a history of nausea and vomiting EXAM: CT ABDOMEN AND PELVIS WITHOUT CONTRAST TECHNIQUE: Multidetector CT imaging of the abdomen and pelvis was performed following the standard protocol without IV contrast. COMPARISON:  03/10/2014 FINDINGS: Lower chest:  Centrilobular nodularity of the right lower lobe with more nodular confluent dependent changes. Trace right-sided pleural effusion. Confluent opacity of the left lung base with volume loss and crowding of the airways. Trace left-sided pleural effusion. Pericardial fluid/ thickening.  Native coronary calcifications. Hepatobiliary: Unremarkable appearance of liver. Unremarkable gallbladder. Pancreas: Atrophic pancreas parenchyma. Spleen: Unremarkable spleen Adrenals/Urinary Tract: Unremarkable appearance of the adrenal glands. Nonobstructive right-sided nephrolithiasis with stone in the pelvis measuring 11 mm. Unremarkable course of the right ureter. Unremarkable appearance of left kidney without hydronephrosis. Unremarkable left ureter. Unremarkable urinary bladder. Stomach/Bowel: Distention of stomach with retained enteric contrast. Small hiatal hernia. Dilated jejunum and ileum with multiple air-fluid levels. There is a short segment of small bowel entering a ventral hernia above the umbilicus. The primary transition site of small bowel is favored to be within the central small bowel just anterior to the aorta on axial images, though best seen on sagittal reformatted images (image 61 of series 5.) beyond this small bowel and colon are decompressed. Colonic diverticular disease. Surgical changes of prior right partial colectomy Vascular/Lymphatic: Vascular calcifications of the abdominal aorta at the origin of the mesenteric vessels and  renal arteries. No aneurysm. Calcifications of the bilateral iliac vasculature. Reproductive: Calcifications of the prostate. Other: Edema of the body wall. Fluid containing right inguinal hernia. Musculoskeletal: Osteopenia. Flowing anterior osteophytes of the thoracolumbar spine. No acute fracture. No bony canal narrowing. Degenerative changes of degenerative disc disease and facet disease of the lumbar region. Degenerative changes at the bilateral hips. IMPRESSION: High-grade  small bowel obstruction with the primary transition point in the mid abdomen. This is favored to be secondary to either adhesions, or a tethered small bowel loop, potentially related to the ventral hernia. Although there is a short segment of small bowel entering the ventral hernia above the umbilicus, this does not appear to be the primary transition point. Surgical consultation recommended. These results were called by telephone at the time of interpretation on 11/22/2016 at 3:55 pm to the nurse caring for the patient, Ms Arlyss Gandy, who verbally acknowledged these results. Surgical changes of prior partial right colectomy. Body wall edema/anasarca. Trace pericardial fluid/thickening. Centrilobular nodularity of the bilateral lower lobes with volume loss. Changes may reflect aspiration pneumonitis/pneumonia. Small associated pleural effusions. Aortic Atherosclerosis (ICD10-I70.0). Associated coronary artery disease. Nonobstructive right-sided nephrolithiasis. Electronically Signed   By: Corrie Mckusick D.O.   On: 11/22/2016 15:57   Dg Chest 2 View  Result Date: 11/21/2016 CLINICAL DATA:  83 year old male with a history of congestive heart failure. Possible aspiration. EXAM: CHEST  2 VIEW COMPARISON:  10/14/2016, 08/01/2016, 01/12/2016 FINDINGS: Cardiomediastinal silhouette unchanged. Calcifications of the aortic arch. No pneumothorax. Anterior view demonstrates airspace opacity at the medial right base, retrocardiac opacity, and ill-defined opacity at the posterior lung base on the lateral view. No displaced fracture IMPRESSION: Ill-defined airspace opacity at the bilateral lung bases, potentially related to aspiration. Electronically Signed   By: Corrie Mckusick D.O.   On: 11/21/2016 15:17   Dg Abd 1 View  Result Date: 11/25/2016 CLINICAL DATA:  Small bowel obstruction EXAM: ABDOMEN - 1 VIEW COMPARISON:  11/24/2016 FINDINGS: The NG tube has retracted with the tip in the distal esophagus. Continued small  bowel dilatation, not significantly changed. No free air organomegaly. IMPRESSION: Continue small bowel obstruction pattern. NG tube has been retracted with the tip now in the distal esophagus. These results will be called to the ordering clinician or representative by the Radiologist Assistant, and communication documented in the PACS or zVision Dashboard. Electronically Signed   By: Rolm Baptise M.D.   On: 11/25/2016 07:54   Dg Abd 1 View  Result Date: 11/24/2016 CLINICAL DATA:  Small bowel obstruction. EXAM: ABDOMEN - 1 VIEW COMPARISON:  CT 11/22/2016 FINDINGS: NG tube coils in the fundus of the stomach. Dilated small bowel loops again noted in the mid abdomen, not change since prior CT. Right nephrolithiasis noted. No free air. IMPRESSION: Continued small bowel obstruction pattern. NG tube coils in the fundus of the stomach. Electronically Signed   By: Rolm Baptise M.D.   On: 11/24/2016 09:56    Microbiology: Recent Results (from the past 240 hour(s))  Urine culture     Status: None   Collection Time: 11/21/16  2:27 PM  Result Value Ref Range Status   Specimen Description URINE, CATHETERIZED  Final   Special Requests Normal  Final   Culture   Final    NO GROWTH Performed at [facility], [address] [place],  [address]    Report Status 11/22/2016 FINAL  Final  Blood Culture (routine x 2)     Status: None (Preliminary result)  Collection Time: 11/21/16  3:28 PM  Result Value Ref Range Status   Specimen Description BLOOD LEFT WRIST  Final   Special Requests IN PEDIATRIC BOTTLE Blood Culture adequate volume  Final   Culture   Final    NO GROWTH 3 DAYS Performed at [facility], [address], [place], [place] [address]    Report Status PENDING  Incomplete  Blood Culture (routine x 2)     Status: None (Preliminary result)   Collection Time: 11/21/16  3:39 PM  Result Value Ref Range Status   Specimen Description BLOOD LEFT WRIST  Final   Special Requests IN  PEDIATRIC BOTTLE Blood Culture adequate volume  Final   Culture   Final    NO GROWTH 3 DAYS Performed at [facility], [place] [address] [place], [place] [address]    Report Status PENDING  Incomplete  MRSA PCR Screening     Status: None   Collection Time: 11/21/16  6:36 PM  Result Value Ref Range Status   MRSA by PCR NEGATIVE NEGATIVE Final    Comment:        The GeneXpert MRSA Assay (FDA approved for NASAL specimens only), is one component of a comprehensive MRSA colonization surveillance program. It is not intended to diagnose MRSA infection nor to guide or monitor treatment for MRSA infections.      Labs: Basic Metabolic Panel:  Recent Labs Lab 11/21/16 1352 11/22/16 0344 11/23/16 0309 11/24/16 0535 11/25/16 0539  NA 141 139 141 144 148*  K 4.6 4.9 4.4 3.2* 2.7*  CL 96* 101 103 104 111  CO2 29 29 28 28 29   GLUCOSE 166* 155* 90 158* 135*  BUN 26* 35* 47* 48* 39*  CREATININE 1.98* 1.91* 1.71* 1.62* 1.38*  CALCIUM 9.0 8.2* 8.1* 8.3* 8.1*   Liver Function Tests:  Recent Labs Lab 11/21/16 1352  AST 37  ALT 12*  ALKPHOS 103  BILITOT 0.7  PROT 7.1  ALBUMIN 3.2*    Recent Labs Lab 11/21/16 1352  LIPASE 19   No results for input(s): AMMONIA in the last 168 hours. CBC:  Recent Labs Lab 11/21/16 1352 11/22/16 0344 11/23/16 0309 11/24/16 0535 11/25/16 0539  WBC 2.4* 8.7 11.2* 12.2* 7.6  HGB 14.2 11.6* 10.7* 11.3* 10.4*  HCT 42.0 34.4* 31.6* 33.4* 30.4*  MCV 88.2 85.6 84.3 84.6 84.0  PLT 243 208 170 206 182   Cardiac Enzymes: No results for input(s): CKTOTAL, CKMB, CKMBINDEX, TROPONINI in the last 168 hours. BNP: BNP (last 3 results)  Recent Labs  12/16/15 1532 12/31/15 2350 10/14/16 1610  BNP 2,189.3* 2,335.5* 1,616.6*    ProBNP (last 3 results) No results for input(s): PROBNP in the last 8760 hours.  CBG:  Recent Labs Lab 11/24/16 1209 11/24/16 1713 11/24/16 2019 11/25/16 0733 11/25/16 1155  GLUCAP 142* 145* 139* 121*  113*       SignedDomenic Polite MD.  [facility] 11/25/2016, 11:59 AM

## 2016-11-25 NOTE — Progress Notes (Deleted)
PROGRESS NOTE    Russell [name]  WNI:[id number] DOB: [birth year] DOA: 11/21/2016 PCP: Janith Lima, MD  Brief Narrative:Russell [name] is a 83 y.o. male with medical history significant for dementia, bedbound status, chronic systolic CHF EF of 38-18%, chronic respiratory failure on 2 liters of oxygen, chronic kidney disease stage III, diabetes mellitus, prostate cancer, severe malnutrition who is followed by Hospice at home was brought to the emergency room by his daughter is due to increased shortness of breath, cough, nausea and vomiting  ED Course: Noted to be febrile, hypoxic requiring 4 L of oxygen stable blood pressure, lactic acid was significantly elevated at 7.9, Abd exam benignchest x-ray notable for ill-defined opacities at both lung bases concerning for pneumonia. 7/26 with VOmiting, found to have SBO, NGT placed, poor surgical candidate and family refused surgery 7/29, family meeting conducted and recommended Comfort care given failure to respond to conservative mgt, family agrees will DC NGT, Hospice to work on [place]:     Sepsis due to SBO and Aspiration pneumonia ([place]) - no improvement with conservative management KUB shows persistent SBO - blood Cx negative - on IV Zosyn - suspect aspiration was likely due to SBO - appreciate Surgical consult, poor surgical candidate and palliative care recommended -I had a family meeting and recommended Comfort care to extended family and daughters at bedside today, they are agreeable and discussing [facility] with Hospice Liason -DC NG tube   SBO -due to adhesions, +/- Ventral hernia -very poor surgical candidate with multitude of medical issues, no improvement with conservative management -see above, now comfort care    Moderate dementia with behavioral disturbance -Depakote held, low dose Ativan PRN, on xanax at home    Diabetes mellitus type 2, diet-controlled ([place]) -He is not on any home meds for  diabetes -DC CBGs    Chronic systolic CHF (congestive heart failure) ([place]) -EF 20-25% -now comfort care    CKD (chronic kidney disease), stage III -Creatinine close to baseline, holding diuretics  DVT prophylaxis: Lovenox Code Status: DO NOT RESUSCITATE  Family Communication: d/w Daughter Drema Balzarine and extended family Disposition Plan: [facility]    Antimicrobials:   Vanc/Zosyn   Subjective: -no changes, uncomfortable, poorly responsive Objective: Vitals:   11/24/16 1320 11/24/16 1742 11/24/16 2021 11/25/16 0624  BP: 139/78 (!) 135/45 (!) 148/62 130/64  Pulse: 93 67 71 67  Resp: 20  18 18   Temp: 97.9 F (36.6 C)  97.9 F (36.6 C) 97.9 F (36.6 C)  TempSrc: Axillary  Axillary Axillary  SpO2: 98%  96% 98%  Weight:      Height:        Intake/Output Summary (Last 24 hours) at 11/25/16 1144 Last data filed at 11/25/16 1012  Gross per 24 hour  Intake          1571.25 ml  Output              300 ml  Net          1271.25 ml   Filed Weights   11/21/16 1832 11/22/16 0410 11/23/16 0503  Weight: 59.1 kg (130 lb 4.7 oz) 60.2 kg (132 lb 11.5 oz) 61.2 kg (134 lb 14.7 oz)    Examination:  Gen: Frail elderly male, very uncomfortable, laying in bed HEENT: Poor dental hygiene, oral mucosa moist Lungs:  rhonchi at both bases CVS: S1-S2, tachycardic Abdomen, very taut, mild diffuse tenderness, bowel sounds diminished Extremities: No Cyanosis, Clubbing or edema Skin: no  new rashes   Data Reviewed:   CBC:  Recent Labs Lab 11/21/16 1352 11/22/16 0344 11/23/16 0309 11/24/16 0535 11/25/16 0539  WBC 2.4* 8.7 11.2* 12.2* 7.6  HGB 14.2 11.6* 10.7* 11.3* 10.4*  HCT 42.0 34.4* 31.6* 33.4* 30.4*  MCV 88.2 85.6 84.3 84.6 84.0  PLT 243 208 170 206 527   Basic Metabolic Panel:  Recent Labs Lab 11/21/16 1352 11/22/16 0344 11/23/16 0309 11/24/16 0535 11/25/16 0539  NA 141 139 141 144 148*  K 4.6 4.9 4.4 3.2* 2.7*  CL 96* 101 103 104 111  CO2 29 29 28 28  29   GLUCOSE 166* 155* 90 158* 135*  BUN 26* 35* 47* 48* 39*  CREATININE 1.98* 1.91* 1.71* 1.62* 1.38*  CALCIUM 9.0 8.2* 8.1* 8.3* 8.1*   GFR: Estimated Creatinine Clearance: 30.2 mL/min (A) (by C-G formula based on SCr of 1.38 mg/dL (H)). Liver Function Tests:  Recent Labs Lab 11/21/16 1352  AST 37  ALT 12*  ALKPHOS 103  BILITOT 0.7  PROT 7.1  ALBUMIN 3.2*    Recent Labs Lab 11/21/16 1352  LIPASE 19   No results for input(s): AMMONIA in the last 168 hours. Coagulation Profile: No results for input(s): INR, PROTIME in the last 168 hours. Cardiac Enzymes: No results for input(s): CKTOTAL, CKMB, CKMBINDEX, TROPONINI in the last 168 hours. BNP (last 3 results) No results for input(s): PROBNP in the last 8760 hours. HbA1C: No results for input(s): HGBA1C in the last 72 hours. CBG:  Recent Labs Lab 11/24/16 0806 11/24/16 1209 11/24/16 1713 11/24/16 2019 11/25/16 0733  GLUCAP 149* 142* 145* 139* 121*   Lipid Profile: No results for input(s): CHOL, HDL, LDLCALC, TRIG, CHOLHDL, LDLDIRECT in the last 72 hours. Thyroid Function Tests: No results for input(s): TSH, T4TOTAL, FREET4, T3FREE, THYROIDAB in the last 72 hours. Anemia Panel: No results for input(s): VITAMINB12, FOLATE, FERRITIN, TIBC, IRON, RETICCTPCT in the last 72 hours. Urine analysis:    Component Value Date/Time   COLORURINE AMBER (A) 11/21/2016 1427   APPEARANCEUR CLOUDY (A) 11/21/2016 1427   LABSPEC 1.018 11/21/2016 1427   PHURINE 5.0 11/21/2016 1427   GLUCOSEU NEGATIVE 11/21/2016 1427   GLUCOSEU NEGATIVE 12/13/2015 1559   HGBUR NEGATIVE 11/21/2016 1427   HGBUR large 08/03/2008 1258   BILIRUBINUR SMALL (A) 11/21/2016 1427   BILIRUBINUR neg 04/09/2014 1056   KETONESUR 5 (A) 11/21/2016 1427   PROTEINUR 30 (A) 11/21/2016 1427   UROBILINOGEN 0.2 12/13/2015 1559   NITRITE NEGATIVE 11/21/2016 1427   LEUKOCYTESUR SMALL (A) 11/21/2016 1427   Sepsis  Labs: @LABRCNTIP (procalcitonin:4,lacticidven:4)  ) Recent Results (from the past 240 hour(s))  Urine culture     Status: None   Collection Time: 11/21/16  2:27 PM  Result Value Ref Range Status   Specimen Description URINE, CATHETERIZED  Final   Special Requests Normal  Final   Culture   Final    NO GROWTH Performed at [facility], [place] [address] [place], [place] [address]    Report Status 11/22/2016 FINAL  Final  Blood Culture (routine x 2)     Status: None (Preliminary result)   Collection Time: 11/21/16  3:28 PM  Result Value Ref Range Status   Specimen Description BLOOD LEFT WRIST  Final   Special Requests IN PEDIATRIC BOTTLE Blood Culture adequate volume  Final   Culture   Final    NO GROWTH 3 DAYS Performed at [facility], [place] [address] [place], [place] [address]  Report Status PENDING  Incomplete  Blood Culture (routine x 2)     Status: None (Preliminary result)   Collection Time: 11/21/16  3:39 PM  Result Value Ref Range Status   Specimen Description BLOOD LEFT WRIST  Final   Special Requests IN PEDIATRIC BOTTLE Blood Culture adequate volume  Final   Culture   Final    NO GROWTH 3 DAYS Performed at [facility], [address] [place], [place] [address]    Report Status PENDING  Incomplete  MRSA PCR Screening     Status: None   Collection Time: 11/21/16  6:36 PM  Result Value Ref Range Status   MRSA by PCR NEGATIVE NEGATIVE Final    Comment:        The GeneXpert MRSA Assay (FDA approved for NASAL specimens only), is one component of a comprehensive MRSA colonization surveillance program. It is not intended to diagnose MRSA infection nor to guide or monitor treatment for MRSA infections.          Radiology Studies: Dg Abd 1 View  Result Date: 11/25/2016 CLINICAL DATA:  Small bowel obstruction EXAM: ABDOMEN - 1 VIEW COMPARISON:  11/24/2016 FINDINGS: The NG tube has retracted with the tip in the distal esophagus. Continued  small bowel dilatation, not significantly changed. No free air organomegaly. IMPRESSION: Continue small bowel obstruction pattern. NG tube has been retracted with the tip now in the distal esophagus. These results will be called to the ordering clinician or representative by the Radiologist Assistant, and communication documented in the PACS or zVision Dashboard. Electronically Signed   By: Rolm Baptise M.D.   On: 11/25/2016 07:54   Dg Abd 1 View  Result Date: 11/24/2016 CLINICAL DATA:  Small bowel obstruction. EXAM: ABDOMEN - 1 VIEW COMPARISON:  CT 11/22/2016 FINDINGS: NG tube coils in the fundus of the stomach. Dilated small bowel loops again noted in the mid abdomen, not change since prior CT. Right nephrolithiasis noted. No free air. IMPRESSION: Continued small bowel obstruction pattern. NG tube coils in the fundus of the stomach. Electronically Signed   By: Rolm Baptise M.D.   On: 11/24/2016 09:56        Scheduled Meds:  Continuous Infusions: . dextrose 5 % and 0.45 % NaCl with KCl 40 mEq/L 75 mL/hr at 11/25/16 0755     LOS: 4 days    Time spent: 48min    Domenic Polite, MD [facility] Page via [email or web address], password [email or web address]  If 7PM-7AM, please contact night-coverage [email or web address] Password [facility] 11/25/2016, 11:44 AM

## 2016-11-25 NOTE — Progress Notes (Signed)
Discussed with Dr Broadus John. Since family isn't interested in surgery which is completely reasonable in this 83yo with dementia and other health issues we will signoff. pls call with questions.   Russell [name]. Redmond Pulling, MD, FACS General, Bariatric, & Minimally Invasive Surgery [facility], [place]

## 2016-11-26 ENCOUNTER — Telehealth: Payer: Self-pay | Admitting: *Deleted

## 2016-11-26 LAB — CULTURE, BLOOD (ROUTINE X 2)
CULTURE: NO GROWTH
Culture: NO GROWTH
SPECIAL REQUESTS: ADEQUATE
SPECIAL REQUESTS: ADEQUATE

## 2016-11-26 NOTE — Telephone Encounter (Signed)
Pt was on TCM list admitted 11/21/16 for Sepsis due to pneumonia  And SBO. I had a family meeting and recommended Comfort care to extended family and daughters at bedside today, they are agreeable, we removed the NG tube and Hospice Liaison discussed [name] place with family and they are agreeable. Pt d/c ton 11/25/16 to [facility] place.Marland KitchenJohny [name]

## 2016-12-25 ENCOUNTER — Ambulatory Visit: Payer: PPO | Admitting: Podiatry

## 2016-12-29 DEATH — deceased

## 2018-03-01 IMAGING — CT CT HEAD W/O CM
4 series · 16 of 47 positions shown, 18 images · non-contrast
Comparison: 03/08/2005

CLINICAL DATA: Generalized weakness since yesterday.

EXAM:
CT HEAD WITHOUT CONTRAST
TECHNIQUE: Contiguous axial images were obtained from the base of the skull
through the vertex without intravenous contrast.

[Series 2: head without · axial · non-contrast · 0.46mm/px · z∈[-122,-17]mm · 7 of 29 slices shown, 9 images]
[im 4/29  brain]
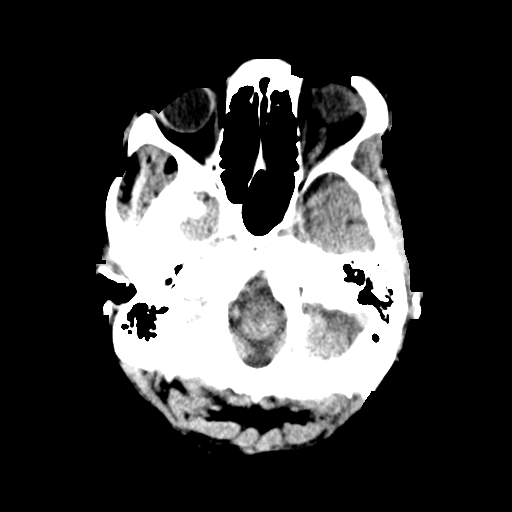
[im 4/29  bone]
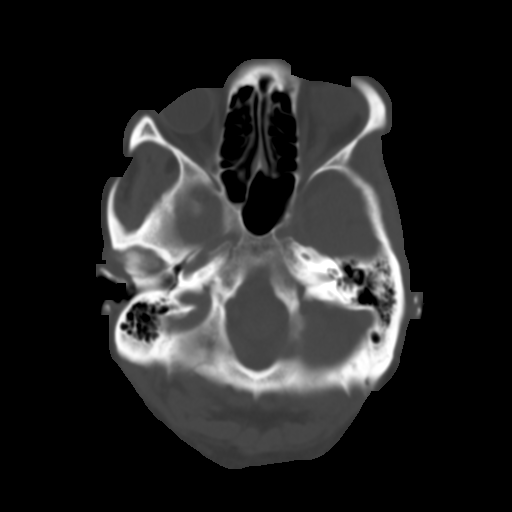
[im 8/29  brain]
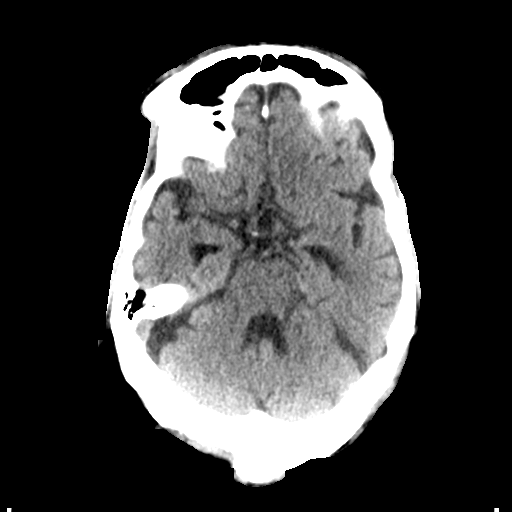
[im 11/29  brain]
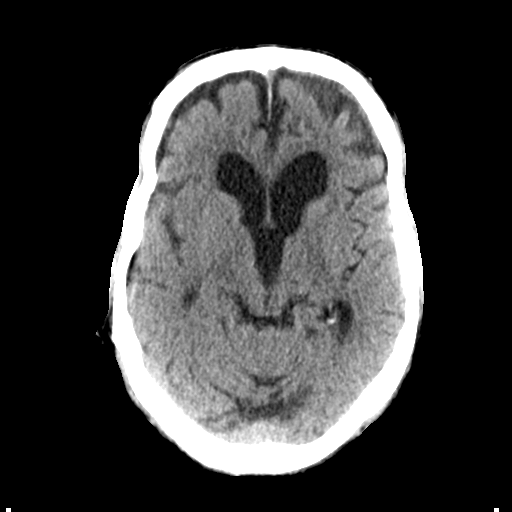
[im 15/29  brain]
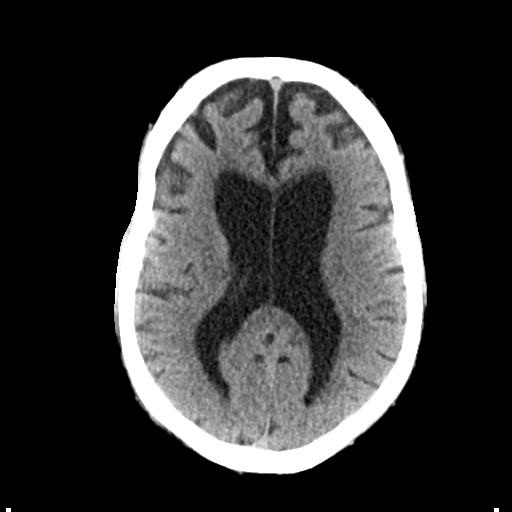
[im 18/29  brain]
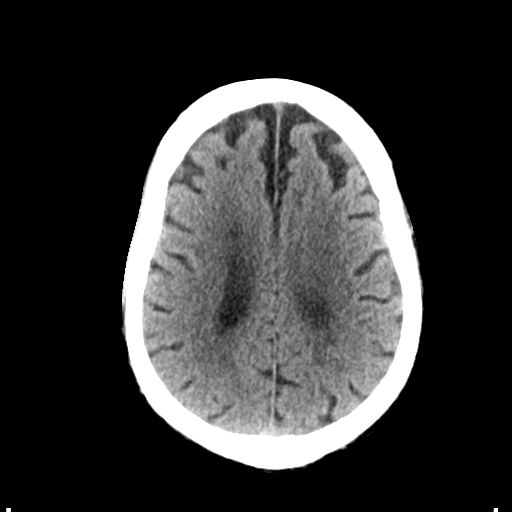
[im 18/29  bone]
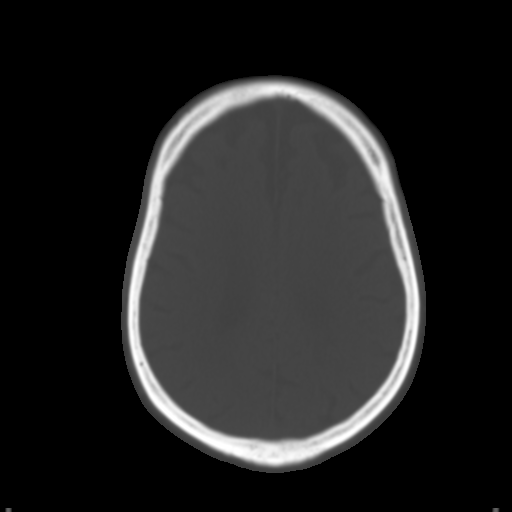
[im 22/29  brain]
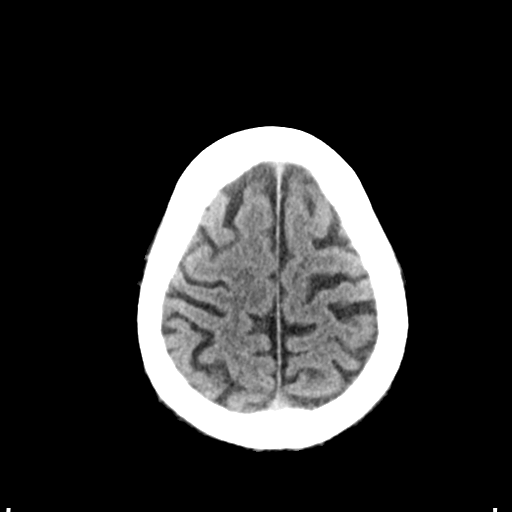
[im 25/29  brain]
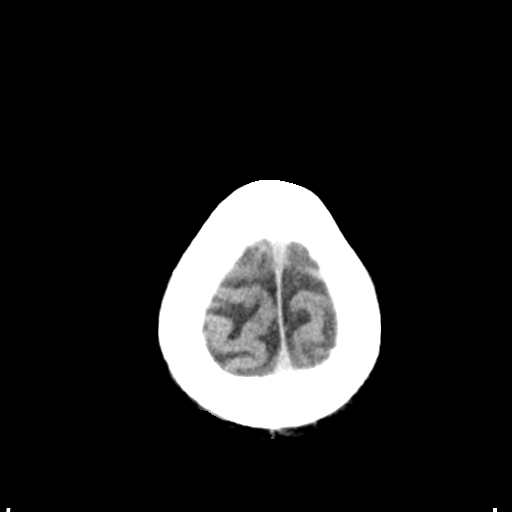

[Series 3: head bone · axial · 0.46mm/px · z∈[-123,-95]mm · 3 of 71 slices shown]
[im 8/71  bone]
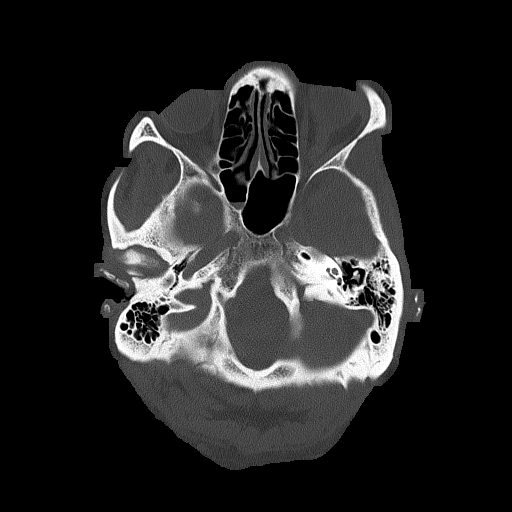
[im 15/71  bone]
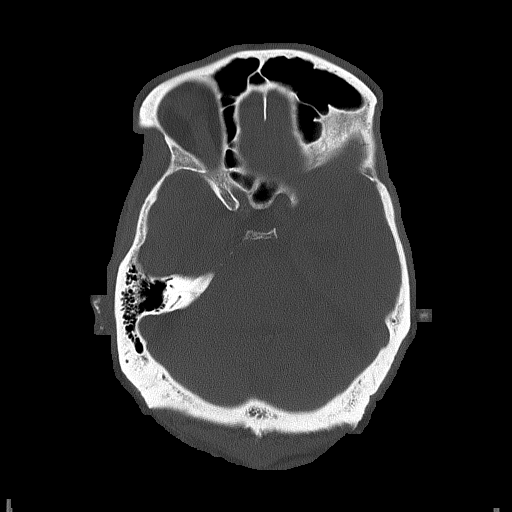
[im 22/71  bone]
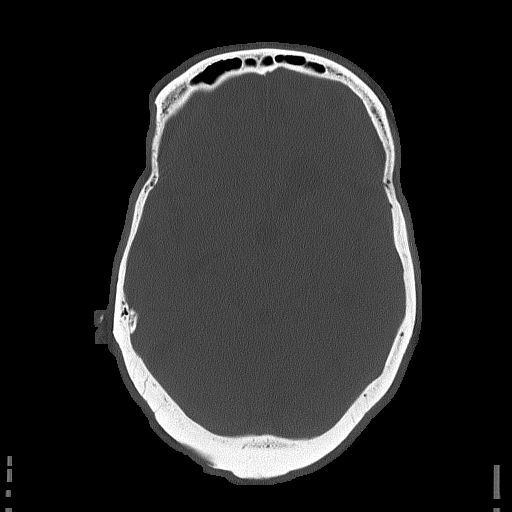

[Series 4: head without cor · coronal · non-contrast · 0.30mm/px · 3 of 65 slices shown]
[im 22/65  brain]
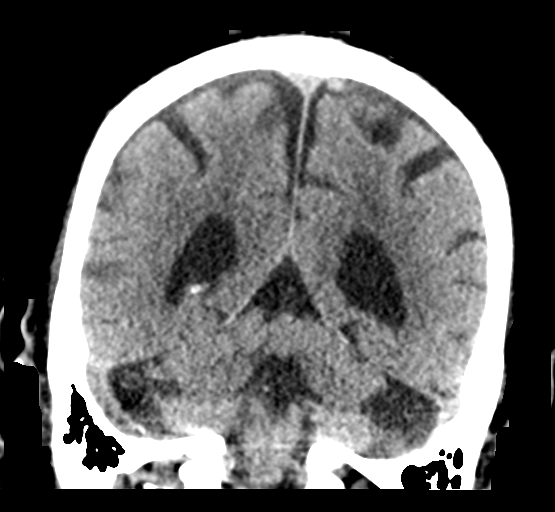
[im 29/65  brain]
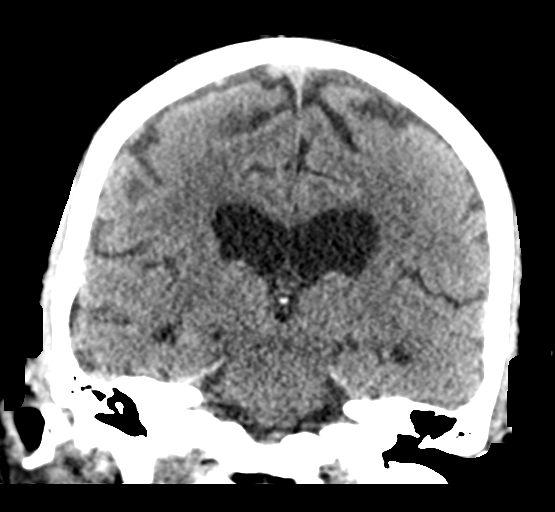
[im 36/65  brain]
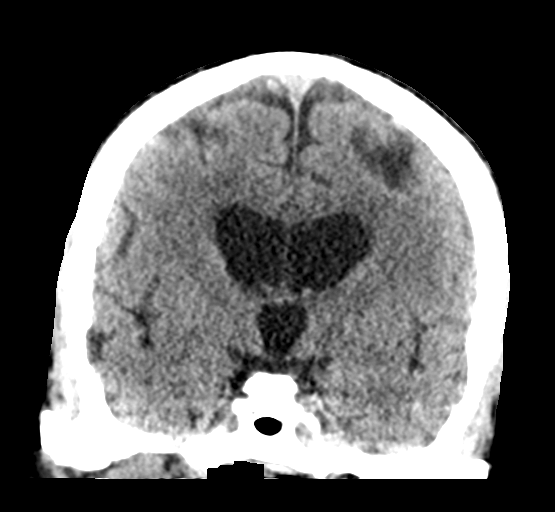

[Series 5: head without sag · sagittal · non-contrast · 0.30mm/px · 3 of 67 slices shown]
[im 23/67  brain]
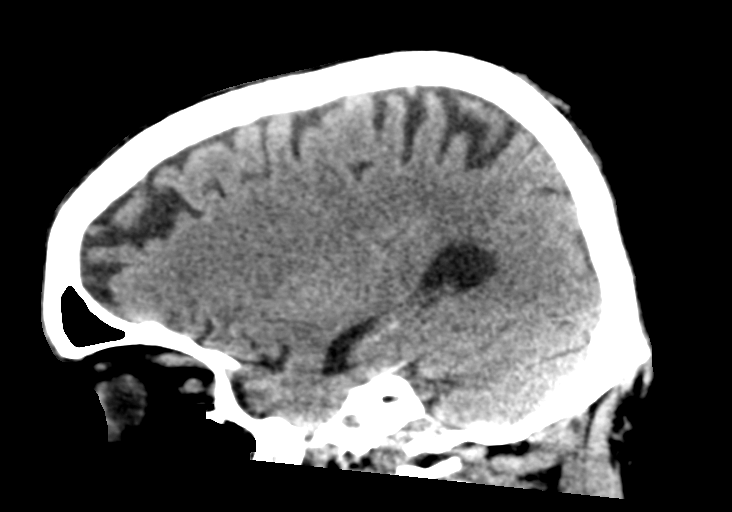
[im 34/67  brain]
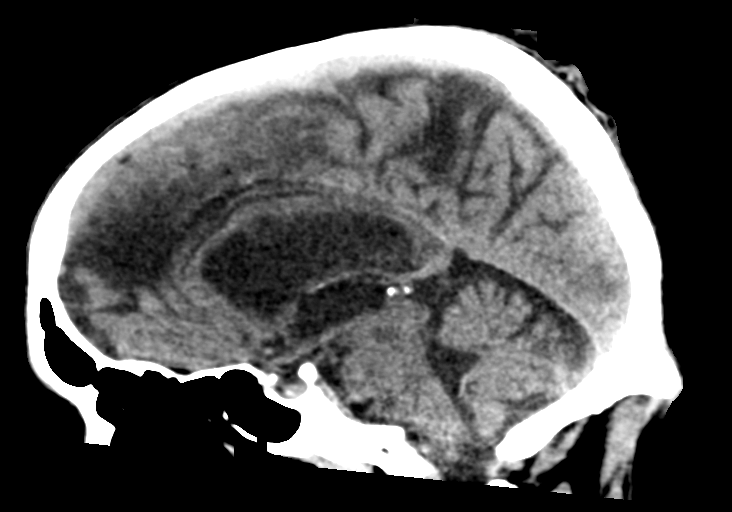
[im 45/67  brain]
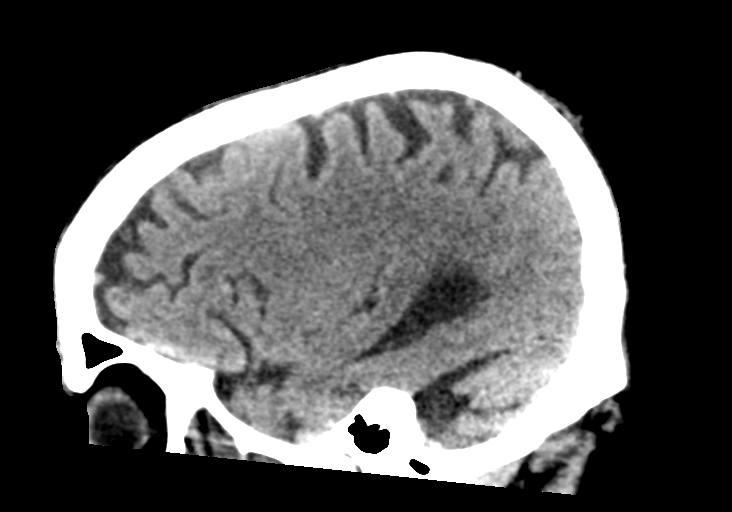

[16 of 47 positions shown; findings below may reference images not displayed]

FINDINGS: There is no evidence for acute hemorrhage, hydrocephalus, mass
lesion, or abnormal extra-axial fluid collection. No definite CT
evidence for acute infarction. Diffuse loss of parenchymal volume is
consistent with atrophy. Patchy low attenuation in the deep
hemispheric and periventricular white matter is nonspecific, but
likely reflects chronic microvascular ischemic demyelination. The
visualized paranasal sinuses and mastoid air cells are clear.
IMPRESSION: 1. Stable.  No acute intracranial abnormality.
2. Atrophy with chronic small vessel white matter ischemic
demyelination.

## 2018-04-09 IMAGING — CR DG CHEST 1V PORT
1 series · 1 of 1 positions shown · non-contrast
Comparison: [id number]

CLINICAL DATA: Worsening shortness of breath and wheezing this
evening.

EXAM:
PORTABLE CHEST 1 VIEW

[AP]
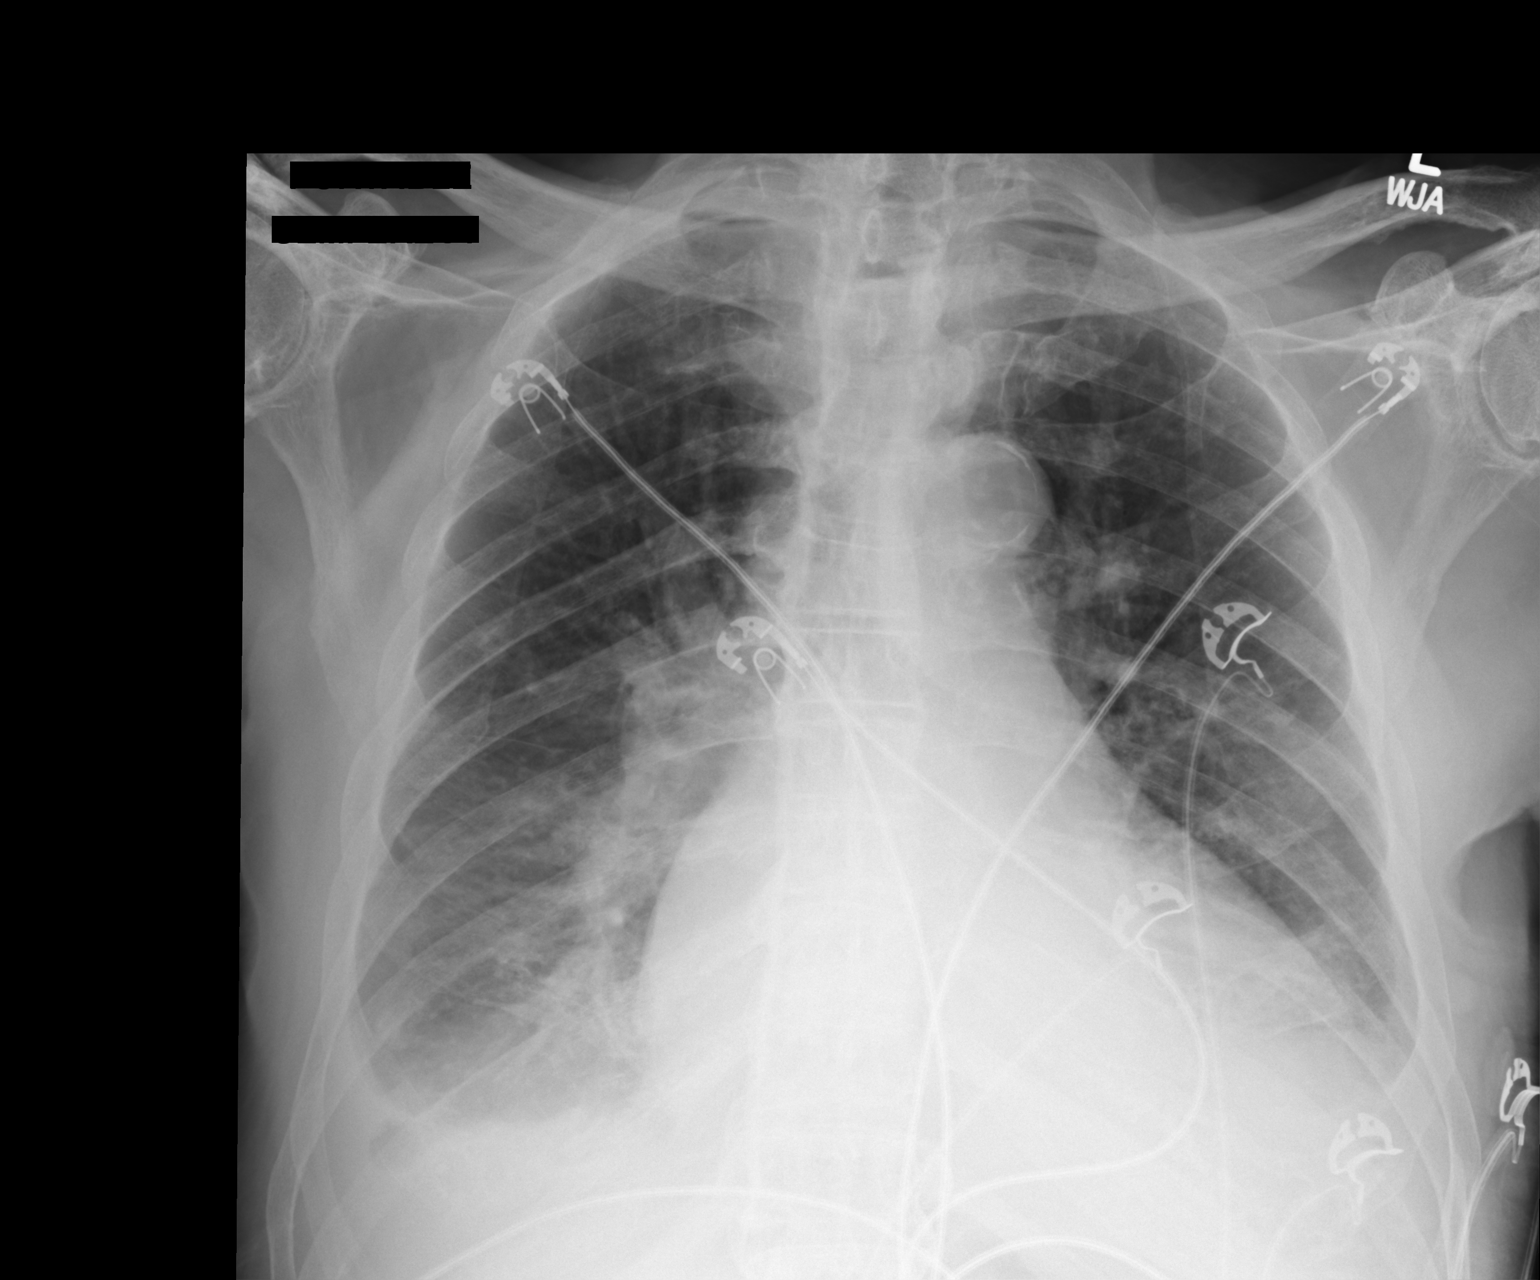

[1 of 1 positions shown; findings below may reference images not displayed]

FINDINGS: Cardiac enlargement. Pulmonary vascularity is prominent. Bilateral
pleural effusions with basilar atelectasis or infiltration. Changes
demonstrate mild improvement since previous study. No pneumothorax.
Calcification of the aorta. Degenerative changes in the shoulders
IMPRESSION: Cardiac enlargement with pulmonary vascular congestion, bilateral
pleural effusions, and basilar atelectasis or infiltration. There is
some improvement since previous study.
